# Patient Record
Sex: Female | Born: 1970 | Race: White | Hispanic: No | Marital: Married | Smoking: Never smoker
Health system: Southern US, Community
[De-identification: ages and names within clinical notes are randomized; demographics above are authoritative.]

## PROBLEM LIST (undated history)

## (undated) ENCOUNTER — Ambulatory Visit: Discharge status: Absent without leave | Payer: BC Managed Care – PPO

## (undated) DIAGNOSIS — E162 Hypoglycemia, unspecified: Secondary | ICD-10-CM

## (undated) DIAGNOSIS — E785 Hyperlipidemia, unspecified: Secondary | ICD-10-CM

## (undated) DIAGNOSIS — G4733 Obstructive sleep apnea (adult) (pediatric): Secondary | ICD-10-CM

## (undated) DIAGNOSIS — K219 Gastro-esophageal reflux disease without esophagitis: Secondary | ICD-10-CM

## (undated) DIAGNOSIS — D649 Anemia, unspecified: Secondary | ICD-10-CM

## (undated) DIAGNOSIS — Z9289 Personal history of other medical treatment: Secondary | ICD-10-CM

## (undated) DIAGNOSIS — F319 Bipolar disorder, unspecified: Secondary | ICD-10-CM

## (undated) DIAGNOSIS — F32A Depression, unspecified: Secondary | ICD-10-CM

## (undated) DIAGNOSIS — F419 Anxiety disorder, unspecified: Secondary | ICD-10-CM

## (undated) DIAGNOSIS — I10 Essential (primary) hypertension: Secondary | ICD-10-CM

## (undated) DIAGNOSIS — N611 Abscess of the breast and nipple: Secondary | ICD-10-CM

## (undated) DIAGNOSIS — F329 Major depressive disorder, single episode, unspecified: Secondary | ICD-10-CM

## (undated) DIAGNOSIS — F988 Other specified behavioral and emotional disorders with onset usually occurring in childhood and adolescence: Secondary | ICD-10-CM

## (undated) HISTORY — DX: Obstructive sleep apnea (adult) (pediatric): G47.33

## (undated) HISTORY — PX: ABDOMINOPLASTY: SUR9

## (undated) HISTORY — DX: Anxiety disorder, unspecified: F41.9

## (undated) HISTORY — PX: COLON SURGERY: SHX602

## (undated) HISTORY — PX: TUBAL LIGATION: SHX77

## (undated) HISTORY — PX: ABDOMINAL HYSTERECTOMY: SHX81

## (undated) HISTORY — DX: Essential (primary) hypertension: I10

## (undated) HISTORY — DX: Depression, unspecified: F32.A

## (undated) HISTORY — PX: WISDOM TOOTH EXTRACTION: SHX21

## (undated) HISTORY — DX: Bipolar disorder, unspecified: F31.9

## (undated) HISTORY — DX: Other specified behavioral and emotional disorders with onset usually occurring in childhood and adolescence: F98.8

## (undated) HISTORY — DX: Major depressive disorder, single episode, unspecified: F32.9

## (undated) HISTORY — DX: Hyperlipidemia, unspecified: E78.5

---

## 2001-06-28 ENCOUNTER — Emergency Department (HOSPITAL_COMMUNITY): Admission: EM | Admit: 2001-06-28 | Discharge: 2001-06-28 | Payer: Self-pay | Admitting: Emergency Medicine

## 2002-03-08 ENCOUNTER — Encounter: Payer: Self-pay | Admitting: Emergency Medicine

## 2002-03-08 ENCOUNTER — Emergency Department (HOSPITAL_COMMUNITY): Admission: EM | Admit: 2002-03-08 | Discharge: 2002-03-08 | Payer: Self-pay | Admitting: Emergency Medicine

## 2002-03-10 ENCOUNTER — Emergency Department (HOSPITAL_COMMUNITY): Admission: EM | Admit: 2002-03-10 | Discharge: 2002-03-10 | Payer: Self-pay | Admitting: Emergency Medicine

## 2002-03-21 ENCOUNTER — Ambulatory Visit (HOSPITAL_COMMUNITY): Admission: RE | Admit: 2002-03-21 | Discharge: 2002-03-21 | Payer: Self-pay | Admitting: Family Medicine

## 2003-04-11 ENCOUNTER — Emergency Department (HOSPITAL_COMMUNITY): Admission: EM | Admit: 2003-04-11 | Discharge: 2003-04-11 | Payer: Self-pay | Admitting: *Deleted

## 2003-04-11 ENCOUNTER — Encounter: Payer: Self-pay | Admitting: *Deleted

## 2003-05-25 ENCOUNTER — Inpatient Hospital Stay (HOSPITAL_COMMUNITY): Admission: EM | Admit: 2003-05-25 | Discharge: 2003-05-27 | Payer: Self-pay | Admitting: Emergency Medicine

## 2003-05-25 ENCOUNTER — Encounter: Payer: Self-pay | Admitting: Emergency Medicine

## 2005-08-24 IMAGING — CR DG CHEST 1V PORT
1 series · 2 of 2 positions shown · non-contrast
Comparison: None available.

CLINICAL DATA: 33-year-old with chest pain.  
 PORTABLE CHEST - 1 VIEW [DATE]:

[Series 1001: view not recorded · 0.20mm/px · 2 of 2 slices shown]
[im 1/2]
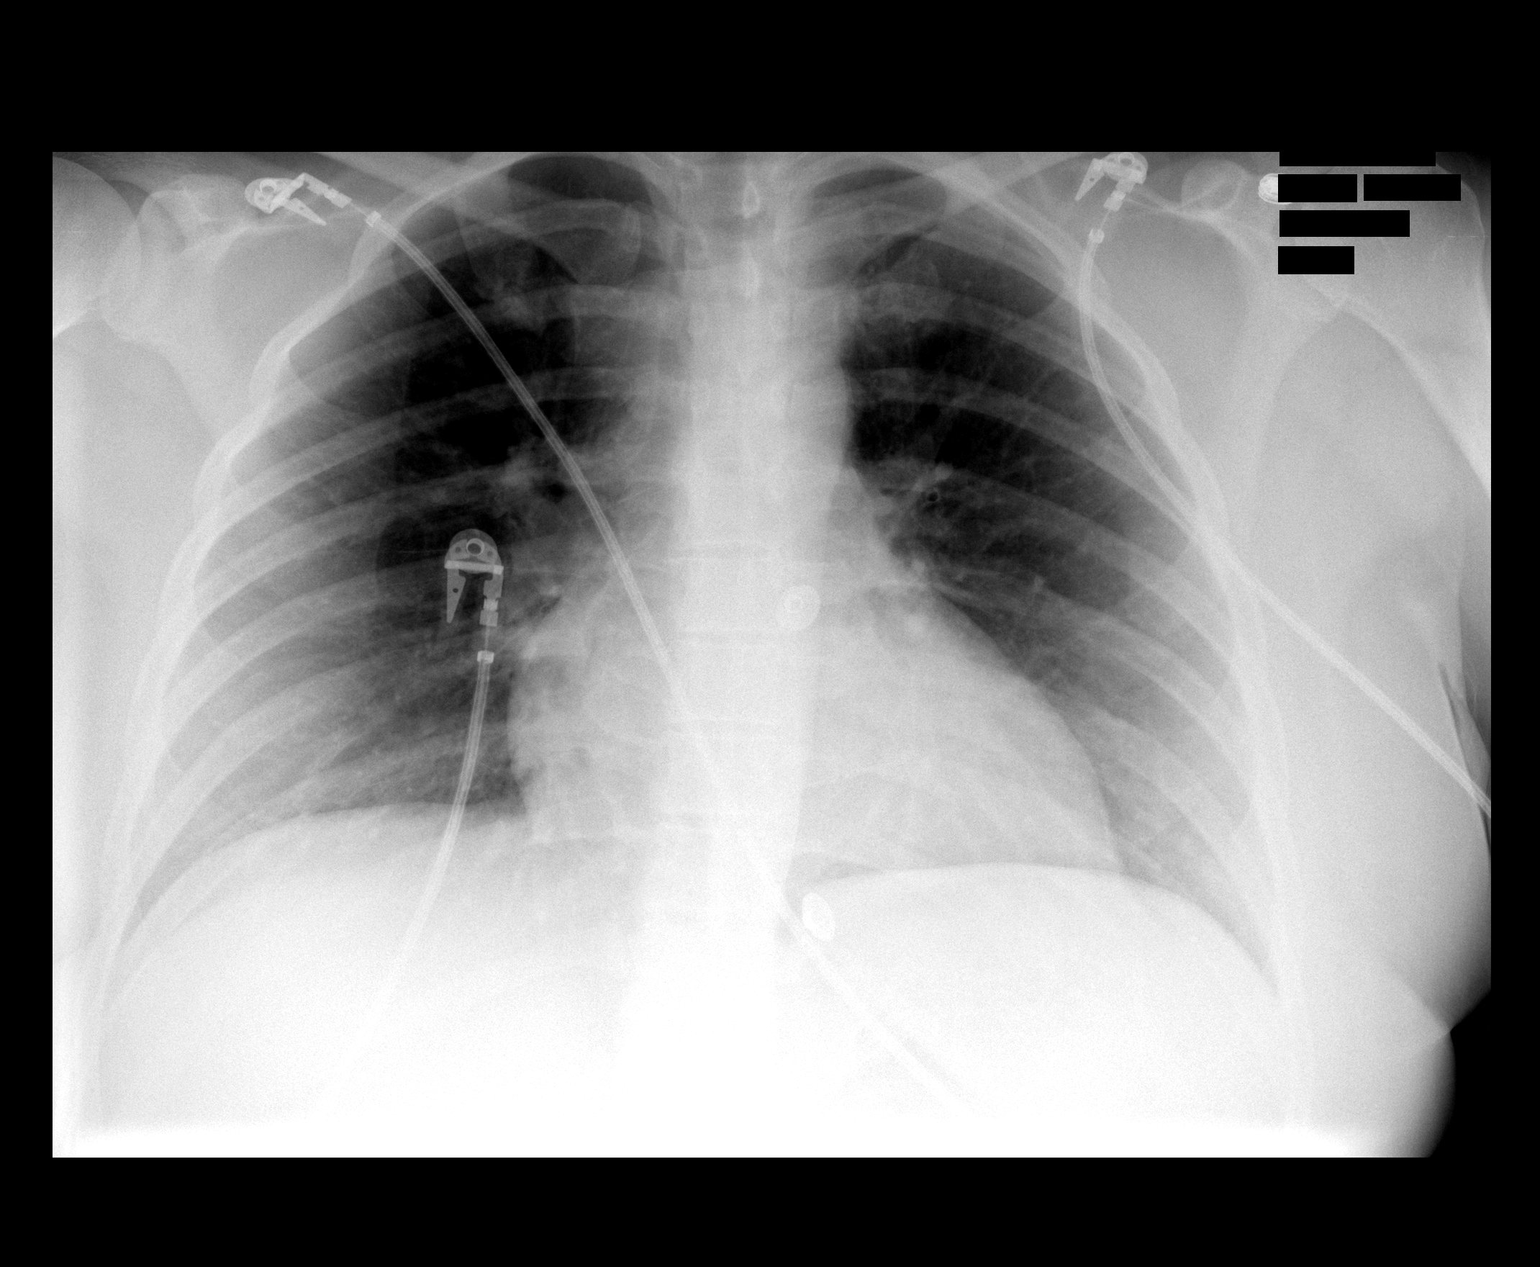
[im 2/2]
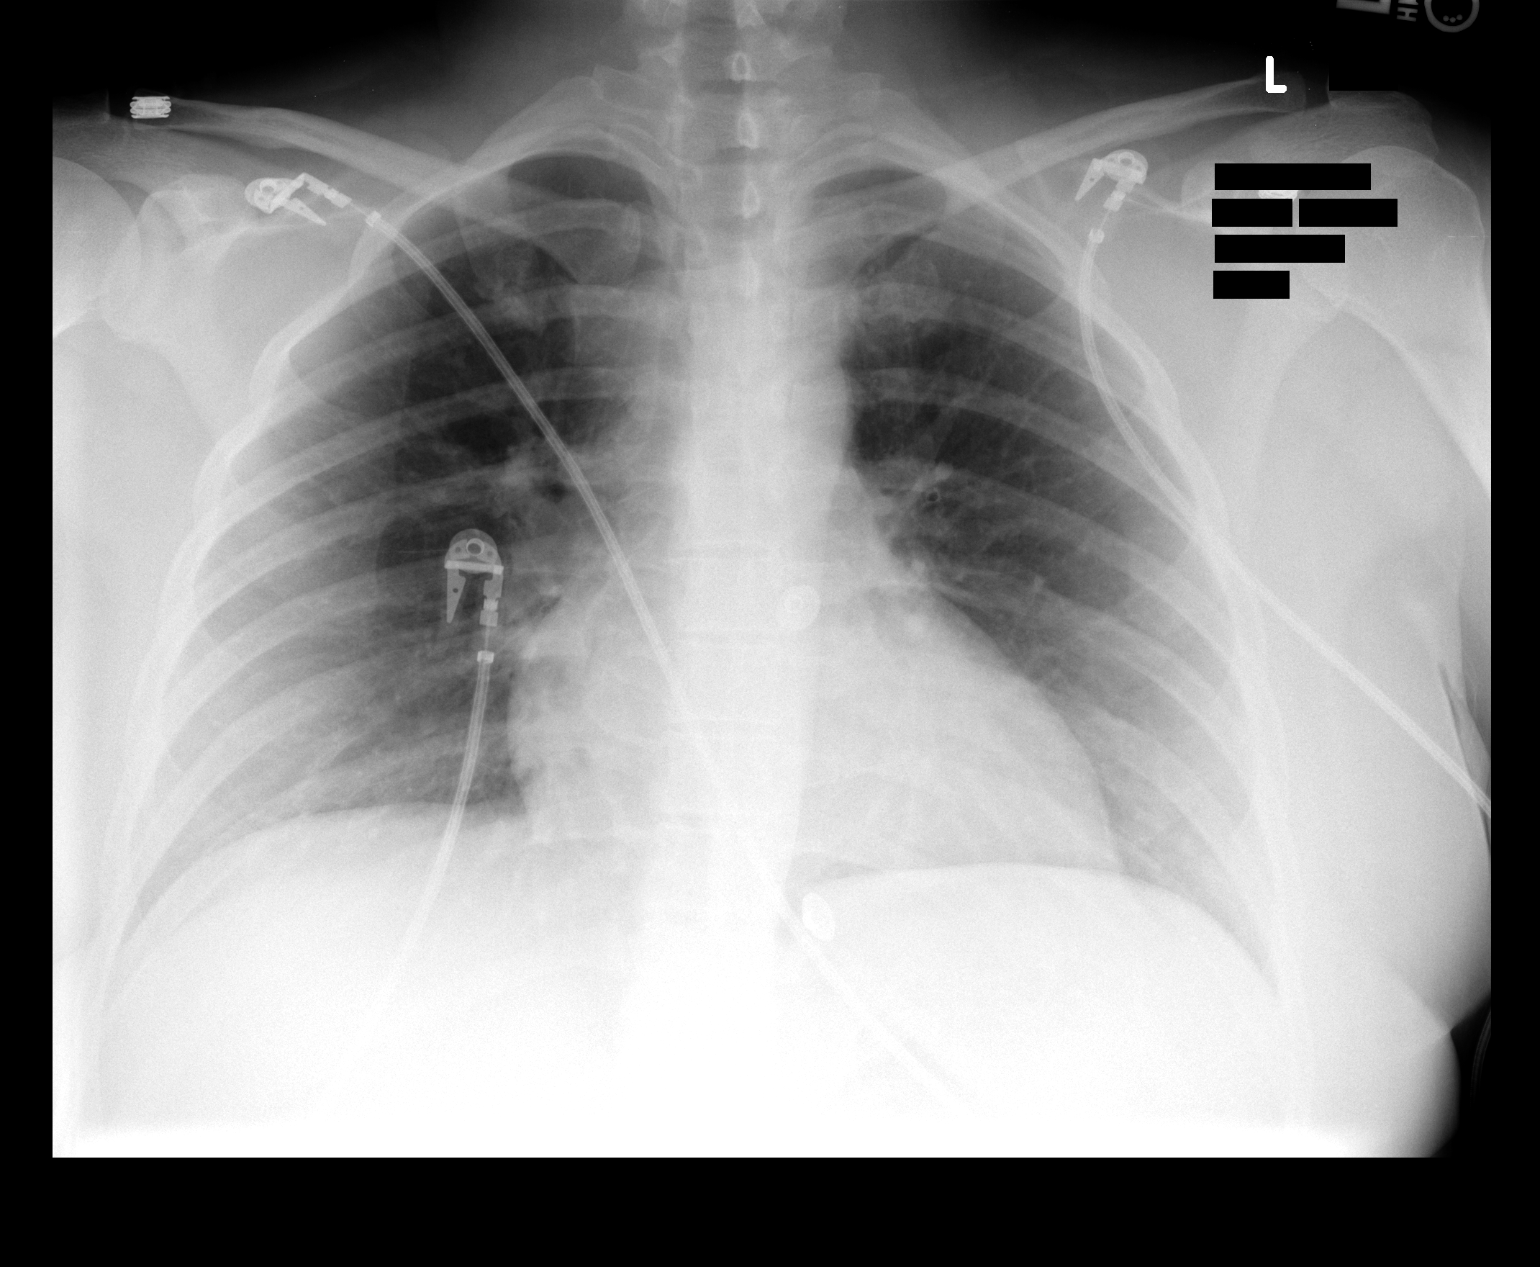

[2 of 2 positions shown; findings below may reference images not displayed]

The heart size and mediastinal contours are within normal limits.  Both lungs are clear.
IMPRESSION: No acute findings.

## 2005-08-25 ENCOUNTER — Observation Stay (HOSPITAL_COMMUNITY): Admission: EM | Admit: 2005-08-25 | Discharge: 2005-08-25 | Payer: Self-pay | Admitting: Emergency Medicine

## 2005-08-25 IMAGING — CT CT EXTREM LOW BILAT W/ CM
3 of 8 series · 15 of 33 positions shown, 18 images · IV contrast (omnipaque)
Comparison: none

CLINICAL DATA: Acute onset chest pain and shortness of breath.  Evaluate for pulmonary embolism and lower extremity DVT. 
 CT ANGIOGRAPHY OF CHEST:
TECHNIQUE: Multidetector CT imaging of the chest was performed during bolus injection of intravenous contrast.  Multiplanar CT angiographic image reconstructions were generated to evaluate the vascular anatomy.
 Contrast:  120 cc Omnipaque 300.
 Satisfactory opacification of the pulmonary arteries is seen and there is no evidence of pulmonary embolism.  There is no evidence of thoracic aortic aneurysm or dissection.  There is no evidence of hilar or mediastinal masses or adenopathy within the thorax. 
 There is no evidence of pleural effusion.  Minimal atelectasis is seen in the dependent lung bases but lungs are otherwise clear.
TECHNIQUE: Delayed CT images of the lower extremities were obtained after intravenous contrast administration from the level of the popliteal veins through the lower IVC, according to the protocol for evaluation for deep venous thrombosis.
 Satisfactory opacification of the deep veins is seen, and there is no evidence of acute DVT.

[Series 3: pe · axial · 0.70mm/px · z∈[-294,-90]mm · 7 of 437 slices shown, 9 images]
[im 55/437  soft-tissue]
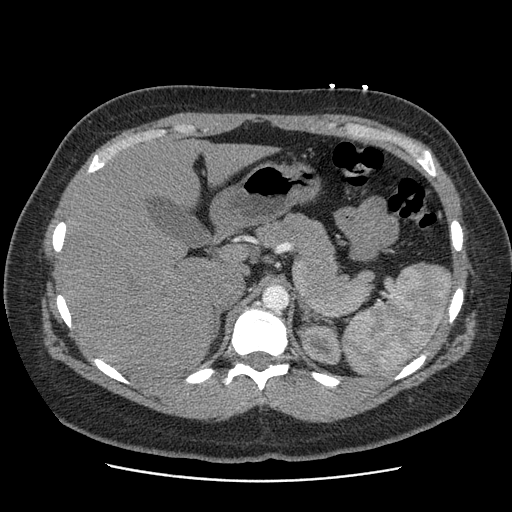
[im 55/437  bone]
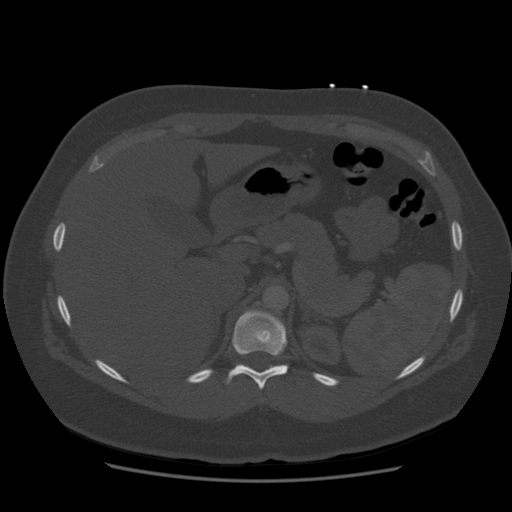
[im 110/437  bone]
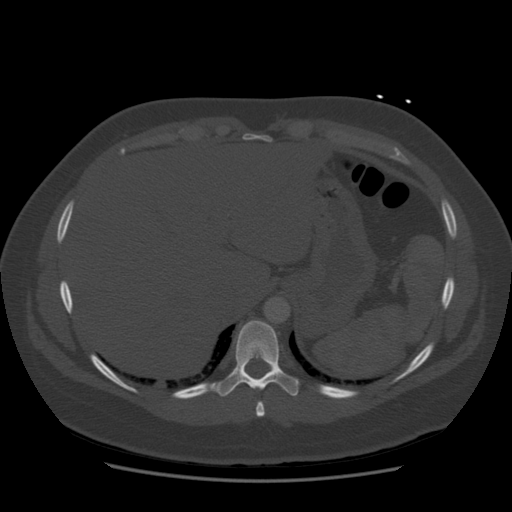
[im 164/437  bone]
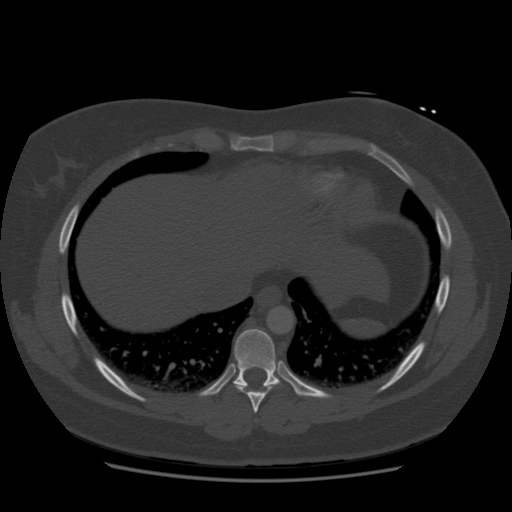
[im 219/437  bone]
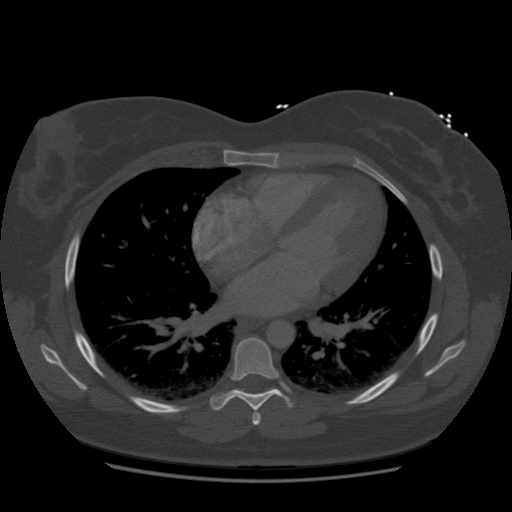
[im 273/437  soft-tissue]
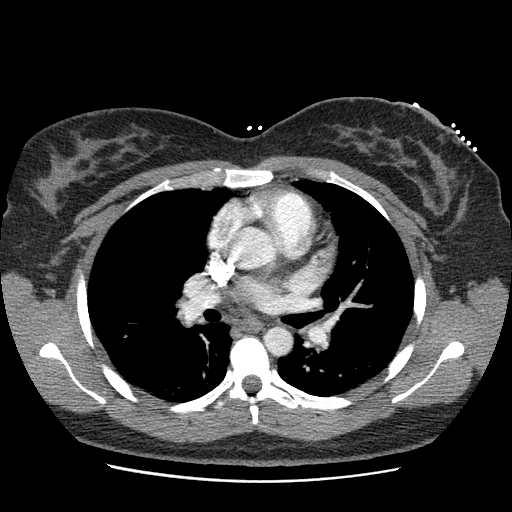
[im 273/437  bone]
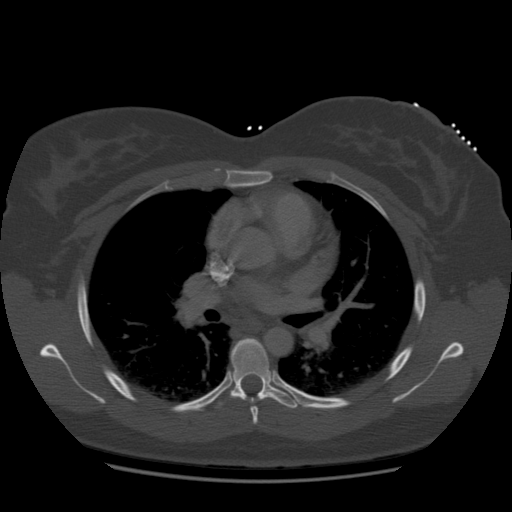
[im 328/437  bone]
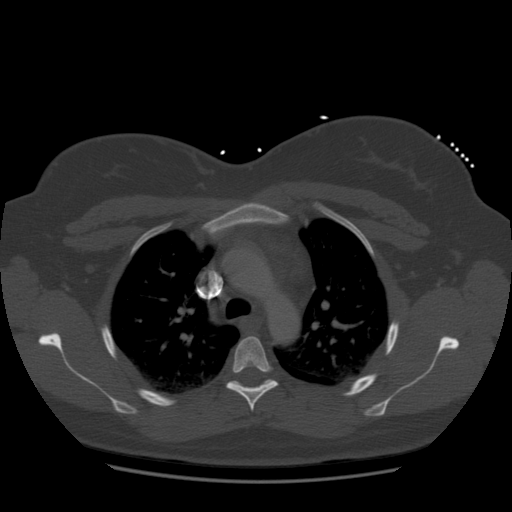
[im 382/437  bone]
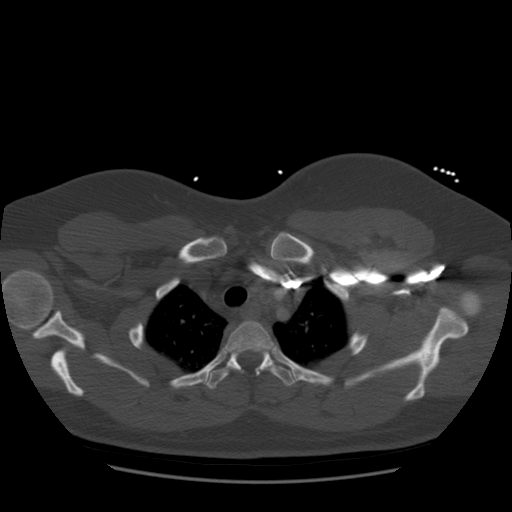

[Series 300: reformatted · coronal · 0.47mm/px · 3 of 128 slices shown (1 of 2)]
[im 81/128  bone]
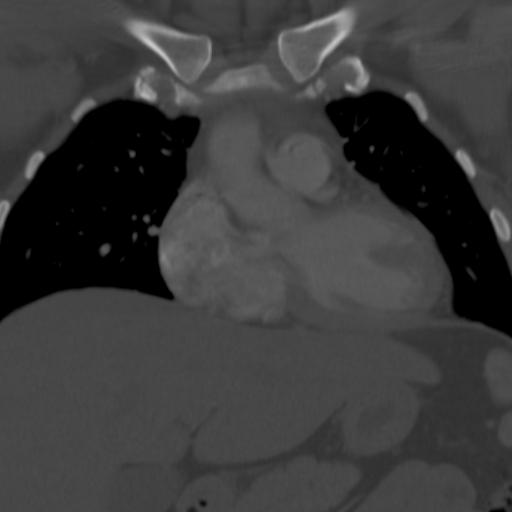
[im 92/128  bone]
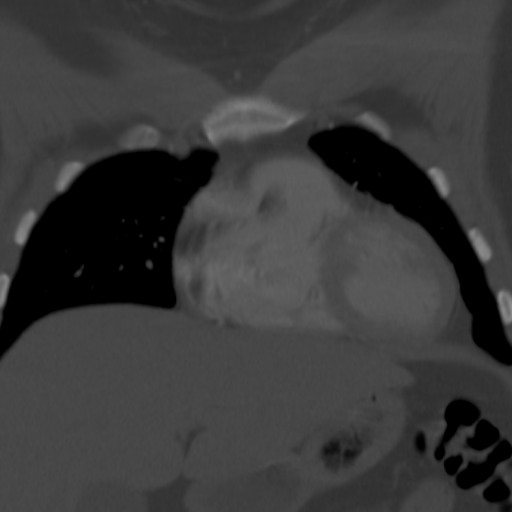
[im 104/128  bone]
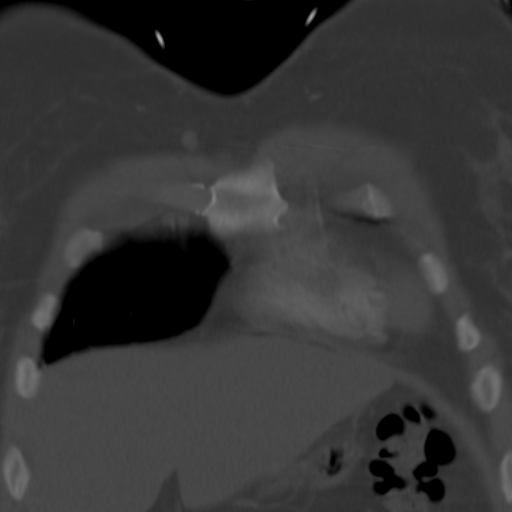

[Series 301: reformatted · sagittal · 0.47mm/px · 5 of 152 slices shown, 6 images (2 of 2)]
[im 51/152  bone]
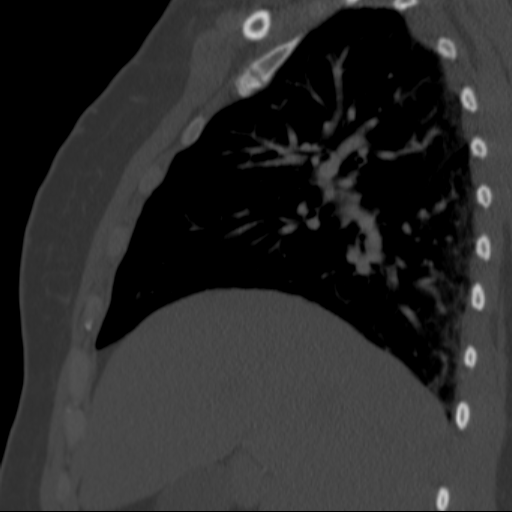
[im 63/152  bone]
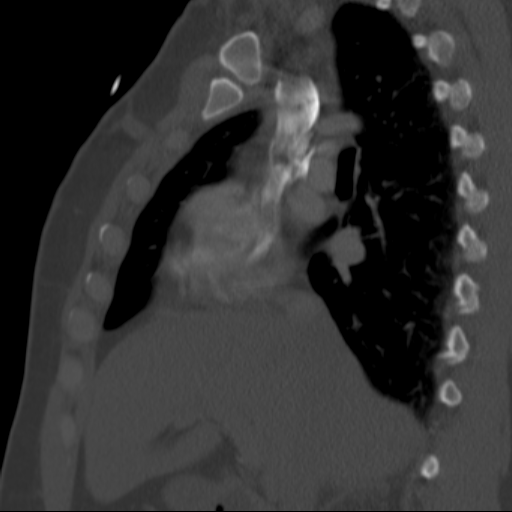
[im 76/152  soft-tissue]
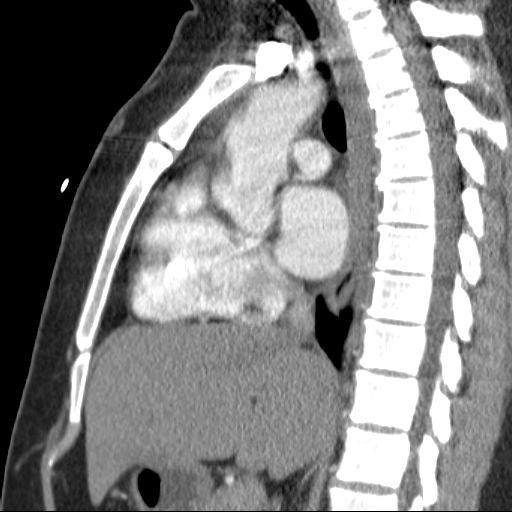
[im 76/152  bone]
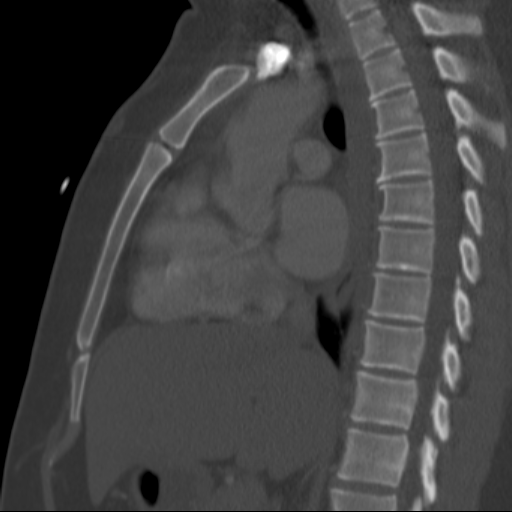
[im 89/152  bone]
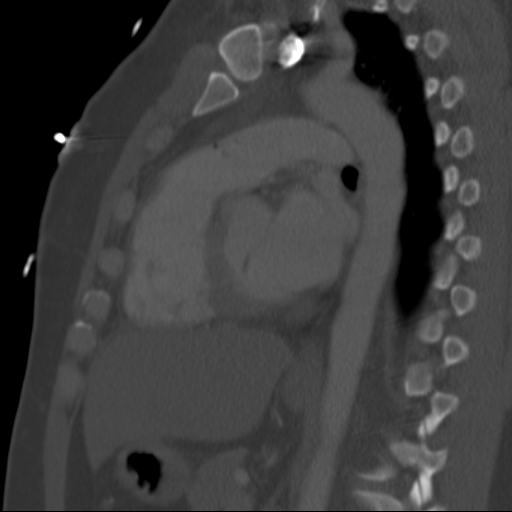
[im 101/152  bone]
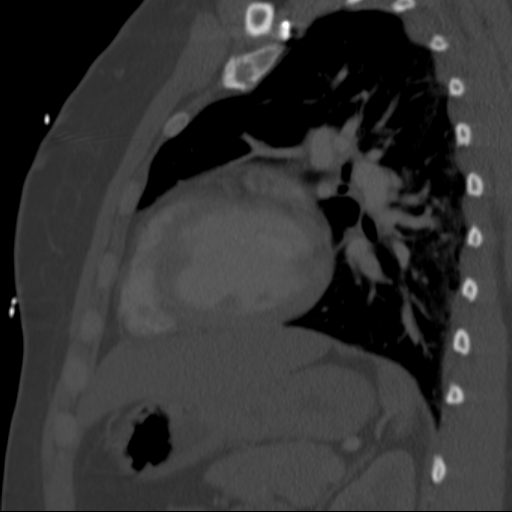

[15 of 33 positions shown; findings below may reference images not displayed]

IMPRESSION: Negative chest CTA.  No evidence of pulmonary embolism or other acute disease. 
 CT OF THE LOWER EXTREMITIES WITH CONTRAST - DVT PROTOCOL:
IMPRESSION: No evidence of acute DVT.

## 2005-12-13 ENCOUNTER — Emergency Department (HOSPITAL_COMMUNITY): Admission: EM | Admit: 2005-12-13 | Discharge: 2005-12-13 | Payer: Self-pay | Admitting: Emergency Medicine

## 2006-08-14 ENCOUNTER — Emergency Department (HOSPITAL_COMMUNITY): Admission: EM | Admit: 2006-08-14 | Discharge: 2006-08-14 | Payer: Self-pay | Admitting: Emergency Medicine

## 2006-08-31 ENCOUNTER — Inpatient Hospital Stay (HOSPITAL_COMMUNITY): Admission: AC | Admit: 2006-08-31 | Discharge: 2006-09-01 | Payer: Self-pay

## 2006-09-03 ENCOUNTER — Emergency Department (HOSPITAL_COMMUNITY): Admission: EM | Admit: 2006-09-03 | Discharge: 2006-09-03 | Payer: Self-pay | Admitting: Emergency Medicine
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
## 2007-11-08 ENCOUNTER — Emergency Department (HOSPITAL_COMMUNITY): Admission: EM | Admit: 2007-11-08 | Discharge: 2007-11-08 | Payer: Self-pay | Admitting: Emergency Medicine

## 2007-11-08 IMAGING — CR DG CERVICAL SPINE COMPLETE 4+V
7 series · 7 of 7 positions shown · non-contrast
Comparison: none

CLINICAL DATA: Back and neck pain. 
 CERVICAL SPINE SERIES ? 7 VIEW:

[view not recorded (1 of 7)]
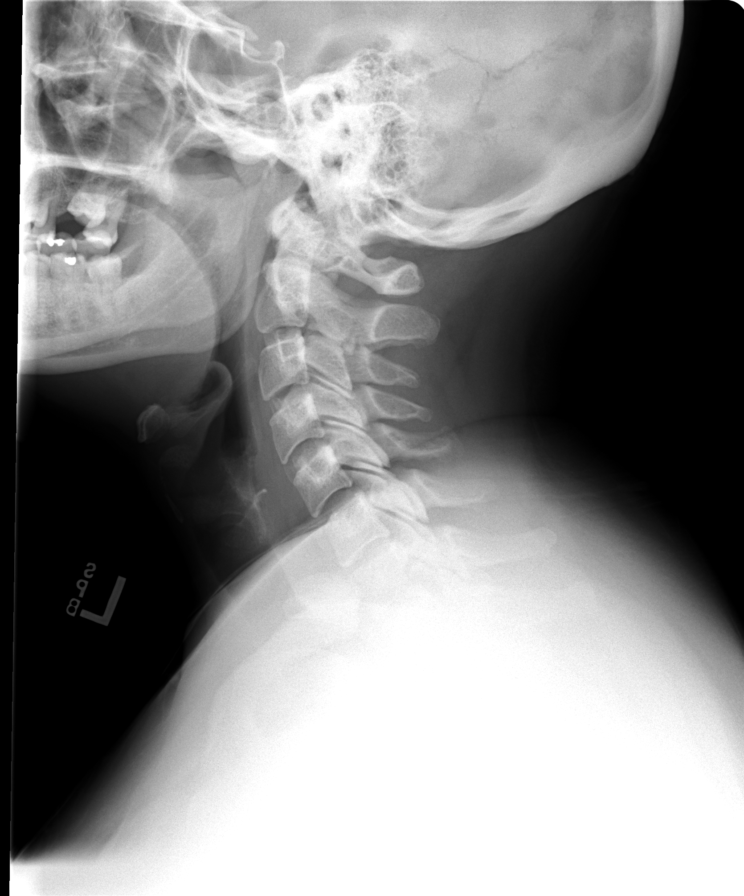

[view not recorded (2 of 7)]
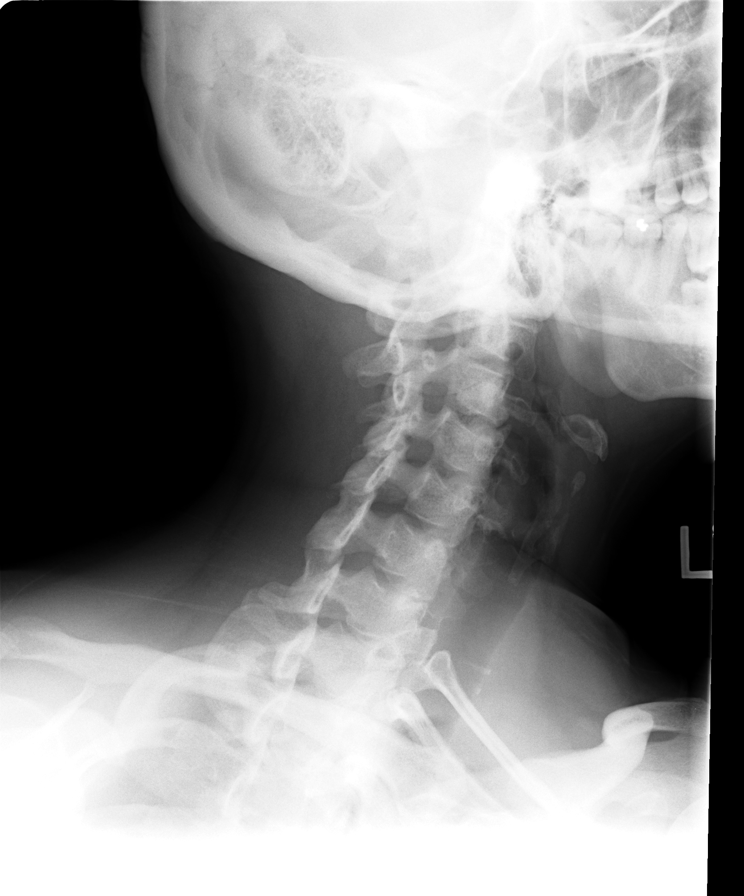

[view not recorded (3 of 7)]
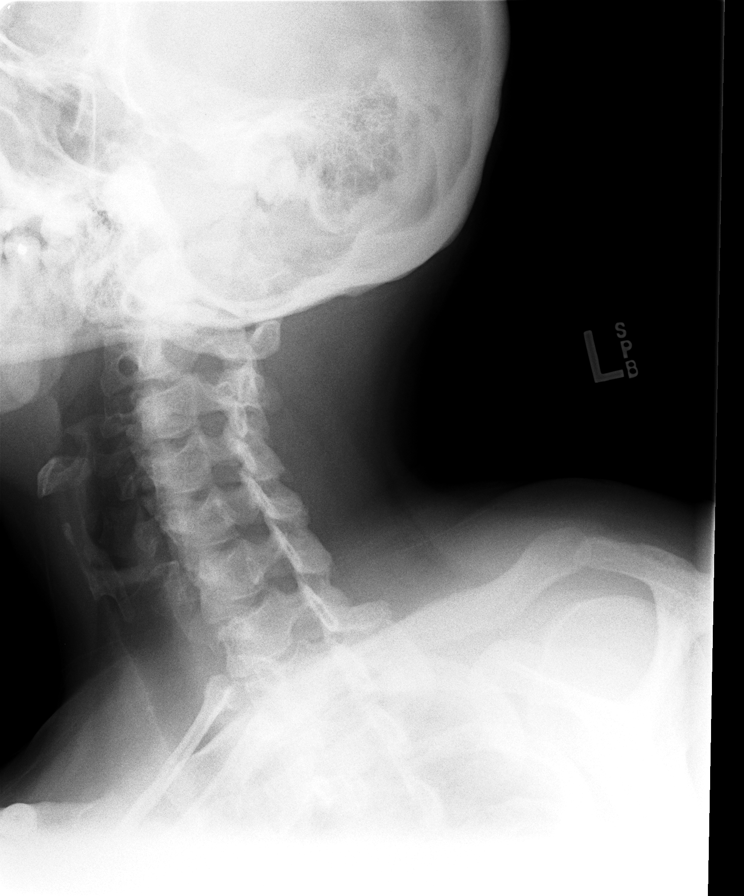

[view not recorded (4 of 7)]
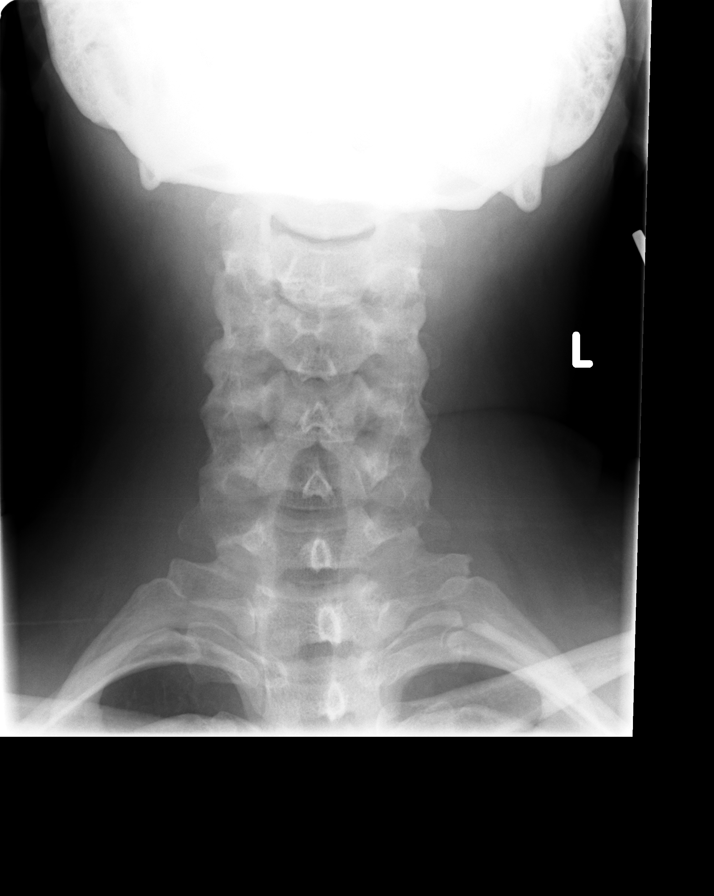

[view not recorded (5 of 7)]
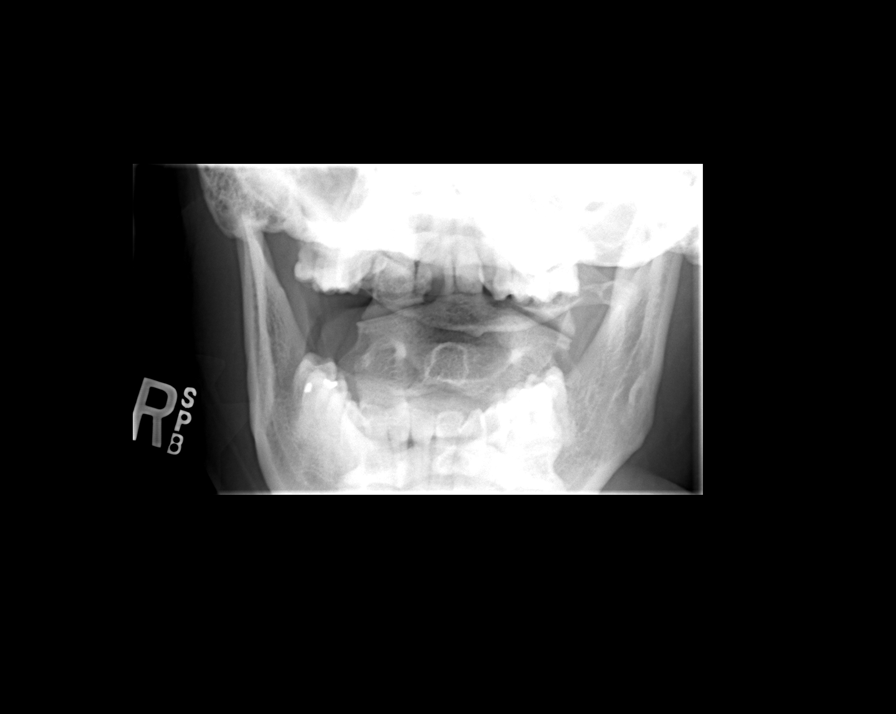

[view not recorded (6 of 7)]
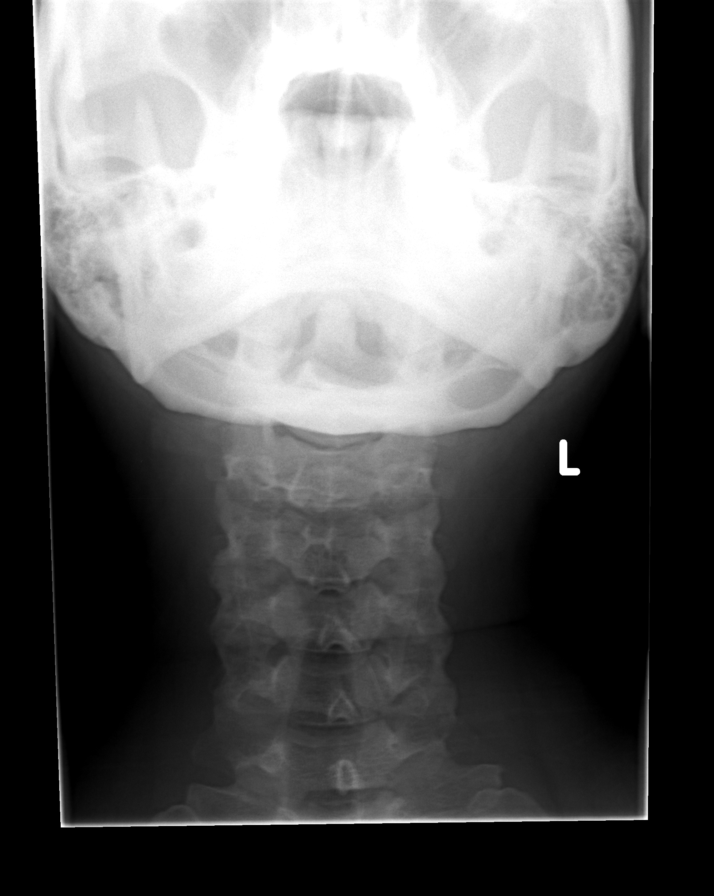

[view not recorded (7 of 7)]
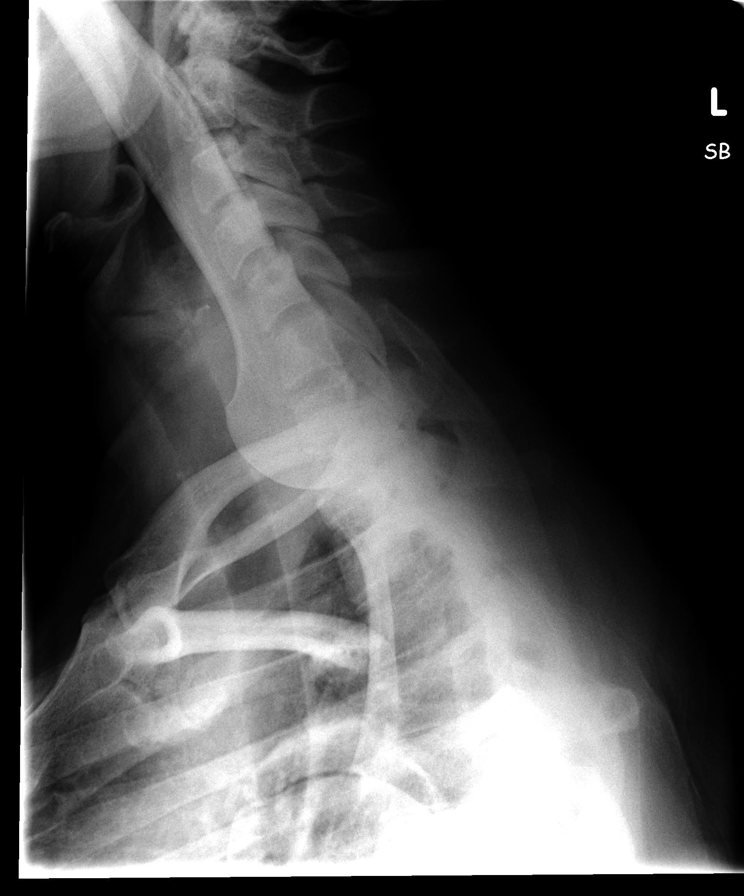

[7 of 7 positions shown; findings below may reference images not displayed]

FINDINGS: Vertebral body height and alignment are normal.  Prevertebral soft tissues are normal.  Lung apices are clear.
IMPRESSION: Negative study.

## 2011-01-25 NOTE — H&P (Signed)
NAMEMarland Kitchen  Theresa [name], Theresa [name] NO.:  [id number]   MEDICAL RECORD NO.:  [id number]          PATIENT TYPE:  EMS   LOCATION:  MAJO                         FACILITY:  [facility]   PHYSICIAN:  Verner Chol, MD DATE OF BIRTH:  [birth year]   DATE OF ADMISSION:  08/24/2005  DATE OF DISCHARGE:                                HISTORY & PHYSICAL   CHIEF COMPLAINT:  Chest pain.   HISTORY OF PRESENTING ILLNESS:  This is a 40 year old white female with  apparent history of hypertension who complains of a 1-day history of  substernal chest pressure and pain with occasional radiation to her back.  She states that it woke her from sleep yesterday and it has been rather  persistent since then. No real relation of this pain to exertion but she  does complain of occasional radiation to her left arm. She also noted  improvement after administration of sublingual nitroglycerin and morphine in  the emergency department today.   REVIEW OF SYSTEMS:  Significant for nausea and diaphoresis over the last  day. Also, positive left leg pain and swelling over the same time period.  The patient also admits to a headache during this time. She denies syncope  or presyncope. She denies dysuria. No change in bowel habits. No URI  symptoms.   PAST MEDICAL HISTORY:  1.  Significant for chest pain in 2004 at which time she had a      catheterization which failed to show coronary artery disease and had a      normal EF. Apparently she was diagnosed with hypertension at that time      and told to take a beta blocker but never did so.  2.  G4 P4-0-0-4. The patient had a C-section x1 and a VBAC x3.  3.  Status post BTL.  4.  History of gestational diabetes.  5.  Medications:  None.  6.  No known drug allergies.   FAMILY HISTORY:  Mother had an MI at age 78; also known to have  coagulopathy, hypertension, hypothyroidism, fibromyalgia, hyperlipidemia,  and breast cancer. Father with known MI at age 65 also  with diabetes and  hypertension. Younger brother with diabetes and hypertension.   SOCIAL HISTORY:  The patient is a single mother of four, works at [employer]. Is a nonsmoker, nondrinker, and denies other drug use.   PHYSICAL EXAMINATION:  VITAL SIGNS:  Blood pressure 335-456 systolic over 69-  93 diastolic, heart rate 25-63, temperature 98.0, respirations 20, at 99% O2  saturation on 2 L by nasal cannula.  GENERAL:  This is a well-developed, well-appearing Caucasian female in no  apparent distress. She is alert and oriented x3.  HEENT:  PERRL, EOMI, MMM. No lymphadenopathy in the head and neck.  Oropharynx is clear, nares are clear bilaterally. No JVD, no carotid bruits.  CHEST:  Lungs are clear to auscultation bilaterally. She has regular rate  and rhythm. No murmurs, rubs, or gallops. No CVA tenderness to palpation.  She does have minimal reproducible anterior chest wall tenderness to  palpation.  ABDOMEN:  Obese,  soft, nontender, nondistended, positive bowel sounds.  EXTREMITIES:  Noted trace swelling of the left leg to the ankle without any  swelling of the right leg. No tenderness to palpation over the calves. She  does have 2+ pedal and radial pulses bilaterally. She has 5/5 strength in  the upper and lower extremities bilaterally.  NEUROLOGIC:  Cranial nerves III-XII intact.  SKIN:  Intact. The patient does have several tattoos.   DATA:  White count 7.3, hemoglobin 13.1, platelets 213. Sodium 138,  potassium 3.2, chloride 105, bicarb 29, BUN 13, creatinine 1.1, glucose 86,  calcium 9.2. D-dimer less than 0.22. EKG shows normal sinus rhythm at a rate  of 90. Chest x-ray shows no acute disease.   ASSESSMENT AND PLAN:  This is a 40 year old white female with a strong  family history of heart disease and atypical chest pain.   1.  Chest pain. Again, the patient had a similar episode in 2004 at which      time she had a negative workup and was diagnosed with  hypertension but      never took her medications. Family history and female sex are her risk      factors. Her differential includes GERD, pulmonary embolus, pneumonia,      costochondritis, and anxiety. Her chest x-ray is clear, thus pneumonia      unlikely. She does have reproducible tenderness to palpation of the      chest wall and does admit to heartburn and thus will treat those      symptomatically with NSAIDs and Protonix respectively. She does not have      an anxious affect but will follow this. For the meantime we will rule      her out with serial enzymes and repeat EKG in the morning. Provide      morphine and nitroglycerin p.r.n. Treat empirically with a PPI. Again,      the patient not tachypneic or tachycardic and therefore pulmonary      embolus unlikely.  2.  Left leg pain and swelling concerning for a deep venous thrombosis given      the patient's family history of coagulopathy despite a negative D-dimer.      We will check lower extremities Dopplers.  3.  History of hypertension. Blood pressure currently controlled. No      medications for now.  4.  Disposition. The patient needs consistent primary care upon discharge.      Beatrix Fetters, MD    ______________________________  Verner Chol, MD    TD/MEDQ  D:  08/25/2005  T:  08/26/2005  Job:  [id number]

## 2011-01-25 NOTE — Discharge Summary (Signed)
   NAME:  Theresa [name], Theresa [name]                          ACCOUNT NO.:  [id number]   MEDICAL RECORD NO.:  [id number]                   PATIENT TYPE:  INP   LOCATION:  62                                 FACILITY:  [facility]   PHYSICIAN:  Vanetta Mulders. Dechurch, M.D.           DATE OF BIRTH:  [birth year]   DATE OF ADMISSION:  05/25/2003  DATE OF DISCHARGE:  05/27/2003                                 DISCHARGE SUMMARY   DIAGNOSES:  1. Chest pain.  2. Cardiac risk factors.   DISPOSITION:  The patient is transferred to [facility] to the  service of [facility] for further evaluation.   BRIEF HISTORY:  Briefly, the patient is a 40 year old female who presented  to the hospital with a history of hypertension currently not on any  medications and a positive family history for premature coronary artery  disease who had chest pain with exertion.   HOSPITAL COURSE:  She was seen in consultation by cardiology though her  enzymes were negative and there were no acute EKG changes.  It was felt that  her symptoms were worrisome enough for further evaluation and she was  transferred to [facility] for left heart catheterization.  Her  disposition will be per the attending physicians at [facility].  She is stable at the time of transfer to their facility.     ___________________________________________                                         Vanetta Mulders. Hillery Jacks, M.D.   FED/MEDQ  D:  06/14/2003  T:  06/14/2003  Job:  [id number]

## 2011-01-25 NOTE — Procedures (Signed)
NAME:  Theresa [name], Theresa [name]                          ACCOUNT NO.:  [id number]   MEDICAL RECORD NO.:  [id number]                   PATIENT TYPE:  INP   LOCATION:  5859                                 FACILITY:  [facility]   PHYSICIAN:  Leslye Peer, MD                    DATE OF BIRTH:  [birth year]   DATE OF PROCEDURE:  05/26/2003  DATE OF DISCHARGE:                                  ECHOCARDIOGRAM   INDICATIONS FOR PROCEDURE:  Ms. [name] is a 40 year old female who was  admitted with chest pain and has a positive family history of coronary  artery disease as well as she has a personal history of hypertension.   TECHNICAL QUALITY:  The technical quality of the study is adequate.   FINDINGS:  The aorta is within normal limits at 3.1 cm.   The left atrium is within normal limits at 3.4 cm. No obvious clots or  masses were appreciated. The patient appeared to be in sinus rhythm during  this procedure.   The interventricular septum and posterior wall were within normal limits in  thickness at 1.0 cm and 0.9 cm, respectively.   The aortic valve was not well visualized but is probably tri-leaflet. There  is no eccentric closure to aortic insufficiency to suggest otherwise. No  aortic insufficiency is noted. Doppler interrogation of the aortic valve is  within normal limits.   The mitral valve appears structurally normal, although it may be minimally  thickened on the distal portion of the anterior leaflet. Flat coaptation of  the leaflets is appreciated but no definite mitral valve prolapse is noted.  No significant mitral regurgitation is noted. No mitral annular  calcification is noted. Doppler interrogation of the mitral valve is within  normal limits.   The pulmonic valve is not well seen.   The tricuspid valve appears grossly structurally normal with trivial  tricuspid regurgitation noted.   The left ventricle is normal in size with the LVIDD measured at 4.4 cm and  the LVISD measured at  3.4 cm. Overall left ventricular systolic function  appears normal and no regional wall motion abnormalities are appreciated.  There is a normal mitral inflow pattern. The right atrium and right  ventricle appeared grossly normal in size and function. No pericardial  effusion is appreciated.   IMPRESSION:  1. Normal chamber sizes.  2. Normal valvular function.  3. Flat coaptation of the mitral valve leaflets is noted but no definite     mitral valve prolapse is appreciated.  4. Normal left ventricular size and systolic function.  5.     No regional wall motion abnormalities noted.  6. No pericardial effusion is appreciated.  7. Trivial tricuspid regurgitation.  Leslye Peer, MD    AB/MEDQ  D:  05/26/2003  T:  05/26/2003  Job:  [id number]   cc:   Vanetta Mulders. Dechurch, M.D.  [address]  [place] [address]  Fax: [phone]

## 2011-01-25 NOTE — Discharge Summary (Signed)
NAME:  RHILYNN, PREYER                          ACCOUNT NO.:  [id number]   MEDICAL RECORD NO.:  [id number]                   PATIENT TYPE:  INP   LOCATION:  5427                                 FACILITY:  [facility]   PHYSICIAN:  Leslye Peer, MD                    DATE OF BIRTH:  12/26/1970   DATE OF ADMISSION:  05/26/2003  DATE OF DISCHARGE:  05/27/2003                                 DISCHARGE SUMMARY   ADMISSION DIAGNOSES:  1. Hypertension.  2. Chest pain.  3. Positive family history risk factors.   DISCHARGE DIAGNOSES:  1. Noncoronary chest pain.  2. Hypertension.  3. Gastroesophageal reflux disease/dysphagia.   PROCEDURE:  Cardiac catheterization May 26, 2003.   BRIEF HISTORY:  The patient is a 40 year old white female who presented to  the emergency room at [facility] with history of hypertension and  chest discomfort.  She has a positive family history of premature coronary  artery disease.  She complained of chest pain with exertion.  She was seen  by Leslye Peer, MD.  CK and troponins were negative and she was transferred  to [facility] for cardiac catheterization for definitive diagnosis  of angina.   PAST MEDICAL HISTORY:  Hypertension.  She was previously taking a  medication, but quit one and a half years ago secondary to cost and  financial issues.   ALLERGIES:  None known.   HABITS:  Cigarettes:  None.  ETOH:  None.   HOSPITAL COURSE:  The patient was admitted.  She was seen by Shelva Majestic,  M.D.  He noted she has a family history of premature coronary artery  disease, hypertension, hyperlipidemia.  He agreed with plan for cardiac  catheterization.  Cardiac catheterization was done on May 26, 2003.  There was no coronary artery disease.  All coronary arteries were normal and  her ejection fraction was normal.  She was returned to the room and was  stable overnight.  The following day creatinine was 1.0.  Hemoglobin was 13  with  hematocrit of 37.  White count was 6.2.  Platelets were 191,000.  It  was Leslye Peer, MD opinion at that point that patient could be discharged  home.  She was scheduled to follow up with Leslye Peer, MD on June 23, 2003.   DISCHARGE MEDICATIONS:  1. Toprol XL 25 mg daily.  2.     Protonix 40 mg daily.  3. Ecotrin one daily.   CONDITION ON DISCHARGE:  Improving.      Lydia Guiles, P.A.                 Leslye Peer, MD    WDJ/MEDQ  D:  06/30/2003  T:  06/30/2003  Job:  [id number]   cc:   Vanetta Mulders. Dechurch, M.D.  [address]  [place] [address]  Fax:  [phone]  

## 2011-01-25 NOTE — Cardiovascular Report (Signed)
NAMEMarland Kitchen  Theresa, [name] NO.:  [id number]   MEDICAL RECORD NO.:  [id number]          PATIENT TYPE:  INP   LOCATION:  3329                         FACILITY:  [facility]   PHYSICIAN:  Verner Chol, MD DATE OF BIRTH:  05-19-1971   DATE OF PROCEDURE:  DATE OF DISCHARGE:  08/25/2005                              CARDIAC CATHETERIZATION   CONSULTATIONS:  None.   DIAGNOSES:  1.  Chest pain.  2.  Family history of myocardial infarction in the 62s.  3.  Anemia.  4.  Hypertension.  5.  Obesity.   MEDICATIONS:  1.  Toprol XL 50 mg daily.  2.  Protonix 40 mg daily.   PROCEDURES:  1.  CTA and CT of the legs on August 25, 2005, showing no evidence of PE      or DVT.  2.  EKG on August 25, 2005, normal sinus rhythm x2.   PERTINENT LABORATORY DATA:  Creatinine 1.0.  TSH 2.118. Cardiac enzymes  normal.  D-dimer negative, less than 0.22.  Hemoglobin 11.9.  Fasting lipid  profile was pending at the time of discharge.   HOSPITAL COURSE:  Patient was admitted on August 25, 2005, with chest pain  which was typical in nature.  She denies history of smoking and she is  obese.  She does have family history of MI in her 40s with her mother.  Patient claims to have had a cath done a year or two ago and was started on  Toprol XL at that time but patient had not taken that medication and patient  had never followed up with either her or her primary care physician.  Her  EKG showed normal sinus rhythm.  Her work-up was negative for MI with  cardiac enzymes negative.  No change in her EKG in the morning and no  evidence of pulmonary embolus on CT angiogram.  TSH was normal at 2.118 as  well.  It is assumed that this chest pain is either reflux, musculoskeletal  or anxiety or combination of the three.   FOLLOW UP:  Patient will follow up at the [facility] as soon as  possible.  She will call for appointment.   DIAGNOSES:  1.  Chest pain:  As stated above, this is  reflux versus anxiety versus      musculoskeletal.  Patient will treat it symptomatically with Tylenol      and/or Motrin.  She also has Protonix 40 mg daily written for.  2.  Anemia:  Most likely this is chronic iron-deficiency anemia related to      woman of child-bearing age.  Consider work-up versus iron prescription      in the outpatient.  3.  Hypertension:  The patient is on Toprol XL 50 mg daily.  4.  Obesity:  The patient may benefit from a nutrition consult and      aggressive outpatient counseling for weight loss/exercise.      Druscilla Brownie, M.D.    ______________________________  Verner Chol, MD    AL/MEDQ  D:  08/25/2005  T:  08/27/2005  Job:  [id number]   cc:   [place]

## 2011-01-25 NOTE — H&P (Signed)
NAME:  Theresa [name], Theresa [name]                          ACCOUNT NO.:  [id number]   MEDICAL RECORD NO.:  [id number]                   PATIENT TYPE:  INP   LOCATION:  A201                                 FACILITY:  [facility]   PHYSICIAN:  Vanetta Mulders. Dechurch, M.D.           DATE OF BIRTH:  1970/11/26   DATE OF ADMISSION:  05/25/2003  DATE OF DISCHARGE:                                HISTORY & PHYSICAL   HISTORY OF PRESENT ILLNESS:  A 40 year old Caucasian female with a past  medical history remarkable for hypertension for which she has been on no  meds for the last year or so who presents with a 2-week history of chest  pain which was initially intermittent in nature then what she described as a  constant tightness, over the past several days it has been more pronounced.  Last night it became much more intense.  She was able to get to sleep but  awakened this morning with a similar pain that progressed through the day.  She had some diaphoresis.  She has noted some decreased exercise  tolerance/exertional exacerbation of the pain over the past 2 weeks as well.  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  She has no GI complaints, in fact she took  multiple over-the-counter remedies including Zantac and Tums to no avail.  She treated herself with aspirin yesterday and went to bed and then again  awakened with the symptoms this morning.  Today she notes that there is pain  radiating into her left inner arm, she notes palpitations as well and the  ongoing heaviness.  In the emergency room she received nitroglycerin which  did not affect the pain to much degree.  She was also given Toradol which  actually brought it from an 8 to a 4.  She has constant pain at this time  though she appears quite comfortable.  She was noted to be hypertensive upon  presentation with a blood pressure initially of 149/93.  She has been told  by her CNA instructor  over the past several nights that her blood pressure  has been elevated with diastolics upwards in the 097-353 region and  systolics in the 299-242.  The patient also received a GI cocktail in the  emergency room which also had no significant effect.  She has just had 2 mg  of morphine but we do not yet know if that has had any positive effect or  not.  She is on no medications at this time; no over-the-counter medications  of any significance.  She previously was on Corgard.  The patient notes she  has been told her cholesterol was nothing to worry about.  Family medical  history is pertinent for mother having an MI at age 71 though she also has  comorbidities of obesity and hypertension, apparently grandparents in their  37s and 70s have also had massive MIs, she has a brother who is younger with  hypertension and father with hypertension as well.   SOCIAL HISTORY:  She is currently living with her boyfriend of 1 year.  No  alcohol or tobacco abuse.  She denies any illicit drug use with the  exception of occasional marijuana.  She is currently in CNA training and a  Counsellor.  She has four children ages 35-12 I think.   REVIEW OF SYSTEMS:  No GI/GU complaints.  She denies any anxiety or  depression symptoms.  Occasional headache; remote history of migraines but  none recently.  Regular menses.  No weight changes.  She has a history of a  right hip fracture but this has not caused any sequelae.   PAST MEDICAL HISTORY:  Gravida 4, para 4, AB 0, c-sections, tubal ligation,  pelvic fracture with reinjury about a year ago and history of hypertension  currently not treated.   PHYSICAL EXAMINATION:  GENERAL:  Slightly overweight well-developed, well-  nourished alert pleasant patient in no distress.  VITAL SIGNS:  Blood pressure 149/93, pulse in the 70s and regular.  HEENT:  Oropharynx is moist.  Teeth are in good repair.  NECK:  Supple; there is no JVD or adenopathy; no  thyromegaly; no bruits.  HEART:  Regular rate and rhythm; no murmur, gallop, or rub.  ABDOMEN:  Slightly obese, soft; no bruits; no HJR; no hepatosplenomegaly.  EXTREMITIES:  Without clubbing, cyanosis, or edema.  She has good distal  pulses both in the upper extremities and lower extremities.  NEUROLOGIC:  Exam completely intact.   LABORATORY DATA:  Three normal EKGs.  Chest x-ray read as normal.  Initial  CK is 90, MB of 1, troponin 0.01.  CBC is normal.  BNP normal.  No liver  functions have been performed.   ASSESSMENT AND PLAN:  A 40 year old with risk factors of pertinent family  history and hypertension which is not treated with recent symptoms which are  concerning though somewhat atypical in nature for evidence of ischemic  disease.  She will be admitted with serial enzymes and echocardiogram given  her history of hypertension to assess her left ventricle and we will consult  cardiology as this patient is going to need outpatient followup.  Given her  issues stress Cardiolite will be performed unless of course her enzymes are  positive.  She has received aspirin in the emergency room which will  continue and add Lopressor at low dose to the regimen.  I am going to avoid  Lovenox at this point.  As far as the hypertension is concerned, Lopressor  will have some positive effects and we will begin hydrochlorothiazide as  well.  She is going to need a regimen that will be affordable in this  patient as well as tolerable. We will check TSH and lipid profile as well.  The risks and benefits are discussed with the patient, has good  understanding and agrees to the plan.                                               Vanetta Mulders Hillery Jacks, M.D.    FED/MEDQ  D:  05/25/2003  T:  05/25/2003  Job:  [id number]

## 2011-01-28 ENCOUNTER — Ambulatory Visit (HOSPITAL_COMMUNITY): Payer: Self-pay | Admitting: Psychiatry

## 2012-12-17 ENCOUNTER — Other Ambulatory Visit: Payer: Self-pay | Admitting: *Deleted

## 2012-12-17 MED ORDER — LAMOTRIGINE 150 MG PO TABS: 150.0000 mg | ORAL_TABLET | Freq: Every day | ORAL | Status: DC

## 2012-12-17 MED ORDER — LISINOPRIL 20 MG PO TABS: 20.0000 mg | ORAL_TABLET | Freq: Every day | ORAL | Status: DC

## 2012-12-17 NOTE — Telephone Encounter (Signed)
crestor samples

## 2012-12-17 NOTE — Telephone Encounter (Signed)
CRESTOR SAMPLES

## 2012-12-17 NOTE — Telephone Encounter (Signed)
Forgot to route previous quick note

## 2012-12-17 NOTE — Telephone Encounter (Signed)
Ok for [employer]

## 2012-12-17 NOTE — Telephone Encounter (Signed)
Ok for crestor samples. Put lisinopril Rx in bag with samples

## 2012-12-18 NOTE — Telephone Encounter (Signed)
SAMPLES GIVEN AND PT AWARE

## 2012-12-22 ENCOUNTER — Other Ambulatory Visit: Payer: Self-pay | Admitting: Nurse Practitioner

## 2013-01-05 ENCOUNTER — Encounter: Payer: Self-pay | Admitting: Nurse Practitioner

## 2013-01-05 ENCOUNTER — Ambulatory Visit (INDEPENDENT_AMBULATORY_CARE_PROVIDER_SITE_OTHER): Payer: BC Managed Care – PPO | Admitting: Nurse Practitioner

## 2013-01-05 VITALS — BP 109/75 | HR 96 | Temp 97.5°F | Ht 64.0 in | Wt 218.0 lb

## 2013-01-05 DIAGNOSIS — D3500 Benign neoplasm of unspecified adrenal gland: Secondary | ICD-10-CM | POA: Insufficient documentation

## 2013-01-05 DIAGNOSIS — F3162 Bipolar disorder, current episode mixed, moderate: Secondary | ICD-10-CM | POA: Insufficient documentation

## 2013-01-05 DIAGNOSIS — F411 Generalized anxiety disorder: Secondary | ICD-10-CM

## 2013-01-05 DIAGNOSIS — R609 Edema, unspecified: Secondary | ICD-10-CM

## 2013-01-05 DIAGNOSIS — D3502 Benign neoplasm of left adrenal gland: Secondary | ICD-10-CM

## 2013-01-05 DIAGNOSIS — Z01419 Encounter for gynecological examination (general) (routine) without abnormal findings: Secondary | ICD-10-CM

## 2013-01-05 DIAGNOSIS — E785 Hyperlipidemia, unspecified: Secondary | ICD-10-CM

## 2013-01-05 DIAGNOSIS — I10 Essential (primary) hypertension: Secondary | ICD-10-CM

## 2013-01-05 DIAGNOSIS — Z0000 Encounter for general adult medical examination without abnormal findings: Secondary | ICD-10-CM

## 2013-01-05 LAB — POCT URINALYSIS DIPSTICK
Bilirubin, UA: NEGATIVE
Glucose, UA: NEGATIVE
Ketones, UA: NEGATIVE
Nitrite, UA: NEGATIVE
Protein, UA: NEGATIVE
Spec Grav, UA: 1.01
pH, UA: 5

## 2013-01-05 LAB — COMPLETE METABOLIC PANEL WITH GFR
ALT: 76 U/L — ABNORMAL HIGH (ref 0–35)
Albumin: 4.8 g/dL (ref 3.5–5.2)
Calcium: 9.7 mg/dL (ref 8.4–10.5)
Creat: 1.08 mg/dL (ref 0.50–1.10)
GFR, Est African American: 75 mL/min
Potassium: 4.6 mEq/L (ref 3.5–5.3)
Total Protein: 7.1 g/dL (ref 6.0–8.3)

## 2013-01-05 LAB — POCT CBC
HCT, POC: 37.3 % — AB (ref 37.7–47.9)
Hemoglobin: 12.4 g/dL (ref 12.2–16.2)
MCH, POC: 26 pg — AB (ref 27–31.2)
MCV: 78.3 fL — AB (ref 80–97)
POC Granulocyte: 4.8 (ref 2–6.9)
POC LYMPH PERCENT: 29.4 %L (ref 10–50)
Platelet Count, POC: 277 10*3/uL (ref 142–424)
RBC: 4.8 M/uL (ref 4.04–5.48)
RDW, POC: 16.8 %
WBC: 7.5 10*3/uL (ref 4.6–10.2)

## 2013-01-05 LAB — POCT UA - MICROSCOPIC ONLY
Casts, Ur, LPF, POC: NEGATIVE
Crystals, Ur, HPF, POC: NEGATIVE
Mucus, UA: NEGATIVE

## 2013-01-05 LAB — THYROID PANEL WITH TSH: Free Thyroxine Index: 2.5 (ref 1.0–3.9)

## 2013-01-05 MED ORDER — FUROSEMIDE 40 MG PO TABS: 40.0000 mg | ORAL_TABLET | ORAL | Status: DC | PRN

## 2013-01-05 MED ORDER — ALPRAZOLAM 1 MG PO TABS: 1.0000 mg | ORAL_TABLET | Freq: Two times a day (BID) | ORAL | Status: DC | PRN

## 2013-01-05 MED ORDER — ROSUVASTATIN CALCIUM 10 MG PO TABS: 10.0000 mg | ORAL_TABLET | Freq: Every day | ORAL | Status: DC

## 2013-01-05 MED ORDER — LAMOTRIGINE 150 MG PO TABS: 150.0000 mg | ORAL_TABLET | Freq: Every day | ORAL | Status: DC

## 2013-01-05 MED ORDER — LISINOPRIL 20 MG PO TABS: 20.0000 mg | ORAL_TABLET | Freq: Every day | ORAL | Status: DC

## 2013-01-05 NOTE — Progress Notes (Signed)
Subjective:    Patient ID: Theresa [name], female    DOB: 1971/05/31, 42 y.o.   MRN: [id number]  Patient here today for CPE and PAP. Current medical problems: Hyperlipidemia This is a chronic problem. The current episode started more than 1 year ago. The problem is controlled. Recent lipid tests were reviewed and are normal. Exacerbating diseases include obesity. She has no history of diabetes or hypothyroidism. There are no known factors aggravating her hyperlipidemia. Pertinent negatives include no focal sensory loss, focal weakness, leg pain or myalgias. Current antihyperlipidemic treatment includes statins. The current treatment provides significant improvement of lipids. Compliance problems include adherence to diet and adherence to exercise.  Risk factors for coronary artery disease include obesity.  peripheral edema Takes lasix 2-3 times per week. Elevates legs when bad. Usually resolves by AM daily. Bipolar Doing well. Feels under control. Pheochromocytoma Lisinopril working well. B/P under control. GAD Xanax prn. Doing well.    Review of Systems  Constitutional: Negative.   HENT: Negative.   Eyes: Negative.   Respiratory: Negative.   Cardiovascular: Negative.   Gastrointestinal: Negative.   Genitourinary: Negative.   Musculoskeletal: Negative for myalgias.  Neurological: Negative.  Negative for focal weakness.  Psychiatric/Behavioral: Negative.        Objective:   Physical Exam  Constitutional: She is oriented to person, place, and time. She appears well-developed and well-nourished.  HENT:  Head: Normocephalic.  Right Ear: Hearing, tympanic membrane, external ear and ear canal normal.  Left Ear: Hearing, tympanic membrane, external ear and ear canal normal.  Nose: Nose normal.  Mouth/Throat: Uvula is midline and oropharynx is clear and moist.  Eyes: Conjunctivae and EOM are normal. Pupils are equal, round, and reactive to light.  Neck: Normal range of motion and  full passive range of motion without pain. Neck supple. No JVD present. Carotid bruit is not present. No mass and no thyromegaly present.  Cardiovascular: Normal rate, normal heart sounds and intact distal pulses.   No murmur heard. Pulmonary/Chest: Effort normal and breath sounds normal. Right breast exhibits no inverted nipple, no mass, no nipple discharge, no skin change and no tenderness. Left breast exhibits no inverted nipple, no mass, no nipple discharge, no skin change and no tenderness.  Abdominal: Soft. Bowel sounds are normal. She exhibits no mass. There is no tenderness.  Genitourinary: Vagina normal and uterus normal. No breast swelling, tenderness, discharge or bleeding.  bimanual exam-No adnexal masses or tenderness. Cervix parous and pink, no discharge  Musculoskeletal: Normal range of motion.  Lymphadenopathy:    She has no cervical adenopathy.  Neurological: She is alert and oriented to person, place, and time.  Skin: Skin is warm and dry.  Psychiatric: She has a normal mood and affect. Her behavior is normal. Judgment and thought content normal.    BP 109/75  Pulse 96  Temp(Src) 97.5 F (36.4 C) (Oral)  Ht 5\' 4"  (1.626 m)  Wt 218 lb (98.884 kg)  BMI 37.4 kg/m2  LMP 12/22/2012       Assessment & Plan:  1. Annual physical exam  - POCT urinalysis dipstick - POCT UA - Microscopic Only - POCT CBC - Thyroid Panel With TSH  2. Encounter for routine gynecological examination    3. Hyperlipidemia Low fat diet and exercise - NMR Lipoprofile with Lipids - rosuvastatin (CRESTOR) 10 MG tablet; Take 1 tablet (10 mg total) by mouth daily.  Dispense: 90 tablet; Refill: 1  4. Bipolar 1 disorder, mixed, moderate  - lamoTRIgine (LAMICTAL) 150  MG tablet; Take 1 tablet (150 mg total) by mouth daily.  Dispense: 90 tablet; Refill: 1  5. Pheochromocytoma, left  - COMPLETE METABOLIC PANEL WITH GFR - lisinopril (PRINIVIL,ZESTRIL) 20 MG tablet; Take 1 tablet (20 mg total)  by mouth daily.  Dispense: 90 tablet; Refill: 1  6. Peripheral edema Decrease Na+ in diet - furosemide (LASIX) 40 MG tablet; Take 1 tablet (40 mg total) by mouth as needed.  Dispense: 90 tablet; Refill: 1  7. GAD (generalized anxiety disorder) Stress management - ALPRAZolam (XANAX) 1 MG tablet; Take 1 tablet (1 mg total) by mouth 2 (two) times daily as needed.  Dispense: 60 tablet; Refill: 0  Mary-Margaret Daphine Deutscher, FNP

## 2013-01-05 NOTE — Patient Instructions (Signed)

## 2013-01-05 NOTE — Addendum Note (Signed)
Addended by: Bennie Pierini on: 01/05/2013 10:34 AM   Modules accepted: Orders

## 2013-01-06 LAB — NMR LIPOPROFILE WITH LIPIDS
Cholesterol, Total: 190 mg/dL (ref <200)
HDL Particle Number: 44.6 umol/L (ref >=30.5)
HDL Size: 8.6 nm — ABNORMAL LOW (ref >=9.2)
HDL-C: 61 mg/dL (ref >=40)
LP-IR Score: 65 — ABNORMAL HIGH (ref <=45)
Large HDL-P: 3.6 umol/L — ABNORMAL LOW (ref >=4.8)
Large VLDL-P: 2.4 nmol/L (ref <=2.7)
Small LDL Particle Number: 708 nmol/L — ABNORMAL HIGH (ref <=527)
Triglycerides: 110 mg/dL (ref <150)
VLDL Size: 46.3 nm (ref <=46.6)

## 2013-01-07 LAB — PAP IG, CT-NG, RFX HPV ASCU: GC Probe Amp: NEGATIVE

## 2013-01-08 ENCOUNTER — Telehealth: Payer: Self-pay | Admitting: *Deleted

## 2013-01-08 MED ORDER — ATORVASTATIN CALCIUM 40 MG PO TABS: 40.0000 mg | ORAL_TABLET | Freq: Every day | ORAL | Status: DC

## 2013-01-08 NOTE — Telephone Encounter (Signed)
crestor changed to lipitor so ins should cover.

## 2013-01-08 NOTE — Telephone Encounter (Signed)
Crestor 74m requires prior authorization but patient hasn't taken and failed any other generic statin drugs so I don't believe they will authorize it.    We can provide her with samples or we can change to something like Lipitor.

## 2013-01-12 ENCOUNTER — Encounter: Payer: Self-pay | Admitting: *Deleted

## 2013-01-12 NOTE — Progress Notes (Signed)
Quick Note:  Letter sent to patient with lab results ______ 

## 2013-01-13 ENCOUNTER — Telehealth: Payer: Self-pay | Admitting: Nurse Practitioner

## 2013-01-13 NOTE — Telephone Encounter (Signed)
Pt aware of labs and copy sent to pt

## 2013-03-24 ENCOUNTER — Encounter: Payer: Self-pay | Admitting: General Practice

## 2013-03-24 ENCOUNTER — Ambulatory Visit (INDEPENDENT_AMBULATORY_CARE_PROVIDER_SITE_OTHER): Payer: BC Managed Care – PPO | Admitting: General Practice

## 2013-03-24 ENCOUNTER — Telehealth: Payer: Self-pay | Admitting: Nurse Practitioner

## 2013-03-24 VITALS — BP 159/99 | HR 107 | Temp 97.8°F | Ht 64.0 in | Wt 225.0 lb

## 2013-03-24 DIAGNOSIS — J029 Acute pharyngitis, unspecified: Secondary | ICD-10-CM

## 2013-03-24 LAB — POCT RAPID STREP A (OFFICE): Rapid Strep A Screen: NEGATIVE

## 2013-03-24 MED ORDER — AMOXICILLIN 875 MG PO TABS: 875.0000 mg | ORAL_TABLET | Freq: Two times a day (BID) | ORAL | Status: DC

## 2013-03-24 NOTE — Telephone Encounter (Signed)
APPT MADE

## 2013-03-24 NOTE — Progress Notes (Signed)
  Subjective:    Patient ID: Theresa [name], female    DOB: 16-Apr-1971, 42 y.o.   MRN: [id number]  Sore Throat  This is a new problem. The current episode started in the past 7 days. The problem has been gradually worsening. Neither side of throat is experiencing more pain than the other. The maximum temperature recorded prior to her arrival was 101 - 101.9 F. The fever has been present for 1 to 2 days. The pain is at a severity of 10/10. The pain is severe. Associated symptoms include ear pain and a hoarse voice. Pertinent negatives include no congestion, ear discharge, headaches, neck pain or shortness of breath. She has tried acetaminophen, NSAIDs and gargles for the symptoms.  Patient reports that her partner was diagnosed today with strep throat and they had shared a drink.     Review of Systems  Constitutional: Positive for fever. Negative for chills.  HENT: Positive for ear pain, sore throat, hoarse voice and sinus pressure. Negative for hearing loss, congestion, neck pain, neck stiffness, postnasal drip and ear discharge.   Respiratory: Negative for chest tightness and shortness of breath.   Cardiovascular: Negative for chest pain and palpitations.  Neurological: Negative for dizziness, weakness and headaches.       Objective:   Physical Exam  Constitutional: She is oriented to person, place, and time. She appears well-developed and well-nourished.  HENT:  Right Ear: Tympanic membrane is not erythematous.  Left Ear: Tympanic membrane is not erythematous.  Nose: Right sinus exhibits maxillary sinus tenderness and frontal sinus tenderness. Left sinus exhibits maxillary sinus tenderness and frontal sinus tenderness.  Mouth/Throat: Posterior oropharyngeal erythema present.  Eyes: EOM are normal.  Neck: Normal range of motion. Neck supple. No thyromegaly present.  Cardiovascular: Normal rate, regular rhythm and normal heart sounds.   Pulmonary/Chest: Effort normal and breath sounds  normal.  Lymphadenopathy:    She has no cervical adenopathy.  Neurological: She is alert and oriented to person, place, and time.  Skin: Skin is warm and dry.  Psychiatric: She has a normal mood and affect.   Results for orders placed in visit on 03/24/13  POCT RAPID STREP A (OFFICE)      Result Value Range   Rapid Strep A Screen Negative  Negative         Assessment & Plan:  1. Acute pharyngitis - POCT rapid strep A - amoxicillin (AMOXIL) 875 MG tablet; Take 1 tablet (875 mg total) by mouth 2 (two) times daily.  Dispense: 20 tablet; Refill: 0 -Increase fluid intake Motrin or tylenol OTC OTC decongestant Throat lozenges if help New toothbrush in 3 days Proper handwashing Patient verbalized understanding Coralie Keens, FNP-C

## 2013-03-24 NOTE — Patient Instructions (Signed)

## 2013-04-08 ENCOUNTER — Encounter: Payer: Self-pay | Admitting: Nurse Practitioner

## 2013-04-08 ENCOUNTER — Ambulatory Visit (INDEPENDENT_AMBULATORY_CARE_PROVIDER_SITE_OTHER): Payer: BC Managed Care – PPO | Admitting: Nurse Practitioner

## 2013-04-08 ENCOUNTER — Telehealth: Payer: Self-pay | Admitting: Nurse Practitioner

## 2013-04-08 VITALS — BP 140/92 | HR 98 | Temp 97.5°F | Ht 64.0 in | Wt 235.0 lb

## 2013-04-08 DIAGNOSIS — R3915 Urgency of urination: Secondary | ICD-10-CM

## 2013-04-08 DIAGNOSIS — R000 Tachycardia, unspecified: Secondary | ICD-10-CM

## 2013-04-08 DIAGNOSIS — I498 Other specified cardiac arrhythmias: Secondary | ICD-10-CM

## 2013-04-08 DIAGNOSIS — I10 Essential (primary) hypertension: Secondary | ICD-10-CM

## 2013-04-08 MED ORDER — SOLIFENACIN SUCCINATE 10 MG PO TABS: 10.0000 mg | ORAL_TABLET | Freq: Every day | ORAL | Status: DC

## 2013-04-08 MED ORDER — NEBIVOLOL HCL 10 MG PO TABS: 10.0000 mg | ORAL_TABLET | Freq: Every day | ORAL | Status: DC

## 2013-04-08 NOTE — Telephone Encounter (Signed)
Please schedule

## 2013-04-08 NOTE — Patient Instructions (Signed)
Nonspecific Tachycardia Tachycardia is a faster than normal heartbeat (more than 100 beats per minute). In adults, the heart normally beats between 60 and 100 times a minute. A fast heartbeat may be a normal response to exercise or stress. It does not necessarily mean that something is wrong. However, sometimes when your heart beats too fast it may not be able to pump enough blood to the rest of your body. This can result in chest pain, shortness of breath, dizziness, and even fainting. Nonspecific tachycardia means that the specific cause or pattern of your tachycardia is unknown. CAUSES  Tachycardia may be harmless or it may be due to a more serious underlying cause. Possible causes of tachycardia include:  Exercise or exertion.  Fever.  Pain or injury.  Infection.  Loss of body fluids (dehydration).  Overactive thyroid.  Lack of red blood cells (anemia).  Anxiety and stress.  Alcohol.  Caffeine.  Tobacco products.  Diet pills.  Illegal drugs.  Heart disease. SYMPTOMS  Rapid or irregular heartbeat (palpitations).  Suddenly feeling your heart beating (cardiac awareness).  Dizziness.  Tiredness (fatigue).  Shortness of breath.  Chest pain.  Nausea.  Fainting. DIAGNOSIS  Your caregiver will perform a physical exam and take your medical history. In some cases, a heart specialist (cardiologist) may be consulted. Your caregiver may also order:  Blood tests.  Electrocardiography. This test records the electrical activity of your heart.  A heart monitoring test. TREATMENT  Treatment will depend on the likely cause of your tachycardia. The goal is to treat the underlying cause of your tachycardia. Treatment methods may include:  Replacement of fluids or blood through an intravenous (IV) tube for moderate to severe dehydration or anemia.  New medicines or changes in your current medicines.  Diet and lifestyle changes.  Treatment for certain  infections.  Stress relief or relaxation methods. HOME CARE INSTRUCTIONS   Rest.  Drink enough fluids to keep your urine clear or pale yellow.  Do not smoke.  Avoid:  Caffeine.  Tobacco.  Alcohol.  Chocolate.  Stimulants such as over-the-counter diet pills or pills that help you stay awake.  Situations that cause anxiety or stress.  Illegal drugs such as marijuana, phencyclidine (PCP), and cocaine.  Only take medicine as directed by your caregiver.  Keep all follow-up appointments as directed by your caregiver. SEEK IMMEDIATE MEDICAL CARE IF:   You have pain in your chest, upper arms, jaw, or neck.  You become weak, dizzy, or feel faint.  You have palpitations that will not go away.  You vomit, have diarrhea, or pass blood in your stool.  Your skin is cool, pale, and wet.  You have a fever that will not go away with rest, fluids, and medicine. MAKE SURE YOU:   Understand these instructions.  Will watch your condition.  Will get help right away if you are not doing well or get worse. Document Released: 10/03/2004 Document Revised: 11/18/2011 Document Reviewed: 08/06/2011 [employer] Patient Information 2014 [employer].  

## 2013-04-08 NOTE — Telephone Encounter (Signed)
appt given  

## 2013-04-08 NOTE — Progress Notes (Signed)
  Subjective:    Patient ID: Theresa [name], female    DOB: 1970/10/05, 42 y.o.   MRN: [id number]  HPI`1. Patient use to be on bystolic for blood pressure and heart rate- got to be to expensive so we switched her to lisinopril which isn't working as well 2. Patient also c/o poor bladder control- doesn't have urgency to go until her bladder is ready to pop and she can't always make it to restroom.   Review of Systems  All other systems reviewed and are negative.       Objective:   Physical Exam  Constitutional: She is oriented to person, place, and time. She appears well-developed and well-nourished.  HENT:  Nose: Nose normal.  Mouth/Throat: Oropharynx is clear and moist.  Eyes: EOM are normal.  Neck: Trachea normal, normal range of motion and full passive range of motion without pain. Neck supple. No JVD present. Carotid bruit is not present. No thyromegaly present.  Cardiovascular: Normal rate, regular rhythm, normal heart sounds and intact distal pulses.  Exam reveals no gallop and no friction rub.   No murmur heard. Pulmonary/Chest: Effort normal and breath sounds normal.  Abdominal: Soft. Bowel sounds are normal. She exhibits no distension and no mass. There is no tenderness.  Musculoskeletal: Normal range of motion.  Lymphadenopathy:    She has no cervical adenopathy.  Neurological: She is alert and oriented to person, place, and time. She has normal reflexes.  Skin: Skin is warm and dry.  Psychiatric: She has a normal mood and affect. Her behavior is normal. Judgment and thought content normal.    BP 140/92  Pulse 98  Temp(Src) 97.5 F (36.4 C) (Oral)  Ht 5\' 4"  (1.626 m)  Wt 235 lb (106.595 kg)  BMI 40.32 kg/m2       Assessment & Plan:  1. Hypertension 2. Sinus tachycardia Avoid caffeine Low NA+ diet - nebivolol (BYSTOLIC) 10 MG tablet; Take 1 tablet (10 mg total) by mouth daily.  Dispense: 30 tablet; Refill: 3   3. Urinary urgency Force fluids Again avoid  caffeine - solifenacin (VESICARE) 10 MG tablet; Take 1 tablet (10 mg total) by mouth daily.  Dispense: 30 tablet; Refill: 0   Mary-Margaret Daphine Deutscher, FNP

## 2013-06-09 ENCOUNTER — Telehealth: Payer: Self-pay | Admitting: Nurse Practitioner

## 2013-06-09 ENCOUNTER — Other Ambulatory Visit: Payer: Self-pay | Admitting: Nurse Practitioner

## 2013-06-09 DIAGNOSIS — F411 Generalized anxiety disorder: Secondary | ICD-10-CM

## 2013-06-09 MED ORDER — ALPRAZOLAM 1 MG PO TABS: 1.0000 mg | ORAL_TABLET | Freq: Two times a day (BID) | ORAL | Status: DC | PRN

## 2013-06-09 NOTE — Telephone Encounter (Signed)
Call in xanax 1mg  BID #60 0 refills vesicare samples

## 2013-06-10 NOTE — Telephone Encounter (Signed)
Called in xanxa samples up front

## 2013-06-29 ENCOUNTER — Ambulatory Visit: Payer: BC Managed Care – PPO | Admitting: Nurse Practitioner

## 2013-07-15 ENCOUNTER — Other Ambulatory Visit: Payer: Self-pay

## 2013-07-21 ENCOUNTER — Telehealth: Payer: Self-pay | Admitting: Nurse Practitioner

## 2013-07-22 ENCOUNTER — Other Ambulatory Visit: Payer: Self-pay | Admitting: Nurse Practitioner

## 2013-07-22 MED ORDER — MELOXICAM 15 MG PO TABS: 15.0000 mg | ORAL_TABLET | Freq: Every day | ORAL | Status: DC

## 2013-07-22 MED ORDER — OMEPRAZOLE 40 MG PO CPDR: 40.0000 mg | DELAYED_RELEASE_CAPSULE | Freq: Every day | ORAL | Status: DC

## 2013-07-22 MED ORDER — SOLIFENACIN SUCCINATE 10 MG PO TABS: 10.0000 mg | ORAL_TABLET | Freq: Every day | ORAL | Status: DC

## 2013-07-22 NOTE — Telephone Encounter (Signed)
Sent in vesicare rx but mobic nor gerd meds are on her list of mds- make sure we have correct chart please

## 2013-07-22 NOTE — Telephone Encounter (Signed)
Medications are documented in paper chart.

## 2013-07-26 ENCOUNTER — Encounter: Payer: Self-pay | Admitting: Nurse Practitioner

## 2013-07-26 ENCOUNTER — Telehealth: Payer: Self-pay | Admitting: Nurse Practitioner

## 2013-07-26 ENCOUNTER — Encounter: Payer: Self-pay | Admitting: Family Medicine

## 2013-07-26 NOTE — Telephone Encounter (Signed)
I offered an appt with Theresa [name] for tomorrow and she said she would be out of town, patient said she would call back to schedule.

## 2013-07-29 ENCOUNTER — Encounter (INDEPENDENT_AMBULATORY_CARE_PROVIDER_SITE_OTHER): Payer: Self-pay | Admitting: General Surgery

## 2013-07-29 ENCOUNTER — Telehealth: Payer: Self-pay | Admitting: Nurse Practitioner

## 2013-07-29 ENCOUNTER — Ambulatory Visit (INDEPENDENT_AMBULATORY_CARE_PROVIDER_SITE_OTHER): Payer: BC Managed Care – PPO | Admitting: General Surgery

## 2013-07-29 VITALS — BP 126/82 | HR 80 | Temp 97.8°F | Resp 14 | Ht 64.0 in | Wt 251.0 lb

## 2013-07-29 DIAGNOSIS — R0609 Other forms of dyspnea: Secondary | ICD-10-CM

## 2013-07-29 DIAGNOSIS — R0989 Other specified symptoms and signs involving the circulatory and respiratory systems: Secondary | ICD-10-CM

## 2013-07-29 DIAGNOSIS — R0683 Snoring: Secondary | ICD-10-CM

## 2013-07-29 NOTE — Patient Instructions (Signed)
I need to investigate your history of pheochromocytoma before we start her workup for bariatric surgery. My office will contact you next week for further directions.

## 2013-08-03 ENCOUNTER — Encounter (INDEPENDENT_AMBULATORY_CARE_PROVIDER_SITE_OTHER): Payer: Self-pay | Admitting: General Surgery

## 2013-08-03 NOTE — Progress Notes (Addendum)
Patient ID: Theresa [name], female   DOB: 1970/11/03, 42 y.o.   MRN: [id number]  Chief Complaint  Patient presents with  . Weight Loss Surgery    HPI Theresa [name] is a 42 y.o. female.   HPI 42 year old morbidly obese Caucasian female referred by Chevis Pretty, family nurse practitioner for evaluation and consideration for weight loss surgery. The patient is specifically interested in laparoscopic gastric bypass. She states that she does not feel she is a good candidate for lap band because she travels a lot And is concerned with the frequent followup. Despite numerous attempts for sustained weight loss she has been unsuccessful. She has tried the [employer], Weight Watchers, Slim fast, and a low calorie exercise regimen while in the [employer]-all without any long-term success. She states that she was on phentermine for a very brief period of time but had to stop it because of blood pressure issues.  Interestingly while talking with the patient she reports being diagnosed with pheochromocytoma about 3 years ago at [facility]. She states that she was having problems with chest pain and blood pressure and an extensive workup led to her being diagnosed with pheochromocytoma. When asked  Why the pheochromocytoma was not removed she states that they could not locate it and that she would have to undergo exploratory surgery. She states that her insurance company would not approve exploratory surgery and therefore she has been medically managed for her pheochromocytoma. She states initially she had followup at [facility] but over the past couple years her PCPs office has been checking her blood for plasma metanephrines. She denies any palpitations, severe chest pain, lightheadedness or dizziness.  Past Medical History  Diagnosis Date  . Bipolar disorder   . Attention deficit disorder   . Hypertension   . Hyperlipidemia     Past Surgical History  Procedure Laterality Date  . Cesarean  section    . Tubal ligation      Family History  Problem Relation Age of Onset  . Hypertension Mother   . Cancer Mother     breast  . Diabetes Mother   . Hyperlipidemia Mother   . Thyroid disease Mother   . Hypertension Father   . Diabetes Father     Social History History  Substance Use Topics  . Smoking status: Never Smoker   . Smokeless tobacco: Not on file  . Alcohol Use: Yes    No Known Allergies  Current Outpatient Prescriptions  Medication Sig Dispense Refill  . ALPRAZolam (XANAX) 1 MG tablet Take 1 tablet (1 mg total) by mouth 2 (two) times daily as needed.  60 tablet  0  . amLODipine (NORVASC) 10 MG tablet Take 10 mg by mouth daily.      [name] atorvastatin (LIPITOR) 40 MG tablet Take 1 tablet (40 mg total) by mouth daily.  90 tablet  3  . furosemide (LASIX) 40 MG tablet Take 1 tablet (40 mg total) by mouth as needed.  90 tablet  1  . lamoTRIgine (LAMICTAL) 150 MG tablet Take 1 tablet (150 mg total) by mouth daily.  90 tablet  1  . meloxicam (MOBIC) 15 MG tablet Take 1 tablet (15 mg total) by mouth daily.  30 tablet  0  . nebivolol (BYSTOLIC) 10 MG tablet Take 1 tablet (10 mg total) by mouth daily.  30 tablet  3  . omeprazole (PRILOSEC) 40 MG capsule Take 1 capsule (40 mg total) by mouth daily.  30 capsule  3  .  solifenacin (VESICARE) 10 MG tablet Take 1 tablet (10 mg total) by mouth daily.  30 tablet  0  . solifenacin (VESICARE) 10 MG tablet Take 1 tablet (10 mg total) by mouth daily.  30 tablet  5   No current facility-administered medications for this visit.    Review of Systems Review of Systems  Constitutional: Negative for fever, activity change, appetite change and unexpected weight change.       Pheochromocytoma - see hpi  HENT: Negative for nosebleeds and trouble swallowing.   Eyes: Negative for photophobia and visual disturbance.  Respiratory: Negative for chest tightness and shortness of breath.   Cardiovascular: Positive for leg swelling. Negative for  chest pain and palpitations.       Denies CP, SOB, orthopnea; some DOE and PND, reports she has seen a cardiologist in past yrs ago for CP and had a cardiac cath which was normal. +snoring. B/l LE edema around ankles (L>R)   Gastrointestinal: Positive for nausea and abdominal pain. Negative for vomiting, diarrhea and constipation.       Reports some abd discomfort about 30 min after eating, lasts for about 2 hrs - feels like pressure, some associated nausea. +reflux. Takes prilosec and lots of tums. No jaundice/melena/hematochezia.   Endocrine: Negative for polydipsia, polyphagia and polyuria.       Reports being told she was borderline diabetic but no meds.   Genitourinary: Positive for frequency. Negative for dysuria and difficulty urinating.       Reports stress urinary incontinence. G4P4; also heavy menses  Musculoskeletal: Negative for arthralgias.       +pelvic pain secondary to remote pelvic fx; b/l knee pain and ankle pain  Skin: Negative for pallor and rash.  Neurological: Negative for dizziness, seizures, facial asymmetry and numbness.       Denies TIA and amaurosis fugax; remote h/o migraines.    Hematological: Negative for adenopathy. Does not bruise/bleed easily.  Psychiatric/Behavioral: Negative for behavioral problems and agitation.       Takes meds for bipolar - reports compliance with meds    Blood pressure 126/82, pulse 80, temperature 97.8 F (36.6 C), temperature source Temporal, resp. rate 14, height 5' 4"  (1.626 m), weight 251 lb (113.853 kg).  Physical Exam Physical Exam  Vitals reviewed. Constitutional: She is oriented to person, place, and time. She appears well-developed and well-nourished. No distress.  Morbidly obese  HENT:  Head: Normocephalic and atraumatic.  Right Ear: External ear normal.  Left Ear: External ear normal.  Eyes: Conjunctivae are normal. No scleral icterus.  Neck: Normal range of motion. Neck supple. No tracheal deviation present. No  thyromegaly present.  Cardiovascular: Normal rate and normal heart sounds.   Pulmonary/Chest: Effort normal and breath sounds normal. No stridor. No respiratory distress. She has no wheezes.  Abdominal: Soft. She exhibits no distension. There is no tenderness. There is no rebound and no guarding.  Musculoskeletal: She exhibits no edema and no tenderness.  Lymphadenopathy:    She has no cervical adenopathy.  Neurological: She is alert and oriented to person, place, and time. She exhibits normal muscle tone.  Skin: Skin is warm and dry. No rash noted. She is not diaphoretic. No erythema.  Psychiatric: She has a normal mood and affect. Her behavior is normal. Judgment and thought content normal.    Data Reviewed Chevis Pretty, FNP last several notes Hospital Discharge Summary 2004/2006 - reportedly normal cardiac cath in 2004 Epworth Sleepiness Scale score 13  Assessment    Morbid  obesity BMI 43 GERD Bipolar disorder HTN HPL Postprandial abdominal discomfort with nausea Stress urinary incontinence Glucose intolerance  Pheochromocytoma Snoring    Plan    This patient does need weight loss surgery criteria based on weight as well as comorbidities. However as I explained to the patient and her husband I am not sure if she is a candidate for weight loss surgery until I obtained additional information regarding this diagnosis of pheochromocytoma. I explained that we would need to obtain records from [facility] as well as from her primary care physician's office. We discussed pheochromocytoma and its potential implications during a surgical procedure such as severe malignant hypertension with potential catastrophic cardiovascular complications. Therefore we primarily discussed the surgical pathway to surgery and workup. I did not go into extensive detail regarding the risks and benefits of gastric bypass surgery until I can determine whether or not she is even a  candidate based on this diagnosis of pheochromocytoma. 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  Her Epworth Sleepiness Scale score was greater than 10 do believe that we can proceed with a sleep study. We also discussed her postprandial abdominal discomfort with nausea how this sounds like gallbladder disease. So therefore I do think she will probably need an abdominal ultrasound at some point. Prior to ordering a large number of tests that we routinely get for a bariatric surgery workup as well as nutrition and psychological consultations I told her I first needed to get a copy of her records regarding her pheochromocytoma and review them. Once I obtain these records we will contact the patient with additional plans. In the interim we will proceed with a sleep study.  A total of 60 minutes was spent in this consultation with this patient  UPDATE 08/19/13 - Please see telephone encounter from December 11. The patient's nurse practitioner office he does not follow the patient for her pheochromocytoma. They simply perform the lab work that is requested by the outside specialist. They are unable to locate the name of the specialist that the patient sees. We have contacted the patient asking her to provide [place] with the name of the specialist so that we may request records  Regarding her pheochromocytoma. She was informed that her bariatric workup cannot proceed until we have these outside records.   Leighton Ruff. Redmond Pulling, MD, FACS General, Bariatric, & Minimally Invasive Surgery [facility], [place]          [facility] M 08/03/2013, 6:09 PM

## 2013-08-19 ENCOUNTER — Encounter: Payer: Self-pay | Admitting: Nurse Practitioner

## 2013-08-19 ENCOUNTER — Telehealth (INDEPENDENT_AMBULATORY_CARE_PROVIDER_SITE_OTHER): Payer: Self-pay | Admitting: General Surgery

## 2013-08-19 ENCOUNTER — Other Ambulatory Visit: Payer: Self-pay | Admitting: Nurse Practitioner

## 2013-08-19 DIAGNOSIS — D3500 Benign neoplasm of unspecified adrenal gland: Secondary | ICD-10-CM

## 2013-08-19 NOTE — Telephone Encounter (Signed)
I spoke with patient- made her aware we have her records from [facility] that state she was going to have out patient work up to evaluate for pheochromocytoma. Patient states the only follow up she has had has been with Paulene Floor, NP. Dr Andrey Campanile spoke with Paulene Floor and they have not performed a work up for this at this time. I explained to patient that she needs to have a work up for this prior to proceeding with weight loss surgery. She will contact us back once this is completed.

## 2013-08-22 ENCOUNTER — Ambulatory Visit (HOSPITAL_BASED_OUTPATIENT_CLINIC_OR_DEPARTMENT_OTHER): Payer: BC Managed Care – PPO | Attending: General Surgery

## 2013-08-22 VITALS — Ht 64.0 in | Wt 250.0 lb

## 2013-08-22 DIAGNOSIS — R0683 Snoring: Secondary | ICD-10-CM

## 2013-08-22 DIAGNOSIS — G4733 Obstructive sleep apnea (adult) (pediatric): Secondary | ICD-10-CM | POA: Insufficient documentation

## 2013-08-28 DIAGNOSIS — R0989 Other specified symptoms and signs involving the circulatory and respiratory systems: Secondary | ICD-10-CM

## 2013-08-28 DIAGNOSIS — G4733 Obstructive sleep apnea (adult) (pediatric): Secondary | ICD-10-CM

## 2013-08-28 DIAGNOSIS — R0609 Other forms of dyspnea: Secondary | ICD-10-CM

## 2013-08-28 NOTE — Sleep Study (Signed)
   NAME: Theresa [name] DATE OF BIRTH:  04/04/1971 MEDICAL RECORD NUMBER [id number]  LOCATION: [facility]  PHYSICIAN: Cristabel Bicknell D  DATE OF STUDY: 08/22/2013  SLEEP STUDY TYPE: Nocturnal Polysomnogram               REFERRING PHYSICIAN: Atilano Ina, MD  INDICATION FOR STUDY: Hypersomnia with sleep apnea  EPWORTH SLEEPINESS SCORE:   14/24 HEIGHT: 5\' 4"  (162.6 cm)  WEIGHT: 250 lb (113.399 kg)    Body mass index is 42.89 kg/(m^2).  NECK SIZE: 16 in.  MEDICATIONS: Charted for review  SLEEP ARCHITECTURE: Total sleep time 378.5 minutes with sleep efficiency 92.4%. Stage I was 5%, stage II 67.8%, stage III 2.9%, REM 24.3% of total sleep time. Sleep latency 3 minutes, REM latency 92.5 minutes, awake after sleep onset 28 minutes, arousal index 2.5. Bedtime medication: None  RESPIRATORY DATA: Apnea hypopnea index (AHI) 16.6 per hour. A total of 105 events was scored including 20 obstructive apneas, 1 central apnea, 84 hypopneas. Events in all all sleep positions. REM AHI 46.3 per hour. There were not enough early events to permit application of split CPAP titration.  OXYGEN DATA: Loud snoring with oxygen desaturation to a nadir of 81% and the mean oxygen saturation through the study of 92.6% on room air.  CARDIAC DATA: Normal sinus rhythm  MOVEMENT/PARASOMNIA: No significant movement disturbance. No bathroom trips  IMPRESSION/ RECOMMENDATION:   1) Moderate obstructive sleep apnea/hypopnea syndrome, AHI 16.6 per hour. Non-positional events. Loud snoring with oxygen desaturation to a nadir of 81% and mean oxygen saturation through the study of 92.6% on room air.  2) There were not enough early events to permit split protocol CPAP titration. This patient can return for dedicated CPAP titration study if appropriate.  Signed Jetty Duhamel M.D. Waymon Budge Diplomate, American Board of Sleep Medicine  ELECTRONICALLY SIGNED ON:  08/28/2013, 2:30 PM [facility] PH: [phone]   FX: [phone] ACCREDITED BY THE AMERICAN ACADEMY OF SLEEP MEDICINE

## 2013-09-08 ENCOUNTER — Encounter (INDEPENDENT_AMBULATORY_CARE_PROVIDER_SITE_OTHER): Payer: Self-pay | Admitting: General Surgery

## 2013-09-13 ENCOUNTER — Encounter (INDEPENDENT_AMBULATORY_CARE_PROVIDER_SITE_OTHER): Payer: Self-pay | Admitting: General Surgery

## 2013-09-15 ENCOUNTER — Encounter: Payer: Self-pay | Admitting: Endocrinology

## 2013-09-15 ENCOUNTER — Ambulatory Visit (INDEPENDENT_AMBULATORY_CARE_PROVIDER_SITE_OTHER): Payer: BC Managed Care – PPO | Admitting: Endocrinology

## 2013-09-15 VITALS — BP 134/86 | HR 98 | Temp 98.3°F | Ht 64.0 in | Wt 264.0 lb

## 2013-09-15 DIAGNOSIS — D3500 Benign neoplasm of unspecified adrenal gland: Secondary | ICD-10-CM

## 2013-09-15 NOTE — Progress Notes (Signed)
Subjective:    Patient ID: Theresa [name], female    DOB: 1971-08-22, 43 y.o.   MRN: [id number]  HPI In 2003, pt presented with severe HTN.  She was rx'ed with coreg, but this caused hypotension.  In 2011, pt presented to [facility] ER with severe palpitations in the chest, and assoc HTN.  She says she was dx'ed with pheochromocytoma.  However, anatomic w/u failed to find the tumor, so she was managed medically.  She initially took normodyne, but she did not tolerate (again, due to hypotension).  She is now considering gastric bypass surgery, and needs to be cleared from that standpoint.   Past Medical History  Diagnosis Date  . Bipolar disorder   . Attention deficit disorder   . Hypertension   . Hyperlipidemia     Past Surgical History  Procedure Laterality Date  . Cesarean section    . Tubal ligation      History   Social History  . Marital Status: Divorced    Spouse Name: N/A    Number of Children: N/A  . Years of Education: N/A   Occupational History  . Not on file.   Social History Main Topics  . Smoking status: Never Smoker   . Smokeless tobacco: Not on file  . Alcohol Use: Yes  . Drug Use: No  . Sexual Activity: Not on file   Other Topics Concern  . Not on file   Social History Narrative  . No narrative on file    Current Outpatient Prescriptions on File Prior to Visit  Medication Sig Dispense Refill  . ALPRAZolam (XANAX) 1 MG tablet Take 1 tablet (1 mg total) by mouth 2 (two) times daily as needed.  60 tablet  0  . amLODipine (NORVASC) 10 MG tablet Take 10 mg by mouth daily.      [name] atorvastatin (LIPITOR) 40 MG tablet Take 1 tablet (40 mg total) by mouth daily.  90 tablet  3  . furosemide (LASIX) 40 MG tablet Take 1 tablet (40 mg total) by mouth as needed.  90 tablet  1  . lamoTRIgine (LAMICTAL) 150 MG tablet Take 1 tablet (150 mg total) by mouth daily.  90 tablet  1  . meloxicam (MOBIC) 15 MG tablet Take 1 tablet (15 mg total) by mouth daily.  30 tablet  0    . nebivolol (BYSTOLIC) 10 MG tablet Take 1 tablet (10 mg total) by mouth daily.  30 tablet  3  . omeprazole (PRILOSEC) 40 MG capsule Take 1 capsule (40 mg total) by mouth daily.  30 capsule  3  . solifenacin (VESICARE) 10 MG tablet Take 1 tablet (10 mg total) by mouth daily.  30 tablet  0  . solifenacin (VESICARE) 10 MG tablet Take 1 tablet (10 mg total) by mouth daily.  30 tablet  5   No current facility-administered medications on file prior to visit.    No Known Allergies  Family History  Problem Relation Age of Onset  . Hypertension Mother   . Cancer Mother     breast  . Diabetes Mother   . Hyperlipidemia Mother   . Thyroid disease Mother   . Hypertension Father   . Diabetes Father     BP 134/86  Pulse 98  Temp(Src) 98.3 F (36.8 C) (Oral)  Ht 5\' 4"  (1.626 m)  Wt 264 lb (119.75 kg)  BMI 45.29 kg/m2  SpO2 97%   Review of Systems denies headache, pallor, n/v, syncope, diarrhea, weight loss,  chest pain, sob, visual loss, palpitations, fever, arthralgias, rhinorrhea, easy bruising, and excessive diaphoresis. She has intermittent flushing of the face, approx once a week.  She has anxiety.  She has regular menses, but they are heavy.    Objective:   Physical Exam VS: see vs page GEN: no distress HEAD: head: no deformity.  No flushing or pallor now.   eyes: no periorbital swelling, no proptosis external nose and ears are normal mouth: no lesion seen.  No neurofibromata NECK: supple, thyroid is not enlarged CHEST WALL: no deformity LUNGS:  Clear to auscultation CV: reg rate and rhythm, no murmur ABD: abdomen is soft, nontender.  no hepatosplenomegaly.  not distended.  no hernia MUSCULOSKELETAL: muscle bulk and strength are grossly normal.  no obvious joint swelling.  gait is normal and steady EXTEMITIES: no deformity.  no ulcer on the feet.  feet are of normal color and temp.  Trace bilat leg edema.   PULSES: no carotid bruit.   NEURO:  cn 2-12 grossly intact.    readily moves all 4's.  sensation is intact to touch on all 4's. SKIN:  Normal texture and temperature.  No rash or suspicious lesion is visible.  No cafe-au-lait spots.  Moderate terminal hair on the face and neck. NODES:  None palpable at the neck.   PSYCH: alert, well-oriented.  Does not appear anxious nor depressed.     Assessment & Plan:  HTN: apparent h/o pheochromocytoma, but none was seen anatomically.  She did not tolerate alpha+beta blockade rx. Flushing: uncertain etiology Morbid obesity: We'll need to recheck 24-hr urine before she undergoes surgery

## 2013-09-15 NOTE — Patient Instructions (Signed)
Please continue the same medications for now. Here is a prescription to get a 24-hr urine test done.  Please start the collection right after you have symptoms. Please come back for a follow-up appointment in 2-4 weeks.

## 2013-09-22 ENCOUNTER — Other Ambulatory Visit (INDEPENDENT_AMBULATORY_CARE_PROVIDER_SITE_OTHER): Payer: Self-pay

## 2013-09-22 ENCOUNTER — Telehealth: Payer: Self-pay | Admitting: Endocrinology

## 2013-09-22 DIAGNOSIS — F988 Other specified behavioral and emotional disorders with onset usually occurring in childhood and adolescence: Secondary | ICD-10-CM

## 2013-09-22 DIAGNOSIS — E785 Hyperlipidemia, unspecified: Secondary | ICD-10-CM

## 2013-09-22 DIAGNOSIS — I10 Essential (primary) hypertension: Secondary | ICD-10-CM

## 2013-09-22 DIAGNOSIS — D3500 Benign neoplasm of unspecified adrenal gland: Secondary | ICD-10-CM

## 2013-09-22 NOTE — Telephone Encounter (Signed)
Requested call back to discuss.

## 2013-09-22 NOTE — Telephone Encounter (Signed)
please call patient: Have you done the urine test yet?  If not, please go ahead and do, even if you don't have symptoms.

## 2013-09-24 ENCOUNTER — Other Ambulatory Visit: Payer: Self-pay | Admitting: Endocrinology

## 2013-09-30 ENCOUNTER — Ambulatory Visit: Payer: BC Managed Care – PPO | Admitting: Endocrinology

## 2013-10-01 ENCOUNTER — Encounter: Payer: Self-pay | Admitting: Endocrinology

## 2013-10-01 ENCOUNTER — Ambulatory Visit (INDEPENDENT_AMBULATORY_CARE_PROVIDER_SITE_OTHER): Payer: BC Managed Care – PPO | Admitting: Endocrinology

## 2013-10-01 VITALS — BP 120/88 | HR 91 | Temp 98.0°F | Ht 64.0 in | Wt 260.0 lb

## 2013-10-01 DIAGNOSIS — I10 Essential (primary) hypertension: Secondary | ICD-10-CM

## 2013-10-01 LAB — COMPREHENSIVE METABOLIC PANEL
ALT: 25 U/L (ref 0–35)
AST: 16 U/L (ref 0–37)
Albumin: 4.2 g/dL (ref 3.5–5.2)
Alkaline Phosphatase: 73 U/L (ref 39–117)
BUN: 8 mg/dL (ref 6–23)
CO2: 24 mEq/L (ref 19–32)
Calcium: 9.3 mg/dL (ref 8.4–10.5)
Chloride: 102 mEq/L (ref 96–112)
Creat: 0.88 mg/dL (ref 0.50–1.10)
Glucose, Bld: 93 mg/dL (ref 70–99)
Potassium: 4.4 mEq/L (ref 3.5–5.3)
Sodium: 137 mEq/L (ref 135–145)
Total Bilirubin: 0.6 mg/dL (ref 0.3–1.2)
Total Protein: 6.8 g/dL (ref 6.0–8.3)

## 2013-10-01 LAB — PROTIME-INR
INR: 0.97 (ref <1.50)
Prothrombin Time: 12.8 seconds (ref 11.6–15.2)

## 2013-10-01 NOTE — Patient Instructions (Addendum)
Please continue the same medications.   her surgical risk is low and outweighed by the potential benefit of the surgery.  she is therefore medically cleared. I would be happy to see you back here whenever you want.

## 2013-10-01 NOTE — Progress Notes (Signed)
Subjective:    Patient ID: Theresa [name], female    DOB: 1971/03/07, 43 y.o.   MRN: [id number]  HPI In 2003, pt presented with severe HTN.  She was rx'ed with coreg, but this caused hypotension.  In 2011, pt presented to [facility] ER with severe palpitations in the chest, and assoc HTN.  She says she was dx'ed with pheochromocytoma.  However, anatomic w/u failed to find the tumor, so she was managed medically.  She initially took normodyne, but she did not tolerate (again, due to hypotension).  She is now considering gastric bypass surgery, and needs to be cleared from that standpoint.  pt states she feels well in general. Past Medical History  Diagnosis Date  . Bipolar disorder   . Attention deficit disorder   . Hypertension   . Hyperlipidemia     Past Surgical History  Procedure Laterality Date  . Cesarean section    . Tubal ligation      History   Social History  . Marital Status: Divorced    Spouse Name: N/A    Number of Children: N/A  . Years of Education: N/A   Occupational History  . Not on file.   Social History Main Topics  . Smoking status: Never Smoker   . Smokeless tobacco: Not on file  . Alcohol Use: Yes  . Drug Use: No  . Sexual Activity: Not on file   Other Topics Concern  . Not on file   Social History Narrative  . No narrative on file    Current Outpatient Prescriptions on File Prior to Visit  Medication Sig Dispense Refill  . ALPRAZolam (XANAX) 1 MG tablet Take 1 tablet (1 mg total) by mouth 2 (two) times daily as needed.  60 tablet  0  . amLODipine (NORVASC) 10 MG tablet Take 10 mg by mouth daily.      [name] atorvastatin (LIPITOR) 40 MG tablet Take 1 tablet (40 mg total) by mouth daily.  90 tablet  3  . furosemide (LASIX) 40 MG tablet Take 1 tablet (40 mg total) by mouth as needed.  90 tablet  1  . lamoTRIgine (LAMICTAL) 150 MG tablet Take 1 tablet (150 mg total) by mouth daily.  90 tablet  1  . meloxicam (MOBIC) 15 MG tablet Take 1 tablet (15 mg  total) by mouth daily.  30 tablet  0  . nebivolol (BYSTOLIC) 10 MG tablet Take 1 tablet (10 mg total) by mouth daily.  30 tablet  3  . omeprazole (PRILOSEC) 40 MG capsule Take 1 capsule (40 mg total) by mouth daily.  30 capsule  3  . solifenacin (VESICARE) 10 MG tablet Take 1 tablet (10 mg total) by mouth daily.  30 tablet  0  . solifenacin (VESICARE) 10 MG tablet Take 1 tablet (10 mg total) by mouth daily.  30 tablet  5   No current facility-administered medications on file prior to visit.    No Known Allergies  Family History  Problem Relation Age of Onset  . Hypertension Mother   . Cancer Mother     breast  . Diabetes Mother   . Hyperlipidemia Mother   . Thyroid disease Mother   . Hypertension Father   . Diabetes Father     BP 120/88  Pulse 91  Temp(Src) 98 F (36.7 C) (Oral)  Ht 5\' 4"  (1.626 m)  Wt 260 lb (117.935 kg)  BMI 44.61 kg/m2  SpO2 98%   Review of Systems Denies headache and  palpitations.      Objective:   Physical Exam VITAL SIGNS:  See vs page GENERAL: no distress LUNGS:  Clear to auscultation HEART:  Regular rate and rhythm without murmurs noted. Normal S1,S2.   Ext: no edema   outside test results are reviewed: 24-HR urine catechols are normal.    Assessment & Plan:  HTN: well-controlled.   ? H/o Pheochromocytoma: no current evidence of this Morbid obesity: the small risks of surgery are significantly outweighed by benefits.

## 2013-10-02 ENCOUNTER — Encounter: Payer: Self-pay | Admitting: Nurse Practitioner

## 2013-10-02 DIAGNOSIS — I10 Essential (primary) hypertension: Secondary | ICD-10-CM | POA: Insufficient documentation

## 2013-10-06 ENCOUNTER — Encounter: Payer: Self-pay | Admitting: Dietician

## 2013-10-06 ENCOUNTER — Ambulatory Visit: Payer: BC Managed Care – PPO | Admitting: General Practice

## 2013-10-06 ENCOUNTER — Encounter: Payer: BC Managed Care – PPO | Attending: General Surgery | Admitting: Dietician

## 2013-10-06 DIAGNOSIS — Z6841 Body Mass Index (BMI) 40.0 and over, adult: Secondary | ICD-10-CM | POA: Insufficient documentation

## 2013-10-06 DIAGNOSIS — Z713 Dietary counseling and surveillance: Secondary | ICD-10-CM | POA: Insufficient documentation

## 2013-10-06 NOTE — Progress Notes (Signed)
  Pre-Op Assessment Visit:  Pre-Operative RYGB Surgery  Medical Nutrition Therapy:  Appt start time: 4709   End time:  6283.  Patient was seen on 10/06/13 for Pre-Operative RYGB Nutrition Assessment. Assessment and letter of approval faxed to [facility] Bariatric Surgery Program coordinator on 10/06/2013.   Handouts given during visit include:  Pre-Op Goals Bariatric Surgery Protein Shakes Fast Food Guide for Bariatric Surgery  Patient to call the [facility] to enroll in Pre-Op and Post-Op Nutrition Education when surgery date is scheduled.

## 2013-10-06 NOTE — Patient Instructions (Signed)
Patient to call the [facility] to enroll in Pre-Op and Post-Op Nutrition Education when surgery date is scheduled. 

## 2013-10-08 ENCOUNTER — Institutional Professional Consult (permissible substitution): Payer: BC Managed Care – PPO | Admitting: Internal Medicine

## 2013-10-11 ENCOUNTER — Encounter (INDEPENDENT_AMBULATORY_CARE_PROVIDER_SITE_OTHER): Payer: Self-pay | Admitting: General Surgery

## 2013-10-12 ENCOUNTER — Encounter: Payer: Self-pay | Admitting: Internal Medicine

## 2013-10-12 ENCOUNTER — Ambulatory Visit (INDEPENDENT_AMBULATORY_CARE_PROVIDER_SITE_OTHER): Payer: BC Managed Care – PPO | Admitting: Internal Medicine

## 2013-10-12 VITALS — BP 122/72 | HR 90 | Ht 64.0 in | Wt 265.8 lb

## 2013-10-12 DIAGNOSIS — K219 Gastro-esophageal reflux disease without esophagitis: Secondary | ICD-10-CM

## 2013-10-12 DIAGNOSIS — G4733 Obstructive sleep apnea (adult) (pediatric): Secondary | ICD-10-CM

## 2013-10-12 NOTE — Patient Instructions (Signed)
Order- New DME new CPAP autotitrate 5-20 cwp x 7 days for pressure recommendation, mask of choice, humidifier   Dx OSA  Please call as needed

## 2013-10-12 NOTE — Progress Notes (Signed)
10/12/13- 79 yoF never smoker referred courtesy of Dr Glenis Smoker study 09-2013. NPSG 08/22/13- moderate OSA, AHI 16.6 per hour, and desaturation to 81%, body weight 250 pounds. She is aware of loud snoring. Wakes herself feeling short of breath. Being evaluated for gastric bypass bariatric surgery. 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 Fairly regular schedule and is rarely called out at night .Bedtime between 9 and 10 PM, sleep latency 10 minutes, wakes 6-7 times per night before up between 4 AM and 6 AM. 30 pound weight gain in the last 2 years. Denies ENT surgery, heart, lung or thyroid disease.. Coughs when she first lies down. Does not feel reflux while taking Prilosec. Treated for hypertension. Father has sleep apnea on CPAP.  Prior to Admission medications   Medication Sig Start Date End Date Taking? Authorizing Provider  ALPRAZolam Duanne Moron) 1 MG tablet Take 1 tablet (1 mg total) by mouth 2 (two) times daily as needed. 06/09/13  Yes Mary-Margaret Hassell Done, FNP  atorvastatin (LIPITOR) 40 MG tablet Take 1 tablet (40 mg total) by mouth daily. 01/08/13  Yes Mary-Margaret Hassell Done, FNP  furosemide (LASIX) 40 MG tablet Take 1 tablet (40 mg total) by mouth as needed. 01/05/13  Yes Mary-Margaret Hassell Done, FNP  meloxicam (MOBIC) 15 MG tablet Take 1 tablet (15 mg total) by mouth daily. 07/22/13  Yes Mary-Margaret Hassell Done, FNP  omeprazole (PRILOSEC) 40 MG capsule Take 1 capsule (40 mg total) by mouth daily. 07/22/13  Yes Mary-Margaret Hassell Done, FNP  solifenacin (VESICARE) 10 MG tablet Take 1 tablet (10 mg total) by mouth daily. 07/22/13  Yes Mary-Margaret Hassell Done, FNP  lamoTRIgine (LAMICTAL) 150 MG tablet Take 1 tablet (150 mg total) by mouth daily. 10/13/13   Mary-Margaret Hassell Done, FNP  nebivolol (BYSTOLIC) 10 MG tablet Take 1 tablet (10 mg total) by mouth daily. 10/13/13   Mary-Margaret Hassell Done, FNP   Past Medical History  Diagnosis Date  . Bipolar disorder   . Attention deficit disorder   . Hypertension    . Hyperlipidemia   . Sleep apnea, obstructive   .psh Family History  Problem Relation Age of Onset  . Hypertension Mother   . Cancer Mother     breast  . Diabetes Mother   . Hyperlipidemia Mother   . Thyroid disease Mother   . Hypertension Father   . Diabetes Father    History   Social History  . Marital Status: Divorced    Spouse Name: N/A    Number of Children: 23  . Years of Education: N/A   Occupational History  . Paramedic    Social History Main Topics  . Smoking status: Never Smoker   . Smokeless tobacco: Not on file  . Alcohol Use: Yes  . Drug Use: No  . Sexual Activity: Not on file   Other Topics Concern  . Not on file   Social History Narrative  . No narrative on file   ROS-see HPI Constitutional:   No-   weight loss, night sweats, fevers, chills, fatigue, lassitude. HEENT:   No-  headaches, difficulty swallowing, tooth/dental problems, sore throat,       No-  sneezing, itching, ear ache, nasal congestion, post nasal drip,  CV:  No-   chest pain, orthopnea, PND, swelling in lower extremities, anasarca,                                  dizziness, palpitations Resp: +  shortness of breath with exertion or at rest.              No-   productive cough,  + non-productive cough,  No- coughing up of blood.              No-   change in color of mucus.  No- wheezing.   Skin: No-   rash or lesions. GI:  No-   heartburn, indigestion, abdominal pain, nausea, vomiting, diarrhea,                 change in bowel habits, loss of appetite GU: No-   dysuria, change in color of urine, no urgency or frequency.  No- flank pain. MS:  No-   joint pain or swelling.  No- decreased range of motion.  No- back pain. Neuro-     nothing unusual Psych:  No- change in mood or affect. No depression or anxiety.  No memory loss.  OBJ- Physical Exam General- Alert, Oriented, Affect-appropriate, Distress- none acute. + Obese Skin- rash-none, lesions- none, excoriation-  none Lymphadenopathy- none Head- atraumatic            Eyes- Gross vision intact, PERRLA, conjunctivae and secretions clear            Ears- Hearing, canals-normal            Nose- Clear, no-Septal dev, mucus, polyps, erosion, perforation             Throat- Mallampati II-III , mucosa clear , drainage- none, tonsils- atrophic Neck- flexible , trachea midline, no stridor , thyroid nl, carotid no bruit Chest - symmetrical excursion , unlabored           Heart/CV- RRR , no murmur , no gallop  , no rub, nl s1 s2                           - JVD- none , edema- none, stasis changes- none, varices- none           Lung- clear to P&A, wheeze- none, cough- none , dullness-none, rub- none           Chest wall-  Abd- tender-no, distended-no, bowel sounds-present, HSM- no Br/ Gen/ Rectal- Not done, not indicated Extrem- cyanosis- none, clubbing, none, atrophy- none, strength- nl Neuro- grossly intact to observation

## 2013-10-13 ENCOUNTER — Other Ambulatory Visit: Payer: Self-pay | Admitting: *Deleted

## 2013-10-13 ENCOUNTER — Encounter: Payer: Self-pay | Admitting: Nurse Practitioner

## 2013-10-13 DIAGNOSIS — G4733 Obstructive sleep apnea (adult) (pediatric): Secondary | ICD-10-CM | POA: Insufficient documentation

## 2013-10-13 DIAGNOSIS — F3162 Bipolar disorder, current episode mixed, moderate: Secondary | ICD-10-CM

## 2013-10-13 DIAGNOSIS — K219 Gastro-esophageal reflux disease without esophagitis: Secondary | ICD-10-CM | POA: Insufficient documentation

## 2013-10-13 DIAGNOSIS — I10 Essential (primary) hypertension: Secondary | ICD-10-CM

## 2013-10-13 MED ORDER — NEBIVOLOL HCL 10 MG PO TABS: 10.0000 mg | ORAL_TABLET | Freq: Every day | ORAL | Status: DC

## 2013-10-13 MED ORDER — LAMOTRIGINE 150 MG PO TABS: 150.0000 mg | ORAL_TABLET | Freq: Every day | ORAL | Status: DC

## 2013-10-13 NOTE — Telephone Encounter (Signed)
NTBS.

## 2013-10-13 NOTE — Telephone Encounter (Signed)
ntbs

## 2013-10-13 NOTE — Assessment & Plan Note (Signed)
She describes coughing when she lies down.she takes Prilosec and does not feel heartburn but that may still be what is happening. Plan-recommend elevation head of bed with brick. Continue Prilosec

## 2013-10-13 NOTE — Assessment & Plan Note (Signed)
NPSG 08/22/13- moderate OSA, AHI 16.6 per hour, and desaturation to 81%, body weight 250 pounds. 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 She is trying to lose weight, exploring bariatric surgery. This would definitely help her. We discussed the medical issues of obstructive sleep apnea including her responsibility to drive safely. We discussed sleep hygiene and we discussed treatment options. Plan-start CPAP

## 2013-10-13 NOTE — Telephone Encounter (Signed)
Patient last seen in office on 03-24-13. Please advise

## 2013-10-14 ENCOUNTER — Telehealth: Payer: Self-pay | Admitting: Endocrinology

## 2013-10-14 NOTE — Telephone Encounter (Signed)
Thanks

## 2013-10-14 NOTE — Telephone Encounter (Signed)
PLAN: i have received 24-HR urine result--normal You are good to go for surgery.

## 2013-10-15 ENCOUNTER — Other Ambulatory Visit: Payer: Self-pay

## 2013-10-18 ENCOUNTER — Ambulatory Visit (HOSPITAL_COMMUNITY)
Admission: RE | Admit: 2013-10-18 | Discharge: 2013-10-18 | Disposition: A | Discharge status: Home / Self Care | Payer: BC Managed Care – PPO | Source: Ambulatory Visit | Attending: General Surgery | Admitting: General Surgery

## 2013-10-18 ENCOUNTER — Other Ambulatory Visit (INDEPENDENT_AMBULATORY_CARE_PROVIDER_SITE_OTHER): Payer: Self-pay

## 2013-10-18 ENCOUNTER — Other Ambulatory Visit (INDEPENDENT_AMBULATORY_CARE_PROVIDER_SITE_OTHER): Payer: Self-pay | Admitting: General Surgery

## 2013-10-18 ENCOUNTER — Other Ambulatory Visit: Payer: Self-pay

## 2013-10-18 DIAGNOSIS — D3500 Benign neoplasm of unspecified adrenal gland: Secondary | ICD-10-CM

## 2013-10-18 DIAGNOSIS — F988 Other specified behavioral and emotional disorders with onset usually occurring in childhood and adolescence: Secondary | ICD-10-CM | POA: Insufficient documentation

## 2013-10-18 DIAGNOSIS — I10 Essential (primary) hypertension: Secondary | ICD-10-CM

## 2013-10-18 DIAGNOSIS — R0989 Other specified symptoms and signs involving the circulatory and respiratory systems: Secondary | ICD-10-CM | POA: Insufficient documentation

## 2013-10-18 DIAGNOSIS — K219 Gastro-esophageal reflux disease without esophagitis: Secondary | ICD-10-CM | POA: Insufficient documentation

## 2013-10-18 DIAGNOSIS — E785 Hyperlipidemia, unspecified: Secondary | ICD-10-CM | POA: Insufficient documentation

## 2013-10-18 DIAGNOSIS — Z1231 Encounter for screening mammogram for malignant neoplasm of breast: Secondary | ICD-10-CM

## 2013-10-18 DIAGNOSIS — R0609 Other forms of dyspnea: Secondary | ICD-10-CM | POA: Insufficient documentation

## 2013-10-18 DIAGNOSIS — N393 Stress incontinence (female) (male): Secondary | ICD-10-CM | POA: Insufficient documentation

## 2013-10-18 DIAGNOSIS — Z6841 Body Mass Index (BMI) 40.0 and over, adult: Secondary | ICD-10-CM | POA: Insufficient documentation

## 2013-10-18 IMAGING — US US ABDOMEN COMPLETE
1 series · 14 of 25 positions shown · non-contrast
Comparison: None.

CLINICAL DATA: Preop surgery.  Morbid obesity.

EXAM:
ULTRASOUND ABDOMEN COMPLETE

[Series 1: us abdomen complete · 14 of 87 slices shown]
[im 1/87]
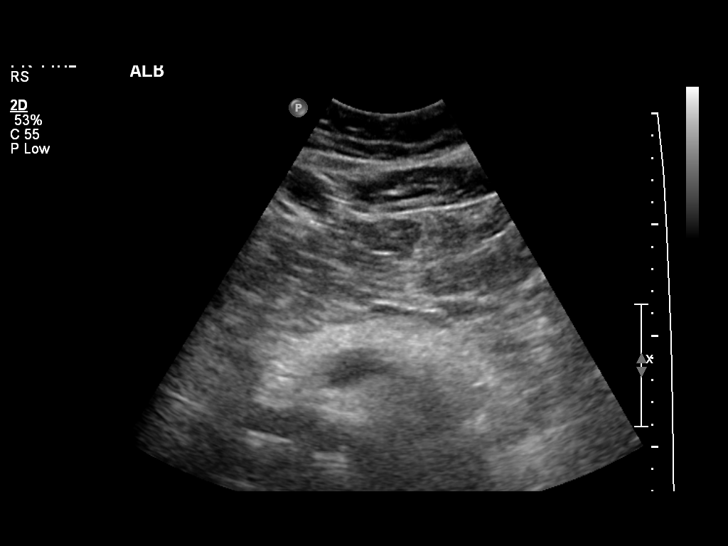
[im 8/87]
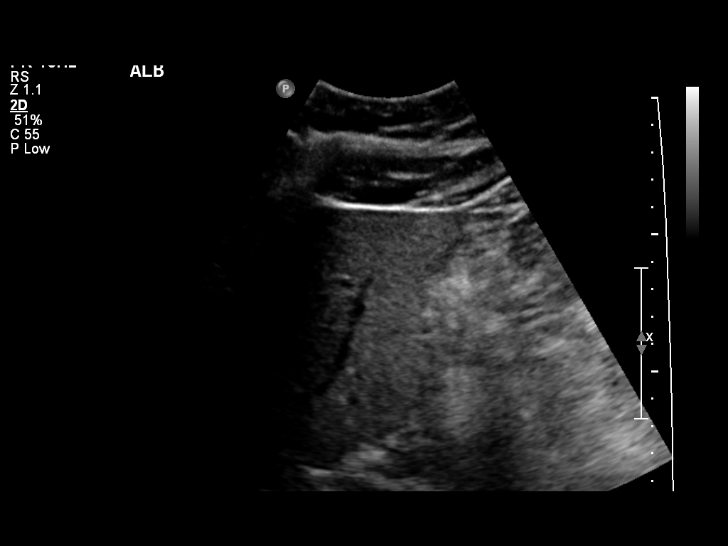
[im 15/87]
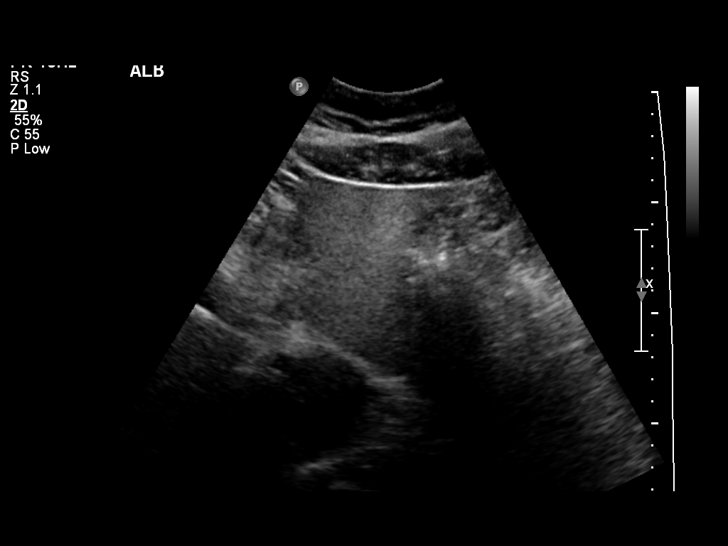
[im 22/87]
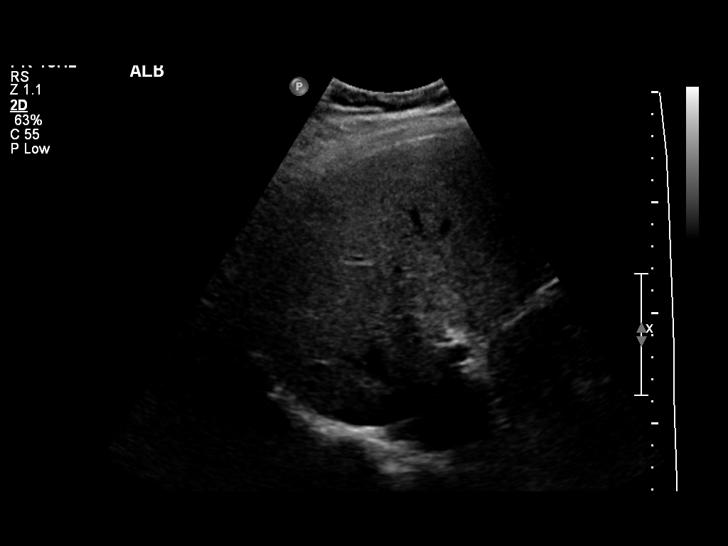
[im 29/87]
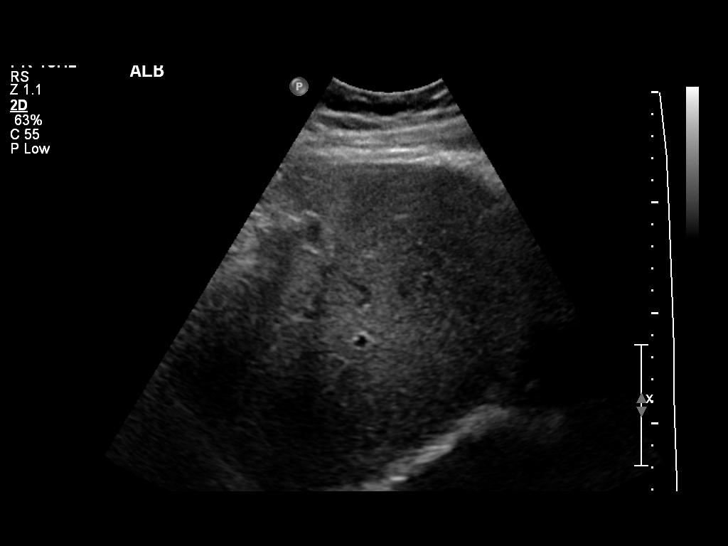
[im 33/87]
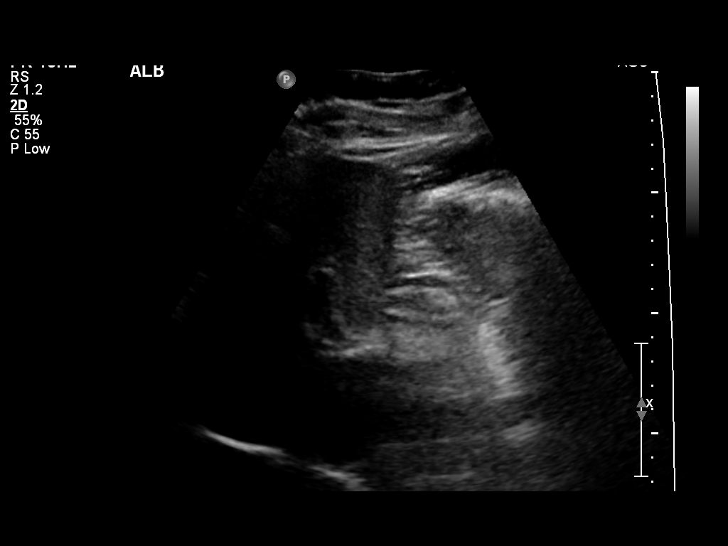
[im 40/87]
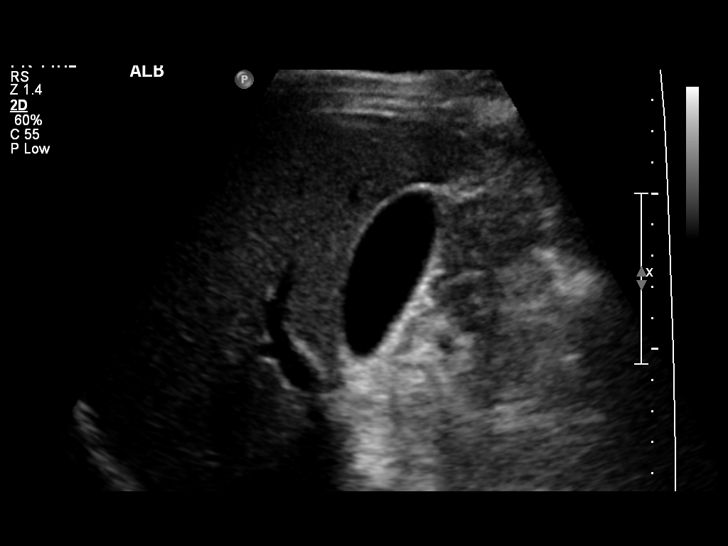
[im 47/87]
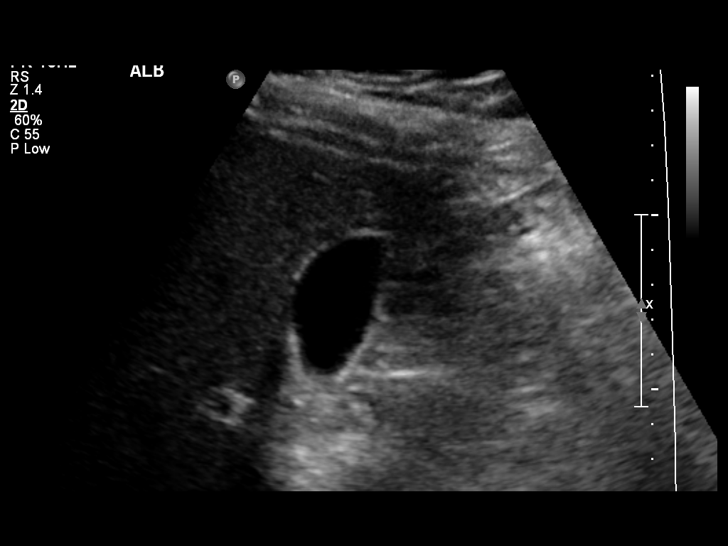
[im 54/87]
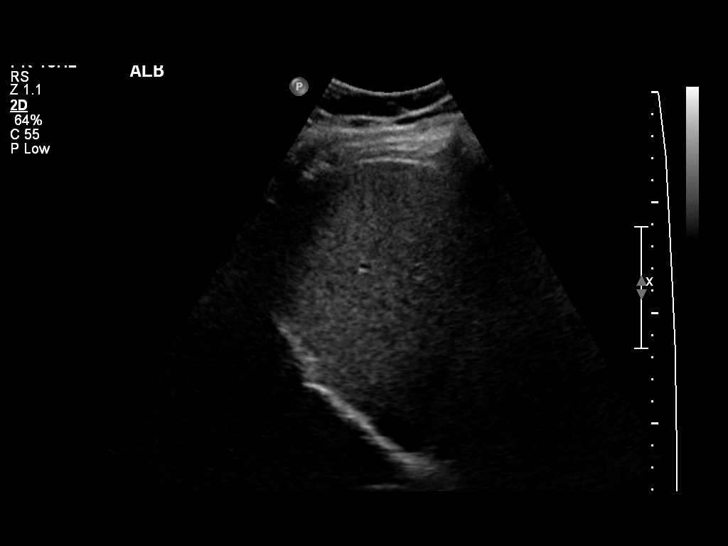
[im 58/87]
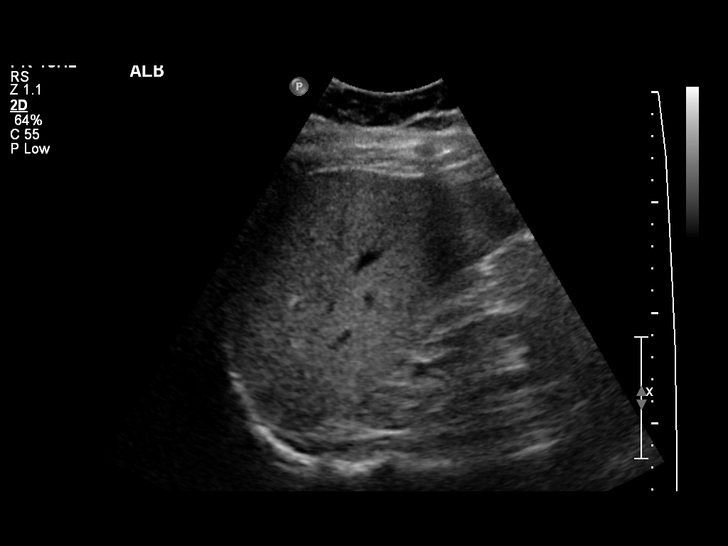
[im 65/87]
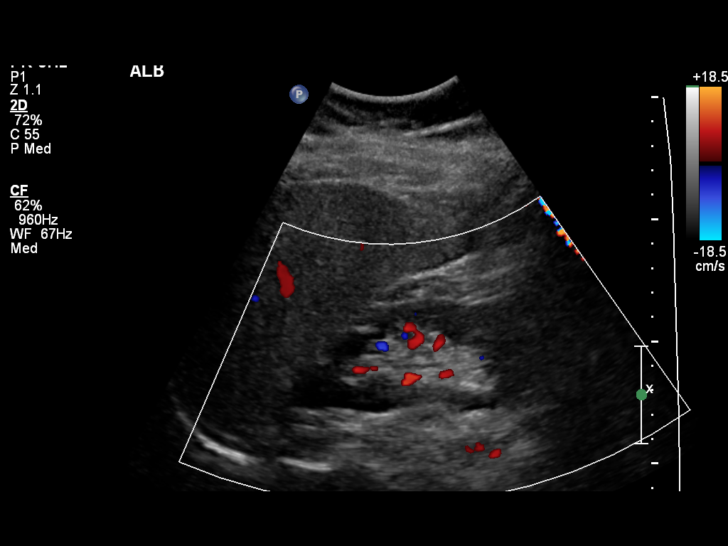
[im 72/87]
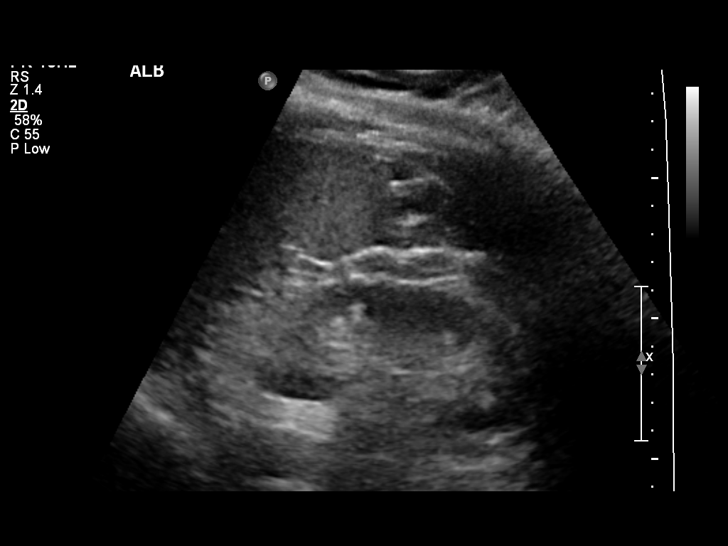
[im 79/87]
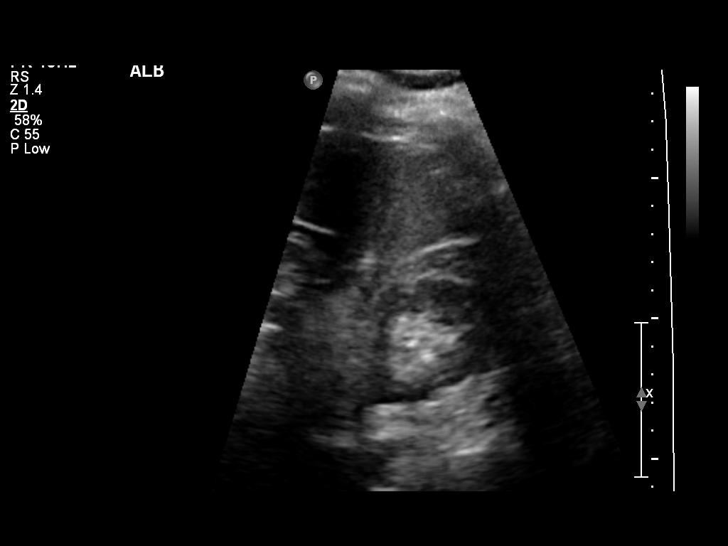
[im 87/87]
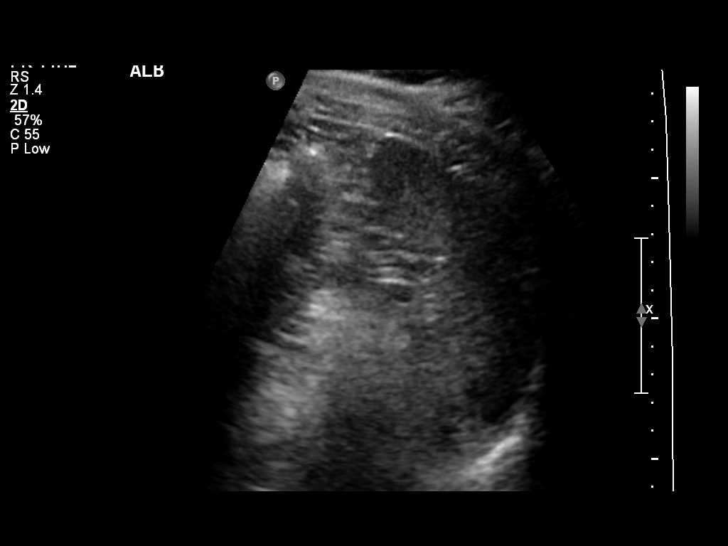

[14 of 25 positions shown; findings below may reference images not displayed]

FINDINGS: Gallbladder:

Gallbladder has a normal appearance. Gallbladder wall is 1.4 mm,
within normal limits. No stones or pericholecystic fluid. No
sonographic Murphy's sign.

Common bile duct:

Diameter: 1.8 mm

Liver:

No focal lesion identified. Within normal limits in parenchymal
echogenicity.

IVC:

No abnormality visualized.

Pancreas:

Visualized portion unremarkable.

Spleen:

5.6 cm, normal in appearance.

Right Kidney:

Length: 10.8 cm. Echogenicity within normal limits. No mass or
hydronephrosis visualized.

Left Kidney:

Length: 11.1 cm. Echogenicity within normal limits. No mass or
hydronephrosis visualized.

Abdominal aorta:

2.1 cm

Other findings:

None.
IMPRESSION: 1. Normal appearance of the gallbladder.
2. No evidence for acute abnormality the upper abdomen.

## 2013-10-18 IMAGING — RF DG UGI W/ KUB
15 of 23 series · 15 of 23 positions shown · non-contrast
Comparison: Ultrasound of the abdomen on [DATE]

FLUOROSCOPY TIME:  1 min, 18 seconds

CLINICAL DATA: Preop for bariatric surgery. History of C-section
and tubal ligation. The patient takes Prilosec for reflux.

EXAM:
UPPER GI SERIES W/ KUB
TECHNIQUE: After obtaining a scout radiograph a routine upper GI series was
performed using thin barium liquid.

[Series 1: run · 1 of 1 slices shown (1 of 14)]
[im 1/1]
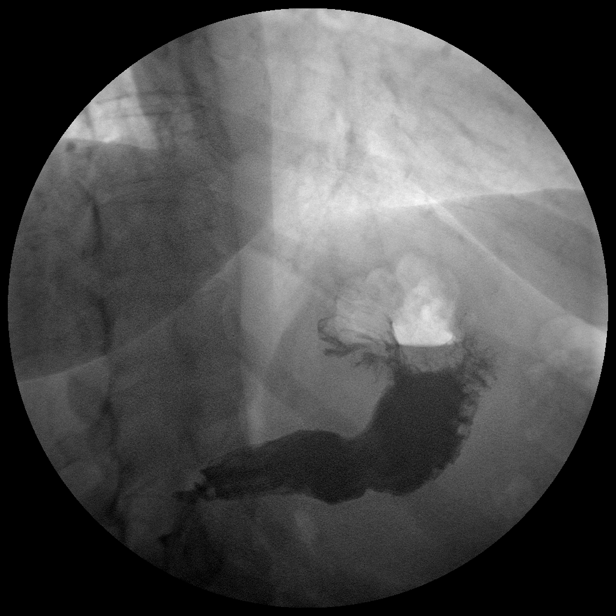

[Series 3: run · 1 of 1 slices shown (2 of 14)]
[im 1/1]
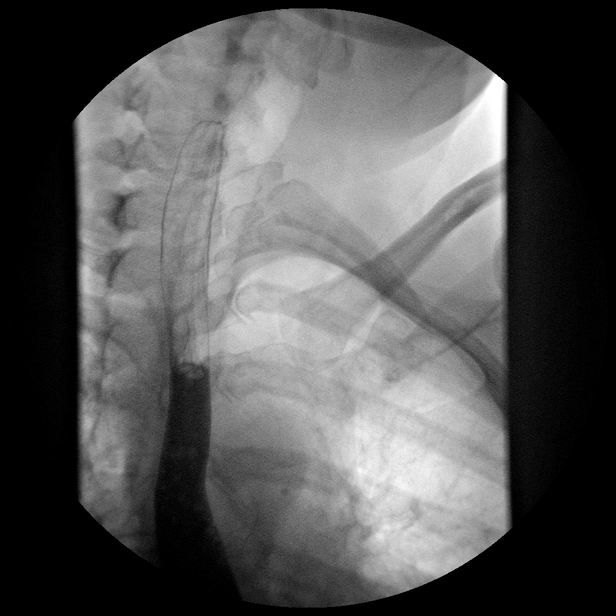

[Series 4: run · 1 of 1 slices shown (3 of 14)]
[im 1/1]
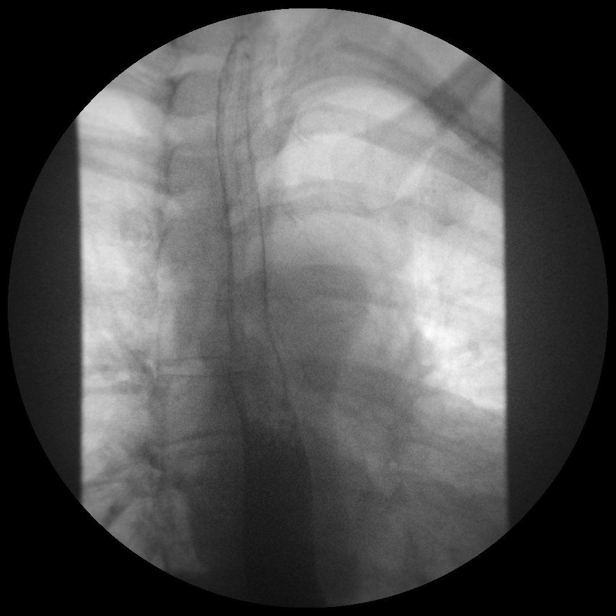

[Series 6: run · 1 of 1 slices shown (4 of 14)]
[im 1/1]
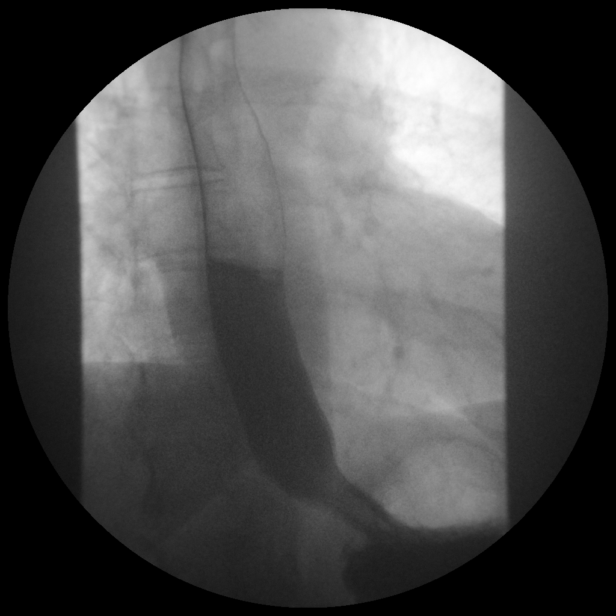

[Series 7: run · 1 of 1 slices shown (5 of 14)]
[im 1/1]
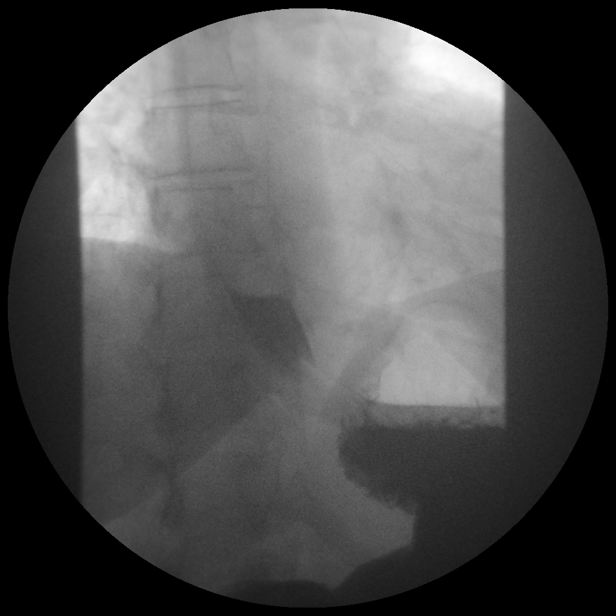

[Series 9: run · 1 of 1 slices shown (6 of 14)]
[im 1/1]
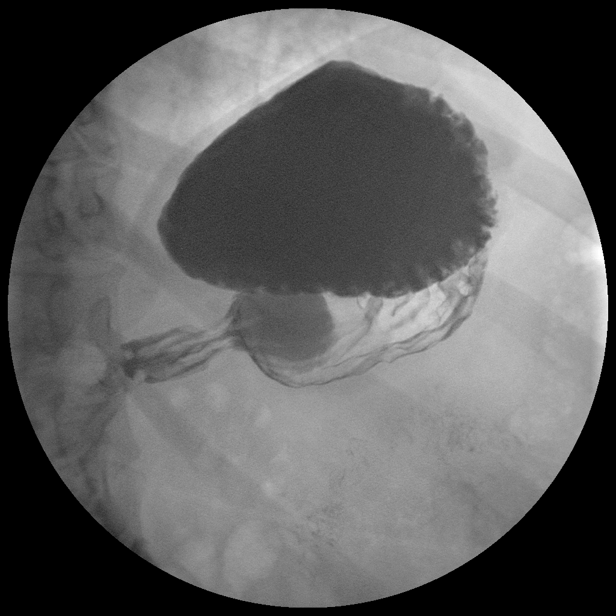

[Series 10: run · 1 of 1 slices shown (7 of 14)]
[im 1/1]
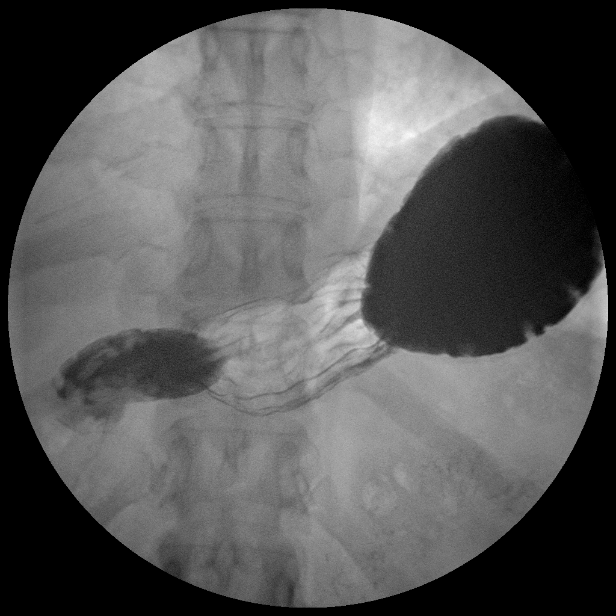

[Series 12: run · 1 of 1 slices shown (8 of 14)]
[im 1/1]
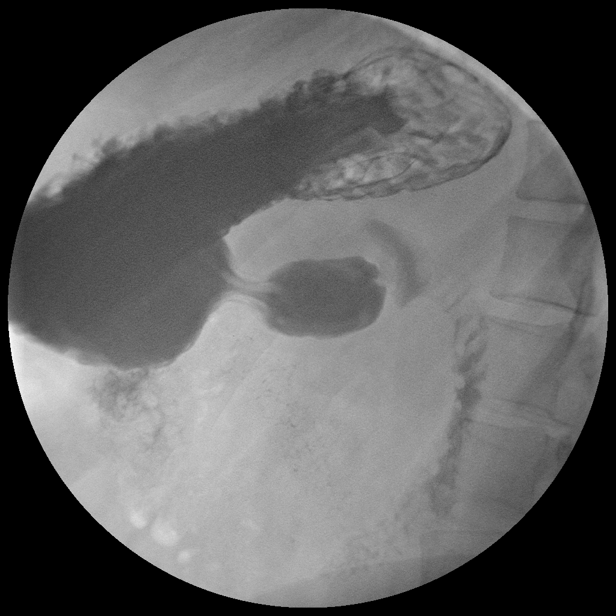

[Series 14: run · 1 of 1 slices shown (9 of 14)]
[im 1/1]
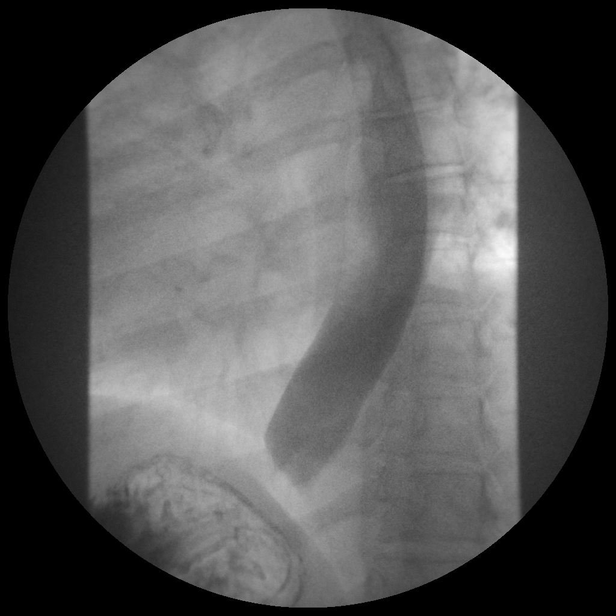

[Series 15: run · 1 of 1 slices shown (10 of 14)]
[im 1/1]
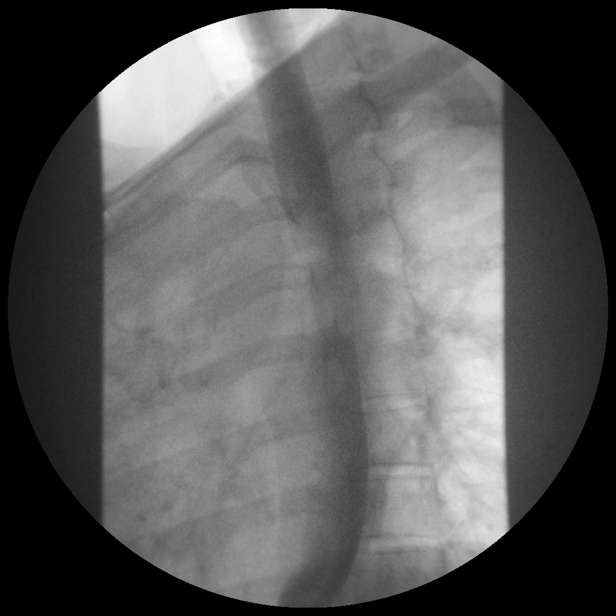

[Series 17: run · 1 of 1 slices shown (11 of 14)]
[im 1/1]
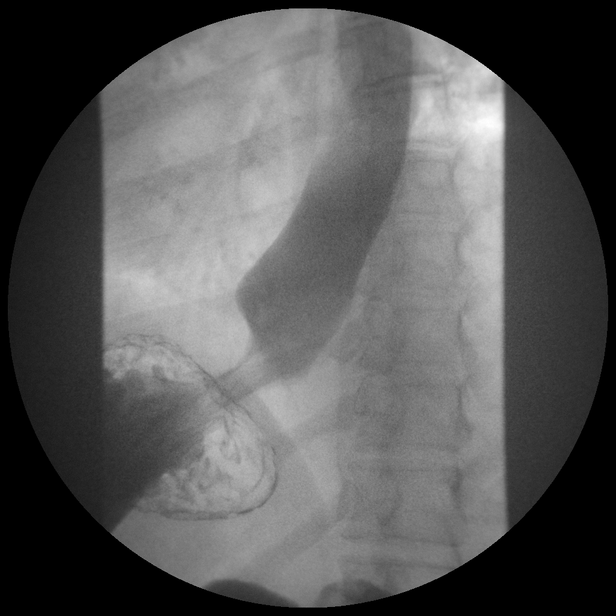

[Series 18: run · 1 of 1 slices shown (12 of 14)]
[im 1/1]
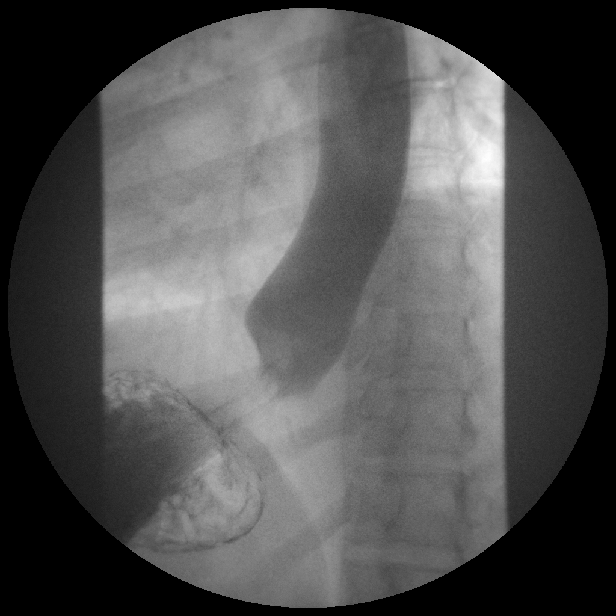

[Series 20: run · 1 of 1 slices shown (13 of 14)]
[im 1/1]
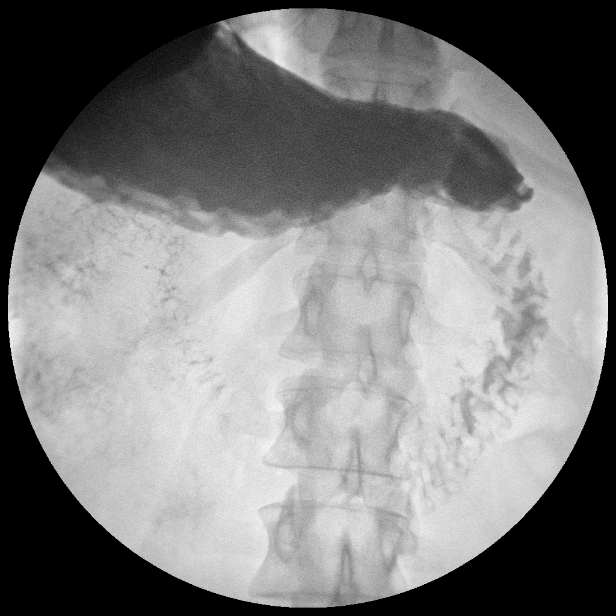

[Series 21: run · 1 of 1 slices shown (14 of 14)]
[im 1/1]
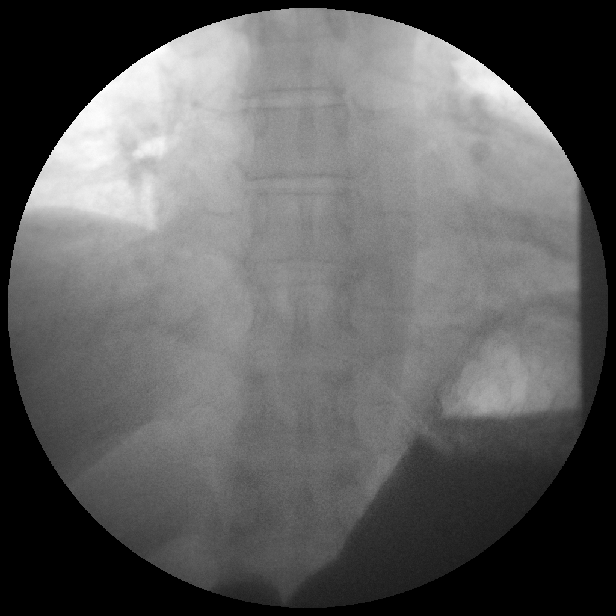

[Series 1002: view not recorded · 0.20mm/px · 1 of 1 slices shown]
[im 1/1]
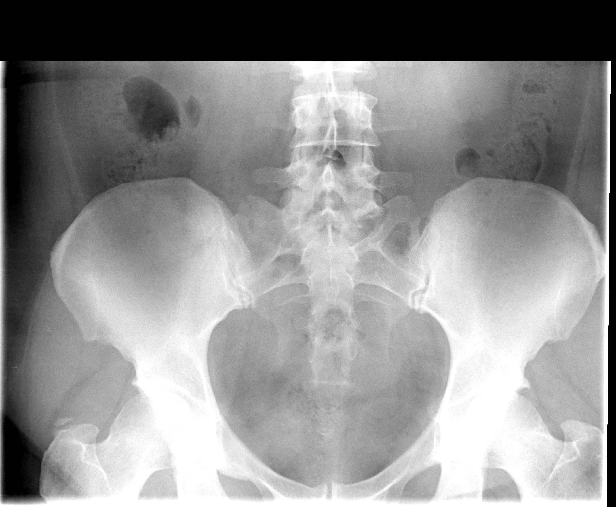

[15 of 23 positions shown; findings below may reference images not displayed]

FINDINGS: Scout film shows a nonobstructive bowel gas pattern. No evidence for
organomegaly or abnormal calcifications. Visualized osseous
structures have a normal appearance.

There is normal esophageal peristalsis. The esophageal contour is
normal. No evidence for stricture or mass. The gastroesophageal
junction opens widely. No hiatal hernia or gastroesophageal reflux
identified during the exam.

The gastric contour and rugal fold pattern are normal.

The duodenal bulb and sweep have a normal appearance. No evidence
for malrotation.
IMPRESSION: Normal upper GI evaluation.

## 2013-10-18 IMAGING — MG MM DIGITAL SCREENING BILAT W/ CAD
5 series · 5 of 5 positions shown · non-contrast
Comparison: None.

CLINICAL DATA: Screening.

EXAM:
DIGITAL SCREENING BILATERAL MAMMOGRAM WITH CAD

[L CC]
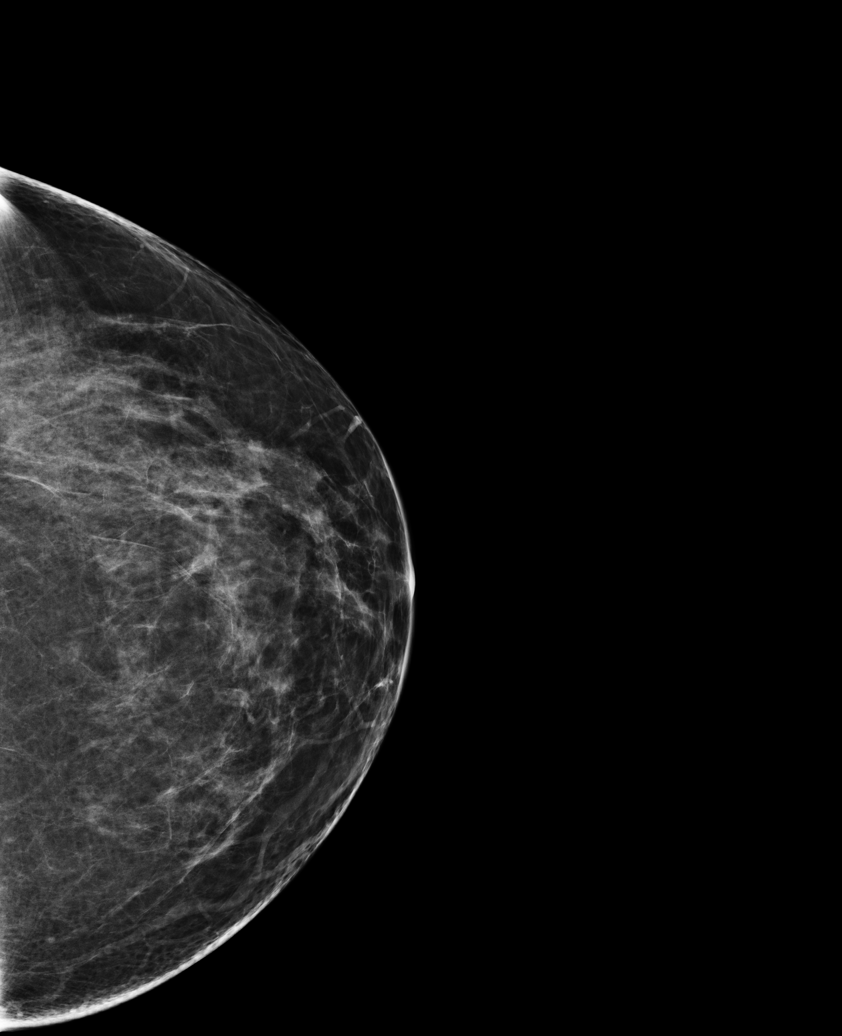

[R CC]
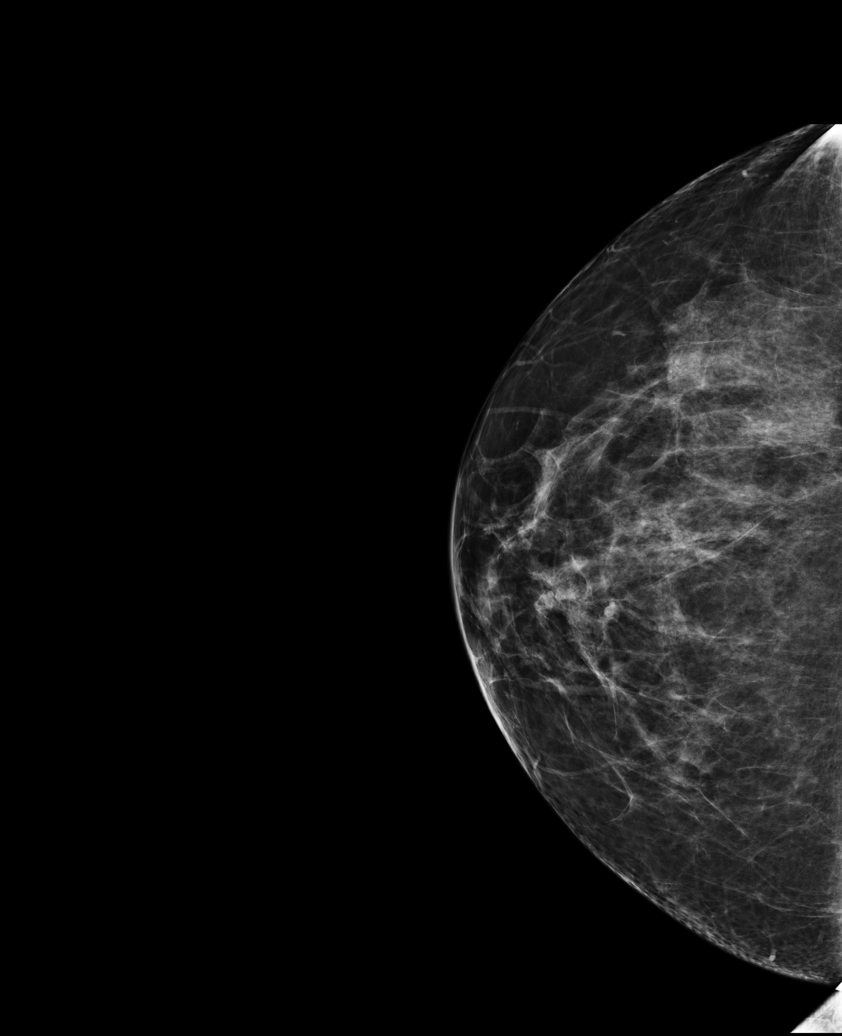

[R MLO (1 of 2)]
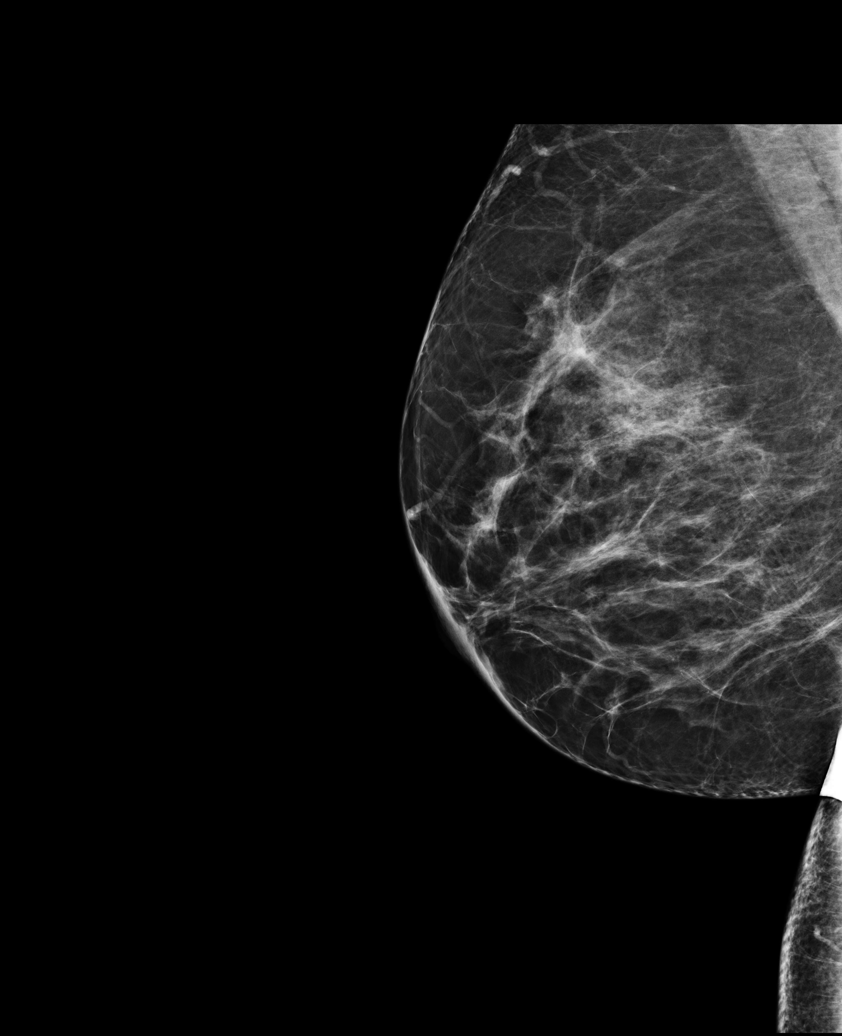

[L MLO]
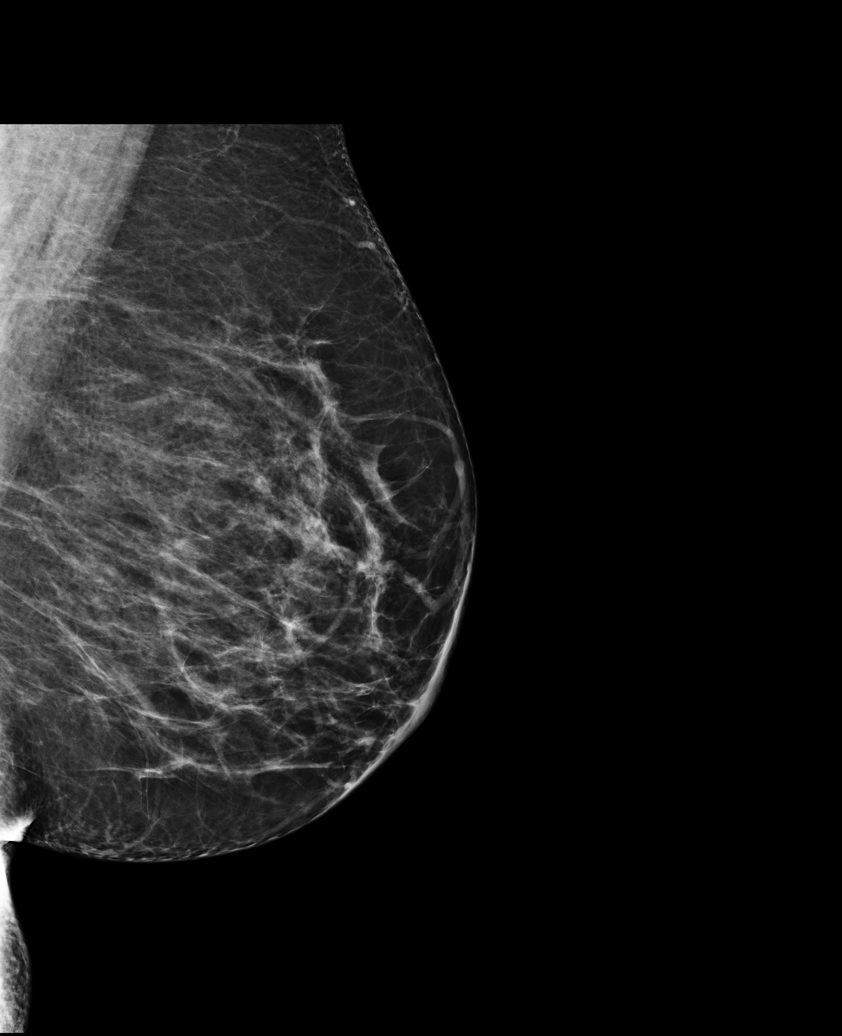

[R MLO (2 of 2)]
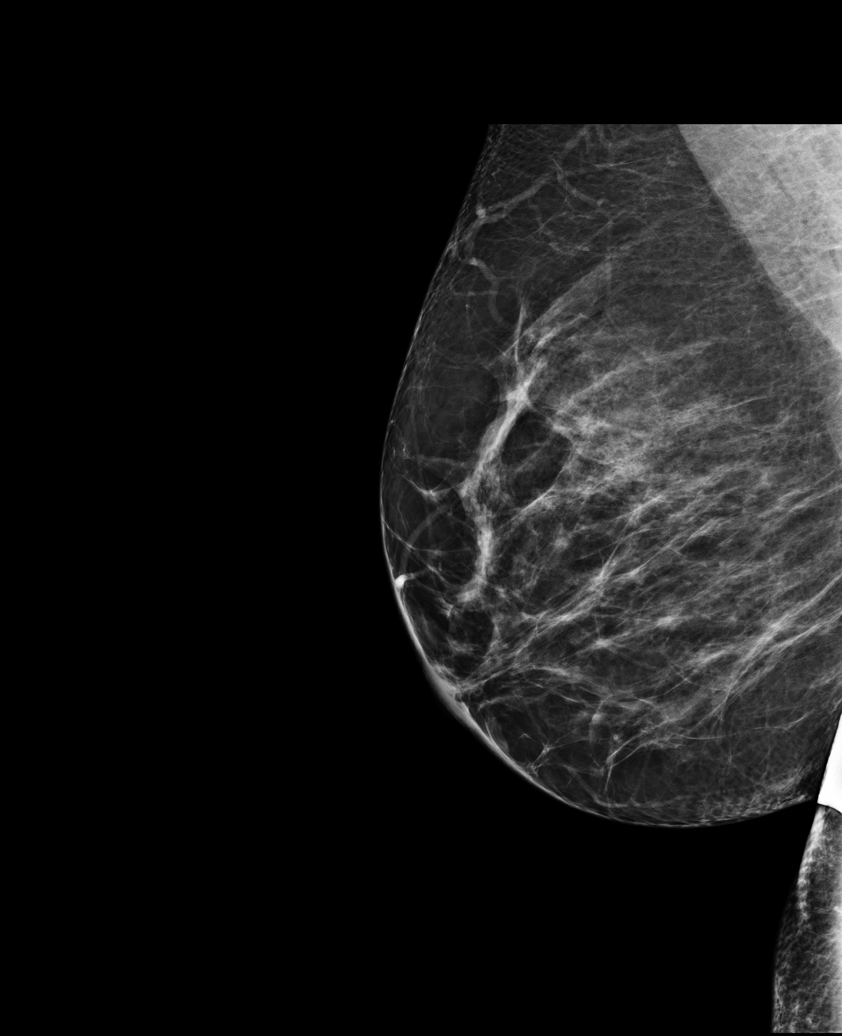

[5 of 5 positions shown; findings below may reference images not displayed]

ACR Breast Density Category c: The breast tissue is heterogeneously
dense, which may obscure small masses
FINDINGS: There are no findings suspicious for malignancy. Images were
processed with CAD.
IMPRESSION: No mammographic evidence of malignancy. A result letter of this
screening mammogram will be mailed directly to the patient.

RECOMMENDATION:
Screening mammogram in one year. (Code:[RG])

BI-RADS CATEGORY  1: Negative.

## 2013-10-19 ENCOUNTER — Encounter: Payer: Self-pay | Admitting: Endocrinology

## 2013-10-19 ENCOUNTER — Other Ambulatory Visit: Payer: Self-pay

## 2013-10-19 DIAGNOSIS — F3162 Bipolar disorder, current episode mixed, moderate: Secondary | ICD-10-CM

## 2013-10-19 LAB — COMPREHENSIVE METABOLIC PANEL
ALT: 21 U/L (ref 0–35)
AST: 14 U/L (ref 0–37)
Albumin: 4.1 g/dL (ref 3.5–5.2)
Alkaline Phosphatase: 68 U/L (ref 39–117)
BILIRUBIN TOTAL: 0.5 mg/dL (ref 0.2–1.2)
BUN: 8 mg/dL (ref 6–23)
CALCIUM: 9.4 mg/dL (ref 8.4–10.5)
CHLORIDE: 103 meq/L (ref 96–112)
CO2: 26 mEq/L (ref 19–32)
CREATININE: 0.85 mg/dL (ref 0.50–1.10)
GLUCOSE: 91 mg/dL (ref 70–99)
Potassium: 4.4 mEq/L (ref 3.5–5.3)
Sodium: 140 mEq/L (ref 135–145)
Total Protein: 6.4 g/dL (ref 6.0–8.3)

## 2013-10-19 LAB — CBC WITH DIFFERENTIAL/PLATELET
Basophils Absolute: 0 10*3/uL (ref 0.0–0.1)
Basophils Relative: 0 % (ref 0–1)
EOS ABS: 0.1 10*3/uL (ref 0.0–0.7)
EOS PCT: 2 % (ref 0–5)
HCT: 37 % (ref 36.0–46.0)
HEMOGLOBIN: 12 g/dL (ref 12.0–15.0)
LYMPHS ABS: 2 10*3/uL (ref 0.7–4.0)
LYMPHS PCT: 34 % (ref 12–46)
MCH: 28 pg (ref 26.0–34.0)
MCHC: 32.4 g/dL (ref 30.0–36.0)
MCV: 86.2 fL (ref 78.0–100.0)
Monocytes Absolute: 0.5 10*3/uL (ref 0.1–1.0)
Monocytes Relative: 8 % (ref 3–12)
Neutro Abs: 3.2 10*3/uL (ref 1.7–7.7)
Neutrophils Relative %: 56 % (ref 43–77)
Platelets: 252 10*3/uL (ref 150–400)
RBC: 4.29 MIL/uL (ref 3.87–5.11)
RDW: 14.8 % (ref 11.5–15.5)
WBC: 5.8 10*3/uL (ref 4.0–10.5)

## 2013-10-19 LAB — LIPID PANEL
Cholesterol: 155 mg/dL (ref 0–200)
HDL: 40 mg/dL (ref >39)
LDL Cholesterol: 77 mg/dL (ref 0–99)
Total CHOL/HDL Ratio: 3.9 Ratio
Triglycerides: 189 mg/dL — ABNORMAL HIGH (ref <150)
VLDL: 38 mg/dL (ref 0–40)

## 2013-10-19 LAB — H. PYLORI ANTIBODY, IGG: H PYLORI IGG: 0.49 {ISR}

## 2013-10-19 LAB — HCG, SERUM, QUALITATIVE: Preg, Serum: NEGATIVE

## 2013-10-19 LAB — T4: T4 TOTAL: 7.6 ug/dL (ref 5.0–12.5)

## 2013-10-19 LAB — HEMOGLOBIN A1C
Hgb A1c MFr Bld: 5.9 % — ABNORMAL HIGH (ref <5.7)
Mean Plasma Glucose: 123 mg/dL — ABNORMAL HIGH (ref <117)

## 2013-10-19 LAB — TSH: TSH: 1.28 u[IU]/mL (ref 0.350–4.500)

## 2013-10-19 MED ORDER — LAMOTRIGINE 150 MG PO TABS: 150.0000 mg | ORAL_TABLET | Freq: Every day | ORAL | Status: DC

## 2013-10-21 ENCOUNTER — Other Ambulatory Visit: Payer: Self-pay | Admitting: Nurse Practitioner

## 2013-10-21 LAB — METANEPHRINES, PLASMA
Metanephrine, Free: <25 pg/mL (ref <=57)
NORMETANEPHRINE FREE: 83 pg/mL (ref <=148)
TOTAL METANEPHRINES-PLASMA: 83 pg/mL (ref <=205)

## 2013-10-24 ENCOUNTER — Other Ambulatory Visit: Payer: Self-pay | Admitting: Nurse Practitioner

## 2013-10-24 DIAGNOSIS — F411 Generalized anxiety disorder: Secondary | ICD-10-CM

## 2013-10-24 MED ORDER — ALPRAZOLAM 1 MG PO TABS: 1.0000 mg | ORAL_TABLET | Freq: Two times a day (BID) | ORAL | Status: DC | PRN

## 2013-10-24 MED ORDER — MELOXICAM 15 MG PO TABS
15.0000 mg | ORAL_TABLET | Freq: Every day | ORAL | Status: DC
Start: 2013-10-24 — End: 2013-11-26

## 2013-10-25 NOTE — Progress Notes (Signed)
Refill authorization left on pharmacy voicemail.

## 2013-11-01 LAB — CATECHOLAMINES, FRACTIONATED, URINE, 24 HOUR
DOPAMINE RANDOM UR: 120 ug/L
Dopamine , 24H Ur: 276 ug/24 hr (ref 0–510)
EPINEPHRINE RAND UR: 2 ug/L
Epinephrine, 24H Ur: 5 ug/24 hr (ref 0–20)
Norepinephrine, 24H Ur: 76 ug/24 hr (ref 0–135)
Norepinephrine, Rand Ur: 33 ug/L

## 2013-11-01 LAB — METANEPHRINES, URINE, 24 HOUR
METANEPH TOTAL UR: 51 ug/L
Metanephrines, 24H Ur: 117 ug/24 hr (ref 45–290)
NORMETANEPHRINE 24H UR: 490 ug/(24.h) (ref 82–500)
NORMETANEPHRINE UR: 213 ug/L

## 2013-11-05 ENCOUNTER — Other Ambulatory Visit (INDEPENDENT_AMBULATORY_CARE_PROVIDER_SITE_OTHER): Payer: Self-pay | Admitting: General Surgery

## 2013-11-08 ENCOUNTER — Ambulatory Visit: Payer: BC Managed Care – PPO | Admitting: Dietician

## 2013-11-23 ENCOUNTER — Ambulatory Visit (INDEPENDENT_AMBULATORY_CARE_PROVIDER_SITE_OTHER): Payer: BC Managed Care – PPO | Admitting: Internal Medicine

## 2013-11-23 ENCOUNTER — Encounter: Payer: Self-pay | Admitting: Internal Medicine

## 2013-11-23 VITALS — BP 126/80 | HR 73 | Ht 64.0 in | Wt 259.4 lb

## 2013-11-23 DIAGNOSIS — E66813 Obesity, class 3: Secondary | ICD-10-CM

## 2013-11-23 DIAGNOSIS — G4733 Obstructive sleep apnea (adult) (pediatric): Secondary | ICD-10-CM

## 2013-11-23 NOTE — Patient Instructions (Signed)
We can continue CPAP/ Advanced, currently set on AutoPAP.   If we make a change and you don't like it, please let us know.

## 2013-11-23 NOTE — Progress Notes (Signed)
10/12/13- 50 yoF never smoker referred courtesy of Dr Glenis Smoker study 09-2013. NPSG 08/22/13- moderate OSA, AHI 16.6 per hour, and desaturation to 81%, body weight 250 pounds. She is aware of loud snoring. Wakes herself feeling short of breath. Being evaluated for gastric bypass bariatric surgery. 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 Fairly regular schedule and is rarely called out at night .Bedtime between 9 and 10 PM, sleep latency 10 minutes, wakes 6-7 times per night before up between 4 AM and 6 AM. 30 pound weight gain in the last 2 years. Denies ENT surgery, heart, lung or thyroid disease.. Coughs when she first lies down. Does not feel reflux while taking Prilosec. Treated for hypertension. Father has sleep apnea on CPAP.  11/23/13-  88 yoF never smoker followed for OSA, complicated by GERD, Bipolar, hx pheochromocytoma. EMT FOLLOWS FOR: DME Wears CPAP AutoPaP/ Advanced every night for 7 hours at the most(if wearing at work depends on calls through her shift). Sleeping much better and likes AutoPap.  ROS-see HPI Constitutional:   No-   weight loss, night sweats, fevers, chills, fatigue, lassitude. HEENT:   No-  headaches, difficulty swallowing, tooth/dental problems, sore throat,       No-  sneezing, itching, ear ache, nasal congestion, post nasal drip,  CV:  No-   chest pain, orthopnea, PND, swelling in lower extremities, anasarca,                                                               dizziness, palpitations Resp: + shortness of breath with exertion or at rest.              No-   productive cough,  + non-productive cough,  No- coughing up of blood.              No-   change in color of mucus.  No- wheezing.   Skin: No-   rash or lesions. GI:  No-   heartburn, indigestion, abdominal pain, nausea, vomiting,  GU: . MS:  No-   joint pain or swelling.  . Neuro-     nothing unusual Psych:  No- change in mood or affect. No depression or anxiety.  No memory loss.  OBJ-  Physical Exam General- Alert, Oriented, Affect-appropriate, Distress- none acute. + Obese Skin- rash-none, lesions- none, excoriation- none Lymphadenopathy- none Head- atraumatic            Eyes- Gross vision intact, PERRLA, conjunctivae and secretions clear            Ears- Hearing, canals-normal            Nose- Clear, no-Septal dev, mucus, polyps, erosion, perforation             Throat- Mallampati II-III , mucosa clear , drainage- none, tonsils- atrophic Neck- flexible , trachea midline, no stridor , thyroid nl, carotid no bruit Chest - symmetrical excursion , unlabored           Heart/CV- RRR , no murmur , no gallop  , no rub, nl s1 s2                           - JVD- none , edema- none, stasis changes- none,  varices- none           Lung- clear to P&A, wheeze- none, cough- none , dullness-none, rub- none           Chest wall-  Abd-  Br/ Gen/ Rectal- Not done, not indicated Extrem- cyanosis- none, clubbing, none, atrophy- none, strength- nl Neuro- grossly intact to observation

## 2013-11-25 ENCOUNTER — Ambulatory Visit (INDEPENDENT_AMBULATORY_CARE_PROVIDER_SITE_OTHER): Payer: BC Managed Care – PPO | Admitting: General Surgery

## 2013-11-25 ENCOUNTER — Encounter (INDEPENDENT_AMBULATORY_CARE_PROVIDER_SITE_OTHER): Payer: Self-pay | Admitting: General Surgery

## 2013-11-25 ENCOUNTER — Encounter: Payer: BC Managed Care – PPO | Attending: General Surgery

## 2013-11-25 VITALS — Ht 64.0 in | Wt 257.0 lb

## 2013-11-25 VITALS — BP 132/80 | HR 72 | Temp 97.5°F | Resp 16 | Ht 64.0 in | Wt 258.6 lb

## 2013-11-25 DIAGNOSIS — Z6841 Body Mass Index (BMI) 40.0 and over, adult: Secondary | ICD-10-CM | POA: Insufficient documentation

## 2013-11-25 DIAGNOSIS — Z713 Dietary counseling and surveillance: Secondary | ICD-10-CM | POA: Insufficient documentation

## 2013-11-25 MED ORDER — OXYCODONE HCL 5 MG/5ML PO SOLN: 5.0000 mg | ORAL | Status: DC | PRN

## 2013-11-25 NOTE — Patient Instructions (Signed)
Patient will follow-up at [facility] 2 weeks post operatively for diet advancement per MD.

## 2013-11-25 NOTE — Patient Instructions (Signed)
Congratulations on starting your journey to a healthier life! Over the next few weeks you will be undergoing tests (x-rays and labs) and seeing specialists to help evaluate you for weight loss surgery.  These tests and consultations with a psychologist and nutritionist are needed to prepare you for the lifestyle changes that lie ahead and are often required by insurance companies to approve you for surgery.   Pathway to Surgery:    Two weeks prior to surgery  Go on the extremely low carb liquid diet - this will decrease the size of your liver  which will make surgery safer  Attend preoperative appointment with your surgeon  Attend preoperative surgery class  One week prior to surgery  No aspirin products.  Tylenol is acceptable. Get your pain medicine prescription filled   24 hours prior to surgery  No alcoholic beverages  Report fever greater than 100.5 or excessive nasal drainage suggesting infection  Continue bariatric preop diet  Perform bowel prep if ordered  Do not eat or drink anything after midnight the night before surgery  Do not take any medications except those instructed by the anesthesiologist  Morning of surgery  Please arrive at the hospital at least 2 hours before your scheduled surgery time.  No makeup, fingernail polish or jewelry  Bring insurance cards with you  Bring your CPAP mask if you use this

## 2013-11-25 NOTE — Progress Notes (Signed)
Patient ID: Theresa [name], female   DOB: [birth year], 43 y.o.   MRN: [id number]  Chief Complaint  Patient presents with  . Bariatric Pre-op    pre op lap rnygb    HPI Theresa [name] is a 43 y.o. female.   HPI 43 year old morbidly obese Caucasian female comes in today for her preoperative appointment. She is currently scheduled to undergo a laparoscopic Roux-en-Y gastric bypass surgery on March 30. I initially saw her on November 20. At that time her weight was 251 pounds. She states that she put on weight after her seeing me because of the holidays. Her highest weight at 268 pounds. She started her preoperative diet and as are the lost 5 pounds. She was tested been evaluated for sleep apnea which was confirmed. She has started on CPAP therapy and reports more energy and less fatigue. Otherwise she denies any changes to her medical history since she was last seen. She denies any palpitations or heart racing.  Past Medical History  Diagnosis Date  . Bipolar disorder   . Attention deficit disorder   . Hypertension   . Hyperlipidemia   . Sleep apnea, obstructive     Past Surgical History  Procedure Laterality Date  . Cesarean section    . Tubal ligation      Family History  Problem Relation Age of Onset  . Hypertension Mother   . Cancer Mother     breast  . Diabetes Mother   . Hyperlipidemia Mother   . Thyroid disease Mother   . Hypertension Father   . Diabetes Father     Social History History  Substance Use Topics  . Smoking status: Never Smoker   . Smokeless tobacco: Not on file  . Alcohol Use: Yes    No Known Allergies  Current Outpatient Prescriptions  Medication Sig Dispense Refill  . ALPRAZolam (XANAX) 1 MG tablet Take 1 tablet (1 mg total) by mouth 2 (two) times daily as needed.  60 tablet  0  . atorvastatin (LIPITOR) 40 MG tablet Take 1 tablet by mouth daily.      . furosemide (LASIX) 40 MG tablet Take 1 tablet (40 mg total) by mouth as needed.  90 tablet  1   . lamoTRIgine (LAMICTAL) 150 MG tablet Take 1 tablet (150 mg total) by mouth daily.  90 tablet  1  . meloxicam (MOBIC) 15 MG tablet Take 1 tablet (15 mg total) by mouth daily.  30 tablet  0  . nebivolol (BYSTOLIC) 10 MG tablet Take 1 tablet (10 mg total) by mouth daily.  30 tablet  3  . solifenacin (VESICARE) 10 MG tablet Take 1 tablet (10 mg total) by mouth daily.  30 tablet  5  . oxyCODONE (ROXICODONE) 5 MG/5ML solution Take 5-10 mLs (5-10 mg total) by mouth every 4 (four) hours as needed for severe pain.  200 mL  0   No current facility-administered medications for this visit.    Review of Systems Review of Systems  Constitutional: Negative for fever, activity change, appetite change and unexpected weight change.  HENT: Negative for nosebleeds and trouble swallowing.   Eyes: Negative for photophobia and visual disturbance.  Respiratory: Negative for chest tightness and shortness of breath.        +OSA on CPAP  Cardiovascular: Negative for chest pain and leg swelling.       Denies CP, SOB, orthopnea, PND, some DOE  Gastrointestinal: Negative for vomiting, abdominal pain, diarrhea and constipation.       +  reflux.   Genitourinary: Negative for dysuria and difficulty urinating.  Musculoskeletal: Negative for arthralgias.       B/l ankle/knee pain  Skin: Negative for pallor and rash.  Neurological: Negative for dizziness, seizures, facial asymmetry and numbness.       Denies TIA and amaurosis fugax   Hematological: Negative for adenopathy. Does not bruise/bleed easily.  Psychiatric/Behavioral: Negative for behavioral problems and agitation.    Blood pressure 132/80, pulse 72, temperature 97.5 F (36.4 C), temperature source Temporal, resp. rate 16, height 5' 4"  (1.626 m), weight 258 lb 9.6 oz (117.3 kg).  Physical Exam Physical Exam  Data Reviewed My office note Dr. Loanne Drilling office note Bariatric evaluation labs-triglyceride 189, hemoglobin A1c 5.9, H. Pylori negative Upper  GI-within normal limits Abdominal ultrasound-within normal limits Dr. Janee Morn office note-March 15 Urine and plasma metanephrines-normal  Assessment    Morbid obesity BMI 44 Obstructive sleep apnea on CPAP Gastroesophageal reflux disease Bipolar disorder Hypertension Hyperlipidemia Stress urinary incontinence Abnormal hemoglobin A1c     Plan    We reviewed her workup to date including her imaging and lab studies. I am not sure about her remote history of diagnosis of having pheochromocytoma. We were never able to obtain any outside records that demonstrated this. The patient was unable to provide [place] with the name of the specialists she saw several years ago. Her primary care physician's office was also unable to provide [place] with the original consultation report in followup notes from the specialists. Therefore I obtained a new workup by our endocrinologist in town. There is no chemical evidence of the patient having pheochromocytoma. Her urine and plasma metanephrines are normal so therefore I think we can proceed with surgery safely.  We briefly rediscussed Roux-en-Y gastric bypass. We rediscussed the risk and benefits of surgery including but not limited to bleeding, infection, leak, blood clot formation, hernia, anesthesia complications, et Ronney Asters. She is encouraged to continue with her preoperative diet. We discussed the importance of adherence to the preoperative diet.  She was given her bowel prep as well as her prescription for her postoperative pain medication.  Leighton Ruff. Redmond Pulling, MD, FACS General, Bariatric, & Minimally Invasive Surgery [facility], [place]         [facility] M 11/25/2013, 2:03 PM

## 2013-11-25 NOTE — Progress Notes (Signed)
  Pre-Operative Nutrition Class:  Appt start time: 830   End time:  1030.  Patient was seen on 11/25/2013 for Pre-Operative Bariatric Surgery Education at [facility].   Surgery date: 12/06/2013 Surgery type: RYGB [facility] weight at [facility]: 264 lbs on 10/06/2013 Weight today: 257 lbs  TANITA  BODY COMP RESULTS  11/25/2013   BMI (kg/m^2) 44.1   Fat Mass (lbs) 135   Fat Free Mass (lbs) 122   Total Body Water (lbs) 89.5   Samples given per MNT protocol. Patient educated on appropriate usage:  Celebrate Multivitamin (grape) Lot #: [id number] Exp: 12/2014  Bariactiv Calcium Citrate (berry) Lot #: [id number] Exp: 01/2015  Premier protein shake (vanilla) Lot #: [id number] Exp: 06/2014  Renee Pain Protein Powder (chocolate) Lot #: [id number] Exp: 12/2014  The following the learning objectives were met by the patient during this course:  Identify Pre-Op Dietary Goals and will begin 2 weeks pre-operatively  Identify appropriate sources of fluids and proteins   State protein recommendations and appropriate sources pre and post-operatively  Identify Post-Operative Dietary Goals and will follow for 2 weeks post-operatively  Identify appropriate multivitamin and calcium sources  Describe the need for physical activity post-operatively and will follow MD recommendations  State when to call healthcare provider regarding medication questions or post-operative complications  Handouts given during class include:  Pre-Op Bariatric Surgery Diet Handout  Protein Shake Handout  Post-Op Bariatric Surgery Nutrition Handout  BELT Program Information Flyer  Support Group Information Flyer  [facility] Bariatric Supplements Price List  Follow-Up Plan: Patient will follow-up at [facility] 2 weeks post operatively for diet advancement per MD.

## 2013-11-26 ENCOUNTER — Encounter (HOSPITAL_COMMUNITY): Payer: Self-pay | Admitting: Pharmacy Technician

## 2013-11-29 ENCOUNTER — Encounter (HOSPITAL_COMMUNITY)
Admission: RE | Admit: 2013-11-29 | Discharge: 2013-11-29 | Disposition: A | Discharge status: Home / Self Care | Payer: BC Managed Care – PPO | Source: Ambulatory Visit | Attending: General Surgery | Admitting: General Surgery

## 2013-11-29 ENCOUNTER — Ambulatory Visit (HOSPITAL_COMMUNITY)
Admission: RE | Admit: 2013-11-29 | Discharge: 2013-11-29 | Disposition: A | Discharge status: Home / Self Care | Payer: BC Managed Care – PPO | Source: Ambulatory Visit | Attending: Anesthesiology | Admitting: Anesthesiology

## 2013-11-29 ENCOUNTER — Encounter (HOSPITAL_COMMUNITY): Payer: Self-pay

## 2013-11-29 DIAGNOSIS — I10 Essential (primary) hypertension: Secondary | ICD-10-CM | POA: Insufficient documentation

## 2013-11-29 DIAGNOSIS — Z01818 Encounter for other preprocedural examination: Secondary | ICD-10-CM | POA: Insufficient documentation

## 2013-11-29 DIAGNOSIS — Z01812 Encounter for preprocedural laboratory examination: Secondary | ICD-10-CM | POA: Insufficient documentation

## 2013-11-29 HISTORY — DX: Gastro-esophageal reflux disease without esophagitis: K21.9

## 2013-11-29 HISTORY — DX: Personal history of other medical treatment: Z92.89

## 2013-11-29 LAB — COMPREHENSIVE METABOLIC PANEL
ALBUMIN: 4.2 g/dL (ref 3.5–5.2)
ALK PHOS: 67 U/L (ref 39–117)
ALT: 24 U/L (ref 0–35)
AST: 18 U/L (ref 0–37)
BUN: 19 mg/dL (ref 6–23)
CALCIUM: 10.1 mg/dL (ref 8.4–10.5)
CO2: 27 mEq/L (ref 19–32)
Chloride: 98 mEq/L (ref 96–112)
Creatinine, Ser: 1 mg/dL (ref 0.50–1.10)
GFR calc non Af Amer: 69 mL/min — ABNORMAL LOW (ref >90)
GFR, EST AFRICAN AMERICAN: 81 mL/min — AB (ref >90)
Glucose, Bld: 131 mg/dL — ABNORMAL HIGH (ref 70–99)
POTASSIUM: 3.8 meq/L (ref 3.7–5.3)
SODIUM: 138 meq/L (ref 137–147)
TOTAL PROTEIN: 7.2 g/dL (ref 6.0–8.3)
Total Bilirubin: 0.4 mg/dL (ref 0.3–1.2)

## 2013-11-29 LAB — CBC WITH DIFFERENTIAL/PLATELET
BASOS ABS: 0 10*3/uL (ref 0.0–0.1)
BASOS PCT: 0 % (ref 0–1)
EOS ABS: 0.1 10*3/uL (ref 0.0–0.7)
EOS PCT: 1 % (ref 0–5)
HCT: 37.2 % (ref 36.0–46.0)
Hemoglobin: 12.6 g/dL (ref 12.0–15.0)
LYMPHS ABS: 2 10*3/uL (ref 0.7–4.0)
Lymphocytes Relative: 32 % (ref 12–46)
MCH: 28.4 pg (ref 26.0–34.0)
MCHC: 33.9 g/dL (ref 30.0–36.0)
MCV: 84 fL (ref 78.0–100.0)
Monocytes Absolute: 0.4 10*3/uL (ref 0.1–1.0)
Monocytes Relative: 6 % (ref 3–12)
Neutro Abs: 3.8 10*3/uL (ref 1.7–7.7)
Neutrophils Relative %: 61 % (ref 43–77)
PLATELETS: 227 10*3/uL (ref 150–400)
RBC: 4.43 MIL/uL (ref 3.87–5.11)
RDW: 13.3 % (ref 11.5–15.5)
WBC: 6.3 10*3/uL (ref 4.0–10.5)

## 2013-11-29 IMAGING — CR DG CHEST 2V
2 series · 2 of 2 positions shown · non-contrast
Comparison: [DATE].

CLINICAL DATA: Hypertension.

EXAM:
CHEST  2 VIEW

[w chest pa]
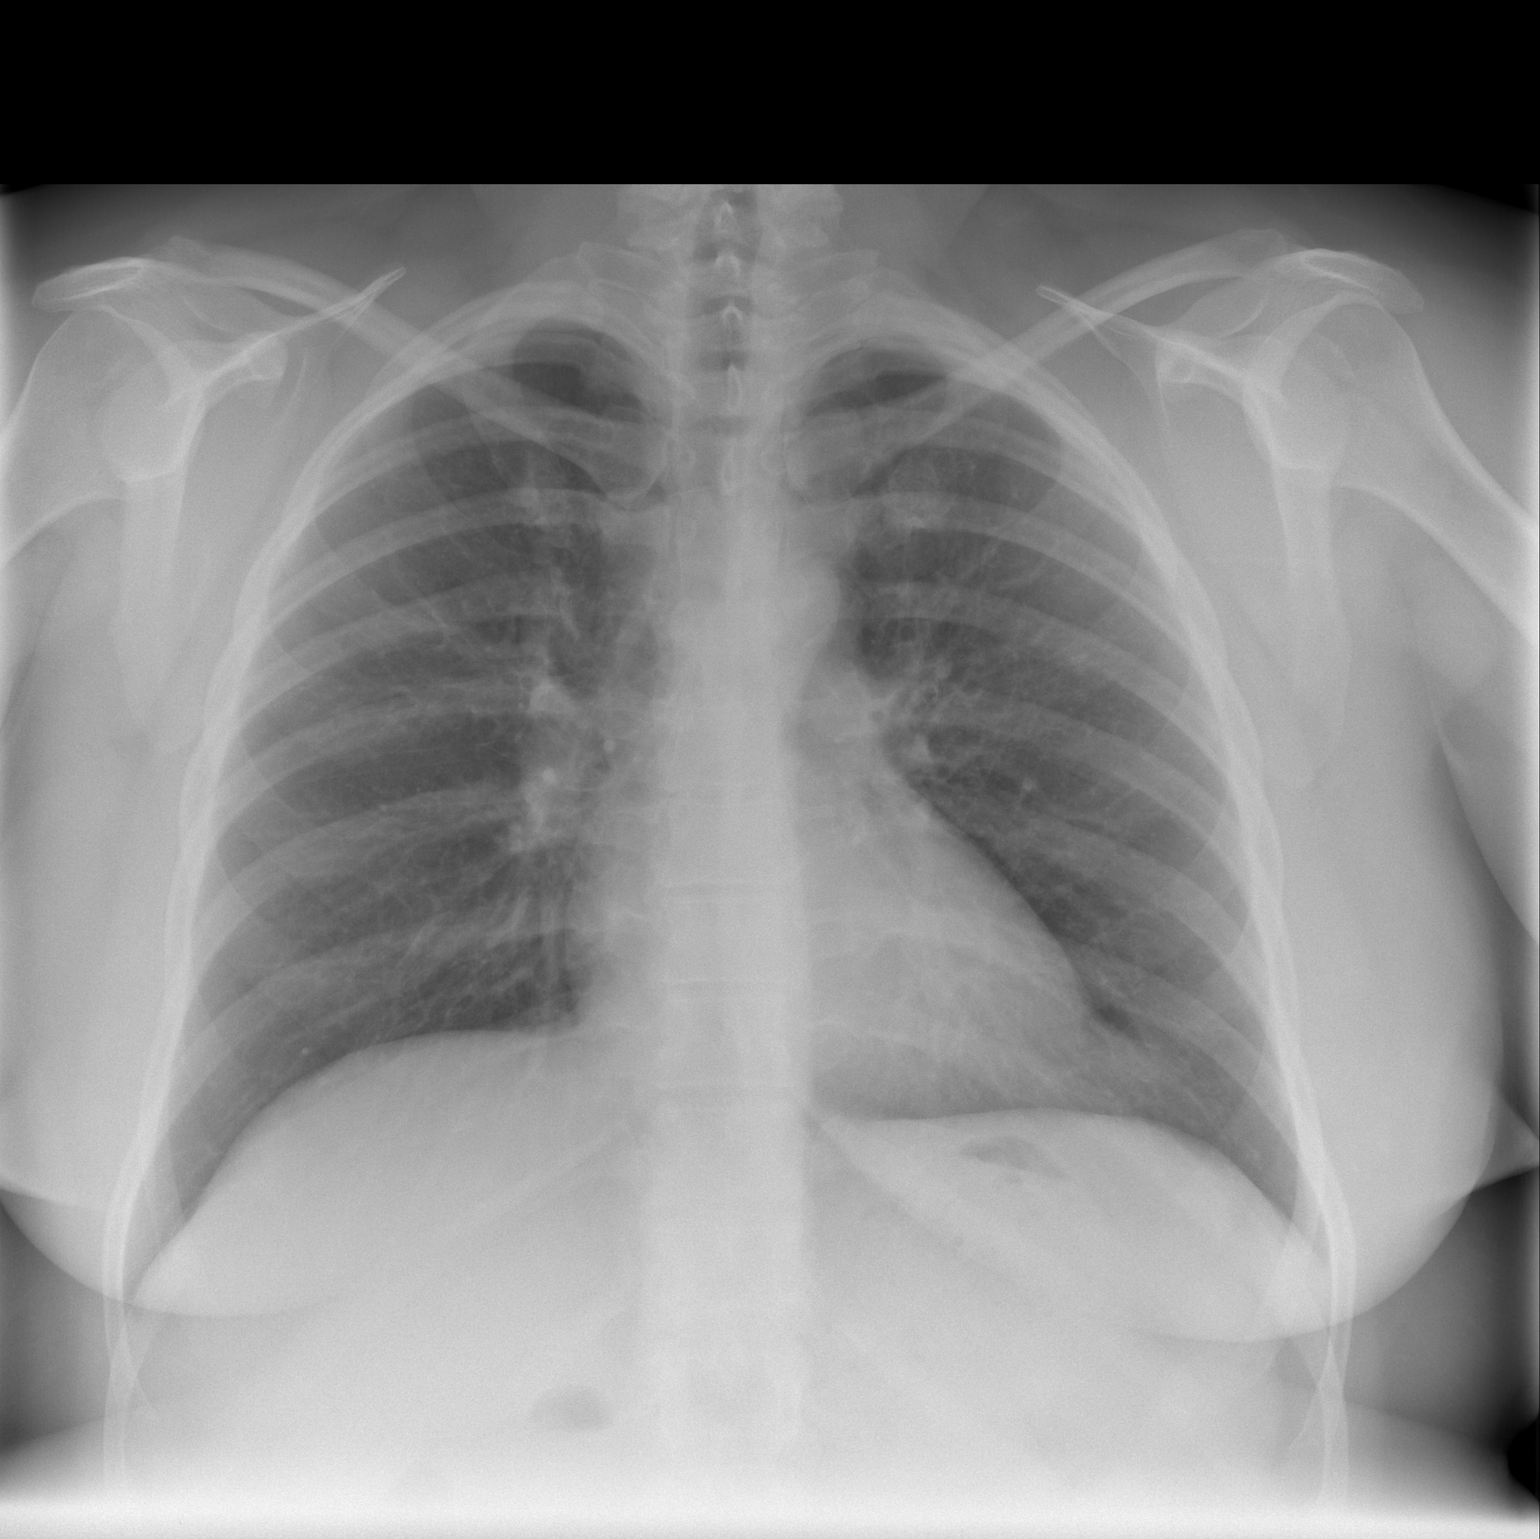

[w chest lat]
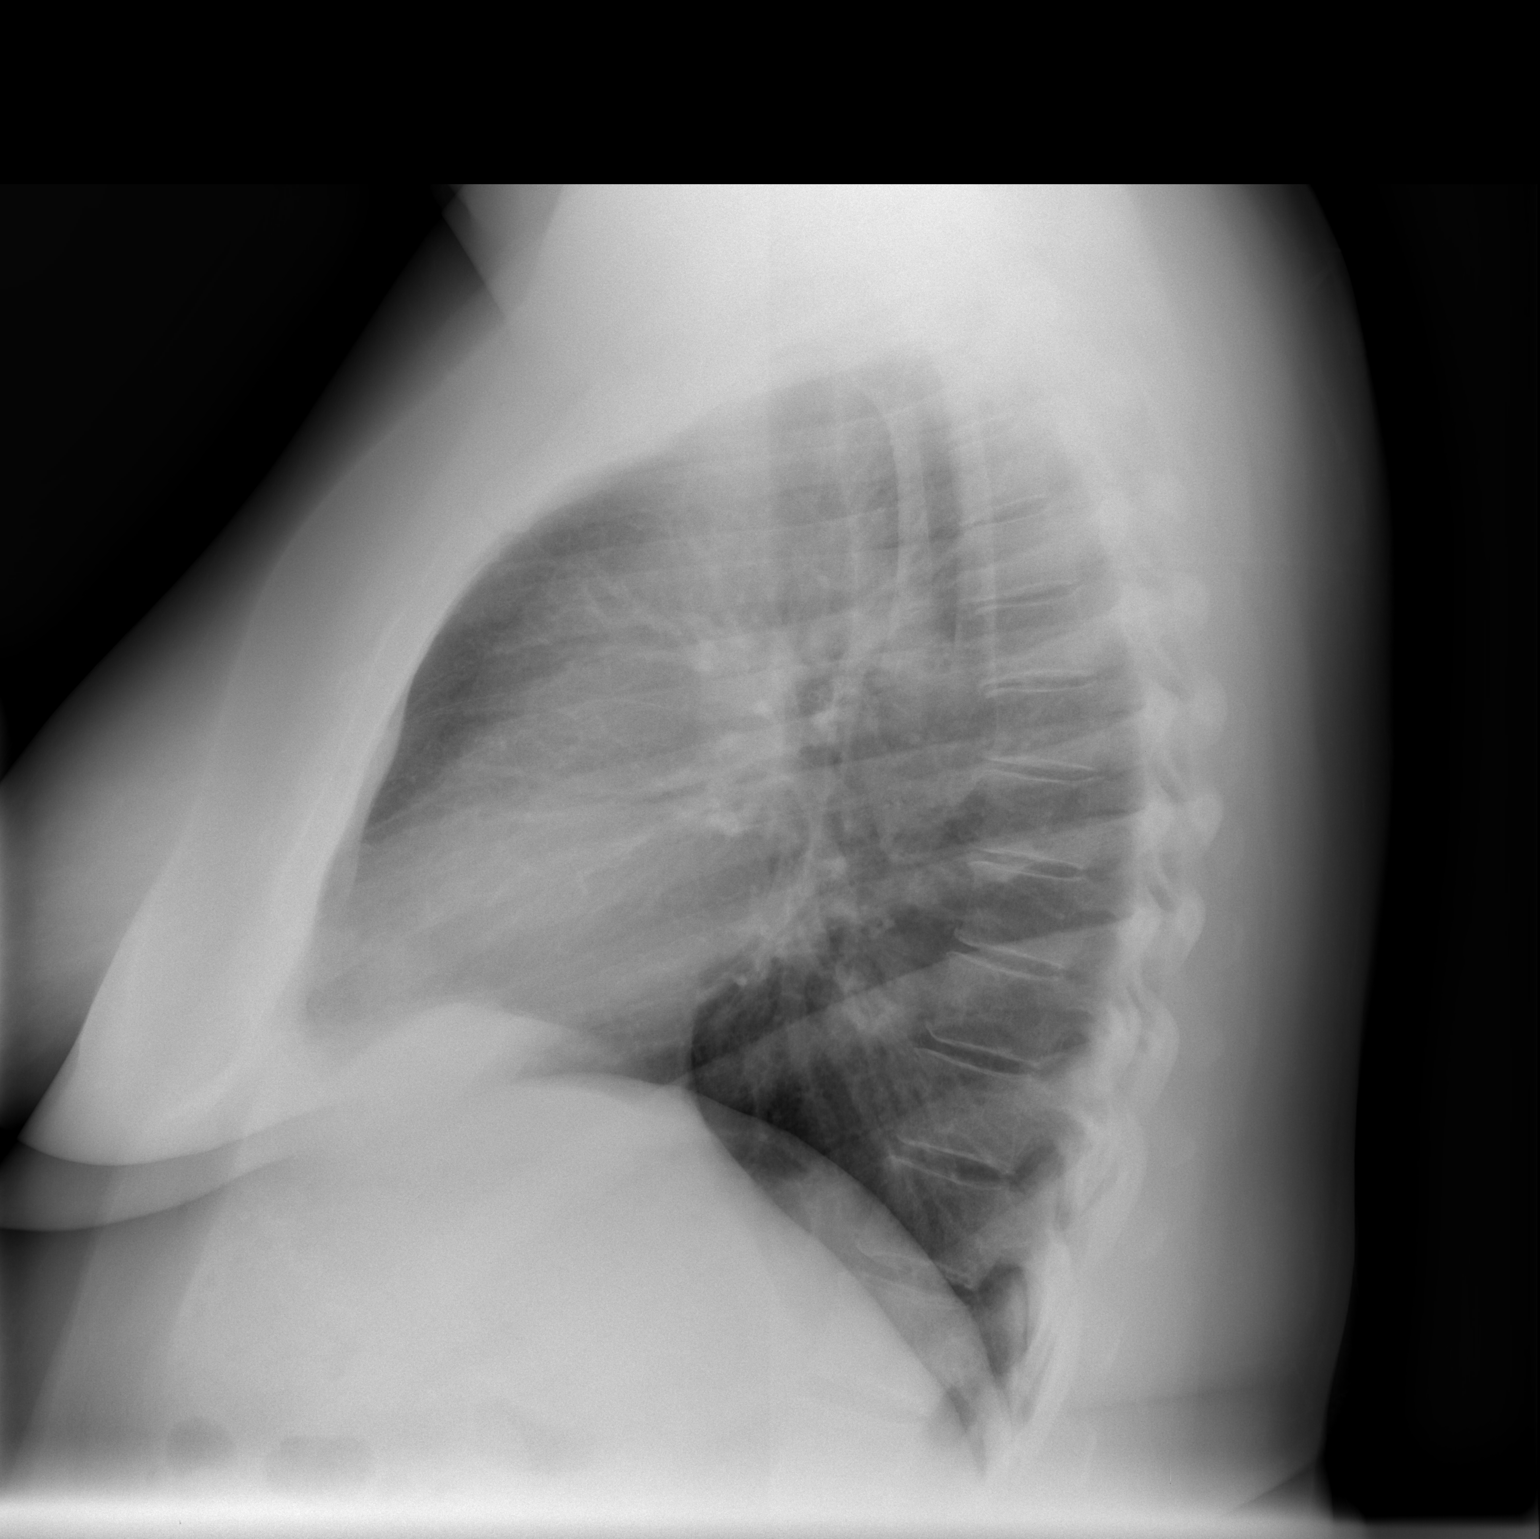

[2 of 2 positions shown; findings below may reference images not displayed]

FINDINGS: The heart size and mediastinal contours are within normal limits.
Both lungs are clear. The visualized skeletal structures are
unremarkable.
IMPRESSION: No acute cardiopulmonary abnormality seen.

## 2013-11-29 NOTE — Patient Instructions (Addendum)
[name]  11/29/2013   Your procedure is scheduled on:   12-06-2013  Report to [facility] at       [place] AM.  Call this number if you have problems the morning of surgery: [phone]  Or Presurgical Testing [phone](Khamari Sheehan) For Living Will and/or Health Care Power Attorney Forms: please provide copy for your medical record,may bring AM of surgery(Forms should be already notarized -we do not provide this service).( 11-29-13 Patient given information today.)  Remember: Follow any bowel prep instructions per MD office. For Cpap use: Bring mask and tubing only.   Do not eat food:After Midnight.    Take these medicines the morning of surgery with A SIP OF WATER: Lamictal. Bystolic. Lipitor.    Do not wear jewelry, make-up or nail polish.  Do not wear lotions, powders, or perfumes. You may wear deodorant.  Do not shave 48 hours(2 days) prior to first CHG shower(legs and under arms).(Shaving face and neck okay.)  Do not bring valuables to the hospital.(Hospital is not responsible for lost valuables).  Contacts, dentures or removable bridgework, body piercing, hair pins may not be worn into surgery.  Leave suitcase in the car. After surgery it may be brought to your room.  For patients admitted to the hospital, checkout time is 11:00 AM the day of discharge.(Restricted visitors-Any Persons displaying flu-like symptoms or illness).    Patients discharged the day of surgery will not be allowed to drive home. Must have responsible person with you x 24 hours once discharged.  Name and phone number of your driver: Arty Baumgartner [phone] cell  Special Instructions: CHG(Chlorhedine 4%-"Hibiclens","Betasept","Aplicare") Shower Use Special Wash: see special instructions.(avoid face and genitals)   Please read over the following fact sheets that you were given: Incentive Spirometry Instruction.  Remember : Type/Screen "Blue armbands" - may not be removed once  applied(would result in being retested AM of surgery, if removed).  Failure to follow these instructions may result in Cancellation of your surgery.   Patient signature_______________________________________________________

## 2013-11-29 NOTE — Pre-Procedure Instructions (Signed)
11-29-13 EKG 10-18-13 Epic [place] abd 2'15 -Epic. CXR done today.

## 2013-12-06 ENCOUNTER — Inpatient Hospital Stay (HOSPITAL_COMMUNITY): Payer: BC Managed Care – PPO | Admitting: Anesthesiology

## 2013-12-06 ENCOUNTER — Encounter (HOSPITAL_COMMUNITY): Payer: BC Managed Care – PPO | Admitting: Anesthesiology

## 2013-12-06 ENCOUNTER — Inpatient Hospital Stay (HOSPITAL_COMMUNITY)
Admission: RE | Admit: 2013-12-06 | Discharge: 2013-12-09 | DRG: 621 | Disposition: A | Discharge status: Home / Self Care | Payer: BC Managed Care – PPO | Source: Ambulatory Visit | Attending: General Surgery | Admitting: General Surgery

## 2013-12-06 ENCOUNTER — Encounter (HOSPITAL_COMMUNITY): Payer: Self-pay | Admitting: *Deleted

## 2013-12-06 ENCOUNTER — Encounter (HOSPITAL_COMMUNITY)
Admission: RE | Disposition: A | Discharge status: Home / Self Care | Payer: Self-pay | Source: Ambulatory Visit | Attending: General Surgery

## 2013-12-06 DIAGNOSIS — R143 Flatulence: Secondary | ICD-10-CM

## 2013-12-06 DIAGNOSIS — Z9884 Bariatric surgery status: Secondary | ICD-10-CM

## 2013-12-06 DIAGNOSIS — Z6841 Body Mass Index (BMI) 40.0 and over, adult: Secondary | ICD-10-CM

## 2013-12-06 DIAGNOSIS — R142 Eructation: Secondary | ICD-10-CM | POA: Diagnosis present

## 2013-12-06 DIAGNOSIS — I10 Essential (primary) hypertension: Secondary | ICD-10-CM | POA: Diagnosis present

## 2013-12-06 DIAGNOSIS — K219 Gastro-esophageal reflux disease without esophagitis: Secondary | ICD-10-CM | POA: Diagnosis present

## 2013-12-06 DIAGNOSIS — F319 Bipolar disorder, unspecified: Secondary | ICD-10-CM | POA: Diagnosis present

## 2013-12-06 DIAGNOSIS — R141 Gas pain: Secondary | ICD-10-CM | POA: Diagnosis present

## 2013-12-06 DIAGNOSIS — G4733 Obstructive sleep apnea (adult) (pediatric): Secondary | ICD-10-CM | POA: Diagnosis present

## 2013-12-06 DIAGNOSIS — R339 Retention of urine, unspecified: Secondary | ICD-10-CM | POA: Diagnosis present

## 2013-12-06 DIAGNOSIS — Z833 Family history of diabetes mellitus: Secondary | ICD-10-CM

## 2013-12-06 DIAGNOSIS — N393 Stress incontinence (female) (male): Secondary | ICD-10-CM | POA: Diagnosis present

## 2013-12-06 DIAGNOSIS — R12 Heartburn: Secondary | ICD-10-CM | POA: Diagnosis present

## 2013-12-06 DIAGNOSIS — F988 Other specified behavioral and emotional disorders with onset usually occurring in childhood and adolescence: Secondary | ICD-10-CM | POA: Diagnosis present

## 2013-12-06 DIAGNOSIS — E785 Hyperlipidemia, unspecified: Secondary | ICD-10-CM | POA: Diagnosis present

## 2013-12-06 DIAGNOSIS — Z8249 Family history of ischemic heart disease and other diseases of the circulatory system: Secondary | ICD-10-CM

## 2013-12-06 DIAGNOSIS — K2100 Gastro-esophageal reflux disease with esophagitis, without bleeding: Secondary | ICD-10-CM

## 2013-12-06 DIAGNOSIS — F3162 Bipolar disorder, current episode mixed, moderate: Secondary | ICD-10-CM | POA: Diagnosis present

## 2013-12-06 HISTORY — PX: GASTRIC ROUX-EN-Y: SHX5262

## 2013-12-06 LAB — HEMOGLOBIN AND HEMATOCRIT, BLOOD
HEMATOCRIT: 35.8 % — AB (ref 36.0–46.0)
Hemoglobin: 11.8 g/dL — ABNORMAL LOW (ref 12.0–15.0)

## 2013-12-06 LAB — PREGNANCY, URINE: PREG TEST UR: NEGATIVE

## 2013-12-06 SURGERY — LAPAROSCOPIC ROUX-EN-Y GASTRIC BYPASS WITH UPPER ENDOSCOPY
Anesthesia: General | Site: Abdomen

## 2013-12-06 MED ORDER — FENTANYL CITRATE 0.05 MG/ML IJ SOLN
INTRAMUSCULAR | Status: DC | PRN
  Administered 2013-12-06 (×3): 50 ug via INTRAVENOUS
  Administered 2013-12-06: 100 ug via INTRAVENOUS

## 2013-12-06 MED ORDER — ONDANSETRON HCL 4 MG/2ML IJ SOLN
INTRAMUSCULAR | Status: AC
  Filled 2013-12-06: qty 2

## 2013-12-06 MED ORDER — KCL IN DEXTROSE-NACL 20-5-0.45 MEQ/L-%-% IV SOLN
INTRAVENOUS | Status: DC
  Administered 2013-12-06 – 2013-12-07 (×2): 125 mL via INTRAVENOUS
  Administered 2013-12-07 – 2013-12-08 (×2): via INTRAVENOUS
  Filled 2013-12-06 (×10): qty 1000

## 2013-12-06 MED ORDER — PROPOFOL 10 MG/ML IV BOLUS
INTRAVENOUS | Status: AC
  Filled 2013-12-06: qty 20

## 2013-12-06 MED ORDER — MORPHINE SULFATE 2 MG/ML IJ SOLN
2.0000 mg | INTRAMUSCULAR | Status: DC | PRN
  Administered 2013-12-06: 4 mg via INTRAVENOUS
  Administered 2013-12-06: 2 mg via INTRAVENOUS
  Administered 2013-12-06: 4 mg via INTRAVENOUS
  Administered 2013-12-06: 6 mg via INTRAVENOUS
  Administered 2013-12-07: 4 mg via INTRAVENOUS
  Administered 2013-12-07: 2 mg via INTRAVENOUS
  Administered 2013-12-07: 4 mg via INTRAVENOUS
  Administered 2013-12-07: 2 mg via INTRAVENOUS
  Administered 2013-12-07 (×2): 6 mg via INTRAVENOUS
  Administered 2013-12-07: 4 mg via INTRAVENOUS
  Administered 2013-12-08: 2 mg via INTRAVENOUS
  Filled 2013-12-06: qty 1
  Filled 2013-12-06: qty 3
  Filled 2013-12-06 (×3): qty 2
  Filled 2013-12-06: qty 1
  Filled 2013-12-06 (×2): qty 3
  Filled 2013-12-06 (×2): qty 2
  Filled 2013-12-06 (×2): qty 1

## 2013-12-06 MED ORDER — ACETAMINOPHEN 160 MG/5ML PO SOLN: 325.0000 mg | ORAL | Status: DC | PRN

## 2013-12-06 MED ORDER — LIDOCAINE HCL (CARDIAC) 20 MG/ML IV SOLN
INTRAVENOUS | Status: AC
  Filled 2013-12-06: qty 5

## 2013-12-06 MED ORDER — LACTATED RINGERS IR SOLN
Status: DC | PRN
  Administered 2013-12-06: 1000 mL

## 2013-12-06 MED ORDER — CHLORHEXIDINE GLUCONATE 4 % EX LIQD: 60.0000 mL | Freq: Once | CUTANEOUS | Status: DC

## 2013-12-06 MED ORDER — LIDOCAINE HCL (CARDIAC) 20 MG/ML IV SOLN
INTRAVENOUS | Status: DC | PRN
  Administered 2013-12-06: 60 mg via INTRAVENOUS

## 2013-12-06 MED ORDER — ACETAMINOPHEN 160 MG/5ML PO SOLN
650.0000 mg | ORAL | Status: DC | PRN
Start: 2013-12-07 — End: 2013-12-09
  Administered 2013-12-09: 650 mg via ORAL
  Filled 2013-12-06: qty 20.3

## 2013-12-06 MED ORDER — UNJURY CHOCOLATE CLASSIC POWDER: 2.0000 [oz_av] | Freq: Four times a day (QID) | ORAL | Status: DC

## 2013-12-06 MED ORDER — 0.9 % SODIUM CHLORIDE (POUR BTL) OPTIME
TOPICAL | Status: DC | PRN
  Administered 2013-12-06: 1000 mL

## 2013-12-06 MED ORDER — OXYCODONE HCL 5 MG PO TABS: 5.0000 mg | ORAL_TABLET | Freq: Once | ORAL | Status: DC | PRN

## 2013-12-06 MED ORDER — GLYCOPYRROLATE 0.2 MG/ML IJ SOLN
INTRAMUSCULAR | Status: DC | PRN
  Administered 2013-12-06: .8 mg via INTRAVENOUS

## 2013-12-06 MED ORDER — HYDROMORPHONE HCL PF 1 MG/ML IJ SOLN
INTRAMUSCULAR | Status: AC
  Filled 2013-12-06: qty 1

## 2013-12-06 MED ORDER — DEXTROSE 5 % IV SOLN
INTRAVENOUS | Status: AC
  Filled 2013-12-06: qty 2

## 2013-12-06 MED ORDER — OXYCODONE HCL 5 MG/5ML PO SOLN
5.0000 mg | Freq: Once | ORAL | Status: DC | PRN
  Filled 2013-12-06: qty 5

## 2013-12-06 MED ORDER — TISSEEL VH 10 ML EX KIT
PACK | CUTANEOUS | Status: DC | PRN
  Administered 2013-12-06: 10 mL

## 2013-12-06 MED ORDER — FENTANYL CITRATE 0.05 MG/ML IJ SOLN
INTRAMUSCULAR | Status: AC
  Filled 2013-12-06: qty 5

## 2013-12-06 MED ORDER — PROPOFOL 10 MG/ML IV BOLUS
INTRAVENOUS | Status: DC | PRN
  Administered 2013-12-06: 200 mg via INTRAVENOUS

## 2013-12-06 MED ORDER — ONDANSETRON HCL 4 MG/2ML IJ SOLN
4.0000 mg | INTRAMUSCULAR | Status: DC | PRN
  Administered 2013-12-06 – 2013-12-08 (×6): 4 mg via INTRAVENOUS
  Filled 2013-12-06 (×6): qty 2

## 2013-12-06 MED ORDER — LACTATED RINGERS IV SOLN
INTRAVENOUS | Status: DC
  Administered 2013-12-06: 1000 mL via INTRAVENOUS

## 2013-12-06 MED ORDER — NEOSTIGMINE METHYLSULFATE 1 MG/ML IJ SOLN
INTRAMUSCULAR | Status: DC | PRN
  Administered 2013-12-06: 5 mg via INTRAVENOUS

## 2013-12-06 MED ORDER — ROCURONIUM BROMIDE 100 MG/10ML IV SOLN
INTRAVENOUS | Status: AC
  Filled 2013-12-06: qty 1

## 2013-12-06 MED ORDER — ROCURONIUM BROMIDE 100 MG/10ML IV SOLN
INTRAVENOUS | Status: DC | PRN
  Administered 2013-12-06: 30 mg via INTRAVENOUS
  Administered 2013-12-06 (×5): 10 mg via INTRAVENOUS

## 2013-12-06 MED ORDER — ONDANSETRON HCL 4 MG/2ML IJ SOLN
INTRAMUSCULAR | Status: DC | PRN
  Administered 2013-12-06: 4 mg via INTRAVENOUS

## 2013-12-06 MED ORDER — LACTATED RINGERS IV SOLN
INTRAVENOUS | Status: DC | PRN
  Administered 2013-12-06: 11:00:00 via INTRAVENOUS

## 2013-12-06 MED ORDER — SODIUM CHLORIDE 0.9 % IJ SOLN
INTRAMUSCULAR | Status: AC
  Filled 2013-12-06: qty 10

## 2013-12-06 MED ORDER — FENTANYL CITRATE 0.05 MG/ML IJ SOLN
25.0000 ug | INTRAMUSCULAR | Status: DC | PRN
  Administered 2013-12-06 (×2): 50 ug via INTRAVENOUS

## 2013-12-06 MED ORDER — HYDROMORPHONE HCL PF 1 MG/ML IJ SOLN
INTRAMUSCULAR | Status: DC | PRN
  Administered 2013-12-06 (×5): .4 mg via INTRAVENOUS

## 2013-12-06 MED ORDER — SUCCINYLCHOLINE CHLORIDE 20 MG/ML IJ SOLN
INTRAMUSCULAR | Status: DC | PRN
  Administered 2013-12-06: 100 mg via INTRAVENOUS

## 2013-12-06 MED ORDER — HEPARIN SODIUM (PORCINE) 5000 UNIT/ML IJ SOLN
5000.0000 [IU] | INTRAMUSCULAR | Status: AC
  Administered 2013-12-06: 5000 [IU] via SUBCUTANEOUS
  Filled 2013-12-06: qty 1

## 2013-12-06 MED ORDER — MEPERIDINE HCL 50 MG/ML IJ SOLN: 6.2500 mg | INTRAMUSCULAR | Status: DC | PRN

## 2013-12-06 MED ORDER — BUPIVACAINE-EPINEPHRINE PF 0.25-1:200000 % IJ SOLN
INTRAMUSCULAR | Status: AC
  Filled 2013-12-06: qty 30

## 2013-12-06 MED ORDER — BUPIVACAINE-EPINEPHRINE 0.25% -1:200000 IJ SOLN
INTRAMUSCULAR | Status: DC | PRN
  Administered 2013-12-06: 25 mL

## 2013-12-06 MED ORDER — STERILE WATER FOR IRRIGATION IR SOLN
Status: DC | PRN
  Administered 2013-12-06: 1500 mL

## 2013-12-06 MED ORDER — GLYCOPYRROLATE 0.2 MG/ML IJ SOLN
INTRAMUSCULAR | Status: AC
  Filled 2013-12-06: qty 4

## 2013-12-06 MED ORDER — MIDAZOLAM HCL 2 MG/2ML IJ SOLN
INTRAMUSCULAR | Status: AC
  Filled 2013-12-06: qty 2

## 2013-12-06 MED ORDER — FENTANYL CITRATE 0.05 MG/ML IJ SOLN
INTRAMUSCULAR | Status: AC
  Filled 2013-12-06: qty 2

## 2013-12-06 MED ORDER — ACETAMINOPHEN 10 MG/ML IV SOLN
1000.0000 mg | Freq: Once | INTRAVENOUS | Status: AC
  Administered 2013-12-06: 1000 mg via INTRAVENOUS
  Filled 2013-12-06: qty 100

## 2013-12-06 MED ORDER — PROMETHAZINE HCL 25 MG/ML IJ SOLN
INTRAMUSCULAR | Status: AC
  Filled 2013-12-06: qty 1

## 2013-12-06 MED ORDER — MIDAZOLAM HCL 5 MG/5ML IJ SOLN
INTRAMUSCULAR | Status: DC | PRN
  Administered 2013-12-06 (×2): 1 mg via INTRAVENOUS

## 2013-12-06 MED ORDER — UNJURY CHICKEN SOUP POWDER
2.0000 [oz_av] | Freq: Four times a day (QID) | ORAL | Status: DC
  Administered 2013-12-08 – 2013-12-09 (×3): 2 [oz_av] via ORAL

## 2013-12-06 MED ORDER — METOPROLOL TARTRATE 1 MG/ML IV SOLN
5.0000 mg | Freq: Three times a day (TID) | INTRAVENOUS | Status: DC
  Administered 2013-12-06 – 2013-12-07 (×3): 5 mg via INTRAVENOUS
  Filled 2013-12-06 (×5): qty 5

## 2013-12-06 MED ORDER — FAMOTIDINE IN NACL 20-0.9 MG/50ML-% IV SOLN
20.0000 mg | INTRAVENOUS | Status: DC
  Administered 2013-12-06 – 2013-12-08 (×3): 20 mg via INTRAVENOUS
  Filled 2013-12-06 (×5): qty 50

## 2013-12-06 MED ORDER — DEXAMETHASONE SODIUM PHOSPHATE 10 MG/ML IJ SOLN
INTRAMUSCULAR | Status: DC | PRN
  Administered 2013-12-06: 10 mg via INTRAVENOUS

## 2013-12-06 MED ORDER — UNJURY VANILLA POWDER: 2.0000 [oz_av] | Freq: Four times a day (QID) | ORAL | Status: DC

## 2013-12-06 MED ORDER — OXYCODONE HCL 5 MG/5ML PO SOLN
5.0000 mg | ORAL | Status: DC | PRN
  Administered 2013-12-07 – 2013-12-08 (×6): 10 mg via ORAL
  Filled 2013-12-06 (×2): qty 10
  Filled 2013-12-06: qty 50
  Filled 2013-12-06: qty 10
  Filled 2013-12-06 (×2): qty 50

## 2013-12-06 MED ORDER — HYDROMORPHONE HCL PF 1 MG/ML IJ SOLN
0.2500 mg | INTRAMUSCULAR | Status: DC | PRN
  Administered 2013-12-06 (×3): 0.5 mg via INTRAVENOUS

## 2013-12-06 MED ORDER — TISSEEL VH 10 ML EX KIT
PACK | CUTANEOUS | Status: AC
  Filled 2013-12-06: qty 2

## 2013-12-06 MED ORDER — PROMETHAZINE HCL 25 MG/ML IJ SOLN
6.2500 mg | INTRAMUSCULAR | Status: DC | PRN
  Administered 2013-12-06: 6.25 mg via INTRAVENOUS

## 2013-12-06 MED ORDER — ENOXAPARIN SODIUM 40 MG/0.4ML ~~LOC~~ SOLN
40.0000 mg | Freq: Two times a day (BID) | SUBCUTANEOUS | Status: DC
  Administered 2013-12-07 – 2013-12-09 (×5): 40 mg via SUBCUTANEOUS
  Filled 2013-12-06 (×8): qty 0.4

## 2013-12-06 MED ORDER — HYDROMORPHONE HCL PF 2 MG/ML IJ SOLN
INTRAMUSCULAR | Status: AC
  Filled 2013-12-06: qty 1

## 2013-12-06 MED ORDER — CEFOXITIN SODIUM 2 G IV SOLR
2.0000 g | INTRAVENOUS | Status: AC
  Administered 2013-12-06 (×2): 2 g via INTRAVENOUS

## 2013-12-06 SURGICAL SUPPLY — 61 items
ADH SKN CLS APL DERMABOND .7 (GAUZE/BANDAGES/DRESSINGS)
APL SRG 32X5 SNPLK LF DISP (MISCELLANEOUS) ×1
APPLICATOR COTTON TIP 6IN STRL (MISCELLANEOUS) ×2 IMPLANT
BLADE SURG SZ11 CARB STEEL (BLADE) ×3 IMPLANT
CABLE HIGH FREQUENCY MONO STRZ (ELECTRODE) ×2 IMPLANT
CHLORAPREP W/TINT 26ML (MISCELLANEOUS) ×6 IMPLANT
CLIP SUT LAPRA TY ABSORB (SUTURE) ×8 IMPLANT
CUTTER LINEAR ENDO ART 45 ETS (STAPLE) ×3 IMPLANT
DERMABOND ADVANCED (GAUZE/BANDAGES/DRESSINGS)
DERMABOND ADVANCED .7 DNX12 (GAUZE/BANDAGES/DRESSINGS) IMPLANT
DEVICE SUTURE ENDOST 10MM (ENDOMECHANICALS) ×3 IMPLANT
DRAIN PENROSE 18X1/4 LTX STRL (WOUND CARE) ×3 IMPLANT
DRAPE CAMERA CLOSED 9X96 (DRAPES) ×3 IMPLANT
ELECT REM PT RETURN 9FT ADLT (ELECTROSURGICAL) ×3
ELECTRODE REM PT RTRN 9FT ADLT (ELECTROSURGICAL) ×1 IMPLANT
GAUZE SPONGE 4X4 16PLY XRAY LF (GAUZE/BANDAGES/DRESSINGS) ×3 IMPLANT
GLOVE BIOGEL M STRL SZ7.5 (GLOVE) IMPLANT
GOWN STRL REUS W/TWL XL LVL3 (GOWN DISPOSABLE) ×12 IMPLANT
HOVERMATT SINGLE USE (MISCELLANEOUS) ×3 IMPLANT
KIT BASIN OR (CUSTOM PROCEDURE TRAY) ×3 IMPLANT
KIT GASTRIC LAVAGE 34FR ADT (SET/KITS/TRAYS/PACK) ×3 IMPLANT
MARKER SKIN DUAL TIP RULER LAB (MISCELLANEOUS) ×3 IMPLANT
NDL SPNL 22GX3.5 QUINCKE BK (NEEDLE) ×1 IMPLANT
NEEDLE SPNL 22GX3.5 QUINCKE BK (NEEDLE) ×3 IMPLANT
PACK CARDIOVASCULAR III (CUSTOM PROCEDURE TRAY) ×3 IMPLANT
RELOAD 45 VASCULAR/THIN (ENDOMECHANICALS) ×3 IMPLANT
RELOAD BLUE (STAPLE) ×6 IMPLANT
RELOAD ENDO STITCH 2.0 (ENDOMECHANICALS) ×24
RELOAD GOLD (STAPLE) ×3 IMPLANT
RELOAD STAPLE 45 3.5 BLU ETS (ENDOMECHANICALS) ×1 IMPLANT
RELOAD STAPLE TA45 3.5 REG BLU (ENDOMECHANICALS) ×6 IMPLANT
RELOAD SUT SNGL STCH ABSRB 2-0 (ENDOMECHANICALS) ×4 IMPLANT
RELOAD SUT SNGL STCH BLK 2-0 (ENDOMECHANICALS) ×4 IMPLANT
RELOAD WHITE ECR60W (STAPLE) ×3 IMPLANT
SCISSORS LAP 5X35 DISP (ENDOMECHANICALS) ×3 IMPLANT
SEALANT SURGICAL APPL DUAL CAN (MISCELLANEOUS) ×3 IMPLANT
SET IRRIG TUBING LAPAROSCOPIC (IRRIGATION / IRRIGATOR) ×3 IMPLANT
SHEARS HARMONIC ACE PLUS 45CM (MISCELLANEOUS) ×3 IMPLANT
SLEEVE ADV FIXATION 12X100MM (TROCAR) ×6 IMPLANT
SOLUTION ANTI FOG 6CC (MISCELLANEOUS) ×3 IMPLANT
SPONGE GAUZE 4X4 12PLY (GAUZE/BANDAGES/DRESSINGS) IMPLANT
STAPLE ECHEON FLEX 60 POW ENDO (STAPLE) ×3 IMPLANT
STAPLER VISISTAT 35W (STAPLE) IMPLANT
SUT DVC SILK 2.0X39 (SUTURE) ×2 IMPLANT
SUT MNCRL AB 4-0 PS2 18 (SUTURE) ×3 IMPLANT
SUT RELOAD ENDO STITCH 2 48X1 (ENDOMECHANICALS) ×6
SUT RELOAD ENDO STITCH 2.0 (ENDOMECHANICALS) ×2
SUT VIC AB 2-0 SH 27 (SUTURE) ×3
SUT VIC AB 2-0 SH 27X BRD (SUTURE) ×1 IMPLANT
SUTURE RELOAD END STTCH 2 48X1 (ENDOMECHANICALS) ×6 IMPLANT
SUTURE RELOAD ENDO STITCH 2.0 (ENDOMECHANICALS) ×2 IMPLANT
SYR 20CC LL (SYRINGE) ×6 IMPLANT
TOWEL OR 17X26 10 PK STRL BLUE (TOWEL DISPOSABLE) ×3 IMPLANT
TOWEL OR NON WOVEN STRL DISP B (DISPOSABLE) ×3 IMPLANT
TRAY FOLEY CATH 14FRSI W/METER (CATHETERS) ×3 IMPLANT
TROCAR ADV FIXATION 12X100MM (TROCAR) ×3 IMPLANT
TROCAR ADV FIXATION 5X100MM (TROCAR) ×3 IMPLANT
TROCAR BLADELESS OPT 5 100 (ENDOMECHANICALS) ×3 IMPLANT
TROCAR XCEL 12X100 BLDLESS (ENDOMECHANICALS) ×3 IMPLANT
TUBING ENDO SMARTCAP PENTAX (MISCELLANEOUS) ×3 IMPLANT
TUBING FILTER THERMOFLATOR (ELECTROSURGICAL) ×3 IMPLANT

## 2013-12-06 NOTE — Procedures (Signed)
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  The EG junction was about 42 cm from the teeth.  The pouch was about 4 cm in length.  No bleeding or external bubbles were seen.  The scope easily passed into the roux limb.  Decompression at the completion of the case.    Theresa [name] Done, [place], [facility], [place]

## 2013-12-06 NOTE — Transfer of Care (Signed)
Immediate Anesthesia Transfer of Care Note  Patient: Theresa [name]  Procedure(s) Performed: Procedure(s): LAPAROSCOPIC ROUX-EN-Y GASTRIC BYPASS WITH UPPER ENDOSCOPY (N/A)  Patient Location: PACU  Anesthesia Type:General  Level of Consciousness: awake, alert , oriented and patient cooperative  Airway & Oxygen Therapy: Patient Spontanous Breathing and Patient connected to face mask oxygen  Post-op Assessment: Report given to PACU RN, Post -op Vital signs reviewed and stable and Patient moving all extremities  Post vital signs: Reviewed and stable  Complications: No apparent anesthesia complications

## 2013-12-06 NOTE — Anesthesia Postprocedure Evaluation (Signed)
Anesthesia Post Note  Patient: Theresa [name]  Procedure(s) Performed: Procedure(s) (LRB): LAPAROSCOPIC ROUX-EN-Y GASTRIC BYPASS WITH UPPER ENDOSCOPY (N/A)  Anesthesia type: General  Patient location: PACU  Post pain: Pain level controlled  Post assessment: Post-op Vital signs reviewed  Last Vitals: BP 131/75  Pulse 68  Temp(Src) 36.7 C (Oral)  Resp 18  Ht 5\' 4"  (1.626 m)  Wt 249 lb 12.8 oz (113.309 kg)  BMI 42.86 kg/m2  SpO2 99%  LMP 10/10/2013  Post vital signs: Reviewed  Level of consciousness: sedated  Complications: No apparent anesthesia complications

## 2013-12-06 NOTE — Op Note (Signed)
Theresa [name] [id number] 04-22-1971. 12/06/2013  Preoperative diagnosis:  Morbid obesity BMI 43  Obstructive sleep apnea on CPAP  Gastroesophageal reflux disease  Bipolar disorder  Hypertension  Hyperlipidemia  Stress urinary incontinence  Abnormal hemoglobin A1c   Postoperative  diagnosis:  1. same  Surgical procedure: Laparoscopic Roux-en-Y gastric bypass (ante-colic, ante-gastric); upper endoscopy  Surgeon: Gayland Curry, M.D. FACS  Asst.: Johnathan Hausen, MD FACS  Anesthesia: General plus 0.25% marcaine with epi  Complications: None   EBL: Minimal   Drains: None   Disposition: PACU in good condition   Indications for procedure: 43yo WF with morbid obesity who has been unsuccessful at sustained weight loss. The patient's comorbidities are listed above. We discussed the risk and benefits of surgery including but not limited to anesthesia risk, bleeding, infection, blood clot formation, anastomotic leak, anastomotic stricture, ulcer formation, death, respiratory complications, intestinal blockage, internal hernia, gallstone formation, vitamin and nutritional deficiencies, injury to surrounding structures, failure to lose weight and mood changes.   Description of procedure: Patient is brought to the operating room and general anesthesia induced. The patient had received preoperative broad-spectrum IV antibiotics and subcutaneous heparin. The abdomen was widely sterilely prepped with Chloraprep and draped. Patient timeout was performed and correct patient and procedure confirmed. Access was obtained with a 12 mm Optiview trocar in the left upper quadrant and pneumoperitoneum established without difficulty. Under direct vision 12 mm trocars were placed laterally in the right upper quadrant, right upper quadrant midclavicular line, and to the left and above the umbilicus for the camera port. A 5 mm trocar was placed laterally in the left upper quadrant.  A 40 cm biliopancreatic limb was  then carefully measured from the ligament of Treitz. The small intestine was divided at this point with a single firing of the white load linear stapler. A Penrose drain was sutured to the end of the Roux-en-Y limb for later identification. A 100 cm Roux-en-Y limb was then carefully measured. At this point a side-to-side anastomosis was created between the Roux limb and the end of the biliopancreatic limb. This was accomplished with a single firing of the 45 mm white load linear stapler. The common enterotomy was closed with a running 2-0 Vicryl begun at either end of the enterotomy and tied centrally. Tisseel tissue sealant was placed over the anastomosis. The mesenteric defect was then closed with running 2-0 silk. The omentum was then divided with the harmonic scalpel up towards the transverse colon to allow mobility of the Roux limb toward the gastric pouch. The patient was then placed in steep reversed Trendelenburg. Through a 5 mm subxiphoid site the [facility] retractor was placed and the left lobe of the liver elevated with excellent exposure of the upper stomach and hiatus. The angle of Hiss was then mobilized with the harmonic scalpel. A 4 cm gastric pouch was then carefully measured along the lesser curve of the stomach. Dissection was carried along the lesser curve at this point with the Harmonic scalpel working carefully back toward the lesser sac at right angles to the lesser curve. The free lesser sac was then entered. After being sure all tubes were removed from the stomach an initial firing of the gold load 60 mm linear stapler was fired at right angles across the lesser curve for about 4 cm. The gastric pouch was further mobilized posteriorly and then the pouch was completed with 3 further firings of the 60 mm blue load linear stapler and 1 final firing of a blue load  48m linear stapler up through the previously dissected angle of His. It was ensured that the pouch was completely mobilized away  from the gastric remnant. This created a nice tubular 4-5 cm gastric pouch.  The Roux limb was then brought up in an antecolic fashion with the candycane facing to the patient's left without undue tension. The gastrojejunostomy was created with an initial posterior row of 2-0 Vicryl between the Roux limb and the staple line of the gastric pouch. Enterotomies were then made in the gastric pouch and the Roux limb with the harmonic scalpel and at approximately 2-2-1/2 cm anastomosis was created with a single firing of the 481mblue load linear stapler. The staple line was inspected and was intact without bleeding. The common enterotomy was then closed with running 2-0 Vicryl begun at either end and tied centrally. The Ewall tube was then easily passed through the anastomosis and an outer anterior layer of running 2-0 Vicryl was placed using the Endo360. The Ewald tube was removed. With the outlet of the gastrojejunostomy clamped and under saline irrigation the assistant performed upper endoscopy and with the gastric pouch tensely distended with air-there was no evidence of leak on this test. The pouch was desufflated. The PeTerance Hartefect was closed with running 2-0 silk. The abdomen was inspected for any evidence of bleeding or bowel injury and everything looked fine. The Nathanson retractor was removed under direct vision after coating the anastomosis with Tisseel tissue sealant. All CO2 was evacuated and trochars removed. Skin incisions were closed with 4-0 monocryl in a subcuticular fashion followed by Dermabond. Sponge needle and instrument counts were correct. The patient was taken to the PACU in good condition.    ErLeighton RuffWiRedmond PullingMD, FACS General, Bariatric, & Minimally Invasive Surgery [facility], [place]

## 2013-12-06 NOTE — Preoperative (Signed)
Beta Blockers   Reason not to administer Beta Blockers:Not Applicable 

## 2013-12-06 NOTE — H&P (View-Only) (Signed)
Patient ID: Theresa [name], female   DOB: 09/20/1970, 43 y.o.   MRN: [id number]  Chief Complaint  Patient presents with  . Bariatric Pre-op    pre op lap rnygb    HPI Theresa [name] is a 43 y.o. female.   HPI 43 year old morbidly obese Caucasian female comes in today for her preoperative appointment. She is currently scheduled to undergo a laparoscopic Roux-en-Y gastric bypass surgery on March 30. I initially saw her on November 20. At that time her weight was 251 pounds. She states that she put on weight after her seeing me because of the holidays. Her highest weight at 268 pounds. She started her preoperative diet and as are the lost 5 pounds. She was tested been evaluated for sleep apnea which was confirmed. She has started on CPAP therapy and reports more energy and less fatigue. Otherwise she denies any changes to her medical history since she was last seen. She denies any palpitations or heart racing.  Past Medical History  Diagnosis Date  . Bipolar disorder   . Attention deficit disorder   . Hypertension   . Hyperlipidemia   . Sleep apnea, obstructive     Past Surgical History  Procedure Laterality Date  . Cesarean section    . Tubal ligation      Family History  Problem Relation Age of Onset  . Hypertension Mother   . Cancer Mother     breast  . Diabetes Mother   . Hyperlipidemia Mother   . Thyroid disease Mother   . Hypertension Father   . Diabetes Father     Social History History  Substance Use Topics  . Smoking status: Never Smoker   . Smokeless tobacco: Not on file  . Alcohol Use: Yes    No Known Allergies  Current Outpatient Prescriptions  Medication Sig Dispense Refill  . ALPRAZolam (XANAX) 1 MG tablet Take 1 tablet (1 mg total) by mouth 2 (two) times daily as needed.  60 tablet  0  . atorvastatin (LIPITOR) 40 MG tablet Take 1 tablet by mouth daily.      . furosemide (LASIX) 40 MG tablet Take 1 tablet (40 mg total) by mouth as needed.  90 tablet  1   . lamoTRIgine (LAMICTAL) 150 MG tablet Take 1 tablet (150 mg total) by mouth daily.  90 tablet  1  . meloxicam (MOBIC) 15 MG tablet Take 1 tablet (15 mg total) by mouth daily.  30 tablet  0  . nebivolol (BYSTOLIC) 10 MG tablet Take 1 tablet (10 mg total) by mouth daily.  30 tablet  3  . solifenacin (VESICARE) 10 MG tablet Take 1 tablet (10 mg total) by mouth daily.  30 tablet  5  . oxyCODONE (ROXICODONE) 5 MG/5ML solution Take 5-10 mLs (5-10 mg total) by mouth every 4 (four) hours as needed for severe pain.  200 mL  0   No current facility-administered medications for this visit.    Review of Systems Review of Systems  Constitutional: Negative for fever, activity change, appetite change and unexpected weight change.  HENT: Negative for nosebleeds and trouble swallowing.   Eyes: Negative for photophobia and visual disturbance.  Respiratory: Negative for chest tightness and shortness of breath.        +OSA on CPAP  Cardiovascular: Negative for chest pain and leg swelling.       Denies CP, SOB, orthopnea, PND, some DOE  Gastrointestinal: Negative for vomiting, abdominal pain, diarrhea and constipation.       +  reflux.   Genitourinary: Negative for dysuria and difficulty urinating.  Musculoskeletal: Negative for arthralgias.       B/l ankle/knee pain  Skin: Negative for pallor and rash.  Neurological: Negative for dizziness, seizures, facial asymmetry and numbness.       Denies TIA and amaurosis fugax   Hematological: Negative for adenopathy. Does not bruise/bleed easily.  Psychiatric/Behavioral: Negative for behavioral problems and agitation.    Blood pressure 132/80, pulse 72, temperature 97.5 F (36.4 C), temperature source Temporal, resp. rate 16, height 5' 4"  (1.626 m), weight 258 lb 9.6 oz (117.3 kg).  Physical Exam Physical Exam  Data Reviewed My office note Dr. Loanne [name] office note Bariatric evaluation labs-triglyceride 189, hemoglobin A1c 5.9, H. Pylori negative Upper  GI-within normal limits Abdominal ultrasound-within normal limits Dr. Janee [name] office note-March 15 Urine and plasma metanephrines-normal  Assessment    Morbid obesity BMI 44 Obstructive sleep apnea on CPAP Gastroesophageal reflux disease Bipolar disorder Hypertension Hyperlipidemia Stress urinary incontinence Abnormal hemoglobin A1c     Plan    We reviewed her workup to date including her imaging and lab studies. I am not sure about her remote history of diagnosis of having pheochromocytoma. We were never able to obtain any outside records that demonstrated this. The patient was unable to provide [place] with the name of the specialists she saw several years ago. Her primary care physician's office was also unable to provide [place] with the original consultation report in followup notes from the specialists. Therefore I obtained a new workup by our endocrinologist in town. There is no chemical evidence of the patient having pheochromocytoma. Her urine and plasma metanephrines are normal so therefore I think we can proceed with surgery safely.  We briefly rediscussed Roux-en-Y gastric bypass. We rediscussed the risk and benefits of surgery including but not limited to bleeding, infection, leak, blood clot formation, hernia, anesthesia complications, et Ronney Asters. She is encouraged to continue with her preoperative diet. We discussed the importance of adherence to the preoperative diet.  She was given her bowel prep as well as her prescription for her postoperative pain medication.  Leighton Ruff. Redmond Pulling, MD, FACS General, Bariatric, & Minimally Invasive Surgery [facility], [place]         [facility] M 11/25/2013, 2:03 PM

## 2013-12-06 NOTE — Interval H&P Note (Signed)
History and Physical Interval Note:  12/06/2013 10:47 AM  Theresa [name]  has presented today for surgery, with the diagnosis of morbid obesity   The various methods of treatment have been discussed with the patient and family. After consideration of risks, benefits and other options for treatment, the patient has consented to  Procedure(s): LAPAROSCOPIC ROUX-EN-Y GASTRIC BYPASS WITH UPPER ENDOSCOPY (N/A) as a surgical intervention .  The patient's history has been reviewed, patient examined, no change in status, stable for surgery.  I have reviewed the patient's chart and labs.  Questions were answered to the patient's satisfaction.    Leighton Ruff. Redmond Pulling, MD, [place], Bariatric, & Minimally Invasive Surgery [facility], [place]   [facility] M

## 2013-12-06 NOTE — Anesthesia Preprocedure Evaluation (Addendum)
Anesthesia Evaluation  Patient identified by MRN, date of birth, ID band Patient awake    Reviewed: Allergy & Precautions, H&P , NPO status , Patient's Chart, lab work & pertinent test results, reviewed documented beta blocker date and time   Airway Mallampati: II TM Distance: >3 FB Neck ROM: Full    Dental  (+) Dental Advisory Given   Pulmonary sleep apnea ,  breath sounds clear to auscultation        Cardiovascular hypertension, Pt. on medications and Pt. on home beta blockers negative cardio ROS  Rhythm:Regular Rate:Normal     Neuro/Psych PSYCHIATRIC DISORDERS negative neurological ROS     GI/Hepatic negative GI ROS, Neg liver ROS, GERD-  ,  Endo/Other  Morbid obesity  Renal/GU negative Renal ROS     Musculoskeletal negative musculoskeletal ROS (+)   Abdominal (+) + obese,   Peds  Hematology negative hematology ROS (+)   Anesthesia Other Findings   Reproductive/Obstetrics negative OB ROS                         Anesthesia Physical Anesthesia Plan  ASA: III  Anesthesia Plan: General   Post-op Pain Management:    Induction: Intravenous  Airway Management Planned: Oral ETT  Additional Equipment:   Intra-op Plan:   Post-operative Plan: Extubation in OR  Informed Consent: I have reviewed the patients History and Physical, chart, labs and discussed the procedure including the risks, benefits and alternatives for the proposed anesthesia with the patient or authorized representative who has indicated his/her understanding and acceptance.   Dental advisory given  Plan Discussed with: CRNA  Anesthesia Plan Comments:         Anesthesia Quick Evaluation

## 2013-12-07 ENCOUNTER — Inpatient Hospital Stay (HOSPITAL_COMMUNITY): Payer: BC Managed Care – PPO

## 2013-12-07 ENCOUNTER — Encounter (HOSPITAL_COMMUNITY): Payer: Self-pay | Admitting: General Surgery

## 2013-12-07 ENCOUNTER — Encounter: Payer: Self-pay | Admitting: Endocrinology

## 2013-12-07 LAB — CBC WITH DIFFERENTIAL/PLATELET
BASOS ABS: 0 10*3/uL (ref 0.0–0.1)
BASOS PCT: 0 % (ref 0–1)
Eosinophils Absolute: 0 10*3/uL (ref 0.0–0.7)
Eosinophils Relative: 0 % (ref 0–5)
HCT: 37.4 % (ref 36.0–46.0)
HEMOGLOBIN: 12.4 g/dL (ref 12.0–15.0)
Lymphocytes Relative: 11 % — ABNORMAL LOW (ref 12–46)
Lymphs Abs: 1.2 10*3/uL (ref 0.7–4.0)
MCH: 28.3 pg (ref 26.0–34.0)
MCHC: 33.2 g/dL (ref 30.0–36.0)
MCV: 85.4 fL (ref 78.0–100.0)
MONOS PCT: 6 % (ref 3–12)
Monocytes Absolute: 0.7 10*3/uL (ref 0.1–1.0)
NEUTROS ABS: 8.7 10*3/uL — AB (ref 1.7–7.7)
NEUTROS PCT: 83 % — AB (ref 43–77)
Platelets: 283 10*3/uL (ref 150–400)
RBC: 4.38 MIL/uL (ref 3.87–5.11)
RDW: 13.5 % (ref 11.5–15.5)
WBC: 10.6 10*3/uL — AB (ref 4.0–10.5)

## 2013-12-07 LAB — COMPREHENSIVE METABOLIC PANEL
ALBUMIN: 4 g/dL (ref 3.5–5.2)
ALK PHOS: 73 U/L (ref 39–117)
ALT: 53 U/L — ABNORMAL HIGH (ref 0–35)
AST: 44 U/L — ABNORMAL HIGH (ref 0–37)
BILIRUBIN TOTAL: 0.5 mg/dL (ref 0.3–1.2)
BUN: 8 mg/dL (ref 6–23)
CHLORIDE: 103 meq/L (ref 96–112)
CO2: 24 mEq/L (ref 19–32)
Calcium: 9 mg/dL (ref 8.4–10.5)
Creatinine, Ser: 0.98 mg/dL (ref 0.50–1.10)
GFR calc Af Amer: 83 mL/min — ABNORMAL LOW (ref >90)
GFR calc non Af Amer: 71 mL/min — ABNORMAL LOW (ref >90)
Glucose, Bld: 127 mg/dL — ABNORMAL HIGH (ref 70–99)
POTASSIUM: 4.4 meq/L (ref 3.7–5.3)
Sodium: 138 mEq/L (ref 137–147)
Total Protein: 7.1 g/dL (ref 6.0–8.3)

## 2013-12-07 LAB — HEMOGLOBIN AND HEMATOCRIT, BLOOD
HCT: 34.6 % — ABNORMAL LOW (ref 36.0–46.0)
Hemoglobin: 11.3 g/dL — ABNORMAL LOW (ref 12.0–15.0)

## 2013-12-07 IMAGING — RF DG UGI W/ GASTROGRAFIN
14 of 24 series · 14 of 24 positions shown · IV contrast (omnipaque)
Comparison: DG UGI W/KUB dated [DATE]

FLUOROSCOPY TIME:  2 min, 28 seconds

CLINICAL DATA: Postoperative for roux-en-y gastrojejunostomy

EXAM:
WATER SOLUBLE UPPER GI SERIES
TECHNIQUE: Single-column upper GI series was performed using water soluble
contrast.
CONTRAST:  50mL OMNIPAQUE IOHEXOL 300 MG/ML  SOLN

[Series 1: run · 1 of 1 slices shown (1 of 14)]
[im 1/1]
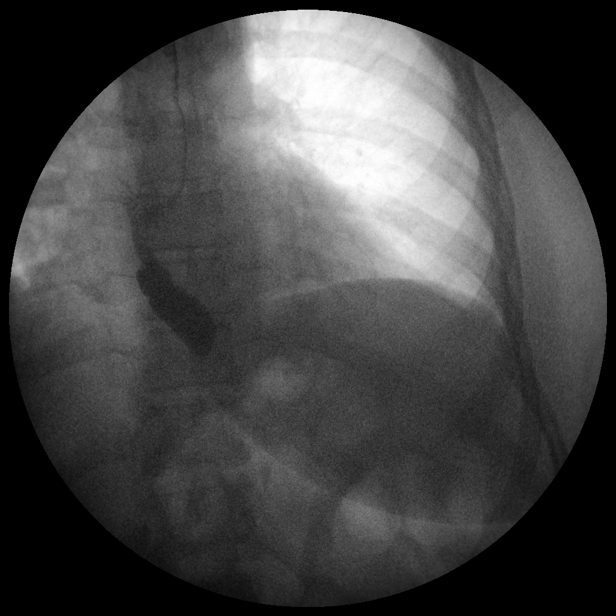

[Series 3: run · 1 of 1 slices shown (2 of 14)]
[im 1/1]
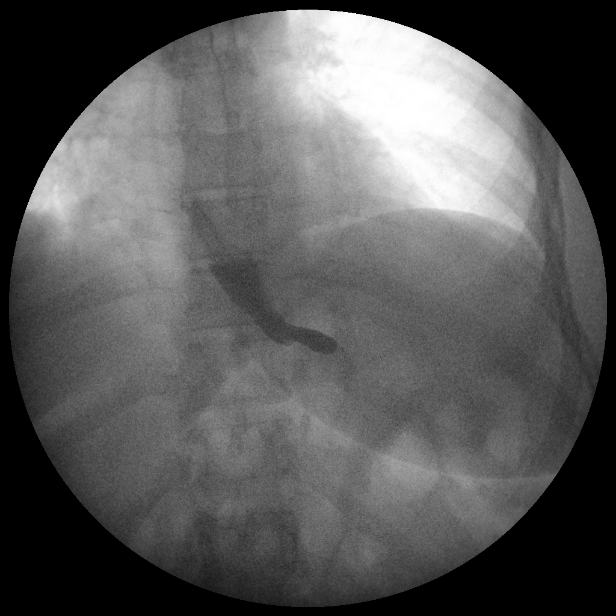

[Series 5: run · 1 of 1 slices shown (3 of 14)]
[im 1/1]
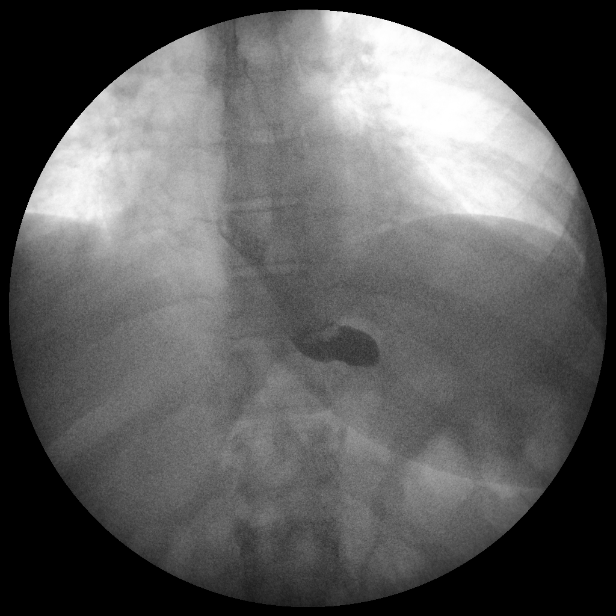

[Series 7: run · 1 of 1 slices shown (4 of 14)]
[im 1/1]
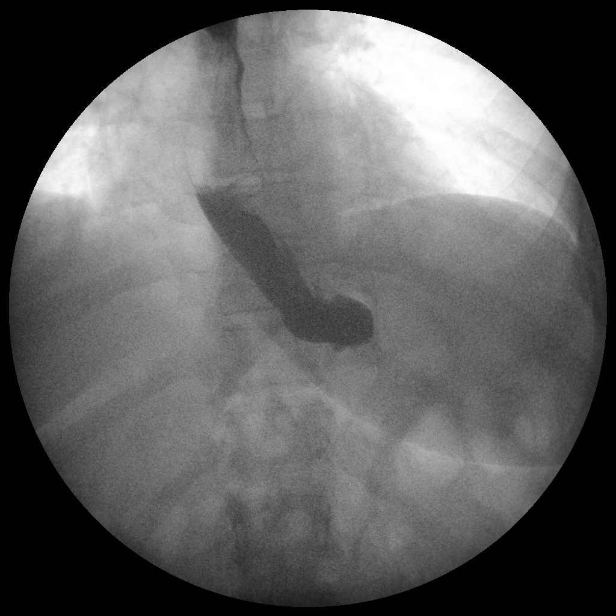

[Series 8: run · 1 of 1 slices shown (5 of 14)]
[im 1/1]
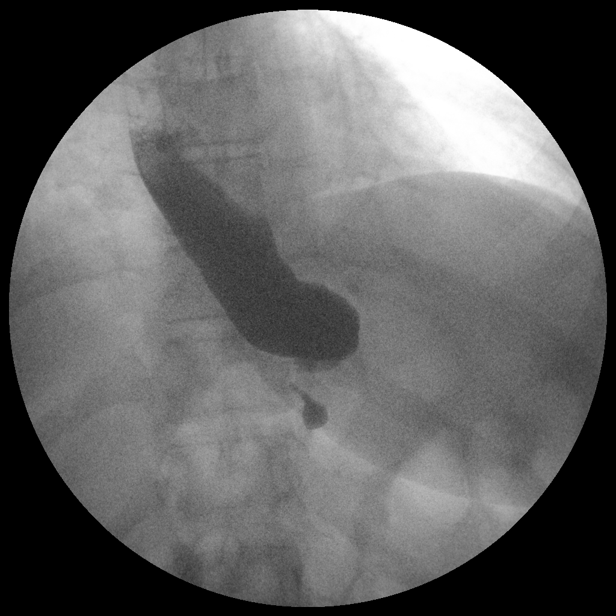

[Series 10: run · 1 of 1 slices shown (6 of 14)]
[im 1/1]
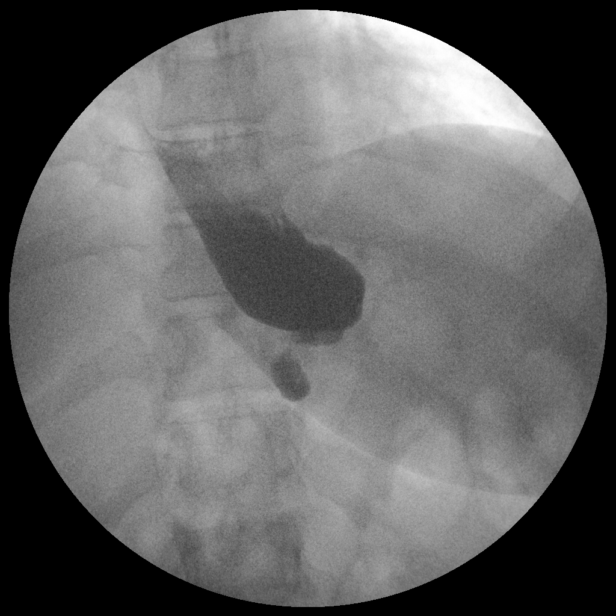

[Series 12: run · 1 of 1 slices shown (7 of 14)]
[im 1/1]
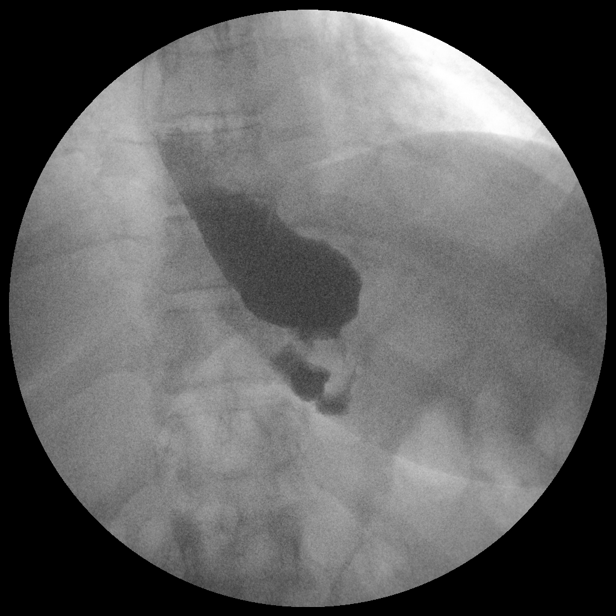

[Series 13: run · 1 of 1 slices shown (8 of 14)]
[im 1/1]
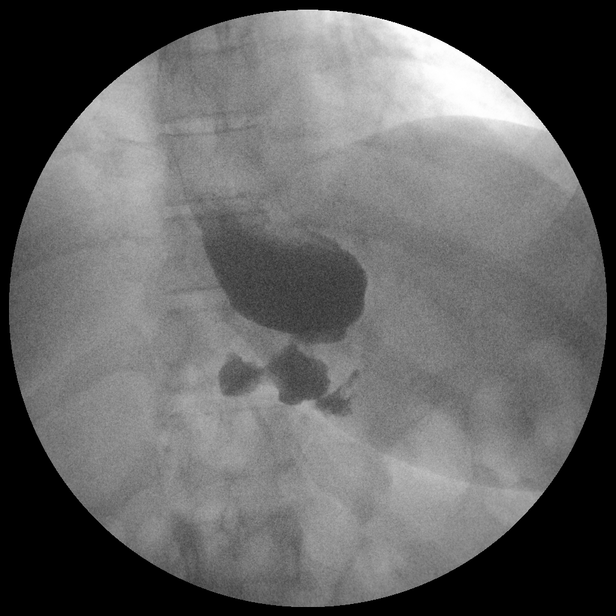

[Series 15: run · 1 of 1 slices shown (9 of 14)]
[im 1/1]
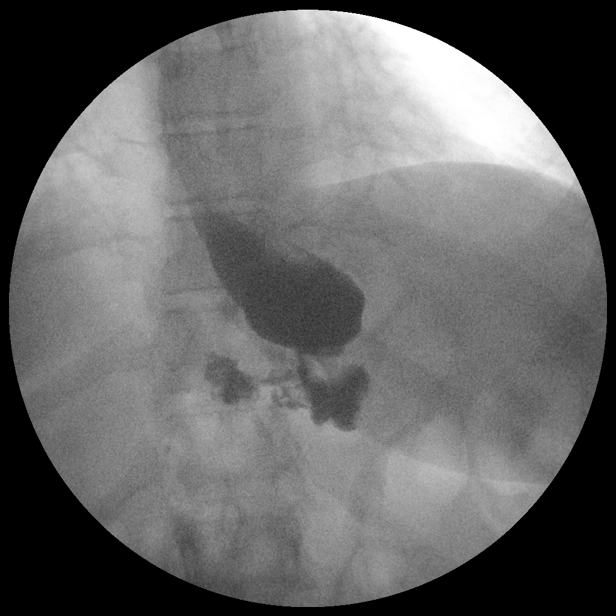

[Series 17: run · 1 of 1 slices shown (10 of 14)]
[im 1/1]
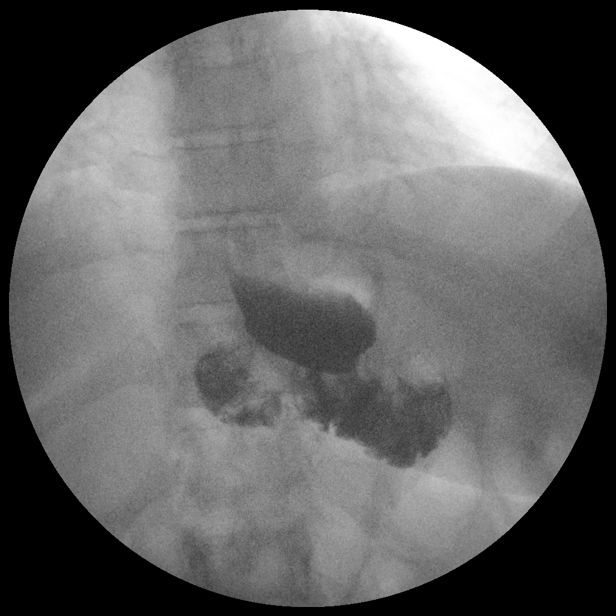

[Series 19: run · 1 of 1 slices shown (11 of 14)]
[im 1/1]
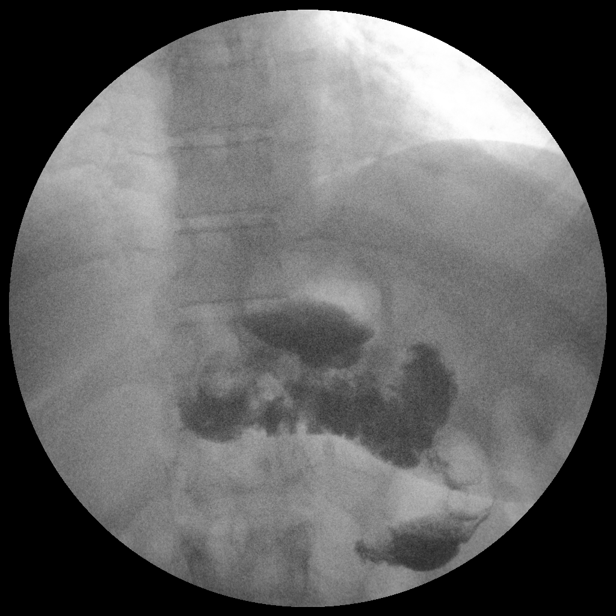

[Series 20: run · 1 of 1 slices shown (12 of 14)]
[im 1/1]
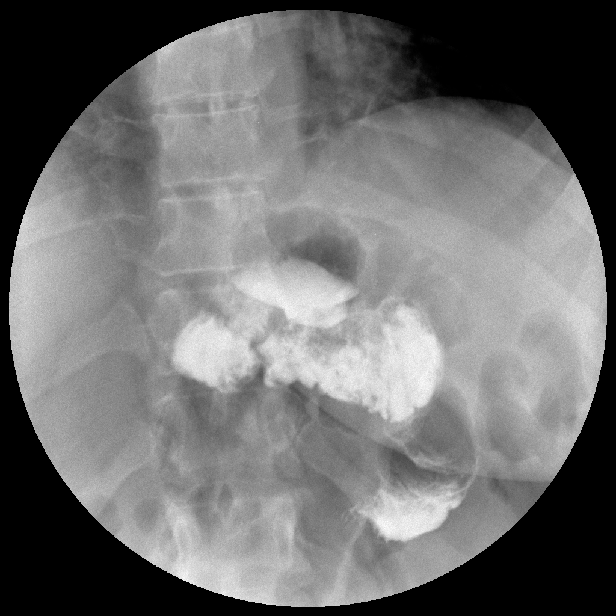

[Series 22: run · 1 of 1 slices shown (13 of 14)]
[im 1/1]
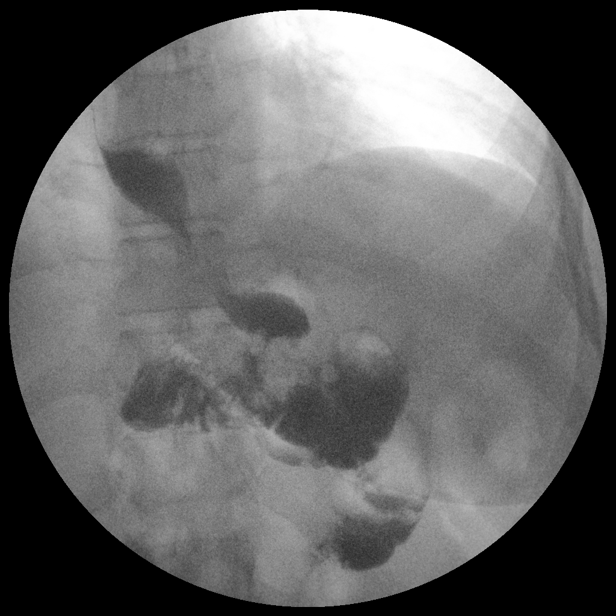

[Series 24: run · 1 of 1 slices shown (14 of 14)]
[im 1/1]
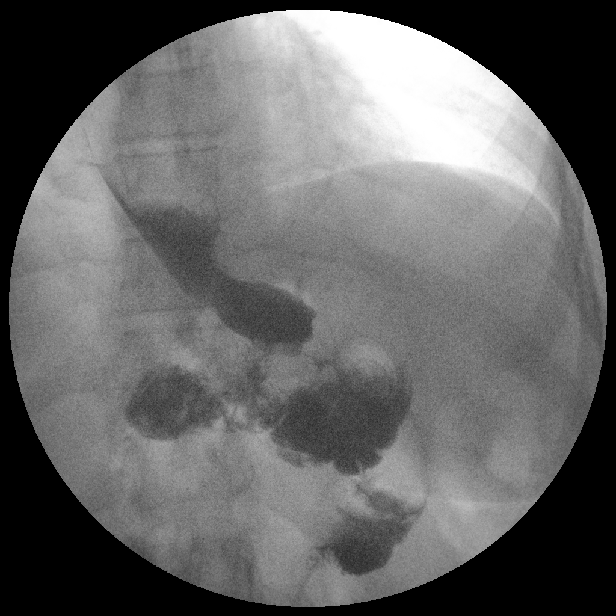

[14 of 24 positions shown; findings below may reference images not displayed]

FINDINGS: Initial KUB demonstrates several mildly dilated loops of small bowel
in the left upper quadrant

Initially in the partially upright position, two 25 cc oral boluses
of Omnipaque were administered to the patient.

Small gastric pouch with patent gastrojejunostomy observed. No leak
is identified. Contrast filled several dilated loops of left upper
quadrant small bowel. It was slow to progress be on these loops. The
bowel appeared hypoactive.

Iced did the patient out completely, causing air-fluid levels along
the contrast to form. Small bowel loops in the vicinity measure up
to 3.7 cm.

Intermittent fluoroscopic observation over 7 min demonstrated
sluggish progression of contrast, without transition to nondilated
loops.
IMPRESSION: 1. No leak identified at the gastrojejunostomy. There is a dilated
collection of jejunum in the left upper quadrant, up to 3.7 cm, with
sluggish progression of contrast. I favor ileus over obstruction,
but I do recommend correlation with bowel sounds, careful management
of dietary progression, and consideration of serial conventional
radiographs of the abdomen to ensure expected progression of
contrast medium.

## 2013-12-07 MED ORDER — LAMOTRIGINE 150 MG PO TABS
150.0000 mg | ORAL_TABLET | Freq: Every day | ORAL | Status: DC
  Administered 2013-12-07 – 2013-12-09 (×3): 150 mg via ORAL
  Filled 2013-12-07 (×3): qty 1

## 2013-12-07 MED ORDER — DIPHENHYDRAMINE HCL 12.5 MG/5ML PO ELIX
25.0000 mg | ORAL_SOLUTION | Freq: Four times a day (QID) | ORAL | Status: DC | PRN
  Administered 2013-12-07 – 2013-12-08 (×4): 25 mg via ORAL
  Filled 2013-12-07 (×4): qty 10

## 2013-12-07 MED ORDER — CHLORHEXIDINE GLUCONATE 0.12 % MT SOLN
15.0000 mL | Freq: Two times a day (BID) | OROMUCOSAL | Status: DC
  Administered 2013-12-07 – 2013-12-09 (×4): 15 mL via OROMUCOSAL
  Filled 2013-12-07 (×7): qty 15

## 2013-12-07 MED ORDER — NEBIVOLOL HCL 10 MG PO TABS
10.0000 mg | ORAL_TABLET | Freq: Every morning | ORAL | Status: DC
  Administered 2013-12-08 – 2013-12-09 (×2): 10 mg via ORAL
  Filled 2013-12-07 (×3): qty 1

## 2013-12-07 MED ORDER — SIMETHICONE 40 MG/0.6ML PO SUSP
40.0000 mg | Freq: Four times a day (QID) | ORAL | Status: DC | PRN
  Administered 2013-12-07 – 2013-12-08 (×4): 40 mg via ORAL
  Filled 2013-12-07 (×4): qty 0.6

## 2013-12-07 MED ORDER — IOHEXOL 300 MG/ML  SOLN
50.0000 mL | Freq: Once | INTRAMUSCULAR | Status: AC | PRN
  Administered 2013-12-07: 50 mL via ORAL

## 2013-12-07 MED ORDER — BIOTENE DRY MOUTH MT LIQD
15.0000 mL | Freq: Two times a day (BID) | OROMUCOSAL | Status: DC
  Administered 2013-12-08 (×2): 15 mL via OROMUCOSAL

## 2013-12-07 NOTE — Progress Notes (Signed)
Patient is alert and oriented, vital signs are stable, husband at bedside and supportive, patient began her post op day 1 diet and has tolerated it fairly some nausea but no active vomiting, patient has ambulated several times around the nursing station, simethacone for gas and effective patient is now a lot more comfortable, complaints of itching when she had oxycodone and benadryl ordered and given to patient, will continue to monitor Neta Mends RN 12-07-2013 17:50

## 2013-12-07 NOTE — Progress Notes (Signed)
Patient with complaints of itching from oxycodone, MD Lucia Gaskins notified and benadryl 25 mg elixar order every 6 hours for itching, order entered Neta Mends RN 12-07-2013 17:26pm

## 2013-12-07 NOTE — Progress Notes (Signed)
1 Day Post-Op  Subjective: Has some reflux/heartburn. Had some urinary retention had to be I&o but did urinate spontaneously. Walked 6 times since surgery  Objective: Vital signs in last 24 hours: Temp:  [97.8 F (36.6 C)-98.8 F (37.1 C)] 98.4 F (36.9 C) (03/31 0800) Pulse Rate:  [55-93] 82 (03/31 0800) Resp:  [15-18] 18 (03/31 0800) BP: (108-162)/(51-92) 120/76 mmHg (03/31 0800) SpO2:  [96 %-100 %] 96 % (03/31 0800) Weight:  [249 lb 12.8 oz (113.309 kg)] 249 lb 12.8 oz (113.309 kg) (03/30 1624)    Intake/Output from previous day: 03/30 0701 - 03/31 0700 In: 2422.9 [I.V.:2372.9; IV Piggyback:50] Out: 72 [Urine:800; Blood:20] Intake/Output this shift: Total I/O In: 500 [I.V.:500] Out: -   Alert, nad cta Reg  Soft, obese, incisions c/d/i. Expected mild TTP No edema  Lab Results:   Recent Labs  12/06/13 1506 12/07/13 0430  WBC  --  10.6*  HGB 11.8* 12.4  HCT 35.8* 37.4  PLT  --  283   BMET  Recent Labs  12/07/13 0430  NA 138  K 4.4  CL 103  CO2 24  GLUCOSE 127*  BUN 8  CREATININE 0.98  CALCIUM 9.0   PT/INR No results found for this basename: LABPROT, INR,  in the last 72 hours ABG No results found for this basename: PHART, PCO2, PO2, HCO3,  in the last 72 hours  Studies/Results: No results found.  Anti-infectives: Anti-infectives   Start     Dose/Rate Route Frequency Ordered Stop   12/06/13 0846  cefOXitin (MEFOXIN) 2 g in dextrose 5 % 50 mL IVPB     2 g 100 mL/hr over 30 Minutes Intravenous On call to O.R. 12/06/13 0846 12/06/13 1341      Assessment/Plan: s/p Procedure(s): LAPAROSCOPIC ROUX-EN-Y GASTRIC BYPASS WITH UPPER ENDOSCOPY (N/A)  No fever. No clinical sign of leak. For UGI this am. If ok will start po  Simethicone for gas once clear for PO OOB CPAP for OSA Will restart some home meds once po is ok.   LOS: 1 day    Gayland Curry 12/07/2013

## 2013-12-07 NOTE — Care Management Note (Signed)
   CARE MANAGEMENT NOTE 12/07/2013  Patient:  Theresa [name], Theresa [name]   Account Number:  [id number]  Date Initiated:  12/07/2013  Documentation initiated by:  Anneke Cundy  Subjective/Objective Assessment:   43 yo female admitted s/p s/p Procedure(s):  LAPAROSCOPIC ROUX-EN-Y GASTRIC BYPASS WITH UPPER ENDOSCOPY     Action/Plan:   Home when stable   Anticipated DC Date:     Anticipated DC Plan:  [place]  CM consult      Choice offered to / List presented to:  NA   DME arranged  NA      DME agency  NA     [place] arranged  NA      [place] agency  NA   Status of service:  Completed, signed off Medicare Important Message given?   (If response is "NO", the following Medicare IM given date fields will be blank) Date Medicare IM given:   Date Additional Medicare IM given:    Discharge Disposition:    Per UR Regulation:  Reviewed for med. necessity/level of care/duration of stay  If discussed at [place] of Stay Meetings, dates discussed:    Comments:  12/07/13 [place] Chart reviewed for utilization of services. No needs identified at this time.

## 2013-12-08 LAB — CBC WITH DIFFERENTIAL/PLATELET
BASOS PCT: 0 % (ref 0–1)
Basophils Absolute: 0 10*3/uL (ref 0.0–0.1)
EOS ABS: 0 10*3/uL (ref 0.0–0.7)
Eosinophils Relative: 0 % (ref 0–5)
HEMATOCRIT: 35.1 % — AB (ref 36.0–46.0)
Hemoglobin: 11.1 g/dL — ABNORMAL LOW (ref 12.0–15.0)
Lymphocytes Relative: 26 % (ref 12–46)
Lymphs Abs: 2.1 10*3/uL (ref 0.7–4.0)
MCH: 27.4 pg (ref 26.0–34.0)
MCHC: 31.6 g/dL (ref 30.0–36.0)
MCV: 86.7 fL (ref 78.0–100.0)
MONOS PCT: 7 % (ref 3–12)
Monocytes Absolute: 0.6 10*3/uL (ref 0.1–1.0)
Neutro Abs: 5.6 10*3/uL (ref 1.7–7.7)
Neutrophils Relative %: 67 % (ref 43–77)
Platelets: 232 10*3/uL (ref 150–400)
RBC: 4.05 MIL/uL (ref 3.87–5.11)
RDW: 13.8 % (ref 11.5–15.5)
WBC: 8.3 10*3/uL (ref 4.0–10.5)

## 2013-12-08 MED ORDER — GI COCKTAIL ~~LOC~~
30.0000 mL | Freq: Three times a day (TID) | ORAL | Status: DC | PRN
  Filled 2013-12-08 (×2): qty 30

## 2013-12-08 MED ORDER — MAGNESIUM HYDROXIDE 400 MG/5ML PO SUSP
15.0000 mL | Freq: Once | ORAL | Status: AC
  Administered 2013-12-08: 15 mL via ORAL
  Filled 2013-12-08: qty 30

## 2013-12-08 NOTE — Progress Notes (Addendum)
2 Days Post-Op  Subjective: Tolerated water; however, has lots of gas pain in abd. No n/v. No burping or flatus. Can't get rid of gas pain. Walking numerous times. When got oxycodone got itching but benadryl stopped it  Objective: Vital signs in last 24 hours: Temp:  [98 F (36.7 C)-98.8 F (37.1 C)] 98.2 F (36.8 C) (04/01 0536) Pulse Rate:  [80-91] 87 (04/01 0536) Resp:  [16-18] 18 (04/01 0536) BP: (112-148)/(70-89) 135/89 mmHg (04/01 0536) SpO2:  [92 %-98 %] 98 % (04/01 0536)    Intake/Output from previous day: 03/31 0701 - 04/01 0700 In: 3060 [P.O.:60; I.V.:3000] Out: 2000 [Urine:2000] Intake/Output this shift: Total I/O In: 1560 [P.O.:60; I.V.:1500] Out: 1450 [Urine:1450]  Alert, sitting in rocking chair, nontoxic but appears a little uncomfortable cta b/l Reg Obese, soft, incision c/d/i. +BS No edema  Lab Results:   Recent Labs  12/07/13 0430 12/07/13 1600 12/08/13 0425  WBC 10.6*  --  8.3  HGB 12.4 11.3* 11.1*  HCT 37.4 34.6* 35.1*  PLT 283  --  232   BMET  Recent Labs  12/07/13 0430  NA 138  K 4.4  CL 103  CO2 24  GLUCOSE 127*  BUN 8  CREATININE 0.98  CALCIUM 9.0   PT/INR No results found for this basename: LABPROT, INR,  in the last 72 hours ABG No results found for this basename: PHART, PCO2, PO2, HCO3,  in the last 72 hours  Studies/Results: Dg Ugi W/water Sol Cm  12/07/2013   CLINICAL DATA:  Postoperative for roux-en-y gastrojejunostomy  EXAM: WATER SOLUBLE UPPER GI SERIES  TECHNIQUE: Single-column upper GI series was performed using water soluble contrast.  CONTRAST:  62m OMNIPAQUE IOHEXOL 300 MG/ML  SOLN  COMPARISON:  DG UGI W/KUB dated 10/18/2013  FLUOROSCOPY TIME:  2 min, 28 seconds  FINDINGS: Initial KUB demonstrates several mildly dilated loops of small bowel in the left upper quadrant  Initially in the partially upright position, two 25 cc oral boluses of Omnipaque were administered to the patient.  Small gastric pouch with patent  gastrojejunostomy observed. No leak is identified. Contrast filled several dilated loops of left upper quadrant small bowel. It was slow to progress be on these loops. The bowel appeared hypoactive.  Iced did the patient out completely, causing air-fluid levels along the contrast to form. Small bowel loops in the vicinity measure up to 3.7 cm.  Intermittent fluoroscopic observation over 7 min demonstrated sluggish progression of contrast, without transition to nondilated loops.  IMPRESSION: 1. No leak identified at the gastrojejunostomy. There is a dilated collection of jejunum in the left upper quadrant, up to 3.7 cm, with sluggish progression of contrast. I favor ileus over obstruction, but I do recommend correlation with bowel sounds, careful management of dietary progression, and consideration of serial conventional radiographs of the abdomen to ensure expected progression of contrast medium.   Electronically Signed   By: WSherryl BartersM.D.   On: 12/07/2013 10:34    Anti-infectives: Anti-infectives   Start     Dose/Rate Route Frequency Ordered Stop   12/06/13 0846  cefOXitin (MEFOXIN) 2 g in dextrose 5 % 50 mL IVPB     2 g 100 mL/hr over 30 Minutes Intravenous On call to O.R. 12/06/13 0846 12/06/13 1341      Assessment/Plan: s/p Procedure(s): LAPAROSCOPIC ROUX-EN-Y GASTRIC BYPASS WITH UPPER ENDOSCOPY (N/A)  Cont VTE prophylaxis UGI showed some dilated jejunum - perhaps source of gas pain; no fever, wbc ok - low suspicion for intra-abd  process Will try milk of magnesia, cont simethicone.  Hold on water/ice chips for now until feels more comfortable. May not be able to go home today until passes gas and tolerates shakes  Leighton Ruff. Redmond Pulling, MD, FACS General, Bariatric, & Minimally Invasive Surgery [facility], [place]   LOS: 2 days    Gayland Curry 12/08/2013

## 2013-12-08 NOTE — Progress Notes (Signed)
Nutrition Education Note  Patient identified via DROP protocol initiative.   Diet progression discussed with only information on 2 week post-op diet provided and reviewed. Further diet advancement to be discussed at 2 week post-op follow up visit with outpatient RD that pt has scheduled. Pt with approved protein shake and multivitamins/minerals. Importance of hydration reviewed with emphasis on liquids being non-carbonated, non-caffeinated, and sugar-free. Pt expressed understanding, expect good compliance.   Body mass index is 42.86 kg/(m^2). Patient meets criteria for morbid obesity based on current BMI.   Diet: First 2 Weeks  You will see the nutritionist about two (2) weeks after your surgery. The nutritionist will increase the types of foods you can eat if you are handling liquids well:  If you have severe vomiting or nausea and cannot handle clear liquids lasting longer than 1 day, call your surgeon  Protein Shake  Drink at least 2 ounces of shake 5-6 times per day  Each serving of protein shakes (usually 8 - 12 ounces) should have a minimum of:  15 grams of protein  And no more than 5 grams of carbohydrate  Goal for protein each day:  Men = 80 grams per day  Women = 60 grams per day  Protein powder may be added to fluids such as non-fat milk or Lactaid milk or Soy milk (limit to 35 grams added protein powder per serving)   Hydration  Slowly increase the amount of water and other clear liquids as tolerated (See Acceptable Fluids)  Slowly increase the amount of protein shake as tolerated  Sip fluids slowly and throughout the day  May use sugar substitutes in small amounts (no more than 6 - 8 packets per day; i.e. Splenda)   Fluid Goal  The first goal is to drink at least 8 ounces of protein shake/drink per day (or as directed by the nutritionist); some examples of protein shakes are Johnson [employer] Johnson, [employer]R Corporation, EAS Edge HP, and Unjury. See handout from pre-op Bariatric Education  Class:  Slowly increase the amount of protein shake you drink as tolerated  You may find it easier to slowly sip shakes throughout the day  It is important to get your proteins in first  Your fluid goal is to drink 64 - 100 ounces of fluid daily  It may take a few weeks to build up to this  32 oz (or more) should be clear liquids  And  32 oz (or more) should be full liquids (see below for examples)  Liquids should not contain sugar, caffeine, or carbonation   Clear Liquids:  Water or Sugar-free flavored water (i.e. Fruit H2O, Propel)  Decaffeinated coffee or tea (sugar-free)  Crystal Lite, Wyler's Lite, Minute Maid Lite  Sugar-free Jell-O  Bouillon or broth  Sugar-free Popsicle: *Less than 20 calories each; Limit 1 per day   Full Liquids:  Protein Shakes/Drinks + 2 choices per day of other full liquids  Full liquids must be:  No More Than 12 grams of Carbs per serving  No More Than 3 grams of Fat per serving  Strained low-fat cream soup  Non-Fat milk  Fat-free Lactaid Milk  Sugar-free yogurt (D[place] Lite & Fit, [place] yogurt)          [place] MS, [place], [place] Pager [phone] After Hours Pager

## 2013-12-08 NOTE — Discharge Instructions (Addendum)
GASTRIC BYPASS/SLEEVE  Home Care Instructions   These instructions are to help you care for yourself when you go home.  Call: If you have any problems.   Call [phone] and ask for the surgeon on call   If you need immediate assistance come to the ER at [facility]. Tell the ER staff you are a new post-op gastric bypass or gastric sleeve patient  Signs and symptoms to report:   Severe  vomiting or nausea o If you cannot handle clear liquids for longer than 1 day, call your surgeon   Abdominal pain which does not get better after taking your pain medication   Fever greater than 100.4  F and chills   Heart rate over 100 beats a minute   Trouble breathing   Chest pain   Redness,  swelling, drainage, or foul odor at incision (surgical) sites   If your incisions open or pull apart   Swelling or pain in calf (lower leg)   Diarrhea (Loose bowel movements that happen often), frequent watery, uncontrolled bowel movements   Constipation, (no bowel movements for 3 days) if this happens: o Take Milk of Magnesia, 2 tablespoons by mouth, 3 times a day for 2 days if needed o Stop taking Milk of Magnesia once you have had a bowel movement o Call your doctor if constipation continues Or o Take Miralax  (instead of Milk of Magnesia) following the label instructions o Stop taking Miralax once you have had a bowel movement o Call your doctor if constipation continues o For Gas pain - can take simethicone   Anything you think is abnormal for you   Normal side effects after surgery:   Unable to sleep at night or unable to concentrate   Irritability   Being tearful (crying) or depressed  These are common complaints, possibly related to your anesthesia, stress of surgery, and change in lifestyle, that usually go away a few weeks after surgery. If these feelings continue, call your medical doctor.  Wound Care: You may have surgical glue, steri-strips, or staples over your incisions after  surgery   Surgical glue: Looks like clear film over your incisions and will wear off a little at a time   Steri-strips: Adhesive strips of tape over your incisions. You may notice a yellowish color on skin under the steri-strips. This is used to make the steri-strips stick better. Do not pull the steri-strips off - let them fall off   Staples: Staples may be removed before you leave the hospital o If you go home with staples, call [facility] for an appointment with your surgeons nurse to have staples removed 10 days after surgery, [phone]   Showering: You may shower two (2) days after your surgery unless your surgeon tells you differently o Wash gently around incisions with warm soapy water, rinse well, and gently pat dry o If you have a drain (tube from your incision), you may need someone to hold this while you shower o No tub baths until staples are removed and incisions are healed   Medications:   Medications should be liquid or crushed if larger than the size of a dime   Extended release pills (medication that releases a little bit at a time through the  day) should not be crushed   Depending on the size and number of medications you take, you may need to space (take a few throughout the day)/change the time you take your medications so  that you do not over-fill your pouch (smaller stomach)   Make sure you follow-up with you primary care physician to make medication changes needed during rapid weight loss and life -style changes   If you have diabetes, follow up with your doctor that orders your diabetes medication(s) within one week after surgery and check your blood sugar regularly    Do not drive while taking narcotics (pain medications)    Do not take acetaminophen (Tylenol) and Roxicet or Lortab Elixir at the same time since these pain medications contain acetaminophen   Diet:  First 2 Weeks You will see the nutritionist about two (2) weeks after your surgery. The  nutritionist will increase the types of foods you can eat if you are handling liquids well:   If you have severe vomiting or nausea and cannot handle clear liquids lasting longer than 1 day call your surgeon Protein Shake   Drink at least 2 ounces of shake 5-6 times per day   Each serving of protein shakes (usually 8-12 ounces) should have a minimum of: o 15 grams of protein o And no more than 5 grams of carbohydrate   Goal for protein each day: o Men = 80 grams per day o Women = 60 grams per day      Protein powder may be added to fluids such as non-fat milk or Lactaid milk or Soy milk (limit to 35 grams added protein powder per serving)  Hydration   Slowly increase the amount of water and other clear liquids as tolerated (See Acceptable Fluids)   Slowly increase the amount of protein shake as tolerated   Sip fluids slowly and throughout the day   May use sugar substitutes in small amounts (no more than 6-8 packets per day; i.e. Splenda)  Fluid Goal   The first goal is to drink at least 8 ounces of protein shake/drink per day (or as directed by the nutritionist); some examples of protein shakes are Johnson & Johnson, AMR Corporation, EAS Edge HP, and Unjury. - See handout from pre-op Bariatric Education Class: o Slowly increase the amount of protein shake you drink as tolerated o You may find it easier to slowly sip shakes throughout the day o It is important to get your proteins in first   Your fluid goal is to drink 64-100 ounces of fluid daily o It may take a few weeks to build up to this    32 oz. (or more) should be clear liquids And   32 oz. (or more) should be full liquids (see below for examples)   Liquids should not contain sugar, caffeine, or carbonation  Clear Liquids:   Water of Sugar-free flavored water (i.e. Fruit HO, Propel)   Decaffeinated coffee or tea (sugar-free)   Crystal lite, [place]yl[place] Lite, Minute Maid Lite   Sugar-free Jell-O   Bouillon or broth   Sugar-free  Popsicle:    - Less than 20 calories each; Limit 1 per day  Full Liquids:                   Protein Shakes/Drinks + 2 choices per day of other full liquids   Full liquids must be: o No More Than 12 grams of Carbs per serving o No More Than 3 grams of Fat per serving   Strained low-fat cream soup   Non-Fat milk   Fat-free Lactaid Milk   Sugar-free yogurt (Dannon Lite & Fit, Greek yogurt)    Vitamins and Minerals   Start  1 day after surgery unless otherwise directed by your surgeon   2 Chewable Multivitamin / Multimineral Supplement with iron (i.e. Centrum for Adults)   Vitamin B-12, 350-500 micrograms sub-lingual (place tablet under the tongue) each day   Chewable Calcium Citrate with Vitamin D-3 (Example: 3 Chewable Calcium  Plus 600 with Vitamin D-3) o Take 500 mg three (3) times a day for a total of 1500 mg each day o Do not take all 3 doses of calcium at one time as it may cause constipation, and you can only absorb 500 mg at a time o Do not mix multivitamins containing iron with calcium supplements;  take 2 hours apart o Do not substitute Tums (calcium carbonate) for your calcium   Menstruating women and those at risk for anemia ( a blood disease that causes weakness) may need extra iron o Talk to your doctor to see if you need more iron   If you need extra iron: Total daily Iron recommendation (including Vitamins) is 50 to 100 mg Iron/day   Do not stop taking or change any vitamins or minerals until you talk to your nutritionist or surgeon   Your nutritionist and/or surgeon must approve all vitamin and mineral supplements   Activity and Exercise: It is important to continue walking at home. Limit your physical activity as instructed by your doctor. During this time, use these guidelines:   Do not lift anything greater than ten  (10) pounds for at least two (2) weeks   Do not go back to work or drive until Engineer, production says you can   You may have sex when you feel comfortable o It  is VERY important for female patients to use a reliable birth control method; fertility often increase after surgery o Do not get pregnant for at least 18 months   Start exercising as soon as your doctor tells you that you can o Make sure your doctor approves any physical activity   Start with a simple walking program   Walk 5-15 minutes each day, 7 days per week   Slowly increase until you are walking 30-45 minutes per day   Consider joining our [place] program. [phone] or email [email or web address]   Special Instructions Things to remember:   Free counseling is available for you and your family through collaboration between [facility] and [place]. Please call [phone] and leave a message   Use your CPAP when sleeping if this applies to you   Consider buying a medical alert bracelet that says you had lap-band surgery     You will likely have your first fill (fluid added to your band) 6 - 8 weeks after surgery   [facility] has a free Bariatric Surgery Support Group that meets monthly, the 3rd Thursday, [place]. You can see classes online at [email or web address]   It is very important to keep all follow up appointments with your surgeon, nutritionist, primary care physician, and behavioral health practitioner o After the first year, please follow up with your bariatric surgeon and nutritionist at least once a year in order to maintain best weight loss results                    [facility]:  [place]: [phone]  Bariatric Nurse Coordinator: [phone]  Gastric Bypass/Sleeve Home Care Instructions  Rev. 10/2012                                                         Reviewed and Endorsed                                                    by [facility] Patient Education Committee, Jan, 2014

## 2013-12-08 NOTE — Progress Notes (Signed)
Patient is alert and oriented and vital signs are stable. Patient has not had a bowel movement or flatus but is belching. Patient complained of nausea at 1330 and was relieved by Zofran 4mg . Patient was advanced from 2oz of water to protein supplement (Unjury chicken soup) this afternoon and tolerated well with no complaints of vomiting or nausea. Patient ambulated several times in hall around nursing station and tolerated well. Marlinda Mike (Student Nurse) 12/08/2013. 17:22

## 2013-12-09 ENCOUNTER — Encounter: Payer: Self-pay | Admitting: Internal Medicine

## 2013-12-09 NOTE — Progress Notes (Signed)
Assessment unchanged. Pt verbalized understanding of dc instructions through teach back. Received bariatric teaching from Vilinda Flake, RN yesterday. No scripts at discharge. Pt has My Chart account. Discharged via wc to front entrance to meet awaiting vehicle to carry home. Accompanied by husband and NT.

## 2013-12-09 NOTE — Assessment & Plan Note (Signed)
Encouraging some effort at weight loss

## 2013-12-09 NOTE — Progress Notes (Signed)
Discharge summary sent to payer through [facility]  

## 2013-12-09 NOTE — Discharge Summary (Signed)
Physician Discharge Summary  Theresa [name] ZOX:[id number] DOB: [birth year] DOA: 12/06/2013  PCP: Redge Gainer, MD  Admit date: 12/06/2013 Discharge date: 12/09/2013  Recommendations for Outpatient Follow-up:  1. See below  Discharge Diagnoses:  Active Problems:   Hyperlipidemia   Bipolar 1 disorder, mixed, moderate   Unspecified essential hypertension   Obstructive sleep apnea   GERD (gastroesophageal reflux disease)   Obesity, Class III, BMI 40-49.9 (morbid obesity)   S/P gastric bypass   Surgical Procedure: Laparoscopic Roux-en-Y gastric bypass, upper endoscopy  Discharge Condition: Good Disposition: Home  Diet recommendation: Postoperative gastric bypass diet  Filed Weights   12/06/13 0847 12/06/13 1624  Weight: 249 lb 12.8 oz (113.309 kg) 249 lb 12.8 oz (113.309 kg)     Hospital Course:  The patient was admitted for a planned laparoscopic Roux-en-Y gastric bypass. Please see operative note. Preoperatively the patient was given 5000 units of subcutaneous heparin for DVT prophylaxis. Postoperative prophylactic Lovenox dosing was started on the morning of postoperative day 1. The patient underwent an upper GI on postoperative day 1 which demonstrated no extravasation of contrast and emptying of the contrast into the Roux limb. The patient was started on ice chips and water which they tolerated. On postoperative day 2 The patient's diet was initially not advanced because of gas pain but ultimately advanced to protein shakes which they also tolerated once she had flatus. The patient was ambulating without difficulty. Their vital signs are stable without fever or tachycardia. Their hemoglobin had remained stable. The patient was maintained on their home settings for CPAP therapy. The patient had received discharge instructions and counseling. They were deemed stable for discharge.  BP 150/92  Pulse 76  Temp(Src) 97.9 F (36.6 C) (Oral)  Resp 18  Ht 5' 4"  (1.626 m)  Wt 249 lb  12.8 oz (113.309 kg)  BMI 42.86 kg/m2  SpO2 98%  LMP 10/10/2013  Gen: alert, NAD, non-toxic appearing Pupils: equal, no scleral icterus Pulm: Lungs clear to auscultation, symmetric chest rise CV: regular rate and rhythm Abd: soft, nontender, nondistended. No cellulitis. No incisional hernia Ext: no edema, no calf tenderness Skin: no rash, no jaundice   Discharge Instructions      Discharge Orders   Future Appointments Provider Department Dept Phone   12/17/2013 9:30 AM Gayland Curry, MD [facility], [place]   12/21/2013 3:30 PM [facility] Post-Op [facility] [phone]   03/21/2014 4:00 PM Deneise Lever, MD [facility] [phone]   Future Orders Complete By Expires   Discharge instructions  As directed    Comments:     See bariatric discharge instructions   Increase activity slowly  As directed        Medication List    STOP taking these medications       meloxicam 15 MG tablet  Commonly known as:  MOBIC      TAKE these medications       acetaminophen 325 MG tablet  Commonly known as:  TYLENOL  Take 650 mg by mouth every 6 (six) hours as needed (Pain).     ALPRAZolam 1 MG tablet  Commonly known as:  XANAX  Take 1 mg by mouth 2 (two) times daily as needed for anxiety.     atorvastatin 40 MG tablet  Commonly known as:  LIPITOR  Take 1 tablet by mouth daily.     furosemide 40 MG tablet  Commonly known as:  LASIX  Take 40 mg  by mouth daily as needed for fluid.     lamoTRIgine 150 MG tablet  Commonly known as:  LAMICTAL  Take 1 tablet (150 mg total) by mouth daily.     nebivolol 10 MG tablet  Commonly known as:  BYSTOLIC  Take 10 mg by mouth every morning.     omeprazole 40 MG capsule  Commonly known as:  PRILOSEC  Take 40 mg by mouth daily.     oxyCODONE 5 MG/5ML solution  Commonly known as:  ROXICODONE  Take 5-10 mLs (5-10 mg total) by mouth every 4 (four) hours as  needed for severe pain.     solifenacin 10 MG tablet  Commonly known as:  VESICARE  Take 1 tablet (10 mg total) by mouth daily.          The results of significant diagnostics from this hospitalization (including imaging, microbiology, ancillary and laboratory) are listed below for reference.    Significant Diagnostic Studies: Dg Chest 2 View  11/29/2013   CLINICAL DATA:  Hypertension.  EXAM: CHEST  2 VIEW  COMPARISON:  August 24, 2005.  FINDINGS: The heart size and mediastinal contours are within normal limits. Both lungs are clear. The visualized skeletal structures are unremarkable.  IMPRESSION: No acute cardiopulmonary abnormality seen.   Electronically Signed   By: Sabino Dick M.D.   On: 11/29/2013 16:39   Dg Duanne Limerick W/water Sol Cm  12/07/2013   CLINICAL DATA:  Postoperative for roux-en-y gastrojejunostomy  EXAM: WATER SOLUBLE UPPER GI SERIES  TECHNIQUE: Single-column upper GI series was performed using water soluble contrast.  CONTRAST:  48m OMNIPAQUE IOHEXOL 300 MG/ML  SOLN  COMPARISON:  DG UGI W/KUB dated 10/18/2013  FLUOROSCOPY TIME:  2 min, 28 seconds  FINDINGS: Initial KUB demonstrates several mildly dilated loops of small bowel in the left upper quadrant  Initially in the partially upright position, two 25 cc oral boluses of Omnipaque were administered to the patient.  Small gastric pouch with patent gastrojejunostomy observed. No leak is identified. Contrast filled several dilated loops of left upper quadrant small bowel. It was slow to progress be on these loops. The bowel appeared hypoactive.  Iced did the patient out completely, causing air-fluid levels along the contrast to form. Small bowel loops in the vicinity measure up to 3.7 cm.  Intermittent fluoroscopic observation over 7 min demonstrated sluggish progression of contrast, without transition to nondilated loops.  IMPRESSION: 1. No leak identified at the gastrojejunostomy. There is a dilated collection of jejunum in the left  upper quadrant, up to 3.7 cm, with sluggish progression of contrast. I favor ileus over obstruction, but I do recommend correlation with bowel sounds, careful management of dietary progression, and consideration of serial conventional radiographs of the abdomen to ensure expected progression of contrast medium.   Electronically Signed   By: WSherryl BartersM.D.   On: 12/07/2013 10:34    Labs: Basic Metabolic Panel:  Recent Labs Lab 12/07/13 0430  NA 138  K 4.4  CL 103  CO2 24  GLUCOSE 127*  BUN 8  CREATININE 0.98  CALCIUM 9.0   Liver Function Tests:  Recent Labs Lab 12/07/13 0430  AST 44*  ALT 53*  ALKPHOS 73  BILITOT 0.5  PROT 7.1  ALBUMIN 4.0    CBC:  Recent Labs Lab 12/06/13 1506 12/07/13 0430 12/07/13 1600 12/08/13 0425  WBC  --  10.6*  --  8.3  NEUTROABS  --  8.7*  --  5.6  HGB 11.8* 12.4 11.3*  11.1*  HCT 35.8* 37.4 34.6* 35.1*  MCV  --  85.4  --  86.7  PLT  --  283  --  232    CBG: No results found for this basename: GLUCAP,  in the last 168 hours  Active Problems:   Hyperlipidemia   Bipolar 1 disorder, mixed, moderate   Unspecified essential hypertension   Obstructive sleep apnea   GERD (gastroesophageal reflux disease)   Obesity, Class III, BMI 40-49.9 (morbid obesity)   S/P gastric bypass   Time coordinating discharge: 15 minutes  Signed:  Gayland Curry, MD [facility], [place] [phone] 12/09/2013, 8:30 AM

## 2013-12-09 NOTE — Assessment & Plan Note (Signed)
She describes good compliance and control now with AutoPap that is sleeping much better. Plan-continue AutoPap for now

## 2013-12-13 ENCOUNTER — Telehealth (HOSPITAL_COMMUNITY): Payer: Self-pay

## 2013-12-13 NOTE — Telephone Encounter (Signed)
Made discharge phone call to patient per DROP protocol. Asking the following questions.    1. Do you have someone to care for you now that you are home?  yes 2. Are you having pain now that is not relieved by your pain medication?  no 3. Are you able to drink the recommended daily amount of fluids (48 ounces minimum/day) and protein (60-80 grams/day) as prescribed by the dietitian or nutritional counselor?  yes 4. Are you taking the vitamins and minerals as prescribed?  yes 5. Do you have the "on call" number to contact your surgeon if you have a problem or question?  yes 6. Are your incisions free of redness, swelling or drainage? (If steri strips, address that these can fall off, shower as tolerated)  yes 7. Have your bowels moved since your surgery? yes  If not, are you passing gas?   8. Are you up and walking 3-4 times per day?  yes 9. Do you have an appointment to see a dietitian or nutritional counselor in the next month?  yes  The following questions can be added at the center's discharge phone call script at the center's discretion.  The questions are also captured within [employer] project custom fields. 1. Do you have an appointment made to see your surgeon in the next month?  yes 2. Were you provided your discharge medications before your surgery or before you were discharged from the hospital and are you taking them without problem?  yes 3. Were you provided phone numbers to the clinic/surgeon's office?  yes 4. Did you watch the patient education video module in the (clinic, surgeon's office, etc.) before your surgery?  yes 5. Do you have a discharge checklist that was provided to you in the hospital to reference with instructions on how to take care of yourself after surgery?  yes 6. Did you see a dietitian or nutritional counselor while you were in the hospital?  yes

## 2013-12-17 ENCOUNTER — Encounter (INDEPENDENT_AMBULATORY_CARE_PROVIDER_SITE_OTHER): Payer: Self-pay | Admitting: General Surgery

## 2013-12-17 ENCOUNTER — Ambulatory Visit (INDEPENDENT_AMBULATORY_CARE_PROVIDER_SITE_OTHER): Payer: BC Managed Care – PPO | Admitting: General Surgery

## 2013-12-17 ENCOUNTER — Telehealth (INDEPENDENT_AMBULATORY_CARE_PROVIDER_SITE_OTHER): Payer: Self-pay | Admitting: General Surgery

## 2013-12-17 VITALS — BP 130/84 | HR 76 | Temp 99.0°F | Resp 18 | Ht 64.0 in | Wt 237.0 lb

## 2013-12-17 DIAGNOSIS — Z09 Encounter for follow-up examination after completed treatment for conditions other than malignant neoplasm: Secondary | ICD-10-CM

## 2013-12-17 NOTE — Patient Instructions (Signed)
Can continue to do light cardio activity No lifting anything greater than 20 pounds for another 2 weeks Over the next several weeks you should be able to get up to 64 oz water daily and 50 grams of protein daily

## 2013-12-17 NOTE — Progress Notes (Signed)
Subjective:     Patient ID: Theresa [name], female   DOB: Jan 12, 1971, 43 y.o.   MRN: [id number]  HPI 43 year old Caucasian female comes in for followup after going laparoscopic Roux-en-Y gastric bypass surgery on March 30. She was discharged from home on April 2. She states that she is doing great. She denies any fatigue or low energy. She denies any abdominal pain. She denies any fevers or chills. She denies any nausea, vomiting or heartburn. She takes her supplements when she remembers. She states that she is constantly thirsty. She states that since she is drinking a lot of water she isn't hungry when it's time to eat. She denies any diarrhea or constipation. She is eager to know when she can resume full activities and go back to work. She is using her CPAP.  Review of Systems     Objective:   Physical Exam BP 130/84  Pulse 76  Temp(Src) 99 F (37.2 C) (Oral)  Resp 18  Ht 5' 4"  (1.626 m)  Wt 237 lb (107.502 kg)  BMI 40.66 kg/m2  LMP 10/10/2013  Gen: alert, NAD, non-toxic appearing Pupils: equal, no scleral icterus Pulm: Lungs clear to auscultation, symmetric chest rise CV: regular rate and rhythm Abd: soft, nontender, nondistended. Well-healed trocar sites. No cellulitis. No incisional hernia Ext: no edema, no calf tenderness Skin: no rash, no jaundice     Assessment:     Status post laparoscopic Roux-en-Y gastric bypass Hypertension - Improved Obstructive sleep apnea on CPAP Hyperlipidemia Gastroesophageal reflux disease Abnormal A1c     Plan:     Her highest weight documented was 268 pounds. Her preoperative weight was 258.9 pounds. I congratulated her on her weight loss. We discussed the importance of taking her supplements as directed. We discussed using a pillbox or reminder on her smartphone. While normally I have to encourage postop patients to increase their activity level, I advised her that she should still be doing things in moderation. I explained that it is not  a typical to have difficulty getting and 64 ounces of liquid and 50 g of protein this quickly after surgery. She was instructed to follow up a nutritionist next week as directed. I told her she can return to work with light duty in 3 weeks but another 5 weeks for full duty. Followup with me in 4-1/2 weeks  Leighton Ruff. Redmond Pulling, MD, FACS General, Bariatric, & Minimally Invasive Surgery [facility], [place]

## 2013-12-17 NOTE — Telephone Encounter (Signed)
LMOM asking pt to return my call.  I would like to check up on her since she missed her appt this morning with Dr. Redmond Pulling as well as reschedule her appt with him.

## 2013-12-20 ENCOUNTER — Encounter (INDEPENDENT_AMBULATORY_CARE_PROVIDER_SITE_OTHER): Payer: Self-pay | Admitting: General Surgery

## 2013-12-21 ENCOUNTER — Encounter: Payer: BC Managed Care – PPO | Attending: General Surgery

## 2013-12-21 VITALS — Ht 64.0 in | Wt 236.5 lb

## 2013-12-21 DIAGNOSIS — Z713 Dietary counseling and surveillance: Secondary | ICD-10-CM | POA: Insufficient documentation

## 2013-12-21 DIAGNOSIS — Z6841 Body Mass Index (BMI) 40.0 and over, adult: Secondary | ICD-10-CM | POA: Insufficient documentation

## 2013-12-21 NOTE — Patient Instructions (Signed)
Patient to follow Phase 3A-Soft, High Protein Diet and follow-up at [facility] in 6 weeks for 2 months post-op nutrition visit for diet advancement. 

## 2013-12-21 NOTE — Progress Notes (Signed)
  Bariatric Class:  Appt start time: 1530 end time:  1630.  2 Week Post-Operative Nutrition Class  Patient was seen on 12/21/2013 for Post-[facility]ative [facility]   Surgery date: 12/06/2013 Surgery type: RYGB Start weight at [facility]: 264 lbs on 10/06/2013 Weight today: 236.5 lbs Weight change: 20.5 lbs   TANITA  BODY COMP RESULTS  11/25/2013 12/21/13   BMI (kg/m^2) 44.1 40.6   Fat Mass (lbs) 135 118.5   Fat Free Mass (lbs) 122 118.0   Total Body Water (lbs) 89.5 86.5   The following the learning objectives were met by the patient during this course:  Identifies Phase 3A (Soft, High Proteins) Dietary Goals and will begin from 2 weeks post-operatively to 2 months post-operatively  Identifies appropriate sources of fluids and proteins   States protein recommendations and appropriate sources post-operatively  Identifies the need for appropriate texture modifications, mastication, and bite sizes when consuming solids  Identifies appropriate multivitamin and calcium sources post-operatively  Describes the need for physical activity post-operatively and will follow MD recommendations  States when to call healthcare provider regarding medication questions or post-operative complications  Handouts given during class include:  Phase 3A: Soft, High Protein Diet Handout  Follow-Up Plan: Patient will follow-up at [facility] in 6 weeks for 8 week post-op nutrition visit for diet advancement per MD.

## 2013-12-26 ENCOUNTER — Encounter (INDEPENDENT_AMBULATORY_CARE_PROVIDER_SITE_OTHER): Payer: Self-pay | Admitting: General Surgery

## 2014-01-20 ENCOUNTER — Ambulatory Visit (INDEPENDENT_AMBULATORY_CARE_PROVIDER_SITE_OTHER): Payer: BC Managed Care – PPO | Admitting: General Surgery

## 2014-01-20 ENCOUNTER — Encounter (INDEPENDENT_AMBULATORY_CARE_PROVIDER_SITE_OTHER): Payer: Self-pay | Admitting: General Surgery

## 2014-01-20 VITALS — BP 127/86 | HR 80 | Temp 98.1°F | Resp 14 | Ht 64.0 in | Wt 221.0 lb

## 2014-01-20 DIAGNOSIS — Z9884 Bariatric surgery status: Secondary | ICD-10-CM

## 2014-01-20 NOTE — Patient Instructions (Signed)
Attend support group - can help with food issues Stick with softer proteins for now - shakes, yogurt, cheese, seafood, fish Avoid eating for 1 hr when you get up in the morning If symptoms worsen - call me Exercise daily

## 2014-01-20 NOTE — Progress Notes (Signed)
Subjective:     Patient ID: Theresa [name], female   DOB: [birth year], 43 y.o.   MRN: [id number]  HPI 42 year old Caucasian female comes in for followup after undergoing laparoscopic Roux-en-Y gastric bypass on March 30. I last saw her in the office on April 10. Her weight at that time was 237 pounds. She states overall she is doing well with a few exceptions. She reports no problems with soft proteins yogurt and seafood however when she tries to eat chicken, pork or beef-she will regurgitate. She states that she is taking a small bite, chewing thoroughly, and waiting 20-30 seconds between bites of food. If she tries to use something very dense like chicken he'll just sit in her upper abdomen and cause a burning sensation. She also reports intolerance to solids if she eats right after waking up. She states that she is taking her supplements. She denies any fevers or chills. She denies any diarrhea. She does have some ongoing constipation. She is taking a stool softener. She is having a bowel movement every 2-3 days. She reports that she is almost at 64 ounces of liquid a day. She denies any left shoulder pain. She denies any melena or hematochezia. She states that she is very active and walks daily.she is no longer taking reflux medications  Review of Systems     Objective:   Physical Exam BP 127/86  Pulse 80  Temp(Src) 98.1 F (36.7 C) (Temporal)  Resp 14  Ht 5' 4"  (1.626 m)  Wt 221 lb (100.245 kg)  BMI 37.92 kg/m2  Gen: alert, NAD, non-toxic appearing Pupils: equal, no scleral icterus Pulm: Lungs clear to auscultation, symmetric chest rise CV: regular rate and rhythm Abd: soft, nontender, nondistended. Well-healed trocar sites. No cellulitis. No incisional hernia Ext: no edema, no calf tenderness Skin: no rash, no jaundice     Assessment:     Status post arthroscopic Roux-en-Y gastric bypass Obstructive sleep apnea on CPAP GERD Bipolar disorder Hyperlipidemia     Plan:      Overall I think she is doing well. Her weight loss to date has been approximately 38 pounds. I congratulated her her weight loss.  I don't think there is anything medically wrong with her anatomy. She does have a very small gastric pouch. This is not consistent with an ulcer or stricture. I explained to the patient that some foods cause problems regardless of how well they are chewed. We discussed that she can may be a chronic problem for her. I've asked her to attend a support group for suggestions. We discussed importance of routine exercise. With respect to her constipation I advised her to take a stool softener and MiraLAX daily.  For now I've recommended that she avoid hard proteins like chicken and beef and stick to softer proteins for now   F/u 3 months  Solace Wendorff M. Redmond Pulling, MD, FACS General, Bariatric, & Minimally Invasive Surgery [facility], [place]

## 2014-02-03 ENCOUNTER — Encounter: Payer: BC Managed Care – PPO | Attending: General Surgery | Admitting: Dietician

## 2014-02-03 VITALS — Ht 64.0 in | Wt 214.0 lb

## 2014-02-03 DIAGNOSIS — Z6841 Body Mass Index (BMI) 40.0 and over, adult: Secondary | ICD-10-CM | POA: Insufficient documentation

## 2014-02-03 DIAGNOSIS — Z713 Dietary counseling and surveillance: Secondary | ICD-10-CM | POA: Insufficient documentation

## 2014-02-03 NOTE — Patient Instructions (Addendum)
  TANITA  BODY COMP RESULTS  11/25/2013 12/21/2013 02/03/2014   BMI (kg/m^2) 44.1 40.6 36.7   Fat Mass (lbs) 135 118.5 99.5   Fat Free Mass (lbs) 122 118.0 114.5   Total Body Water (lbs) 89.5 86.5 84

## 2014-02-03 NOTE — Progress Notes (Signed)
  Follow-up visit:  8 Weeks Post-Operative RYGB Surgery  Medical Nutrition Therapy:  Appt start time: 0034 end time:  1615.  Primary concerns today: Post-operative Bariatric Surgery Nutrition Management.  Quanda returns today reporting feeling well. She has tried some foods that are not recommended; her fiance is 2 months post op RYGB as well and "can eat pretty much anything" and she sometimes tries things he is eating. Shayley states she cannot tolerate chicken, lunchmeat, or milk.. Eating more fish and beef for protein. She tried salad this week and had no problem. Having sugarfree jello and pudding.  Surgery date: 12/06/2013 Surgery type: RYGB Start weight at [facility]: 264 lbs on 10/06/2013 Weight today: 214 lbs Weight change: 22 lbs Total weight loss: 50 lbs   TANITA  BODY COMP RESULTS  11/25/2013 12/21/13 02/03/2014   BMI (kg/m^2) 44.1 40.6 36.7   Fat Mass (lbs) 135 118.5 99.5   Fat Free Mass (lbs) 122 118.0 114.5   Total Body Water (lbs) 89.5 86.5 84    Preferred Learning Style:   No preference indicated   Learning Readiness:  Ready  24-hr recall: B (AM): Can't eat 1st thing in the morning Snk (AM): 2/3 Premier protein shake  (20g) L (PM): 1-1.5 eggs (6-9g) Snk (PM): Dannon Light and Fit [place] yogurt (12g)  D (PM): tuna fish and some of a shake or soup (14-28g) Snk (PM): fish, lobster, shrimp, crab legs (7g)  Fluid intake: 1 gallon of water a day Estimated total protein intake: 60+ grams per day  Medications: Lamictal only Supplementation: sometimes forgets  Using straws: no Drinking while eating: yes, a few sips Hair loss: no Carbonated beverages: no N/V/D/C: vomiting when eats too much or too fast; some constipation, resolved with Miralax Dumping syndrome: no  Recent physical activity:  Rode 6 miles on a bike today; rides bike when possible and walks 2-3 miles at work  Progress Towards Goal(s):  In progress.  Handouts given during visit include:  Phase 3B  lean protein + non-starchy vegetables   Nutritional Diagnosis:  [place]-3.3 Overweight/obesity related to past poor dietary habits and physical inactivity as evidenced by patient w/ recent RYGB surgery following dietary guidelines for continued weight loss.     Intervention:  Nutrition counseling provided.  Teaching Method Utilized:  Visual Auditory  Barriers to learning/adherence to lifestyle change: none  Demonstrated degree of understanding via:  Teach Back   Monitoring/Evaluation:  Dietary intake, exercise, and body weight. Follow up in 2 months for 4 month post-op visit.

## 2014-02-15 ENCOUNTER — Ambulatory Visit: Payer: BC Managed Care – PPO

## 2014-03-21 ENCOUNTER — Ambulatory Visit: Payer: BC Managed Care – PPO | Admitting: Internal Medicine

## 2014-04-05 ENCOUNTER — Telehealth: Payer: Self-pay | Admitting: Nurse Practitioner

## 2014-04-05 DIAGNOSIS — R21 Rash and other nonspecific skin eruption: Secondary | ICD-10-CM

## 2014-04-05 NOTE — Telephone Encounter (Signed)
Referral made 

## 2014-04-07 ENCOUNTER — Ambulatory Visit: Payer: BC Managed Care – PPO | Admitting: Dietician

## 2014-04-26 ENCOUNTER — Ambulatory Visit: Payer: BC Managed Care – PPO | Admitting: Internal Medicine

## 2014-06-22 ENCOUNTER — Other Ambulatory Visit: Payer: Self-pay | Admitting: Nurse Practitioner

## 2014-06-24 NOTE — Telephone Encounter (Signed)
Patient last seen in office on 04-08-13. Please advise on refill

## 2014-06-24 NOTE — Telephone Encounter (Signed)
Denied has not been seen since 03/2013

## 2014-08-09 ENCOUNTER — Telehealth: Payer: Self-pay | Admitting: Nurse Practitioner

## 2014-08-09 NOTE — Telephone Encounter (Signed)
Appointment given for 12/15 with Ronnald Collum, FNP at 1

## 2014-08-17 ENCOUNTER — Telehealth: Payer: Self-pay | Admitting: Nurse Practitioner

## 2014-08-17 NOTE — Telephone Encounter (Signed)
Patient advised to eat small, frequent meals and keep a close watch on BS and to keep appointment with Ronnald Collum on Tuesday. Patient wants to know if you can see her sooner than Tuesday

## 2014-08-17 NOTE — Telephone Encounter (Signed)
Agree with what patient was told

## 2014-08-18 ENCOUNTER — Ambulatory Visit (INDEPENDENT_AMBULATORY_CARE_PROVIDER_SITE_OTHER): Payer: Commercial Indemnity | Admitting: Endocrinology

## 2014-08-18 ENCOUNTER — Encounter: Payer: Self-pay | Admitting: Endocrinology

## 2014-08-18 VITALS — BP 136/78 | HR 85 | Temp 97.5°F | Ht 64.0 in | Wt 167.0 lb

## 2014-08-18 DIAGNOSIS — K7581 Nonalcoholic steatohepatitis (NASH): Secondary | ICD-10-CM | POA: Insufficient documentation

## 2014-08-18 DIAGNOSIS — R739 Hyperglycemia, unspecified: Secondary | ICD-10-CM

## 2014-08-18 DIAGNOSIS — Z9884 Bariatric surgery status: Secondary | ICD-10-CM

## 2014-08-18 LAB — CBC WITH DIFFERENTIAL/PLATELET
Basophils Absolute: 0 10*3/uL (ref 0.0–0.1)
Basophils Relative: 0 % (ref 0–1)
Eosinophils Absolute: 0.1 10*3/uL (ref 0.0–0.7)
Eosinophils Relative: 2 % (ref 0–5)
HCT: 31.3 % — ABNORMAL LOW (ref 36.0–46.0)
Hemoglobin: 10.1 g/dL — ABNORMAL LOW (ref 12.0–15.0)
Lymphocytes Relative: 49 % — ABNORMAL HIGH (ref 12–46)
Lymphs Abs: 1.8 10*3/uL (ref 0.7–4.0)
MCH: 24.4 pg — ABNORMAL LOW (ref 26.0–34.0)
MCHC: 32.3 g/dL (ref 30.0–36.0)
MCV: 75.6 fL — ABNORMAL LOW (ref 78.0–100.0)
MPV: 9.4 fL (ref 9.4–12.4)
Monocytes Absolute: 0.3 10*3/uL (ref 0.1–1.0)
Monocytes Relative: 9 % (ref 3–12)
Neutro Abs: 1.4 10*3/uL — ABNORMAL LOW (ref 1.7–7.7)
Neutrophils Relative %: 40 % — ABNORMAL LOW (ref 43–77)
Platelets: 264 10*3/uL (ref 150–400)
RBC: 4.14 MIL/uL (ref 3.87–5.11)
RDW: 14.1 % (ref 11.5–15.5)
WBC: 3.6 10*3/uL — ABNORMAL LOW (ref 4.0–10.5)

## 2014-08-18 LAB — BASIC METABOLIC PANEL
BUN: 9 mg/dL (ref 6–23)
CO2: 27 meq/L (ref 19–32)
Calcium: 9.3 mg/dL (ref 8.4–10.5)
Chloride: 104 mEq/L (ref 96–112)
Creat: 0.81 mg/dL (ref 0.50–1.10)
GLUCOSE: 84 mg/dL (ref 70–99)
POTASSIUM: 4.3 meq/L (ref 3.5–5.3)
Sodium: 140 mEq/L (ref 135–145)

## 2014-08-18 LAB — HEPATIC FUNCTION PANEL
ALT: 14 U/L (ref 0–35)
AST: 12 U/L (ref 0–37)
Albumin: 4.2 g/dL (ref 3.5–5.2)
Alkaline Phosphatase: 84 U/L (ref 39–117)
BILIRUBIN DIRECT: 0.1 mg/dL (ref 0.0–0.3)
Indirect Bilirubin: 0.4 mg/dL (ref 0.2–1.2)
Total Bilirubin: 0.5 mg/dL (ref 0.2–1.2)
Total Protein: 6.7 g/dL (ref 6.0–8.3)

## 2014-08-18 LAB — LIPID PANEL
Cholesterol: 162 mg/dL (ref 0–200)
HDL: 47 mg/dL (ref >39)
LDL Cholesterol: 101 mg/dL — ABNORMAL HIGH (ref 0–99)
Total CHOL/HDL Ratio: 3.4 Ratio
Triglycerides: 71 mg/dL (ref <150)
VLDL: 14 mg/dL (ref 0–40)

## 2014-08-18 LAB — HEMOGLOBIN A1C
Hgb A1c MFr Bld: 5.7 % — ABNORMAL HIGH (ref <5.7)
Mean Plasma Glucose: 117 mg/dL — ABNORMAL HIGH (ref <117)

## 2014-08-18 LAB — TSH: TSH: 1.093 u[IU]/mL (ref 0.350–4.500)

## 2014-08-18 MED ORDER — ACARBOSE 25 MG PO TABS: 25.0000 mg | ORAL_TABLET | Freq: Three times a day (TID) | ORAL | Status: DC

## 2014-08-18 NOTE — Telephone Encounter (Signed)
No appointments available but if any problems, come by and see triage nurse.

## 2014-08-18 NOTE — Patient Instructions (Addendum)
i have sent a prescription to your pharmacy, for "acarbose." This has side-effects of bloating, gas, and diarrhea, but these go away with time. blood tests are being requested for you today.  We'll let you know about the results. The low blood sugar problem also usually goes away with time.   Here is a meter and some strips.  Please continue to check as needed.  Please come back for a follow-up appointment in 3 months.

## 2014-08-18 NOTE — Progress Notes (Signed)
Subjective:    Patient ID: Theresa [name], female    DOB: [birth year], 43 y.o.   MRN: [id number]  HPI pt had gastric bypass surgery in early 2015.  Since then, she has lost 104 lbs.  She now has 2 weeks of moderate hypoglycemia, which happens approx 1 hr after eating.  She has assoc myalgias and tremor.  No cramping in the abdomen. During the sxs, cbg's have varied from 42-97. sxs are resolved by eating or drinking.  sxs can happen with any meal of the day, but are worse with a bigger meal. Past Medical History  Diagnosis Date  . Bipolar disorder   . Attention deficit disorder   . Hypertension   . Hyperlipidemia   . Sleep apnea, obstructive     cpap used auto settings  . H/O transfusion     17 yrs ago postpartum  . GERD (gastroesophageal reflux disease)     Past Surgical History  Procedure Laterality Date  . Cesarean section    . Tubal ligation    . 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 Marital Status: Divorced    Spouse Name: N/A    Number of Children: 60  . Years of Education: N/A   Occupational History  . Paramedic    Social History Main Topics  . Smoking status: Never Smoker   . Smokeless tobacco: Not on file  . Alcohol Use: Yes     Comment: rare-occ. monthly  . Drug Use: No  . Sexual Activity: Yes   Other Topics Concern  . Not on file   Social History Narrative    Current Outpatient Prescriptions on File Prior to Visit  Medication Sig Dispense Refill  . lamoTRIgine (LAMICTAL) 150 MG tablet Take 1 tablet (150 mg total) by mouth daily. 90 tablet 1  . acetaminophen (TYLENOL) 325 MG tablet Take 650 mg by mouth every 6 (six) hours as needed (Pain).    [name] ALPRAZolam (XANAX) 1 MG tablet Take 1 mg by mouth 2 (two) times daily as needed for anxiety.     No current  facility-administered medications on file prior to visit.    No Known Allergies  Family History  Problem Relation Age of Onset  . Hypertension Mother   . Cancer Mother     breast  . Diabetes Mother   . Hyperlipidemia Mother   . Thyroid disease Mother   . Hypertension Father   . Diabetes Father     BP 136/78 mmHg  Pulse 85  Temp(Src) 97.5 F (36.4 C) (Oral)  Ht 5\' 4"  (1.626 m)  Wt 167 lb (75.751 kg)  BMI 28.65 kg/m2  SpO2 93%   Review of Systems Denies diarrhea and n/v.      Objective:   Physical Exam VITAL SIGNS:  See vs page GENERAL: no distress ABDOMEN: abdomen is soft, nontender.  no hepatosplenomegaly. not distended.  no hernia     i reviewed electrocardiogram: 10/18/13.  Lab Results  Component Value Date   HGBA1C 5.9* 10/18/2013      Assessment & Plan:  Mild hypoglycemia, due to dumping, new.  The hypoglycemia often gets better with time.   Morbid obesity, much better with surgery. Hyperglycemia: she is due for recheck.  The hypoglycemia is an indication of dysfunctional beta cell function, and is  a risk factor for DM.    Patient is advised the following: Patient Instructions  i have sent a prescription to your pharmacy, for "acarbose." This has side-effects of bloating, gas, and diarrhea, but these go away with time. blood tests are being requested for you today.  We'll let you know about the results. The low blood sugar problem also usually goes away with time.   Here is a meter and some strips.  Please continue to check as needed.  Please come back for a follow-up appointment in 3 months.

## 2014-08-19 LAB — PTH, INTACT AND CALCIUM
CALCIUM: 9.3 mg/dL (ref 8.4–10.5)
PTH: 91 pg/mL — AB (ref 14–64)

## 2014-08-23 ENCOUNTER — Ambulatory Visit: Payer: BC Managed Care – PPO | Admitting: Nurse Practitioner

## 2014-08-24 ENCOUNTER — Ambulatory Visit: Payer: BC Managed Care – PPO | Admitting: Nurse Practitioner

## 2014-08-25 ENCOUNTER — Other Ambulatory Visit (INDEPENDENT_AMBULATORY_CARE_PROVIDER_SITE_OTHER): Payer: Self-pay

## 2014-08-25 DIAGNOSIS — Z8639 Personal history of other endocrine, nutritional and metabolic disease: Secondary | ICD-10-CM

## 2014-08-25 DIAGNOSIS — Z9884 Bariatric surgery status: Secondary | ICD-10-CM

## 2014-08-25 DIAGNOSIS — I159 Secondary hypertension, unspecified: Secondary | ICD-10-CM

## 2014-09-09 HISTORY — PX: AUGMENTATION MAMMAPLASTY: SUR837

## 2014-10-28 ENCOUNTER — Other Ambulatory Visit: Payer: BC Managed Care – PPO | Admitting: Nurse Practitioner

## 2014-11-15 ENCOUNTER — Encounter: Payer: Self-pay | Admitting: Nurse Practitioner

## 2014-11-15 ENCOUNTER — Ambulatory Visit (INDEPENDENT_AMBULATORY_CARE_PROVIDER_SITE_OTHER): Payer: BLUE CROSS/BLUE SHIELD | Admitting: Nurse Practitioner

## 2014-11-15 ENCOUNTER — Ambulatory Visit: Payer: Commercial Indemnity | Admitting: Endocrinology

## 2014-11-15 ENCOUNTER — Telehealth: Payer: Self-pay

## 2014-11-15 VITALS — BP 129/87 | HR 76 | Temp 99.3°F | Ht 64.0 in | Wt 163.0 lb

## 2014-11-15 DIAGNOSIS — N921 Excessive and frequent menstruation with irregular cycle: Secondary | ICD-10-CM

## 2014-11-15 DIAGNOSIS — R7989 Other specified abnormal findings of blood chemistry: Secondary | ICD-10-CM

## 2014-11-15 DIAGNOSIS — Z9884 Bariatric surgery status: Secondary | ICD-10-CM

## 2014-11-15 DIAGNOSIS — E215 Disorder of parathyroid gland, unspecified: Secondary | ICD-10-CM

## 2014-11-15 DIAGNOSIS — Z1239 Encounter for other screening for malignant neoplasm of breast: Secondary | ICD-10-CM

## 2014-11-15 DIAGNOSIS — Z13820 Encounter for screening for osteoporosis: Secondary | ICD-10-CM

## 2014-11-15 DIAGNOSIS — H6983 Other specified disorders of Eustachian tube, bilateral: Secondary | ICD-10-CM

## 2014-11-15 LAB — COMPREHENSIVE METABOLIC PANEL
ALBUMIN: 4.4 g/dL (ref 3.5–5.2)
ALT: 18 U/L (ref 0–35)
AST: 15 U/L (ref 0–37)
Alkaline Phosphatase: 83 U/L (ref 39–117)
BILIRUBIN TOTAL: 0.4 mg/dL (ref 0.2–1.2)
BUN: 7 mg/dL (ref 6–23)
CO2: 28 mEq/L (ref 19–32)
Calcium: 9.5 mg/dL (ref 8.4–10.5)
Chloride: 107 mEq/L (ref 96–112)
Creatinine, Ser: 0.74 mg/dL (ref 0.40–1.20)
GFR: 91.84 mL/min (ref >60.00)
GLUCOSE: 90 mg/dL (ref 70–99)
POTASSIUM: 4.1 meq/L (ref 3.5–5.1)
SODIUM: 139 meq/L (ref 135–145)
TOTAL PROTEIN: 6.8 g/dL (ref 6.0–8.3)

## 2014-11-15 LAB — CBC WITH DIFFERENTIAL/PLATELET
BASOS PCT: 0.2 % (ref 0.0–3.0)
Basophils Absolute: 0 10*3/uL (ref 0.0–0.1)
EOS PCT: 0.6 % (ref 0.0–5.0)
Eosinophils Absolute: 0 10*3/uL (ref 0.0–0.7)
HCT: 30.3 % — ABNORMAL LOW (ref 36.0–46.0)
Hemoglobin: 9.7 g/dL — ABNORMAL LOW (ref 12.0–15.0)
LYMPHS PCT: 24.8 % (ref 12.0–46.0)
Lymphs Abs: 1.8 10*3/uL (ref 0.7–4.0)
MCHC: 31.9 g/dL (ref 30.0–36.0)
MCV: 71.1 fl — ABNORMAL LOW (ref 78.0–100.0)
MONO ABS: 0.5 10*3/uL (ref 0.1–1.0)
Monocytes Relative: 6.3 % (ref 3.0–12.0)
NEUTROS PCT: 68.1 % (ref 43.0–77.0)
Neutro Abs: 4.9 10*3/uL (ref 1.4–7.7)
PLATELETS: 302 10*3/uL (ref 150.0–400.0)
RBC: 4.26 Mil/uL (ref 3.87–5.11)
RDW: 16.5 % — ABNORMAL HIGH (ref 11.5–15.5)
WBC: 7.2 10*3/uL (ref 4.0–10.5)

## 2014-11-15 LAB — VITAMIN D 25 HYDROXY (VIT D DEFICIENCY, FRACTURES): VITD: 35.68 ng/mL (ref 30.00–100.00)

## 2014-11-15 LAB — VITAMIN B12: Vitamin B-12: 206 pg/mL — ABNORMAL LOW (ref 211–911)

## 2014-11-15 LAB — TSH: TSH: 1.13 u[IU]/mL (ref 0.35–4.50)

## 2014-11-15 LAB — MAGNESIUM: Magnesium: 2.2 mg/dL (ref 1.5–2.5)

## 2014-11-15 LAB — FOLATE: FOLATE: 9.4 ng/mL (ref >5.9)

## 2014-11-15 MED ORDER — FLUTICASONE PROPIONATE 50 MCG/ACT NA SUSP: 1.0000 | Freq: Two times a day (BID) | NASAL | Status: DC

## 2014-11-15 NOTE — Telephone Encounter (Signed)
Patient forgot to ask you while in office.Theresa KitchenMarland KitchenMarland KitchenShe has a burn mark on her right hand on her knuckles, she keeps busting it open and is wondering if there is anything specific that she needs to put on it to help it not get infected. 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

## 2014-11-15 NOTE — Patient Instructions (Signed)
Please get mammogram done.  See gynecology to discuss options for heavy bleeding.  Start flonase & daily sinus rinses.  Consider using pesudoephedrine 30 mg twice daily & 400 mg ibuprophen for sinus headache.  Great job with weight loss!! Keep up the good work!  See me in 3 months or sooner if ears aren't feeling better.  My office will call with lab results.

## 2014-11-15 NOTE — Progress Notes (Signed)
Pre visit review using our clinic review tool, if applicable. No additional management support is needed unless otherwise documented below in the visit note. 

## 2014-11-16 NOTE — Telephone Encounter (Signed)
pls call pt: Advise A&D ointment is good. She may continue to use that, but likely needs to keep wound covered for faster healing.  Regarding DEXA scan: The American Society for Metabolic & Bariatric Surgery published a position statement in 2014 that does not recommend DXA after gastric bypass because studies do not show significant changes in bone mineral density. Please have her schedule OV if she has questions.   Pls cancel order for DXA scan.

## 2014-11-16 NOTE — Telephone Encounter (Signed)
Called and informed patient about A&D ointment on hand. Also informed her about the bone density scan, but she said that she didn't want one...that she just needed the Mammogram and OB/GYN appointments. I told her that those appointments were going to be scheduled as a referral and someone will call her back.

## 2014-11-17 LAB — VITAMIN B1

## 2014-11-18 ENCOUNTER — Telehealth: Payer: Self-pay | Admitting: Nurse Practitioner

## 2014-11-18 ENCOUNTER — Other Ambulatory Visit: Payer: Self-pay | Admitting: Nurse Practitioner

## 2014-11-18 DIAGNOSIS — D519 Vitamin B12 deficiency anemia, unspecified: Secondary | ICD-10-CM

## 2014-11-18 LAB — PTH, INTACT AND CALCIUM
CALCIUM: 9.3 mg/dL (ref 8.4–10.5)
PTH: 61 pg/mL (ref 14–64)

## 2014-11-18 MED ORDER — CYANOCOBALAMIN 1000 MCG/ML IJ SOLN: 1000.0000 ug | INTRAMUSCULAR | Status: DC

## 2014-11-18 NOTE — Telephone Encounter (Signed)
LMOVM for patient to return call concerning lab results.  

## 2014-11-18 NOTE — Telephone Encounter (Signed)
Called and spoke with patient. Informed her of her lab results. She would like to do her B-12 injections at home.

## 2014-11-18 NOTE — Telephone Encounter (Signed)
pls call pt: Advise Labs-B12 low. Needs to start weekly B12 injections for 4 weeks. Ask if she wants to self administer or come to office. Schedule OV on or near 4th injection. Hgb 9.7-possibly due to low B12. It may be due to low iron-I have requested to add iron panel to labs. Continue D3 daily-2000 iu. All other labs look great!

## 2014-11-20 ENCOUNTER — Encounter: Payer: Self-pay | Admitting: Nurse Practitioner

## 2014-11-20 NOTE — Progress Notes (Signed)
Subjective:     Theresa [name] is a 44 y.o. female and is to establish care. She has had recent primary care. She is currently followed by Dr Loanne Drilling post gastric bypass. She has lost 100 lb. in 1 year. 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  Currently, she feels well & is continuiung to lose weight. She is not taking any meds. She is taking a vit B supplement. She stopped acarbose when bs dropped to 45. She says she "ran out" of [place] has remained off these meds. She reports no instability of mood. She is sleeping well. She c/o irreg MC in last few mos: short cycles w/heavy bleeding. Last pap 2014 nml. Last MMG 11/2013 nml.  Also c/o nasal congestion & "stopped up ears". Denies sore throat, cough, fever. Not taking anything for symptoms. Last Hgb was low at 10.1, WBC 3.6, PTH elevated at 91.    History   Social History  . Marital Status: Married    Spouse Name: N/A  . Number of Children: 4  . Years of Education: N/A   Occupational History  . Paramedic    Social History Main Topics  . Smoking status: Never Smoker   . Smokeless tobacco: Not on file  . Alcohol Use: Yes     Comment: rare-occ. monthly  . Drug Use: No  . Sexual Activity: Yes    Birth Control/ Protection: Surgical   Other Topics Concern  . Not on file   Social History Narrative   Ms Theresa [name] lives with her husband some of her children & 1 grand-son. She works FT as Audiological scientist, also teaches.   Health Maintenance  Topic Date Due  . INFLUENZA VACCINE  04/10/2015  . PAP SMEAR  01/06/2016  . TETANUS/TDAP  07/22/2024  . HIV Screening  Completed    The following portions of the patient's history were reviewed and updated as appropriate: allergies, current medications, past medical history, past social history, past surgical history and problem list.  Review of Systems Constitutional: negative for fatigue Eyes:  negative for visual disturbance Cardiovascular: negative for irregular heart beat Musculoskeletal:negative for muscle weakness and myalgias Neurological: negative for headaches, paresthesia and weakness Behavioral/Psych: negative for anxiety, bad mood, depression, excessive alcohol consumption, fatigue, illegal drug usage, irritability, mood swings, sleep disturbance and tobacco use Endocrine: negative for diabetic symptoms including polydipsia, polyphagia and polyuria   Objective:    BP 129/87 mmHg  Pulse 76  Temp(Src) 99.3 F (37.4 C) (Temporal)  Ht 5\' 4"  (1.626 m)  Wt 163 lb (73.936 kg)  BMI 27.97 kg/m2  SpO2 100%  LMP 10/24/2014 (Approximate) General appearance: alert, cooperative, appears stated age and no distress Head: Normocephalic, without obvious abnormality, atraumatic Eyes: negative findings: lids and lashes normal and conjunctivae and sclerae normal Ears: normal TM's and external ear canals both ears Throat: lips, mucosa, and tongue normal; teeth and gums normal Neck: no adenopathy, no carotid bruit, supple, symmetrical, trachea midline and thyroid not enlarged, symmetric, no tenderness/mass/nodules Lungs: clear to auscultation bilaterally Heart: regular rate and rhythm, S1, S2 normal, no murmur, click, rub or gallop Abdomen: soft, non-tender; bowel sounds normal; no masses,  no organomegaly Neurologic: Grossly normal    Assessment:Plan   1. Breast cancer screening - MM DIGITAL SCREENING BILATERAL; Future  2. Menorrhagia with irregular cycle - Ambulatory referral to Gynecology  3. History of gastric bypass Risk for vitamin, iron def - TSH -  Vitamin B1 - Vitamin B12 - Vit D  25 hydroxy (rtn osteoporosis monitoring) - Comprehensive metabolic panel - Magnesium - CBC with Differential/Platelet - Folate  4. Elevated PTHrP level F/u w/ Dr Loanne Drilling - PTH, intact and calcium  5. Eustachian tube dysfunction, bilateral Likely allergy - fluticasone (FLONASE) 50  MCG/ACT nasal spray; Place 1 spray into both nostrils 2 (two) times daily.  Dispense: 16 g; Refill: 0  F/u 3 mos.

## 2014-11-30 ENCOUNTER — Other Ambulatory Visit: Payer: BLUE CROSS/BLUE SHIELD

## 2014-11-30 ENCOUNTER — Ambulatory Visit: Payer: BLUE CROSS/BLUE SHIELD

## 2014-12-26 ENCOUNTER — Ambulatory Visit (HOSPITAL_BASED_OUTPATIENT_CLINIC_OR_DEPARTMENT_OTHER): Payer: BLUE CROSS/BLUE SHIELD

## 2015-01-05 ENCOUNTER — Encounter: Payer: Self-pay | Admitting: Endocrinology

## 2015-01-05 ENCOUNTER — Encounter: Payer: Self-pay | Admitting: Nurse Practitioner

## 2015-03-01 ENCOUNTER — Encounter: Payer: Self-pay | Admitting: Nurse Practitioner

## 2015-04-02 ENCOUNTER — Encounter: Payer: Self-pay | Admitting: General Surgery

## 2015-04-20 ENCOUNTER — Telehealth: Payer: Self-pay | Admitting: Family Medicine

## 2015-04-20 ENCOUNTER — Encounter: Payer: Self-pay | Admitting: Family Medicine

## 2015-04-20 DIAGNOSIS — D649 Anemia, unspecified: Secondary | ICD-10-CM | POA: Insufficient documentation

## 2015-04-20 NOTE — Telephone Encounter (Signed)
Please advise 

## 2015-04-20 NOTE — Telephone Encounter (Signed)
LMOM for pt to CB.  

## 2015-04-20 NOTE — Telephone Encounter (Signed)
Patient aware.

## 2015-04-20 NOTE — Telephone Encounter (Signed)
She can take iron up to three times a day. Please advise her to seek emergent treatment If she has any chest pain, shortness of breath, dizziness or passes out. Otherwise we will see her on her scheduled appointment.

## 2015-04-20 NOTE — Telephone Encounter (Signed)
Patient has made an appt for Monday 04/27/15. She would like to know if she should be taking OTC iron pills until her appt. Please advise.

## 2015-04-24 ENCOUNTER — Encounter: Payer: Self-pay | Admitting: Family Medicine

## 2015-04-24 ENCOUNTER — Ambulatory Visit (INDEPENDENT_AMBULATORY_CARE_PROVIDER_SITE_OTHER): Payer: BLUE CROSS/BLUE SHIELD | Admitting: Family Medicine

## 2015-04-24 VITALS — BP 129/87 | HR 72 | Temp 98.6°F | Resp 18 | Ht 64.0 in | Wt 159.0 lb

## 2015-04-24 DIAGNOSIS — D500 Iron deficiency anemia secondary to blood loss (chronic): Secondary | ICD-10-CM | POA: Diagnosis not present

## 2015-04-24 DIAGNOSIS — Z0000 Encounter for general adult medical examination without abnormal findings: Secondary | ICD-10-CM

## 2015-04-24 LAB — CBC WITH DIFFERENTIAL/PLATELET
BASOS PCT: 0.5 % (ref 0.0–3.0)
Basophils Absolute: 0 10*3/uL (ref 0.0–0.1)
EOS ABS: 0.1 10*3/uL (ref 0.0–0.7)
Eosinophils Relative: 1.8 % (ref 0.0–5.0)
HCT: 33.3 % — ABNORMAL LOW (ref 36.0–46.0)
Hemoglobin: 10.2 g/dL — ABNORMAL LOW (ref 12.0–15.0)
Lymphocytes Relative: 33.6 % (ref 12.0–46.0)
Lymphs Abs: 1.6 10*3/uL (ref 0.7–4.0)
MCHC: 30.6 g/dL (ref 30.0–36.0)
MCV: 68.2 fl — ABNORMAL LOW (ref 78.0–100.0)
MONO ABS: 0.4 10*3/uL (ref 0.1–1.0)
Monocytes Relative: 8.1 % (ref 3.0–12.0)
NEUTROS ABS: 2.7 10*3/uL (ref 1.4–7.7)
Neutrophils Relative %: 56 % (ref 43.0–77.0)
PLATELETS: 318 10*3/uL (ref 150.0–400.0)
RBC: 4.89 Mil/uL (ref 3.87–5.11)
RDW: 22.8 % — AB (ref 11.5–15.5)
WBC: 4.8 10*3/uL (ref 4.0–10.5)

## 2015-04-24 LAB — RETICULOCYTES
ABS RETIC: 67.9 10*3/uL (ref 19.0–186.0)
RBC.: 4.85 MIL/uL (ref 3.87–5.11)
Retic Ct Pct: 1.4 % (ref 0.4–2.3)

## 2015-04-24 LAB — IBC PANEL
Iron: 394 ug/dL — ABNORMAL HIGH (ref 42–145)
Saturation Ratios: 79.7 % — ABNORMAL HIGH (ref 20.0–50.0)
Transferrin: 353 mg/dL (ref 212.0–360.0)

## 2015-04-24 LAB — FERRITIN: Ferritin: 77.8 ng/mL (ref 10.0–291.0)

## 2015-04-24 LAB — FOLATE: FOLATE: 12 ng/mL (ref >5.9)

## 2015-04-24 LAB — VITAMIN B12: VITAMIN B 12: 1140 pg/mL — AB (ref 211–911)

## 2015-04-24 NOTE — Progress Notes (Signed)
Pre visit review using our clinic review tool, if applicable. No additional management support is needed unless otherwise documented below in the visit note. 

## 2015-04-24 NOTE — Progress Notes (Signed)
Subjective:    Patient ID: Theresa [name], female    DOB: [birth year], 44 y.o.   MRN: [id number]  HPI  Anemia: Patient was found to be anemic at her plastic surgeon's office. At that time her hemoglobin was 7.1, MCV 61.9, medico 23.9, WBC 3.1, platelets 308. RDW 18.7. Fecal occult blood negative. Per patient report, she was giving 3 units of blood, 2 doses of IV iron, B-12 in the emergency room. Patient was advised of red flags, and when to seek emergent treatment, patient went to the emergency room in [facility]. She states at that time she was having chest pain, that she contributed to possibly a pulled muscle since it had been going on for some time, Dull in nature and started 2 weeks ago. She also noticed increasing fatigue and shortness of breath on exertion. She endorses a headache that is dull in nature for the past 2 weeks as well. Patient has a history of menorrhagia, and is being evaluated by OB/GYN. She was ordered to get a pelvic ultrasound, but has been unable to do this as she needs the ultrasound in her insurance network or it was going to cost her over $1000. She states her menses are heavy, occur 1-2 times a month lasting 3-7 days and are mostly clot formation. Patient has a history of uterine fibroids. Patient was scheduled to have a tummy tuck on September 8, anemia was found on preop blood work. Patient has had a gastric bypass in the past, without complications. Prior to discharge from emergency room her white count normalized, hemoglobin 9, hematocrit 29.1, MCV 67.2, RDW 32, platelet 259. Patient states they completed troponins which were normal, and EKG which was normal, records are not available for cardiac workup.  Health maintenance: Patient with family history of breast cancer, she is due for her mammogram. She states that it was placed with a bone density screening in the computer, in which he went to schedule they were acceptable canceled because she is not  eligible for a bone density screening. Patient receives her flu shot through her employment, and regular Paps through her OB/GYN. She is up-to-date on her tetanus vaccination. Patient has a family history of colon cancer.  Nonsmoker Past Medical History  Diagnosis Date  . Bipolar disorder   . Attention deficit disorder   . Hypertension   . Hyperlipidemia   . Sleep apnea, obstructive     cpap used auto settings  . H/O transfusion     17 yrs ago postpartum  . GERD (gastroesophageal reflux disease)    No Known Allergies Past Surgical History  Procedure Laterality Date  . Cesarean section    . Tubal ligation    . 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 Hypertension Mother   . Cancer Mother 48    breast  . Diabetes Mother   . Hyperlipidemia Mother   . Thyroid disease Mother   . Hypertension Father   . Diabetes Father   . Cancer Maternal Grandmother     Breast  . Hyperlipidemia Maternal Grandmother   . Heart disease Maternal Grandmother   . Stroke Maternal Grandmother   . Hypertension Maternal Grandmother   . Hyperlipidemia Maternal Grandfather   . Hypertension Maternal Grandfather   . Cancer Paternal Grandmother  Colon  . Hyperlipidemia Paternal Grandmother   . Heart disease Paternal Grandmother   . Hyperlipidemia Paternal Grandfather   . Hypertension Paternal Grandfather    Social History   Social History  . Marital Status: Married    Spouse Name: N/A  . Number of Children: 4  . Years of Education: N/A   Occupational History  . Paramedic    Social History Main Topics  . Smoking status: Never Smoker   . Smokeless tobacco: Not on file  . Alcohol Use: Yes     Comment: rare-occ. monthly  . Drug Use: No  . Sexual Activity: Yes    Birth Control/ Protection:  Surgical   Other Topics Concern  . Not on file   Social History Narrative   [name] lives with her husband some of her children & 1 grand-son. She works FT as Audiological scientist, also teaches.   Review of Systems  Constitutional: Positive for fatigue. Negative for fever, chills, diaphoresis, activity change, appetite change and unexpected weight change.  HENT: Negative for nosebleeds.   Respiratory: Positive for chest tightness and shortness of breath. Negative for cough.   Cardiovascular: Positive for chest pain. Negative for palpitations and leg swelling.  Gastrointestinal: Negative for nausea, vomiting, diarrhea, constipation, blood in stool, anal bleeding and rectal pain.  Genitourinary: Positive for vaginal bleeding and menstrual problem. Negative for hematuria.  Musculoskeletal: Positive for myalgias and arthralgias.  Skin: Positive for pallor.  Neurological: Positive for headaches. Negative for dizziness, tremors, syncope, speech difficulty, weakness, light-headedness and numbness.      Objective:   Physical Exam BP 147/87 mmHg  Pulse 72  Temp(Src) 98.6 F (37 C) (Temporal)  Resp 18  Ht 5' 4"  (1.626 m)  Wt 159 lb (72.122 kg)  BMI 27.28 kg/m2  SpO2 100% Gen: NAD. Nontoxic in appearance. Well developed, well nourished female.  HEENT: AT. [place]. Bilateral eyes without injections, drainage or icterus. Pale conjunctiva.  MMM. Bilateral nares pale, no erythema or swelling. Throat without erythema or exudates.  CV: RRR No murmur Chest: CTAB, no wheeze or crackles Ext: No erythema. No edema.  Skin: No rashes, purpura or petechiae, bruising.  Neuro: Normal gait. PERLA. EOMi. Alert. Oriented. No focal deficits.     Assessment & Plan:  Theresa [name] is a 44 y.o. female presents for follow up to anemia incidentally found at plastic surgeon's office.  - anemia: Patient has a history of menorrhagia. She is seeing an OB/GYN, and is supposed to have an ultrasound completed of her pelvic.  This is likely the cause of her anemia. She was seen in the emergency room with negative fecal occult, and given blood transfusion 3, B-12 and iron IV. Will repeat CBC, iron panel/anemia panel today. Patient will be called in the morning with results and management will be discussed at that time. Patient encouraged to follow-up with her OB/GYN and get ultrasound of her pelvic ASA P, as this is the cause of her anemia. Patient also encouraged to call Dr. Redmond Pulling, her gastric bypass surgeon as she has fallen out of follow-ups with them. Continue iron 3 times a day, continue B-12. - Discussed signs and symptoms to be evaluated immediately, patient is understanding of all red flags.  - Health maintenance: Mammogram encouraged as soon as possible, this order has been replaced in the system. Her flu shot will be administered through her employment.

## 2015-04-24 NOTE — Patient Instructions (Signed)
Please make an appointment with OB/GYN and Dr. Redmond Pulling.  You need to get your ultrasound completed. We will collect labs today and I will call you in the morning.  Rest for a few days.  Any alarming symptoms go to ED.   Anemia, Nonspecific Anemia is a condition in which the concentration of red blood cells or hemoglobin in the blood is below normal. Hemoglobin is a substance in red blood cells that carries oxygen to the tissues of the body. Anemia results in not enough oxygen reaching these tissues.  CAUSES  Common causes of anemia include:   Excessive bleeding. Bleeding may be internal or external. This includes excessive bleeding from periods (in women) or from the intestine.   Poor nutrition.   Chronic kidney, thyroid, and liver disease.  Bone marrow disorders that decrease red blood cell production.  Cancer and treatments for cancer.  HIV, AIDS, and their treatments.  Spleen problems that increase red blood cell destruction.  Blood disorders.  Excess destruction of red blood cells due to infection, medicines, and autoimmune disorders. SIGNS AND SYMPTOMS   Minor weakness.   Dizziness.   Headache.  Palpitations.   Shortness of breath, especially with exercise.   Paleness.  Cold sensitivity.  Indigestion.  Nausea.  Difficulty sleeping.  Difficulty concentrating. Symptoms may occur suddenly or they may develop slowly.  DIAGNOSIS  Additional blood tests are often needed. These help your health care provider determine the best treatment. Your health care provider will check your stool for blood and look for other causes of blood loss.  TREATMENT  Treatment varies depending on the cause of the anemia. Treatment can include:   Supplements of iron, vitamin P82, or folic acid.   Hormone medicines.   A blood transfusion. This may be needed if blood loss is severe.   Hospitalization. This may be needed if there is significant continual blood loss.    Dietary changes.  Spleen removal. HOME CARE INSTRUCTIONS Keep all follow-up appointments. It often takes many weeks to correct anemia, and having your health care provider check on your condition and your response to treatment is very important. SEEK IMMEDIATE MEDICAL CARE IF:   You develop extreme weakness, shortness of breath, or chest pain.   You become dizzy or have trouble concentrating.  You develop heavy vaginal bleeding.   You develop a rash.   You have bloody or black, tarry stools.   You faint.   You vomit up blood.   You vomit repeatedly.   You have abdominal pain.  You have a fever or persistent symptoms for more than 2-3 days.   You have a fever and your symptoms suddenly get worse.   You are dehydrated.  MAKE SURE YOU:  Understand these instructions.  Will watch your condition.  Will get help right away if you are not doing well or get worse. Document Released: 10/03/2004 Document Revised: 04/28/2013 Document Reviewed: 02/19/2013 [facility] Patient Information 2015 [place], [place]. This information is not intended to replace advice given to you by your health care provider. Make sure you discuss any questions you have with your health care provider.

## 2015-04-25 ENCOUNTER — Telehealth: Payer: Self-pay | Admitting: Family Medicine

## 2015-04-25 NOTE — Telephone Encounter (Signed)
Letter printed and placed at front desk.

## 2015-04-25 NOTE — Telephone Encounter (Signed)
Discussed lab work results with patient. Patient is going to [place], or return at the end of the month. She has scheduled her ultrasound and is in contact with her OB/GYN about a possible hysterectomy versus treatment for menorrhagia. Patient has contacted her surgeon, Dr. Redmond Pulling, and has a appointment to follow-up with them as well. Would like to repeat labs in 3-4 weeks, discussed that she could have these labs drawn when she sees either of her above specialist or she can make a follow-up appointment with [place] during that time and we can obtain labs. Patient will need to be out of work for 1 more day, she will need a work excuse for this, one will be provided and placed behind the desk for her to pick up today.  - Please write an out of work excuse for today, I will sign once completed.

## 2015-04-28 ENCOUNTER — Other Ambulatory Visit: Payer: Self-pay | Admitting: Family Medicine

## 2015-04-28 DIAGNOSIS — Z1239 Encounter for other screening for malignant neoplasm of breast: Secondary | ICD-10-CM

## 2015-04-28 DIAGNOSIS — Z803 Family history of malignant neoplasm of breast: Secondary | ICD-10-CM

## 2015-05-04 ENCOUNTER — Encounter: Payer: Self-pay | Admitting: Family Medicine

## 2015-05-04 ENCOUNTER — Telehealth: Payer: Self-pay

## 2015-05-04 ENCOUNTER — Ambulatory Visit: Payer: BLUE CROSS/BLUE SHIELD

## 2015-05-04 DIAGNOSIS — D500 Iron deficiency anemia secondary to blood loss (chronic): Secondary | ICD-10-CM

## 2015-05-04 NOTE — Telephone Encounter (Signed)
Patient called in today asking for a note stating she could return to full duty on 04/28/15.  We provided a work note for 8/16 & 8/17, but she states she works  24 hour shifts and would not return until 04/28/15, so she needed a note for that day.  She asked if we could fax it to her employer:  [employer], [place] [phone].  -  Also she asked if you could order her labs to be drawn here.  Her doctor,  Dr. Redmond Pulling,  wants her PCP to order.

## 2015-05-05 ENCOUNTER — Encounter: Payer: Self-pay | Admitting: Family Medicine

## 2015-05-05 NOTE — Telephone Encounter (Signed)
LMOM for pt to CB.  Letter printed and faxed.

## 2015-05-05 NOTE — Telephone Encounter (Signed)
Pt's employer called stating that they received our note but she needs a return to work note with no restrictions or a list of restrictions.  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  Please advise.

## 2015-05-05 NOTE — Telephone Encounter (Signed)
Please re-write excuse with no restrictions and the date they need. Then fax. Thanks.

## 2015-05-05 NOTE — Telephone Encounter (Signed)
Please returned patient's call.  - She is requesting a work excuse that states certain specifics specifics, please see her phone message. She also would like this faxed to her employment possible anti-phone number was given in her phone note. Please provide an excuse with her specifics, I will sign him in the office can fax it to the number given. - I have placed future labs that I would like collected after September 15. Patient can have her labs drawn a few days prior to an appointment I need her to schedule  with me,  to follow-up with her. At that appointment we will review her labs and set up infusion appointments if necessary. She should be encouraged to follow-up with her gynecologist (if she has not already), since her anemia is likely coming from her menorrhagia. Thanks.

## 2015-05-05 NOTE — Telephone Encounter (Signed)
Letter printed and faxed

## 2015-05-08 ENCOUNTER — Telehealth: Payer: Self-pay | Admitting: Family Medicine

## 2015-05-08 NOTE — Telephone Encounter (Signed)
Pt called and stated that she wanted to have her hemoglobin and iron rechecked. I verified that the orders for the previously stated are in and also scheduled pt to come in on Friday and have them redrawn.   I saw in previous notes that the pt requested a letter to return to work. I see that there is a note in the chart, however, I am inquiring whether or not she did receive the letter. Please advise if there is anything in addition that I need to do.

## 2015-05-08 NOTE — Telephone Encounter (Signed)
Pt can have her CBC drawn anytime for her surgeon to have, the order for this have been in the computer.  However, I still need her to wait on the iron studies, as checking too soon after transfusions (blood and iron) will make it inaccurate. I attempted to contact the patient today since there seems to be some confusion on the matter, and did not receive and answer. I have encourage the patient to send a mychart message if she has questions.

## 2015-05-08 NOTE — Telephone Encounter (Signed)
LVM for pt to call back as soon as possible.   RE: left detailed message with pt to reschedule the lab appt. Pt also needs to schedule a follow up with PCP after the 15th per PCP request.

## 2015-05-08 NOTE — Telephone Encounter (Signed)
Prior phone note: - I have placed future labs that I would like collected after September 15. Patient can have her labs drawn a few days prior to an appointment I need her to schedule with me, to follow-up with her. At that appointment we will review her labs and set up infusion appointments if necessary. She should be encouraged to follow-up with her gynecologist (if she has not already), since her anemia is likely coming from her menorrhagia. Thanks.

## 2015-05-08 NOTE — Telephone Encounter (Signed)
Explained to pt the reason that PCP did not want to have the labs drawn again. CBC and be done but the IBC will not come back different due to the time laps.  Pt was upset and not agreeable to the reasoning and stated that if PCP did not want to follow up on the iron then she would find a new PCP.

## 2015-05-12 ENCOUNTER — Other Ambulatory Visit: Payer: BLUE CROSS/BLUE SHIELD

## 2015-05-22 ENCOUNTER — Other Ambulatory Visit: Payer: Self-pay | Admitting: *Deleted

## 2015-05-22 DIAGNOSIS — D519 Vitamin B12 deficiency anemia, unspecified: Secondary | ICD-10-CM

## 2015-05-22 MED ORDER — CYANOCOBALAMIN 1000 MCG/ML IJ SOLN: 1000.0000 ug | INTRAMUSCULAR | Status: DC

## 2015-05-22 NOTE — Telephone Encounter (Signed)
Pt called requesting refill for B12  LOV: 04/24/15  RF request for B12 Last written: 11/18/14 83m w/ 3RF  Please advise. Thanks.

## 2015-05-22 NOTE — Telephone Encounter (Signed)
Patient needs Rx for B12 sent to [facility]

## 2015-07-07 ENCOUNTER — Other Ambulatory Visit: Payer: BLUE CROSS/BLUE SHIELD

## 2015-07-07 DIAGNOSIS — Z9884 Bariatric surgery status: Secondary | ICD-10-CM

## 2015-07-07 NOTE — Progress Notes (Signed)
Lab work for Dr Greer Pickerel Folate, zinc, vitamin a, copper, selenium, vitamin b1 (thiamine), calcitriol vitamin 1,25, cmp Z98.84

## 2015-07-11 ENCOUNTER — Encounter: Payer: Self-pay | Admitting: Nurse Practitioner

## 2015-07-11 ENCOUNTER — Ambulatory Visit (INDEPENDENT_AMBULATORY_CARE_PROVIDER_SITE_OTHER): Payer: BLUE CROSS/BLUE SHIELD | Admitting: Nurse Practitioner

## 2015-07-11 VITALS — BP 134/92 | HR 76 | Temp 97.0°F | Ht 64.0 in | Wt 159.0 lb

## 2015-07-11 DIAGNOSIS — D500 Iron deficiency anemia secondary to blood loss (chronic): Secondary | ICD-10-CM | POA: Diagnosis not present

## 2015-07-11 DIAGNOSIS — N921 Excessive and frequent menstruation with irregular cycle: Secondary | ICD-10-CM | POA: Diagnosis not present

## 2015-07-11 DIAGNOSIS — D519 Vitamin B12 deficiency anemia, unspecified: Secondary | ICD-10-CM | POA: Diagnosis not present

## 2015-07-11 LAB — FOLATE: Folate: 19.3 ng/mL (ref >3.0)

## 2015-07-11 LAB — COMPREHENSIVE METABOLIC PANEL
A/G RATIO: 2.3 (ref 1.1–2.5)
ALK PHOS: 79 IU/L (ref 39–117)
ALT: 13 IU/L (ref 0–32)
AST: 14 IU/L (ref 0–40)
Albumin: 4.3 g/dL (ref 3.5–5.5)
BILIRUBIN TOTAL: 0.4 mg/dL (ref 0.0–1.2)
BUN / CREAT RATIO: 11 (ref 9–23)
BUN: 8 mg/dL (ref 6–24)
CALCIUM: 9.1 mg/dL (ref 8.7–10.2)
CHLORIDE: 103 mmol/L (ref 97–106)
CO2: 24 mmol/L (ref 18–29)
Creatinine, Ser: 0.75 mg/dL (ref 0.57–1.00)
GFR calc non Af Amer: 99 mL/min/{1.73_m2} (ref >59)
GFR, EST AFRICAN AMERICAN: 114 mL/min/{1.73_m2} (ref >59)
GLOBULIN, TOTAL: 1.9 g/dL (ref 1.5–4.5)
Glucose: 88 mg/dL (ref 65–99)
POTASSIUM: 4.8 mmol/L (ref 3.5–5.2)
SODIUM: 143 mmol/L (ref 136–144)
TOTAL PROTEIN: 6.2 g/dL (ref 6.0–8.5)

## 2015-07-11 LAB — ZINC: ZINC: 103 ug/dL (ref 56–134)

## 2015-07-11 LAB — POCT HEMOGLOBIN: Hemoglobin: 9.7 g/dL — AB (ref 12.2–16.2)

## 2015-07-11 LAB — COPPER, SERUM: Copper: 92 ug/dL (ref 72–166)

## 2015-07-11 LAB — SELENIUM SERUM: Selenium, S/P: 115 ug/L (ref 79–326)

## 2015-07-11 LAB — CALCITRIOL (1,25 DI-OH VIT D): VIT D 1 25 DIHYDROXY: 80.4 pg/mL — AB (ref 19.9–79.3)

## 2015-07-11 LAB — VITAMIN A: VITAMIN A: 27 ug/dL (ref 20–65)

## 2015-07-11 LAB — VITAMIN B1: Thiamine: 120.4 nmol/L (ref 66.5–200.0)

## 2015-07-11 MED ORDER — CYANOCOBALAMIN 1000 MCG/ML IJ SOLN: 1000.0000 ug | INTRAMUSCULAR | Status: DC

## 2015-07-11 NOTE — Addendum Note (Signed)
Addended by: Chevis Pretty on: 07/11/2015 11:32 AM   Modules accepted: Orders

## 2015-07-11 NOTE — Progress Notes (Signed)
   Subjective:    Patient ID: Theresa [name], female    DOB: [birth year], 44 y.o.   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 She had labs done this past Friday. She has been having heavy and dysfunctional uterine bleeding- she is scheduled for D&C this Thursday. Her last HGB was 9.2 at GYN office.  * she had to have 3 units of bkood and iron in August and hgb is dropping again- she has been enrolled in a iron infusion study that is suppose to start next week. She is currently on iron but she stopped it last Friday in order to start the study next week. SHe is suppose to continue her b12 injections.  Review of Systems  Constitutional: Negative.   HENT: Negative.   Respiratory: Negative.   Cardiovascular: Negative.   Genitourinary: Negative.   Neurological: Negative.   Psychiatric/Behavioral: Negative.   All other systems reviewed and are negative.      Objective:   Physical Exam  Constitutional: She is oriented to person, place, and time. She appears well-developed and well-nourished. No distress.  Cardiovascular: Normal rate, regular rhythm and normal heart sounds.   Pulmonary/Chest: Effort normal and breath sounds normal.  Neurological: She is alert and oriented to person, place, and time.  Skin: Skin is warm.  Psychiatric: She has a normal mood and affect. Her behavior is normal. Judgment and thought content normal.   BP 134/92 mmHg  Pulse 76  Temp(Src) 97 F (36.1 C) (Oral)  Ht 5\' 4"  (1.626 m)  Wt 159 lb (72.122 kg)  BMI 27.28 kg/m2   Results for orders placed or performed in visit on 07/11/15  POCT hemoglobin  Result Value Ref Range   Hemoglobin 9.7 (A) 12.2 - 16.2 g/dL       Assessment & Plan:  1. Iron deficiency anemia due to chronic blood loss Hold out till study starts - Anemia Profile B - POCT hemoglobin  2. Menorrhagia with irregular  cycle Keep appointment for d&c   Follow up prn  Mary-Margaret Hassell Done, FNP

## 2015-07-11 NOTE — Patient Instructions (Signed)
Iron Deficiency Anemia, Adult Anemia is a condition in which there are less red blood cells or hemoglobin in the blood than normal. Hemoglobin is the part of red blood cells that carries oxygen. Iron deficiency anemia is anemia caused by too little iron. It is the most common type of anemia. It may leave you tired and short of breath. CAUSES   Lack of iron in the diet.  Poor absorption of iron, as seen with intestinal disorders.  Intestinal bleeding.  Heavy periods. SIGNS AND SYMPTOMS  Mild anemia may not be noticeable. Symptoms may include:  Fatigue.  Headache.  Pale skin.  Weakness.  Tiredness.  Shortness of breath.  Dizziness.  Cold hands and feet.  Fast or irregular heartbeat. DIAGNOSIS  Diagnosis requires a thorough evaluation and physical exam by your health care provider. Blood tests are generally used to confirm iron deficiency anemia. Additional tests may be done to find the underlying cause of your anemia. These may include:  Testing for blood in the stool (fecal occult blood test).  A procedure to see inside the colon and rectum (colonoscopy).  A procedure to see inside the esophagus and stomach (endoscopy). TREATMENT  Iron deficiency anemia is treated by correcting the cause of the deficiency. Treatment may involve:  Adding iron-rich foods to your diet.  Taking iron supplements. Pregnant or breastfeeding women need to take extra iron because their normal diet usually does not provide the required amount.  Taking vitamins. Vitamin C improves the absorption of iron. Your health care provider may recommend that you take your iron tablets with a glass of orange juice or vitamin C supplement.  Medicines to make heavy menstrual flow lighter.  Surgery. HOME CARE INSTRUCTIONS   Take iron as directed by your health care provider.  If you cannot tolerate taking iron supplements by mouth, talk to your health care provider about taking them through a vein  (intravenously) or an injection into a muscle.  For the best iron absorption, iron supplements should be taken on an empty stomach. If you cannot tolerate them on an empty stomach, you may need to take them with food.  Do not drink milk or take antacids at the same time as your iron supplements. Milk and antacids may interfere with the absorption of iron.  Iron supplements can cause constipation. Make sure to include fiber in your diet to prevent constipation. A stool softener may also be recommended.  Take vitamins as directed by your health care provider.  Eat a diet rich in iron. Foods high in iron include liver, lean beef, whole-grain bread, eggs, dried fruit, and dark green leafy vegetables. SEEK IMMEDIATE MEDICAL CARE IF:   You faint. If this happens, do not drive. Call your local emergency services (911 in [place] if no other help is available.  You have chest pain.  You feel nauseous or vomit.  You have severe or increased shortness of breath with activity.  You feel weak.  You have a rapid heartbeat.  You have unexplained sweating.  You become light-headed when getting up from a chair or bed. MAKE SURE YOU:   Understand these instructions.  Will watch your condition.  Will get help right away if you are not doing well or get worse.   This information is not intended to replace advice given to you by your health care provider. Make sure you discuss any questions you have with your health care provider.   Document Released: 08/23/2000 Document Revised: 09/16/2014 Document Reviewed: 05/03/2013 Elsevier   Interactive Patient Education 2016 Elsevier Inc.  

## 2015-07-12 LAB — ANEMIA PROFILE B
Basophils Absolute: 0 10*3/uL (ref 0.0–0.2)
Basos: 0 %
EOS (ABSOLUTE): 0.2 10*3/uL (ref 0.0–0.4)
EOS: 6 %
Ferritin: 8 ng/mL — ABNORMAL LOW (ref 15–150)
Folate: 13.9 ng/mL (ref >3.0)
HEMOGLOBIN: 9.8 g/dL — AB (ref 11.1–15.9)
Hematocrit: 30.7 % — ABNORMAL LOW (ref 34.0–46.6)
IMMATURE GRANS (ABS): 0 10*3/uL (ref 0.0–0.1)
IMMATURE GRANULOCYTES: 0 %
IRON SATURATION: 4 % — AB (ref 15–55)
Iron: 15 ug/dL — ABNORMAL LOW (ref 27–159)
LYMPHS: 41 %
Lymphocytes Absolute: 1.8 10*3/uL (ref 0.7–3.1)
MCH: 25.1 pg — ABNORMAL LOW (ref 26.6–33.0)
MCHC: 31.9 g/dL (ref 31.5–35.7)
MCV: 79 fL (ref 79–97)
MONOS ABS: 0.4 10*3/uL (ref 0.1–0.9)
Monocytes: 9 %
NEUTROS PCT: 44 %
Neutrophils Absolute: 1.9 10*3/uL (ref 1.4–7.0)
Platelets: 281 10*3/uL (ref 150–379)
RBC: 3.91 x10E6/uL (ref 3.77–5.28)
RDW: 17.1 % — ABNORMAL HIGH (ref 12.3–15.4)
Retic Ct Pct: 1 % (ref 0.6–2.6)
TIBC: 384 ug/dL (ref 250–450)
UIBC: 369 ug/dL (ref 131–425)
Vitamin B-12: 534 pg/mL (ref 211–946)
WBC: 4.4 10*3/uL (ref 3.4–10.8)

## 2015-08-23 NOTE — Progress Notes (Signed)
Theresa [name]  DICTATION # [id number] CSN# [id number]   Margarette Asal, MD 08/23/2015 12:32 PM

## 2015-08-24 ENCOUNTER — Encounter (HOSPITAL_COMMUNITY): Payer: Self-pay

## 2015-08-24 ENCOUNTER — Encounter (HOSPITAL_COMMUNITY)
Admission: RE | Admit: 2015-08-24 | Discharge: 2015-08-24 | Disposition: A | Discharge status: Home / Self Care | Payer: BLUE CROSS/BLUE SHIELD | Source: Ambulatory Visit | Attending: Obstetrics and Gynecology | Admitting: Obstetrics and Gynecology

## 2015-08-24 DIAGNOSIS — N800 Endometriosis of uterus: Secondary | ICD-10-CM | POA: Diagnosis not present

## 2015-08-24 DIAGNOSIS — N946 Dysmenorrhea, unspecified: Secondary | ICD-10-CM | POA: Insufficient documentation

## 2015-08-24 DIAGNOSIS — N920 Excessive and frequent menstruation with regular cycle: Secondary | ICD-10-CM | POA: Insufficient documentation

## 2015-08-24 DIAGNOSIS — Z01812 Encounter for preprocedural laboratory examination: Secondary | ICD-10-CM | POA: Diagnosis present

## 2015-08-24 HISTORY — DX: Anemia, unspecified: D64.9

## 2015-08-24 LAB — CBC
HCT: 37.4 % (ref 36.0–46.0)
Hemoglobin: 12.1 g/dL (ref 12.0–15.0)
MCH: 27.4 pg (ref 26.0–34.0)
MCHC: 32.4 g/dL (ref 30.0–36.0)
MCV: 84.8 fL (ref 78.0–100.0)
PLATELETS: 174 10*3/uL (ref 150–400)
RBC: 4.41 MIL/uL (ref 3.87–5.11)
RDW: 17.2 % — AB (ref 11.5–15.5)
WBC: 5.5 10*3/uL (ref 4.0–10.5)

## 2015-08-24 NOTE — H&P (Signed)
NAMEPEBBLES, ZEIDERS              ACCOUNT NO.:  [id number]  MEDICAL RECORD NO.:  [id number]  LOCATION:  [facility]                         FACILITY:  [facility]  PHYSICIAN:  Ralene Bathe. Matthew Saras, M.D.DATE OF BIRTH:  09-22-1970  DATE OF ADMISSION:  09/05/2015 DATE OF DISCHARGE:                             HISTORY & PHYSICAL   CHIEF COMPLAINT:  Symptomatic leiomyoma, pelvic pain, history of anemia, and menorrhagia.  HPI:  A 44 year old, G4, P4, prior tubal seen originally on March 16th, referred by her PCP for evaluation of menorrhagia.  Hemoglobin was in the 9 range at that time.  She has had a prior tubal ligation, one prior C-section in 1992, 3 vaginal deliveries and had a gastric bypass in 2015.  Additionally, she abdominoplasty in the interim.  Evaluation of sonohysterogram in the office September 16th, demonstrated possible adenomyosis, otherwise normal uterus.  Cavity looked normal at that point.  She initially elected ablation which was performed in the office in November 2016.  The D and C specimen was benign.  Due to problems with continued pain, she presents at this time for [facility].  This procedure including specific risks related to bleeding, infection, transfusion, adjacent organ injury, the possible need to complete the surgery by open technique.  Other risks related to wound infection, phlebitis, and her expected recovery time reviewed which she understands and accepts.  PAST MEDICAL HISTORY:  Allergies:  None.  CURRENT MEDICATIONS:  Motrin p.r.n., Percocet p.r.n. dysmenorrhea.  SURGICAL HISTORY:  She has had a vaginal delivery x3, gastric bypass 2015, one cesarean section, tubal ligation 1998, endometrial ablation, abdominoplasty.  REVIEW OF SYSTEMS:  Significant for history of anemia.  FAMILY HISTORY:  Significant for headache, heart disease, asthma, thyroid disease, arthritis, diabetes, hypertension, colon and breast cancer.  SOCIAL HISTORY:  Denies drug or tobacco use.   She does drink socially. She is married.  Keith Rake, [place] is her PCP.  Last Pap March 16, was normal.  PHYSICAL EXAM:  VITAL SIGNS:  Temp 98.2, blood pressure 120/78. HEENT:  Unremarkable. NECK:  Supple without masses. LUNGS:  Clear. CARDIOVASCULAR:  Regular rate and rhythm without murmurs, rubs, gallops. BREASTS:  Without masses. ABDOMEN:  Soft, flat, nontender.  She has a well-healed abdominoplasty scar.  Vulva, vagina, cervix normal.  Uterus upper limit normal size, mobile.  Adnexa negative. EXTREMITIES:  Unremarkable. NEUROLOGIC:  Unremarkable.  IMPRESSION:  Probable adenomyosis, dysmenorrhea with menorrhagia, status post ablation.  PLAN:  TVH procedure and risks discussed as above.     Richard M. Matthew Saras, M.D.     RMH/MEDQ  D:  08/23/2015  T:  08/24/2015  Job:  [id number]

## 2015-08-24 NOTE — Patient Instructions (Signed)
Your procedure is scheduled on:09/05/15  Enter through the Main Entrance at :[place] up desk phone and dial [phone] and inform us of your arrival.  Please call [phone] if you have any problems the morning of surgery.  Remember: Do not eat food or drink liquids, including water, after midnight:Monday    You may brush your teeth the morning of surgery.    DO NOT wear jewelry, eye make-up, lipstick,body lotion, or dark fingernail polish.  (Polished toes are ok) You may wear deodorant.  If you are to be admitted after surgery, leave suitcase in car until your room has been assigned. Patients discharged on the day of surgery will not be allowed to drive home. Wear loose fitting, comfortable clothes for your ride home.

## 2015-08-24 NOTE — Pre-Procedure Instructions (Signed)
Patient's PAT appt was scheduled for 2:15pm - she had registered and was ready to be seen at 2:25. I was with the 1:15 pm PAT patient until 2:40. When I went to lobby to retrieve Ms. Theresa [name], she was agitated and angry because she had to wait for me. She expressed concern that she would miss her doctor appt at 3:10pm. I offered to call Dr. Delanna Ahmadi office and let them know she would be late but pt refused and started to leave hospital. I explained that she had to have PAT appt and offered to reschedule but again she refused. I encouraged her to let me interview her and draw her labs until 3:00 pm at which point she could leave and arrive at Dr. Delanna Ahmadi office on time. During my brief time with her, she continually told me to "hurry up". I wanted her to calm down before I took her vital signs,however, in delaying getting those, and in a hurry to acquire her labs and health info, I did not obtain her BP, etc. I was able to give her preop inst and have her sign the consent form. She left [facility] at 3:00 pm.

## 2015-08-24 NOTE — Pre-Procedure Instructions (Addendum)
Patient's PAT appt was scheduled for 2:15pm - she had registered and was ready to be seen at 2:25. I was with the 1:15 pm PAT patient until 2:40. When I went to lobby to retrieve Theresa [name], she was agitated and angry because she had to wait for me. She expressed concern that she would miss her doctor appt at 3:10pm. I offered to call Dr. Delanna Ahmadi office and let them know she would be late but pt refused and started to leave hospital. I explained that she had to have PAT appt and offered to reschedule but again she refused. I encouraged her to let me interview her and draw her labs until 3:00 pm at which point she could leave and arrive at Dr. Delanna Ahmadi office on time. During my brief time with her, she continually told me to "hurry up". I wanted her to calm down before I took her vital signs,however, in delaying getting those, and in a hurry to acquire her labs and health info, I did not obtain her BP, etc. I was able to give her preop inst and have her sign the consent form. She left [facility] at 3:00 pm.

## 2015-09-01 NOTE — Anesthesia Preprocedure Evaluation (Addendum)
Anesthesia Evaluation  Patient identified by MRN, date of birth, ID band Patient awake    Reviewed: Allergy & Precautions, NPO status , Patient's Chart, lab work & pertinent test results  Airway Mallampati: II       Dental  (+) Teeth Intact, Dental Advisory Given   Pulmonary neg pulmonary ROS,    breath sounds clear to auscultation       Cardiovascular hypertension, negative cardio ROS   Rhythm:Regular     Neuro/Psych Bipolar Disorder ADDnegative neurological ROS  negative psych ROS   GI/Hepatic negative GI ROS, Neg liver ROS, GERD  ,SP GastricBytpass 2015     Endo/Other  negative endocrine ROS  Renal/GU negative Renal ROS  negative genitourinary   Musculoskeletal negative musculoskeletal ROS (+)   Abdominal (+) + obese,   Peds negative pediatric ROS (+)  Hematology negative hematology ROS (+) 12/37   Anesthesia Other Findings   Reproductive/Obstetrics negative OB ROS                            Anesthesia Physical Anesthesia Plan  ASA: II  Anesthesia Plan: General   Post-op Pain Management:    Induction: Intravenous  Airway Management Planned: Oral ETT  Additional Equipment:   Intra-op Plan:   Post-operative Plan: Extubation in OR  Informed Consent: I have reviewed the patients History and Physical, chart, labs and discussed the procedure including the risks, benefits and alternatives for the proposed anesthesia with the patient or authorized representative who has indicated his/her understanding and acceptance.     Plan Discussed with:   Anesthesia Plan Comments:         Anesthesia Quick Evaluation

## 2015-09-04 MED ORDER — DEXTROSE 5 % IV SOLN
2.0000 g | INTRAVENOUS | Status: AC
  Administered 2015-09-05: 2 g via INTRAVENOUS
  Filled 2015-09-04: qty 2

## 2015-09-05 ENCOUNTER — Ambulatory Visit (HOSPITAL_COMMUNITY): Payer: BLUE CROSS/BLUE SHIELD | Admitting: Anesthesiology

## 2015-09-05 ENCOUNTER — Encounter (HOSPITAL_COMMUNITY): Payer: Self-pay | Admitting: *Deleted

## 2015-09-05 ENCOUNTER — Encounter (HOSPITAL_COMMUNITY)
Admission: RE | Disposition: A | Discharge status: Home / Self Care | Payer: Self-pay | Source: Ambulatory Visit | Attending: Obstetrics and Gynecology

## 2015-09-05 ENCOUNTER — Observation Stay (HOSPITAL_COMMUNITY)
Admission: RE | Admit: 2015-09-05 | Discharge: 2015-09-06 | Disposition: A | Discharge status: Home / Self Care | Payer: BLUE CROSS/BLUE SHIELD | Source: Ambulatory Visit | Attending: Obstetrics and Gynecology | Admitting: Obstetrics and Gynecology

## 2015-09-05 DIAGNOSIS — N920 Excessive and frequent menstruation with regular cycle: Secondary | ICD-10-CM | POA: Diagnosis not present

## 2015-09-05 DIAGNOSIS — N8003 Adenomyosis of the uterus: Secondary | ICD-10-CM | POA: Diagnosis present

## 2015-09-05 DIAGNOSIS — I10 Essential (primary) hypertension: Secondary | ICD-10-CM | POA: Insufficient documentation

## 2015-09-05 DIAGNOSIS — N800 Endometriosis of uterus: Secondary | ICD-10-CM | POA: Diagnosis not present

## 2015-09-05 DIAGNOSIS — N946 Dysmenorrhea, unspecified: Secondary | ICD-10-CM | POA: Diagnosis not present

## 2015-09-05 DIAGNOSIS — Z9884 Bariatric surgery status: Secondary | ICD-10-CM | POA: Diagnosis not present

## 2015-09-05 DIAGNOSIS — R102 Pelvic and perineal pain: Secondary | ICD-10-CM | POA: Diagnosis not present

## 2015-09-05 DIAGNOSIS — N809 Endometriosis, unspecified: Secondary | ICD-10-CM

## 2015-09-05 HISTORY — PX: UNILATERAL SALPINGECTOMY: SHX6160

## 2015-09-05 HISTORY — PX: VAGINAL HYSTERECTOMY: SHX2639

## 2015-09-05 LAB — CBC
HCT: 32.7 % — ABNORMAL LOW (ref 36.0–46.0)
Hemoglobin: 10.7 g/dL — ABNORMAL LOW (ref 12.0–15.0)
MCH: 28.2 pg (ref 26.0–34.0)
MCHC: 32.7 g/dL (ref 30.0–36.0)
MCV: 86.3 fL (ref 78.0–100.0)
PLATELETS: 201 10*3/uL (ref 150–400)
RBC: 3.79 MIL/uL — ABNORMAL LOW (ref 3.87–5.11)
RDW: 17 % — AB (ref 11.5–15.5)
WBC: 9.3 10*3/uL (ref 4.0–10.5)

## 2015-09-05 LAB — PREGNANCY, URINE: Preg Test, Ur: NEGATIVE

## 2015-09-05 LAB — GLUCOSE, CAPILLARY: Glucose-Capillary: 138 mg/dL — ABNORMAL HIGH (ref 65–99)

## 2015-09-05 SURGERY — HYSTERECTOMY, VAGINAL
Anesthesia: General | Site: Vagina | Laterality: Right

## 2015-09-05 MED ORDER — ONDANSETRON HCL 4 MG/2ML IJ SOLN: 4.0000 mg | Freq: Four times a day (QID) | INTRAMUSCULAR | Status: DC | PRN

## 2015-09-05 MED ORDER — ONDANSETRON HCL 4 MG/2ML IJ SOLN
INTRAMUSCULAR | Status: DC | PRN
  Administered 2015-09-05: 4 mg via INTRAVENOUS

## 2015-09-05 MED ORDER — FENTANYL CITRATE (PF) 100 MCG/2ML IJ SOLN
INTRAMUSCULAR | Status: DC | PRN
  Administered 2015-09-05: 50 ug via INTRAVENOUS
  Administered 2015-09-05: 150 ug via INTRAVENOUS
  Administered 2015-09-05: 50 ug via INTRAVENOUS
  Administered 2015-09-05 (×2): 100 ug via INTRAVENOUS

## 2015-09-05 MED ORDER — DEXTROSE IN LACTATED RINGERS 5 % IV SOLN
INTRAVENOUS | Status: DC
  Administered 2015-09-05 – 2015-09-06 (×3): via INTRAVENOUS

## 2015-09-05 MED ORDER — MIDAZOLAM HCL 2 MG/2ML IJ SOLN
INTRAMUSCULAR | Status: AC
  Filled 2015-09-05: qty 2

## 2015-09-05 MED ORDER — DIPHENHYDRAMINE HCL 50 MG/ML IJ SOLN: 12.5000 mg | Freq: Four times a day (QID) | INTRAMUSCULAR | Status: DC | PRN

## 2015-09-05 MED ORDER — 0.9 % SODIUM CHLORIDE (POUR BTL) OPTIME
TOPICAL | Status: DC | PRN
  Administered 2015-09-05: 1000 mL

## 2015-09-05 MED ORDER — OXYCODONE-ACETAMINOPHEN 5-325 MG PO TABS
1.0000 | ORAL_TABLET | ORAL | Status: DC | PRN
  Administered 2015-09-05 – 2015-09-06 (×2): 1 via ORAL
  Filled 2015-09-05 (×2): qty 1

## 2015-09-05 MED ORDER — MORPHINE SULFATE (PF) 10 MG/ML IV SOLN
INTRAVENOUS | Status: AC
  Filled 2015-09-05: qty 1

## 2015-09-05 MED ORDER — MORPHINE SULFATE (PF) 4 MG/ML IV SOLN
INTRAVENOUS | Status: AC
  Administered 2015-09-05: 2 mg via INTRAVENOUS
  Filled 2015-09-05: qty 1

## 2015-09-05 MED ORDER — KETOROLAC TROMETHAMINE 30 MG/ML IJ SOLN
INTRAMUSCULAR | Status: AC
  Filled 2015-09-05: qty 1

## 2015-09-05 MED ORDER — GLYCOPYRROLATE 0.2 MG/ML IJ SOLN
INTRAMUSCULAR | Status: DC | PRN
  Administered 2015-09-05: .8 mg via INTRAVENOUS

## 2015-09-05 MED ORDER — PROPOFOL 10 MG/ML IV BOLUS
INTRAVENOUS | Status: DC | PRN
  Administered 2015-09-05: 200 mg via INTRAVENOUS

## 2015-09-05 MED ORDER — ROCURONIUM BROMIDE 100 MG/10ML IV SOLN
INTRAVENOUS | Status: DC | PRN
  Administered 2015-09-05: 50 mg via INTRAVENOUS
  Administered 2015-09-05: 10 mg via INTRAVENOUS

## 2015-09-05 MED ORDER — SODIUM CHLORIDE 0.9 % IJ SOLN
9.0000 mL | INTRAMUSCULAR | Status: DC | PRN
Start: 2015-09-05 — End: 2015-09-06

## 2015-09-05 MED ORDER — HYDROMORPHONE HCL 1 MG/ML IJ SOLN
0.2500 mg | INTRAMUSCULAR | Status: DC | PRN
  Administered 2015-09-05 (×3): 0.5 mg via INTRAVENOUS

## 2015-09-05 MED ORDER — HYDROMORPHONE HCL 1 MG/ML IJ SOLN: 1.0000 mg | Freq: Once | INTRAMUSCULAR | Status: DC

## 2015-09-05 MED ORDER — MENTHOL 3 MG MT LOZG: 1.0000 | LOZENGE | OROMUCOSAL | Status: DC | PRN

## 2015-09-05 MED ORDER — KETOROLAC TROMETHAMINE 30 MG/ML IJ SOLN: 30.0000 mg | Freq: Four times a day (QID) | INTRAMUSCULAR | Status: DC

## 2015-09-05 MED ORDER — KETOROLAC TROMETHAMINE 30 MG/ML IJ SOLN: 30.0000 mg | Freq: Once | INTRAMUSCULAR | Status: DC

## 2015-09-05 MED ORDER — FENTANYL CITRATE (PF) 250 MCG/5ML IJ SOLN
INTRAMUSCULAR | Status: AC
  Filled 2015-09-05: qty 5

## 2015-09-05 MED ORDER — DEXAMETHASONE SODIUM PHOSPHATE 10 MG/ML IJ SOLN
INTRAMUSCULAR | Status: DC | PRN
  Administered 2015-09-05: 10 mg via INTRAVENOUS

## 2015-09-05 MED ORDER — LIDOCAINE HCL (CARDIAC) 20 MG/ML IV SOLN
INTRAVENOUS | Status: DC | PRN
  Administered 2015-09-05: 100 mg via INTRAVENOUS

## 2015-09-05 MED ORDER — KETOROLAC TROMETHAMINE 30 MG/ML IJ SOLN
INTRAMUSCULAR | Status: DC | PRN
  Administered 2015-09-05: 30 mg via INTRAVENOUS

## 2015-09-05 MED ORDER — FENTANYL CITRATE (PF) 100 MCG/2ML IJ SOLN
25.0000 ug | INTRAMUSCULAR | Status: DC | PRN
  Administered 2015-09-05 (×2): 50 ug via INTRAVENOUS

## 2015-09-05 MED ORDER — MIDAZOLAM HCL 2 MG/2ML IJ SOLN
1.0000 mg | Freq: Once | INTRAMUSCULAR | Status: AC
  Administered 2015-09-05: 1 mg via INTRAVENOUS

## 2015-09-05 MED ORDER — FENTANYL CITRATE (PF) 100 MCG/2ML IJ SOLN
INTRAMUSCULAR | Status: AC
  Filled 2015-09-05: qty 2

## 2015-09-05 MED ORDER — ONDANSETRON HCL 4 MG PO TABS
4.0000 mg | ORAL_TABLET | Freq: Four times a day (QID) | ORAL | Status: DC | PRN
Start: 2015-09-05 — End: 2015-09-06

## 2015-09-05 MED ORDER — BUTORPHANOL TARTRATE 1 MG/ML IJ SOLN: 1.0000 mg | INTRAMUSCULAR | Status: DC | PRN

## 2015-09-05 MED ORDER — PROPOFOL 10 MG/ML IV BOLUS
INTRAVENOUS | Status: AC
  Filled 2015-09-05: qty 20

## 2015-09-05 MED ORDER — MEPERIDINE HCL 25 MG/ML IJ SOLN
INTRAMUSCULAR | Status: AC
Start: 2015-09-05 — End: 2015-09-05
  Administered 2015-09-05: 6.25 mg via INTRAVENOUS
  Filled 2015-09-05: qty 1

## 2015-09-05 MED ORDER — MIDAZOLAM HCL 2 MG/2ML IJ SOLN
INTRAMUSCULAR | Status: AC
  Administered 2015-09-05: 1 mg via INTRAVENOUS
  Filled 2015-09-05: qty 2

## 2015-09-05 MED ORDER — SCOPOLAMINE 1 MG/3DAYS TD PT72
MEDICATED_PATCH | TRANSDERMAL | Status: AC
  Administered 2015-09-05: 1.5 mg via TRANSDERMAL
  Filled 2015-09-05: qty 1

## 2015-09-05 MED ORDER — DEXAMETHASONE SODIUM PHOSPHATE 10 MG/ML IJ SOLN
INTRAMUSCULAR | Status: AC
  Filled 2015-09-05: qty 1

## 2015-09-05 MED ORDER — HYDROMORPHONE HCL 1 MG/ML IJ SOLN
INTRAMUSCULAR | Status: AC
  Filled 2015-09-05: qty 1

## 2015-09-05 MED ORDER — PROMETHAZINE HCL 25 MG/ML IJ SOLN
6.2500 mg | INTRAMUSCULAR | Status: DC | PRN
  Administered 2015-09-05: 6.25 mg via INTRAVENOUS

## 2015-09-05 MED ORDER — HYDROMORPHONE HCL 1 MG/ML IJ SOLN
INTRAMUSCULAR | Status: AC
  Administered 2015-09-05: 0.5 mg via INTRAVENOUS
  Filled 2015-09-05: qty 1

## 2015-09-05 MED ORDER — IBUPROFEN 800 MG PO TABS: 800.0000 mg | ORAL_TABLET | Freq: Three times a day (TID) | ORAL | Status: DC | PRN

## 2015-09-05 MED ORDER — ROCURONIUM BROMIDE 100 MG/10ML IV SOLN
INTRAVENOUS | Status: AC
  Filled 2015-09-05: qty 1

## 2015-09-05 MED ORDER — MIDAZOLAM HCL 5 MG/5ML IJ SOLN
INTRAMUSCULAR | Status: DC | PRN
  Administered 2015-09-05: 2 mg via INTRAVENOUS

## 2015-09-05 MED ORDER — GLYCOPYRROLATE 0.2 MG/ML IJ SOLN
INTRAMUSCULAR | Status: AC
  Filled 2015-09-05: qty 4

## 2015-09-05 MED ORDER — SCOPOLAMINE 1 MG/3DAYS TD PT72
1.0000 | MEDICATED_PATCH | Freq: Once | TRANSDERMAL | Status: DC
  Administered 2015-09-05: 1.5 mg via TRANSDERMAL

## 2015-09-05 MED ORDER — HYDROXYZINE HCL 25 MG PO TABS
25.0000 mg | ORAL_TABLET | Freq: Three times a day (TID) | ORAL | Status: DC | PRN
  Administered 2015-09-05 – 2015-09-06 (×3): 25 mg via ORAL
  Filled 2015-09-05 (×6): qty 1

## 2015-09-05 MED ORDER — PROMETHAZINE HCL 25 MG/ML IJ SOLN
INTRAMUSCULAR | Status: AC
  Administered 2015-09-05: 6.25 mg via INTRAVENOUS
  Filled 2015-09-05: qty 1

## 2015-09-05 MED ORDER — LIDOCAINE HCL (CARDIAC) 20 MG/ML IV SOLN
INTRAVENOUS | Status: AC
  Filled 2015-09-05: qty 5

## 2015-09-05 MED ORDER — NEOSTIGMINE METHYLSULFATE 10 MG/10ML IV SOLN
INTRAVENOUS | Status: DC | PRN
  Administered 2015-09-05: 4 mg via INTRAVENOUS

## 2015-09-05 MED ORDER — ACETAMINOPHEN 10 MG/ML IV SOLN
1000.0000 mg | Freq: Once | INTRAVENOUS | Status: AC
  Administered 2015-09-05: 1000 mg via INTRAVENOUS
  Filled 2015-09-05: qty 100

## 2015-09-05 MED ORDER — MORPHINE SULFATE (PF) 4 MG/ML IV SOLN
1.0000 mg | INTRAVENOUS | Status: DC | PRN
  Administered 2015-09-05 (×4): 2 mg via INTRAVENOUS

## 2015-09-05 MED ORDER — MORPHINE SULFATE 2 MG/ML IV SOLN
INTRAVENOUS | Status: DC
  Administered 2015-09-05 (×2): via INTRAVENOUS
  Administered 2015-09-05: 6 mg via INTRAVENOUS
  Administered 2015-09-05: 36 mg via INTRAVENOUS
  Administered 2015-09-05: 23 mg via INTRAVENOUS
  Administered 2015-09-06: 7 mg via INTRAVENOUS
  Administered 2015-09-06: 9 mg via INTRAVENOUS
  Administered 2015-09-06: 29 mg via INTRAVENOUS
  Filled 2015-09-05 (×3): qty 25

## 2015-09-05 MED ORDER — NALOXONE HCL 0.4 MG/ML IJ SOLN: 0.4000 mg | INTRAMUSCULAR | Status: DC | PRN

## 2015-09-05 MED ORDER — NEOSTIGMINE METHYLSULFATE 10 MG/10ML IV SOLN
INTRAVENOUS | Status: AC
  Filled 2015-09-05: qty 1

## 2015-09-05 MED ORDER — DIPHENHYDRAMINE HCL 12.5 MG/5ML PO ELIX
12.5000 mg | ORAL_SOLUTION | Freq: Four times a day (QID) | ORAL | Status: DC | PRN
  Administered 2015-09-05: 12.5 mg via ORAL
  Filled 2015-09-05: qty 5

## 2015-09-05 MED ORDER — KETOROLAC TROMETHAMINE 30 MG/ML IJ SOLN
30.0000 mg | Freq: Four times a day (QID) | INTRAMUSCULAR | Status: DC
  Administered 2015-09-05 – 2015-09-06 (×3): 30 mg via INTRAVENOUS
  Filled 2015-09-05 (×3): qty 1

## 2015-09-05 MED ORDER — MEPERIDINE HCL 25 MG/ML IJ SOLN
6.2500 mg | INTRAMUSCULAR | Status: DC | PRN
  Administered 2015-09-05 (×2): 6.25 mg via INTRAVENOUS

## 2015-09-05 MED ORDER — LACTATED RINGERS IV SOLN
INTRAVENOUS | Status: DC
  Administered 2015-09-05 (×3): via INTRAVENOUS

## 2015-09-05 MED ORDER — ONDANSETRON HCL 4 MG/2ML IJ SOLN
INTRAMUSCULAR | Status: AC
  Filled 2015-09-05: qty 2

## 2015-09-05 SURGICAL SUPPLY — 25 items
CANISTER SUCT 3000ML (MISCELLANEOUS) ×4 IMPLANT
CLOTH BEACON ORANGE TIMEOUT ST (SAFETY) ×4 IMPLANT
CONT PATH 16OZ SNAP LID 3702 (MISCELLANEOUS) ×2 IMPLANT
DECANTER SPIKE VIAL GLASS SM (MISCELLANEOUS) IMPLANT
ELECT LIGASURE SHORT 9 REUSE (ELECTRODE) ×4 IMPLANT
GLOVE BIO SURGEON STRL SZ7 (GLOVE) ×7 IMPLANT
GLOVE BIOGEL PI IND STRL 6.5 (GLOVE) ×3 IMPLANT
GLOVE BIOGEL PI IND STRL 7.0 (GLOVE) ×3 IMPLANT
GLOVE BIOGEL PI INDICATOR 6.5 (GLOVE) ×4
GLOVE BIOGEL PI INDICATOR 7.0 (GLOVE) ×4
GOWN STRL REUS W/TWL LRG LVL3 (GOWN DISPOSABLE) ×16 IMPLANT
NDL SPNL 22GX3.5 QUINCKE BK (NEEDLE) IMPLANT
NEEDLE HYPO 22GX1.5 SAFETY (NEEDLE) IMPLANT
NEEDLE SPNL 22GX3.5 QUINCKE BK (NEEDLE) ×4 IMPLANT
NS IRRIG 1000ML POUR BTL (IV SOLUTION) ×4 IMPLANT
PACK VAGINAL WOMENS (CUSTOM PROCEDURE TRAY) ×4 IMPLANT
PAD OB MATERNITY 4.3X12.25 (PERSONAL CARE ITEMS) ×4 IMPLANT
SUT MON AB 2-0 CT1 36 (SUTURE) ×4 IMPLANT
SUT VIC AB 0 CT1 18XCR BRD8 (SUTURE) ×6 IMPLANT
SUT VIC AB 0 CT1 8-18 (SUTURE) ×12
SUT VIC AB 2-0 CT1 (SUTURE) ×8 IMPLANT
SUT VICRYL 0 TIES 12 18 (SUTURE) ×4 IMPLANT
TOWEL OR 17X24 6PK STRL BLUE (TOWEL DISPOSABLE) ×8 IMPLANT
TRAY FOLEY CATH SILVER 14FR (SET/KITS/TRAYS/PACK) ×4 IMPLANT
WATER STERILE IRR 1000ML POUR (IV SOLUTION) ×2 IMPLANT

## 2015-09-05 NOTE — Anesthesia Postprocedure Evaluation (Signed)
Anesthesia Post Note  Patient: BAYYINAH HARER  Procedure(s) Performed: Procedure(s) (LRB): HYSTERECTOMY VAGINAL (N/A) UNILATERAL SALPINGECTOMY (Right)  Patient location during evaluation: PACU Anesthesia Type: General Level of consciousness: awake and alert Pain management: pain level controlled Vital Signs Assessment: post-procedure vital signs reviewed and stable Respiratory status: spontaneous breathing, nonlabored ventilation, respiratory function stable and patient connected to nasal cannula oxygen Cardiovascular status: blood pressure returned to baseline and stable Postop Assessment: no signs of nausea or vomiting Anesthetic complications: no    Last Vitals:  Filed Vitals:   09/05/15 0900 09/05/15 0910  BP: 113/57   Pulse: 64 66  Temp:    Resp: 17 19    Last Pain:  Filed Vitals:   09/05/15 0912  PainSc: 10-Worst pain ever                 Jimmye Wisnieski

## 2015-09-05 NOTE — Op Note (Signed)
Theresa [name]  DICTATION # [id number] CSN# [id number]   Margarette Asal, MD 09/05/2015 10:15 AM

## 2015-09-05 NOTE — Addendum Note (Signed)
Addendum  created 09/05/15 1412 by Jonna Munro, CRNA   Modules edited: Clinical Notes   Clinical Notes:  File: [id number]

## 2015-09-05 NOTE — H&P (Deleted)
Theresa [name], Theresa [name]              ACCOUNT NO.:  [id number]  MEDICAL RECORD NO.:  [id number]  LOCATION:  5797                          FACILITY:  [facility]  PHYSICIAN:  Ralene Bathe. Matthew Saras, M.D.DATE OF BIRTH:  [birth year]  DATE OF ADMISSION:  09/05/2015 DATE OF DISCHARGE:                             HISTORY & PHYSICAL   PREOPERATIVE DIAGNOSES:  Menorrhagia, dysmenorrhea, adenomyosis, failed endometrial ablation.  POSTOPERATIVE DIAGNOSES:  Menorrhagia, dysmenorrhea, adenomyosis, failed endometrial ablation.  PROCEDURE:  TVH, right salpingectomy.  SURGEON:  Ralene Bathe. Matthew Saras, MD  ASSISTANT:  Darlyn Chamber, MD  ANESTHESIA:  General.  DRAINS:  Foley catheter.  BLOOD LOSS:  150.  SPECIMENS REMOVED:  Uterus right tube.  PROCEDURE AND FINDINGS:  Patient was taken to the operating room, after an adequate level of general anesthesia was obtained with the patient's legs in stirrups.  The perineum and vagina prepped and draped in the usual fashion.  Foley catheter was positioned.  Appropriate time-outs were taken.  EUA was carried out.  The uterus was at upper limit of normal size mid to posterior mobile, adnexa negative.  Weighted speculum was positioned.  Cervix grasped with tenaculum.  The cervical vaginal mucosa was incised.  The bladder was advanced superiorly until the anterior peritoneal reflection could be identified, this was entered sharply and a retractor was then used to gently elevate the bladder out of the field.  Posteriorly, posterior culdotomy was performed. Uterosacral ligaments were clamped, divided, and suture ligated with 0 Vicryl and held temporarily.  With the uterus on downward traction in sequential manner, the uterine vasculature, pedicle, cardinal ligament, upper broad ligament pedicles were clamped, divided, and suture ligated. Attempted to deliver the fundus of the uterus posteriorly due to the enlarged globular fundus, became easier to morcellate and told  the fundus could be delivered.  Remaining pedicles were clamped, divided, 1st free tied followed by suture ligature of 0 Vicryl.  The left tube and ovary were normal.  The right tube was dilated and seemed to be under some tension, therefore right salpingectomy was carried out.  Some old blood was released during the procedure from within the tube, this was hemostatic.  Prior to closure, sponge, needle, and instrument counts were reported as correct x2.  The vaginal mucosa was then closed right to left with interrupted 2-0 Monocryl sutures with excellent hemostasis.  Clear urine noted at the end the case.  She tolerated this well, went to recovery room in good condition.     Vetra Shinall M. Matthew Saras, M.D.     RMH/MEDQ  D:  09/05/2015  T:  09/05/2015  Job:  [id number]

## 2015-09-05 NOTE — Progress Notes (Signed)
The patient was re-examined with no change in status 

## 2015-09-05 NOTE — Transfer of Care (Signed)
Immediate Anesthesia Transfer of Care Note  Patient: Theresa [name]  Procedure(s) Performed: Procedure(s): HYSTERECTOMY VAGINAL (N/A) UNILATERAL SALPINGECTOMY (Right)  Patient Location: PACU  Anesthesia Type:General  Level of Consciousness: sedated  Airway & Oxygen Therapy: Patient Spontanous Breathing and Patient connected to nasal cannula oxygen  Post-op Assessment: Report given to RN and Post -op Vital signs reviewed and stable  Post vital signs: Reviewed and stable  Last Vitals:  Filed Vitals:   09/05/15 0616  BP: 130/90  Pulse: 68  Temp: 36.6 C  Resp: 18    Complications: No apparent anesthesia complications

## 2015-09-05 NOTE — Progress Notes (Signed)
Patient experiencing increasing pain in lower abdomen and back.  Vitals stable, CBC checked and is reasonable for surgical blood loss and fluid replacement.  She is not overly tender on exam of abdomen or back.  Will continue to observe and will give additional pain meds.  She has already had Fentanyl, Dilaudid, IV Tylenol, Versed, and 1mg  of Versed.  Will try IV morphine also.  She remains alert and has rated her pain score as a 10.

## 2015-09-05 NOTE — Anesthesia Postprocedure Evaluation (Signed)
Anesthesia Post Note  Patient: JAYSON MCNEASE  Procedure(s) Performed: Procedure(s) (LRB): HYSTERECTOMY VAGINAL (N/A) UNILATERAL SALPINGECTOMY (Right)  Patient location during evaluation: [facility] Anesthesia Type: General Level of consciousness: awake and alert Pain management: satisfactory to patient Vital Signs Assessment: post-procedure vital signs reviewed and stable Respiratory status: spontaneous breathing, nonlabored ventilation and patient connected to nasal cannula oxygen Cardiovascular status: stable Postop Assessment: no backache, no headache, no signs of nausea or vomiting and adequate PO intake Anesthetic complications: no    Last Vitals:  Filed Vitals:   09/05/15 1344 09/05/15 1345  BP:  109/63  Pulse:  75  Temp:  37.3 C  Resp: 12 10    Last Pain:  Filed Vitals:   09/05/15 1346  PainSc: 7                  Zekiah Coen

## 2015-09-05 NOTE — Anesthesia Procedure Notes (Signed)
Procedure Name: Intubation Date/Time: 09/05/2015 7:26 AM Performed by: Riki Sheer Pre-anesthesia Checklist: Patient identified, Emergency Drugs available, Suction available, Patient being monitored and Timeout performed Patient Re-evaluated:Patient Re-evaluated prior to inductionOxygen Delivery Method: Circle system utilized Preoxygenation: Pre-oxygenation with 100% oxygen Intubation Type: IV induction Ventilation: Mask ventilation without difficulty Laryngoscope Size: Miller and 2 Grade View: Grade I Tube type: Oral Tube size: 7.0 mm Number of attempts: 1 Airway Equipment and Method: Stylet Placement Confirmation: ETT inserted through vocal cords under direct vision,  positive ETCO2,  CO2 detector and breath sounds checked- equal and bilateral Secured at: 21 cm Tube secured with: Tape Dental Injury: Teeth and Oropharynx as per pre-operative assessment

## 2015-09-06 ENCOUNTER — Encounter (HOSPITAL_COMMUNITY): Payer: Self-pay | Admitting: Obstetrics and Gynecology

## 2015-09-06 DIAGNOSIS — N920 Excessive and frequent menstruation with regular cycle: Secondary | ICD-10-CM | POA: Diagnosis not present

## 2015-09-06 LAB — CBC
HEMATOCRIT: 28.6 % — AB (ref 36.0–46.0)
Hemoglobin: 9.1 g/dL — ABNORMAL LOW (ref 12.0–15.0)
MCH: 27.7 pg (ref 26.0–34.0)
MCHC: 31.8 g/dL (ref 30.0–36.0)
MCV: 86.9 fL (ref 78.0–100.0)
Platelets: 174 10*3/uL (ref 150–400)
RBC: 3.29 MIL/uL — ABNORMAL LOW (ref 3.87–5.11)
RDW: 17.5 % — AB (ref 11.5–15.5)
WBC: 8.1 10*3/uL (ref 4.0–10.5)

## 2015-09-06 MED ORDER — OXYCODONE-ACETAMINOPHEN 5-325 MG PO TABS: 1.0000 | ORAL_TABLET | ORAL | Status: DC | PRN

## 2015-09-06 MED ORDER — IBUPROFEN 800 MG PO TABS: 800.0000 mg | ORAL_TABLET | Freq: Three times a day (TID) | ORAL | Status: DC | PRN

## 2015-09-06 NOTE — Progress Notes (Signed)
Patient discharged home with husband... Discharge instructions reviewed with patient and she verbalized understanding... Condition stable... No equipment... Ambulated to car with Santiago Bur, NT.

## 2015-09-06 NOTE — Discharge Summary (Signed)
Physician Discharge Summary  Patient ID: Theresa [name] MRN: [id number] DOB/AGE: 1971-02-22 44 y.o.  Admit date: 09/05/2015 Discharge date: 09/06/2015  Admission Diagnoses:menorrhagia, dysmenorrhea, adenomyosis  Discharge Diagnoses: same Active Problems:   Adenomyosis   Discharged Condition: good  Hospital Course: adm for TVH>>D/C on POD 1, tol PO, afeb, ambulating  Consults: None  Significant Diagnostic Studies: Results for orders placed or performed during the hospital encounter of 09/05/15 (from the past 24 hour(s))  CBC     Status: Abnormal   Collection Time: 09/05/15 10:20 AM  Result Value Ref Range   WBC 9.3 4.0 - 10.5 K/uL   RBC 3.79 (L) 3.87 - 5.11 MIL/uL   Hemoglobin 10.7 (L) 12.0 - 15.0 g/dL   HCT 32.7 (L) 36.0 - 46.0 %   MCV 86.3 78.0 - 100.0 fL   MCH 28.2 26.0 - 34.0 pg   MCHC 32.7 30.0 - 36.0 g/dL   RDW 17.0 (H) 11.5 - 15.5 %   Platelets 201 150 - 400 K/uL  Glucose, capillary     Status: Abnormal   Collection Time: 09/05/15 12:06 PM  Result Value Ref Range   Glucose-Capillary 138 (H) 65 - 99 mg/dL  CBC     Status: Abnormal   Collection Time: 09/06/15  6:32 AM  Result Value Ref Range   WBC 8.1 4.0 - 10.5 K/uL   RBC 3.29 (L) 3.87 - 5.11 MIL/uL   Hemoglobin 9.1 (L) 12.0 - 15.0 g/dL   HCT 28.6 (L) 36.0 - 46.0 %   MCV 86.9 78.0 - 100.0 fL   MCH 27.7 26.0 - 34.0 pg   MCHC 31.8 30.0 - 36.0 g/dL   RDW 17.5 (H) 11.5 - 15.5 %   Platelets 174 150 - 400 K/uL     Treatments: surgery: TVH  Discharge Exam: Blood pressure 102/57, pulse 57, temperature 98.9 F (37.2 C), temperature source Oral, resp. rate 18, height 5' 5"  (1.651 m), weight 150 lb (68.04 kg), SpO2 100 %. abd soft + BS  Disposition: 01-Home or Self Care     Medication List    STOP taking these medications        acetaminophen 325 MG tablet  Commonly known as:  TYLENOL      TAKE these medications        cyanocobalamin 1000 MCG/ML injection  Commonly known as:  (VITAMIN B-12)   Inject 1 mL (1,000 mcg total) into the muscle every 7 (seven) days. Please give appropriate syringes & needles for drawing & administering.     ferrous sulfate 300 (60 Fe) MG/5ML syrup  Take 300 mg by mouth 3 (three) times daily with meals.     ibuprofen 800 MG tablet  Commonly known as:  ADVIL,MOTRIN  Take 1 tablet (800 mg total) by mouth every 8 (eight) hours as needed for moderate pain (mild pain).     MULTIVITAMIN PO  Take 1 tablet by mouth daily.     oxyCODONE-acetaminophen 5-325 MG tablet  Commonly known as:  PERCOCET/ROXICET  Take 1-2 tablets by mouth every 4 (four) hours as needed for severe pain (moderate to severe pain (when tolerating fluids)).           Follow-up Information    Follow up with Margarette Asal, MD. Schedule an appointment as soon as possible for a visit in 1 week.   Specialty:  Obstetrics and Gynecology   Contact information:   [address] [place] [id number] [phone]  SignedMargarette Asal 09/06/2015, 8:20 AM

## 2015-11-24 ENCOUNTER — Encounter: Payer: Self-pay | Admitting: Family Medicine

## 2015-11-24 ENCOUNTER — Ambulatory Visit (INDEPENDENT_AMBULATORY_CARE_PROVIDER_SITE_OTHER): Payer: BLUE CROSS/BLUE SHIELD | Admitting: Family Medicine

## 2015-11-24 VITALS — BP 151/98 | HR 78 | Temp 98.1°F | Ht 64.0 in | Wt 158.0 lb

## 2015-11-24 DIAGNOSIS — J209 Acute bronchitis, unspecified: Secondary | ICD-10-CM

## 2015-11-24 MED ORDER — AZITHROMYCIN 250 MG PO TABS: ORAL_TABLET | ORAL | Status: DC

## 2015-11-24 MED ORDER — HYDROCODONE-HOMATROPINE 5-1.5 MG/5ML PO SYRP: 5.0000 mL | ORAL_SOLUTION | Freq: Four times a day (QID) | ORAL | Status: DC | PRN

## 2015-11-24 MED ORDER — BETAMETHASONE SOD PHOS & ACET 6 (3-3) MG/ML IJ SUSP
6.0000 mg | Freq: Once | INTRAMUSCULAR | Status: AC
  Administered 2015-11-24: 6 mg via INTRAMUSCULAR

## 2015-11-24 NOTE — Progress Notes (Signed)
Subjective:  Patient ID: Theresa [name], female    DOB: Jul 19, 1971  Age: 45 y.o. MRN: [id number]  CC: Cough   HPI SIRITA DIAMICO presents for nausea vomiting onset 5 days ago.Symptoms have been slowly improved but replaced with cough Fever broke 2 days ago. Severe cough paroxysms are worsening. Patient also has a Sore throat. She is intermittently Dyspneic.    History Venita has a past medical history of Attention deficit disorder; Hyperlipidemia; H/O transfusion; GERD (gastroesophageal reflux disease); Sleep apnea, obstructive; Bipolar disorder ([place]); Anemia; and Hypertension.   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   Her family history includes Cancer in her maternal grandmother and paternal grandmother; Cancer (age of onset: 56) in her mother; Diabetes in her father and mother; Heart disease in her maternal grandmother and paternal grandmother; Hyperlipidemia in her maternal grandfather, maternal grandmother, mother, paternal grandfather, and paternal grandmother; Hypertension in her father, maternal grandfather, maternal grandmother, mother, and paternal grandfather; Stroke in her maternal grandmother; Thyroid disease in her mother.She reports that she has never smoked. She does not have any smokeless tobacco history on file. She reports that she drinks alcohol. She reports that she does not use illicit drugs.    ROS Review of Systems  Constitutional: Positive for fever, chills and appetite change.  HENT: Positive for congestion and rhinorrhea. Negative for ear pain, nosebleeds, postnasal drip, sinus pressure and sore throat.   Respiratory: Negative for chest tightness and shortness of breath.   Cardiovascular: Negative for chest pain.  Musculoskeletal: Positive for myalgias.  Skin: Negative for rash.    Neurological: Positive for headaches.    Objective:  BP 151/98 mmHg  Pulse 78  Temp(Src) 98.1 F (36.7 C) (Oral)  Ht 5\' 4"  (1.626 m)  Wt 158 lb (71.668 kg)  BMI 27.11 kg/m2  BP Readings from Last 3 Encounters:  11/24/15 151/98  09/06/15 97/51  07/11/15 134/92    Wt Readings from Last 3 Encounters:  11/24/15 158 lb (71.668 kg)  09/05/15 150 lb (68.04 kg)  08/24/15 159 lb (72.122 kg)     Physical Exam  Constitutional: She is oriented to person, place, and time. She appears well-developed and well-nourished. No distress.  HENT:  Head: Normocephalic and atraumatic.  Eyes: Conjunctivae are normal. Pupils are equal, round, and reactive to light.  Neck: Normal range of motion. Neck supple. No thyromegaly present.  Cardiovascular: Normal rate, regular rhythm and normal heart sounds.   No murmur heard. Pulmonary/Chest: Effort normal and breath sounds normal. No respiratory distress. She has no wheezes. She has no rales.  Abdominal: Soft. Bowel sounds are normal. She exhibits no distension. There is no tenderness.  Musculoskeletal: Normal range of motion.  Lymphadenopathy:    She has no cervical adenopathy.  Neurological: She is alert and oriented to person, place, and time.  Skin: Skin is warm and dry.  Psychiatric: She has a normal mood and affect. Her behavior is normal. Judgment and thought content normal.     Lab Results  Component Value Date   WBC 8.1 09/06/2015   HGB 9.1* 09/06/2015   HCT 28.6* 09/06/2015   PLT 174 09/06/2015   GLUCOSE 88 07/07/2015   CHOL 162 08/18/2014   TRIG 71 08/18/2014   HDL 47 08/18/2014   LDLCALC 101* 08/18/2014   ALT 13 07/07/2015   AST 14 07/07/2015   NA 143 07/07/2015   K 4.8  07/07/2015   CL 103 07/07/2015   CREATININE 0.75 07/07/2015   BUN 8 07/07/2015   CO2 24 07/07/2015   TSH 1.13 11/15/2014   INR 0.97 10/01/2013   HGBA1C 5.7* 08/18/2014    No results found.  Assessment & Plan:   Tahani was seen today for  cough.  Diagnoses and all orders for this visit:  Acute bronchitis, unspecified organism -     betamethasone acetate-betamethasone sodium phosphate (CELESTONE) injection 6 mg; Inject 1 mL (6 mg total) into the muscle once.  Other orders -     azithromycin (ZITHROMAX Z-PAK) 250 MG tablet; Take two right away Then one a day for the next 4 days. -     HYDROcodone-homatropine (HYCODAN) 5-1.5 MG/5ML syrup; Take 5 mLs by mouth every 6 (six) hours as needed for cough.      I have discontinued Ms. Albert's oxyCODONE-acetaminophen. I am also having her start on azithromycin and HYDROcodone-homatropine. Additionally, I am having her maintain her ferrous sulfate, cyanocobalamin, Multiple Vitamins-Minerals (MULTIVITAMIN PO), and ibuprofen. We administered betamethasone acetate-betamethasone sodium phosphate.  Meds ordered this encounter  Medications  . betamethasone acetate-betamethasone sodium phosphate (CELESTONE) injection 6 mg    Sig:   . azithromycin (ZITHROMAX Z-PAK) 250 MG tablet    Sig: Take two right away Then one a day for the next 4 days.    Dispense:  6 each    Refill:  0  . HYDROcodone-homatropine (HYCODAN) 5-1.5 MG/5ML syrup    Sig: Take 5 mLs by mouth every 6 (six) hours as needed for cough.    Dispense:  120 mL    Refill:  0     Follow-up: Return if symptoms worsen or fail to improve.  Claretta Fraise, M.D.

## 2016-02-12 ENCOUNTER — Ambulatory Visit (INDEPENDENT_AMBULATORY_CARE_PROVIDER_SITE_OTHER): Payer: BLUE CROSS/BLUE SHIELD | Admitting: Family Medicine

## 2016-02-12 ENCOUNTER — Encounter: Payer: Self-pay | Admitting: Family Medicine

## 2016-02-12 VITALS — BP 138/78 | HR 104 | Temp 98.4°F | Ht 64.0 in | Wt 161.4 lb

## 2016-02-12 DIAGNOSIS — N200 Calculus of kidney: Secondary | ICD-10-CM | POA: Diagnosis not present

## 2016-02-12 DIAGNOSIS — R300 Dysuria: Secondary | ICD-10-CM | POA: Diagnosis not present

## 2016-02-12 MED ORDER — KETOROLAC TROMETHAMINE 60 MG/2ML IM SOLN
60.0000 mg | Freq: Once | INTRAMUSCULAR | Status: AC
  Administered 2016-02-12: 60 mg via INTRAMUSCULAR

## 2016-02-12 MED ORDER — HYDROCODONE-ACETAMINOPHEN 5-325 MG PO TABS: 1.0000 | ORAL_TABLET | Freq: Four times a day (QID) | ORAL | Status: DC | PRN

## 2016-02-12 MED ORDER — PROMETHAZINE HCL 25 MG PO TABS: 25.0000 mg | ORAL_TABLET | Freq: Three times a day (TID) | ORAL | Status: DC | PRN

## 2016-02-12 NOTE — Progress Notes (Signed)
BP 138/78 mmHg  Pulse 104  Temp(Src) 98.4 F (36.9 C) (Oral)  Ht 5' 4"  (1.626 m)  Wt 161 lb 6.4 oz (73.211 kg)  BMI 27.69 kg/m2  LMP  (Approximate)   Subjective:    Patient ID: Theresa [name], female    DOB: [birth year], 45 y.o.   MRN: [id number]  HPI: Theresa [name] is a 45 y.o. female presenting on 02/12/2016 for Nephrolithiasis   HPI Kidney stone follow-up Patient is coming in today for a follow-up after being diagnosed with kidney stones. She was seen at [facility] 4 days ago and diagnosed with 2 small kidney stones and given Flomax and Norco and nausea medication. She has passed one of the stones but is still having significant pain on the left flank from the other. She denies any fevers or chills but does have dysuria and some intermittent hematuria. She has had kidney stones previously but they have always passed without issue.  Relevant past medical, surgical, family and social history reviewed and updated as indicated. Interim medical history since our last visit reviewed. Allergies and medications reviewed and updated.  Review of Systems  Constitutional: Negative for fever and chills.  HENT: Negative for congestion, ear discharge and ear pain.   Eyes: Negative for redness and visual disturbance.  Respiratory: Negative for chest tightness and shortness of breath.   Cardiovascular: Negative for chest pain and leg swelling.  Gastrointestinal: Negative for abdominal pain.  Genitourinary: Positive for dysuria, hematuria and flank pain. Negative for frequency and difficulty urinating.  Musculoskeletal: Negative for back pain and gait problem.  Skin: Negative for rash.  Neurological: Negative for light-headedness and headaches.  Psychiatric/Behavioral: Negative for behavioral problems and agitation.  All other systems reviewed and are negative.   Per HPI unless specifically indicated above     Medication List       This list is accurate as of: 02/12/16  3:44  PM.  Always use your most recent med list.               cyanocobalamin 1000 MCG/ML injection  Commonly known as:  (VITAMIN B-12)  Inject 1 mL (1,000 mcg total) into the muscle every 7 (seven) days. Please give appropriate syringes & needles for drawing & administering.     HYDROcodone-acetaminophen 5-325 MG tablet  Commonly known as:  NORCO/VICODIN  Take 1 tablet by mouth every 6 (six) hours as needed for moderate pain.     ondansetron 4 MG disintegrating tablet  Commonly known as:  ZOFRAN-ODT  Take 4 mg by mouth every 8 (eight) hours as needed for nausea or vomiting.     tamsulosin 0.4 MG Caps capsule  Commonly known as:  FLOMAX  Take 0.4 mg by mouth.           Objective:    BP 138/78 mmHg  Pulse 104  Temp(Src) 98.4 F (36.9 C) (Oral)  Ht 5' 4"  (1.626 m)  Wt 161 lb 6.4 oz (73.211 kg)  BMI 27.69 kg/m2  LMP  (Approximate)  Wt Readings from Last 3 Encounters:  02/12/16 161 lb 6.4 oz (73.211 kg)  11/24/15 158 lb (71.668 kg)  09/05/15 150 lb (68.04 kg)    Physical Exam  Constitutional: She is oriented to person, place, and time. She appears well-developed and well-nourished. No distress.  Eyes: Conjunctivae and EOM are normal. Pupils are equal, round, and reactive to light.  Cardiovascular: Normal rate, regular rhythm, normal heart sounds and intact distal pulses.   No murmur  heard. Pulmonary/Chest: Effort normal and breath sounds normal. No respiratory distress. She has no wheezes.  Abdominal: Soft. Bowel sounds are normal. She exhibits no distension. There is no hepatosplenomegaly. There is no tenderness. There is CVA tenderness (Left). There is no rebound.  Musculoskeletal: Normal range of motion. She exhibits no edema or tenderness.  Neurological: She is alert and oriented to person, place, and time. Coordination normal.  Skin: Skin is warm and dry. No rash noted. She is not diaphoretic.  Psychiatric: She has a normal mood and affect. Her behavior is normal.    Nursing note and vitals reviewed.     Assessment & Plan:   Problem List Items Addressed This Visit    None    Visit Diagnoses    Dysuria    -  Primary    Relevant Medications    promethazine (PHENERGAN) 25 MG tablet    Renal calculi        Relevant Medications    ketorolac (TORADOL) injection 60 mg    HYDROcodone-acetaminophen (NORCO/VICODIN) 5-325 MG tablet    promethazine (PHENERGAN) 25 MG tablet    Other Relevant Orders    Urinalysis, Complete    Stone analysis       Follow up plan: Return if symptoms worsen or fail to improve.  Counseling provided for all of the vaccine components Orders Placed This Encounter  Procedures  . Stone analysis  . Urinalysis, Complete    Caryl Pina, MD [facility] 02/12/2016, 3:44 PM

## 2016-02-12 NOTE — Progress Notes (Signed)
Tardol 60mg /ml given in Right upper outer gluteal. Pt tolerated well.

## 2016-03-20 LAB — URINALYSIS, COMPLETE
BILIRUBIN UA: NEGATIVE
KETONES UA: NEGATIVE
Leukocytes, UA: NEGATIVE
NITRITE UA: NEGATIVE
Protein, UA: NEGATIVE
RBC, UA: NEGATIVE
UUROB: 0.2 mg/dL (ref 0.2–1.0)
pH, UA: 5.5 (ref 5.0–7.5)

## 2016-03-20 LAB — MICROSCOPIC EXAMINATION
Bacteria, UA: NONE SEEN
RBC MICROSCOPIC, UA: NONE SEEN /HPF (ref 0–?)

## 2016-07-16 ENCOUNTER — Encounter (HOSPITAL_COMMUNITY): Payer: Self-pay

## 2016-08-14 ENCOUNTER — Emergency Department (HOSPITAL_COMMUNITY): Payer: BLUE CROSS/BLUE SHIELD

## 2016-08-14 ENCOUNTER — Emergency Department (HOSPITAL_COMMUNITY)
Admission: EM | Admit: 2016-08-14 | Discharge: 2016-08-14 | Disposition: A | Discharge status: Home / Self Care | Payer: BLUE CROSS/BLUE SHIELD | Attending: Emergency Medicine | Admitting: Emergency Medicine

## 2016-08-14 ENCOUNTER — Encounter (HOSPITAL_COMMUNITY): Payer: Self-pay | Admitting: Emergency Medicine

## 2016-08-14 DIAGNOSIS — S161XXA Strain of muscle, fascia and tendon at neck level, initial encounter: Secondary | ICD-10-CM | POA: Diagnosis not present

## 2016-08-14 DIAGNOSIS — Y9389 Activity, other specified: Secondary | ICD-10-CM | POA: Insufficient documentation

## 2016-08-14 DIAGNOSIS — S199XXA Unspecified injury of neck, initial encounter: Secondary | ICD-10-CM | POA: Diagnosis present

## 2016-08-14 DIAGNOSIS — F909 Attention-deficit hyperactivity disorder, unspecified type: Secondary | ICD-10-CM | POA: Diagnosis not present

## 2016-08-14 DIAGNOSIS — I10 Essential (primary) hypertension: Secondary | ICD-10-CM | POA: Insufficient documentation

## 2016-08-14 DIAGNOSIS — S8002XA Contusion of left knee, initial encounter: Secondary | ICD-10-CM | POA: Diagnosis not present

## 2016-08-14 DIAGNOSIS — Z79899 Other long term (current) drug therapy: Secondary | ICD-10-CM | POA: Diagnosis not present

## 2016-08-14 DIAGNOSIS — R079 Chest pain, unspecified: Secondary | ICD-10-CM | POA: Insufficient documentation

## 2016-08-14 DIAGNOSIS — Y999 Unspecified external cause status: Secondary | ICD-10-CM | POA: Diagnosis not present

## 2016-08-14 DIAGNOSIS — S39012A Strain of muscle, fascia and tendon of lower back, initial encounter: Secondary | ICD-10-CM

## 2016-08-14 DIAGNOSIS — Y92410 Unspecified street and highway as the place of occurrence of the external cause: Secondary | ICD-10-CM | POA: Insufficient documentation
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
## 2016-08-14 MED ORDER — CYCLOBENZAPRINE HCL 10 MG PO TABS: 10.0000 mg | ORAL_TABLET | Freq: Three times a day (TID) | ORAL | 0 refills | Status: DC | PRN

## 2016-08-14 MED ORDER — NAPROXEN 500 MG PO TABS: 500.0000 mg | ORAL_TABLET | Freq: Two times a day (BID) | ORAL | 0 refills | Status: DC

## 2016-08-14 MED ORDER — DIPHENHYDRAMINE HCL 25 MG PO CAPS
25.0000 mg | ORAL_CAPSULE | Freq: Once | ORAL | Status: AC
  Administered 2016-08-14: 25 mg via ORAL
  Filled 2016-08-14: qty 1

## 2016-08-14 MED ORDER — OXYCODONE-ACETAMINOPHEN 5-325 MG PO TABS: 1.0000 | ORAL_TABLET | ORAL | 0 refills | Status: DC | PRN

## 2016-08-14 MED ORDER — CYCLOBENZAPRINE HCL 10 MG PO TABS
10.0000 mg | ORAL_TABLET | Freq: Once | ORAL | Status: AC
  Administered 2016-08-14: 10 mg via ORAL
  Filled 2016-08-14: qty 1

## 2016-08-14 MED ORDER — KETOROLAC TROMETHAMINE 60 MG/2ML IM SOLN
60.0000 mg | Freq: Once | INTRAMUSCULAR | Status: AC
  Administered 2016-08-14: 60 mg via INTRAMUSCULAR
  Filled 2016-08-14: qty 2

## 2016-08-14 MED ORDER — OXYCODONE-ACETAMINOPHEN 5-325 MG PO TABS
1.0000 | ORAL_TABLET | Freq: Once | ORAL | Status: AC
Start: 2016-08-14 — End: 2016-08-14
  Administered 2016-08-14: 1 via ORAL
  Filled 2016-08-14: qty 1
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
## 2016-08-14 NOTE — Discharge Instructions (Signed)
Apply ice packs on/off to your neck, back and knee.  Follow-up with your primary provider in a few days if the symptoms are not improving
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
## 2016-08-22 ENCOUNTER — Telehealth (HOSPITAL_COMMUNITY): Payer: Self-pay

## 2016-08-22 NOTE — Telephone Encounter (Signed)
This patient is overdue for recommended follow-up with a bariatric surgeon at [facility]. A letter was mailed to the address on file 07/16/16 from both [place] in attempt to reestablish post-op care. The letter included a patient survey which was returned to the [facility].  Patient declined an appointment advising she has met her goals & does not see the need for bariatric follow-up at this time.  Copy of the survey was shared with Mallory Shirk at [facility] so she may file in patients office chart.

## 2016-11-21 NOTE — Op Note (Signed)
NAMEMARICARMEN, BRAZIEL              ACCOUNT NO.:  [id number]  MEDICAL RECORD NO.:  [id number]  LOCATION:                                 FACILITY:  PHYSICIAN:  Ralene Bathe. Matthew Saras, M.D.DATE OF BIRTH:  Jun 04, 1971  DATE OF PROCEDURE: DATE OF DISCHARGE:                              OPERATIVE REPORT   PREOPERATIVE DIAGNOSES:  Menorrhagia, dysmenorrhea, adenomyosis, failed endometrial ablation.  POSTOPERATIVE DIAGNOSES:  Menorrhagia, dysmenorrhea, adenomyosis, failed endometrial ablation.  PROCEDURE:  TVH, right salpingectomy.  SURGEON:  Ralene Bathe. Matthew Saras, MD  ASSISTANT:  Darlyn Chamber, MD  ANESTHESIA:  General.  DRAINS:  Foley catheter.  BLOOD LOSS:  150.  SPECIMENS REMOVED:  Uterus right tube.  PROCEDURE AND FINDINGS:  Patient was taken to the operating room, after an adequate level of general anesthesia was obtained with the patient's legs in stirrups.  The perineum and vagina prepped and draped in the usual fashion.  Foley catheter was positioned.  Appropriate time-outs were taken.  EUA was carried out.  The uterus was at upper limit of normal size mid to posterior mobile, adnexa negative.  Weighted speculum was positioned.  Cervix grasped with tenaculum.  The cervical vaginal mucosa was incised.  The bladder was advanced superiorly until the anterior peritoneal reflection could be identified, this was entered sharply and a retractor was then used to gently elevate the bladder out of the field.  Posteriorly, posterior culdotomy was performed. Uterosacral ligaments were clamped, divided, and suture ligated with 0 Vicryl and held temporarily.  With the uterus on downward traction in sequential manner, the uterine vasculature, pedicle, cardinal ligament, upper broad ligament pedicles were clamped, divided, and suture ligated. Attempted to deliver the fundus of the uterus posteriorly due to the enlarged globular fundus, became easier to morcellate and told the fundus could  be delivered.  Remaining pedicles were clamped, divided, 1st free tied followed by suture ligature of 0 Vicryl.  The left tube and ovary were normal.  The right tube was dilated and seemed to be under some tension, therefore right salpingectomy was carried out.  Some old blood was released during the procedure from within the tube, this was hemostatic.  Prior to closure, sponge, needle, and instrument counts were reported as correct x2.  The vaginal mucosa was then closed right to left with interrupted 2-0 Monocryl sutures with excellent hemostasis.  Clear urine noted at the end the case.  She tolerated this well, went to recovery room in good condition.     Faigy Stretch M. Matthew Saras, M.D.   ______________________________ Ralene Bathe. Matthew Saras, M.D.    [facility]/MEDQ  D:  09/05/2015  T:  09/05/2015  Job:  [id number]

## 2016-11-27 ENCOUNTER — Other Ambulatory Visit: Payer: Self-pay | Admitting: Obstetrics and Gynecology

## 2016-11-27 DIAGNOSIS — R1032 Left lower quadrant pain: Secondary | ICD-10-CM

## 2016-12-02 ENCOUNTER — Ambulatory Visit
Admission: RE | Admit: 2016-12-02 | Discharge: 2016-12-02 | Disposition: A | Discharge status: Home / Self Care | Payer: BC Managed Care – PPO | Source: Ambulatory Visit | Attending: Obstetrics and Gynecology | Admitting: Obstetrics and Gynecology

## 2016-12-02 ENCOUNTER — Other Ambulatory Visit: Payer: BLUE CROSS/BLUE SHIELD

## 2016-12-02 DIAGNOSIS — R1032 Left lower quadrant pain: Secondary | ICD-10-CM

## 2016-12-02 IMAGING — CT CT ABD-PELV W/ CM
3 of 5 series · 13 of 36 positions shown, 19 images · IV contrast (READICAT/WATER & [ID] ISOVUE 300)
Comparison: [DATE] abdominal ultrasound. [DATE]
abdominopelvic CT, report only.

CLINICAL DATA: Right lower quadrant pain for 6 months with
constipation. Left lower quadrant pain.

EXAM:
CT ABDOMEN AND PELVIS WITH CONTRAST
TECHNIQUE: Multidetector CT imaging of the abdomen and pelvis was performed
using the standard protocol following bolus administration of
intravenous contrast.
CONTRAST:  100mL [I9] IOPAMIDOL ([I9]) INJECTION 61%

[Series 3: abd/pelvis with · axial · 0.70mm/px · z∈[-328,-53]mm · 6 of 78 slices shown, 11 images]
[im 12/78  soft-tissue]
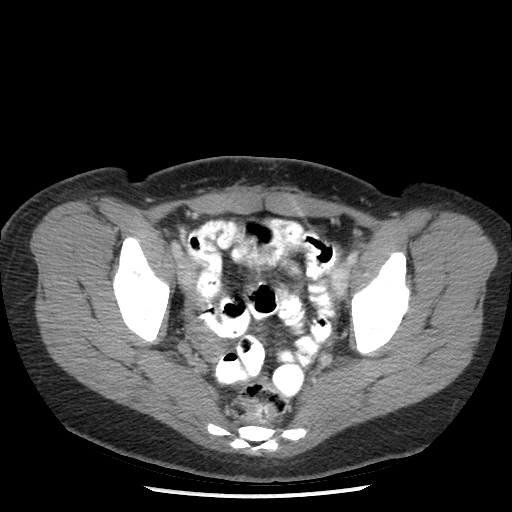
[im 12/78  bone]
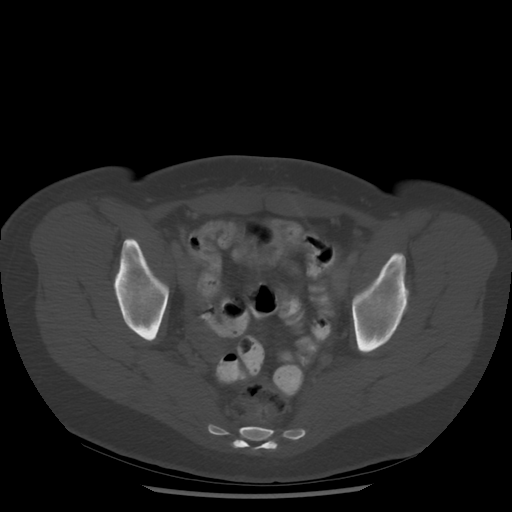
[im 23/78  soft-tissue]
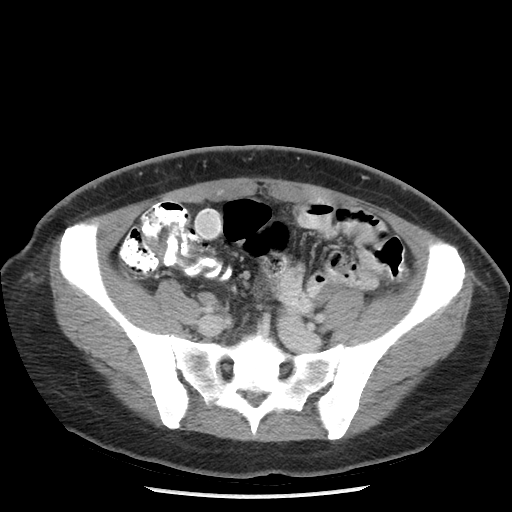
[im 34/78  soft-tissue]
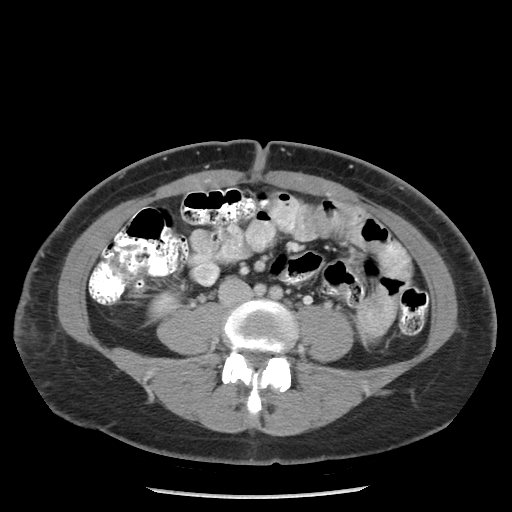
[im 34/78  lung]
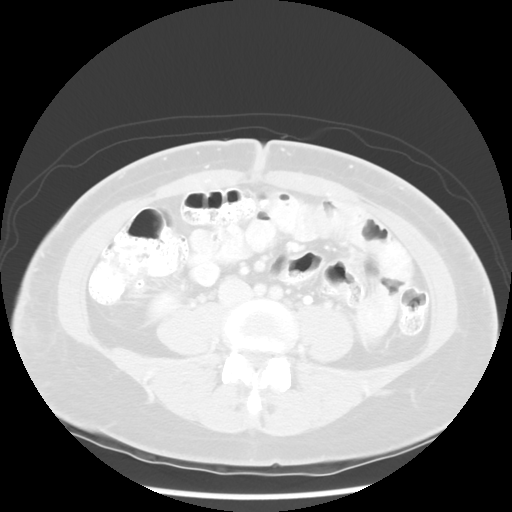
[im 45/78  soft-tissue]
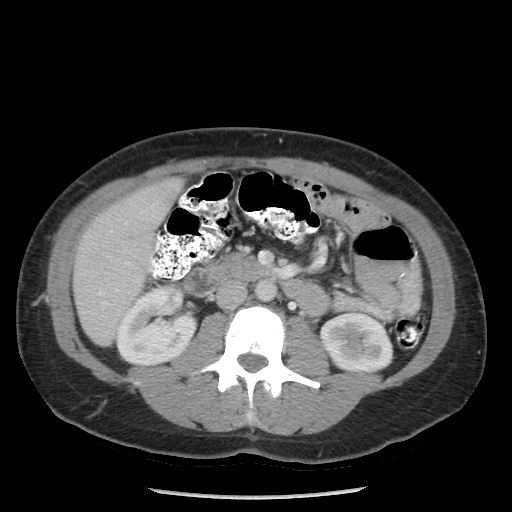
[im 45/78  lung]
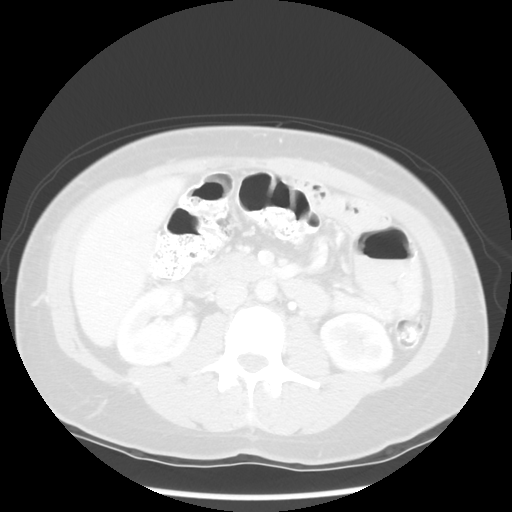
[im 56/78  soft-tissue]
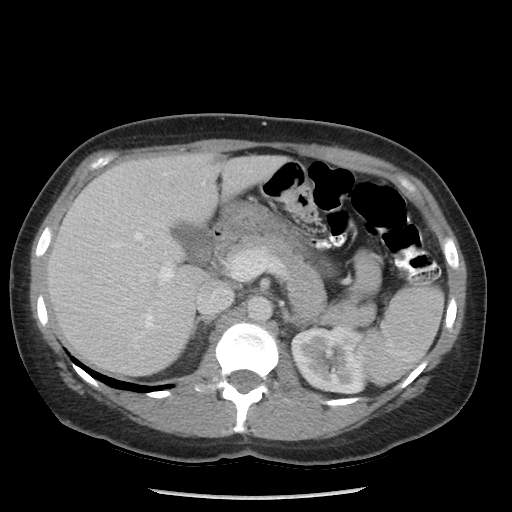
[im 56/78  lung]
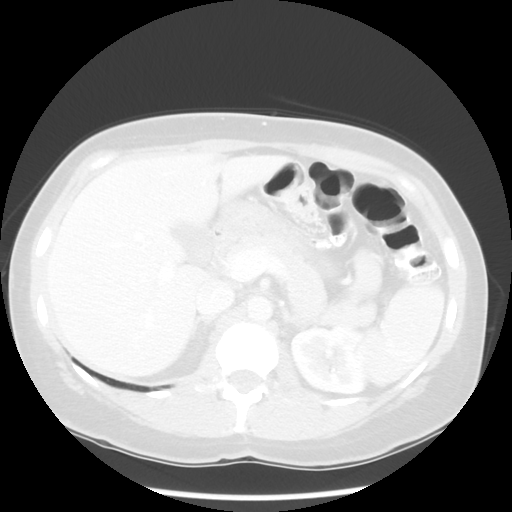
[im 67/78  soft-tissue]
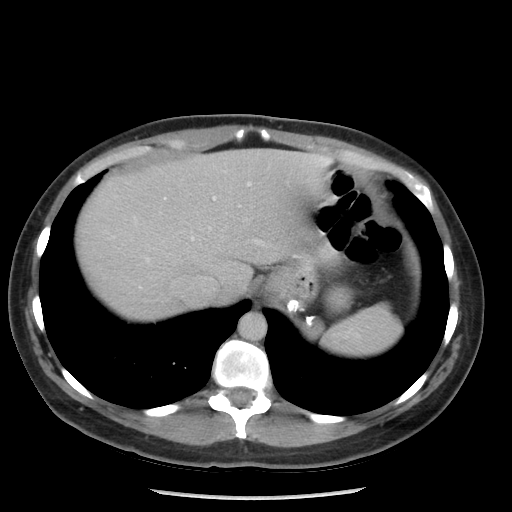
[im 67/78  lung]
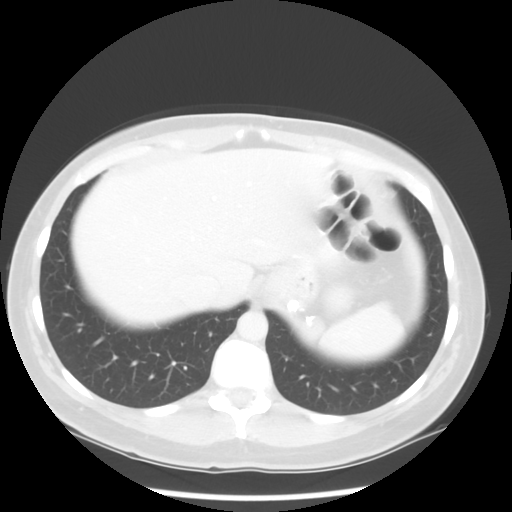

[Series 601: coronal body · coronal · 0.83mm/px · 1 of 116 slices shown, 2 images]
[im 39/116  soft-tissue]
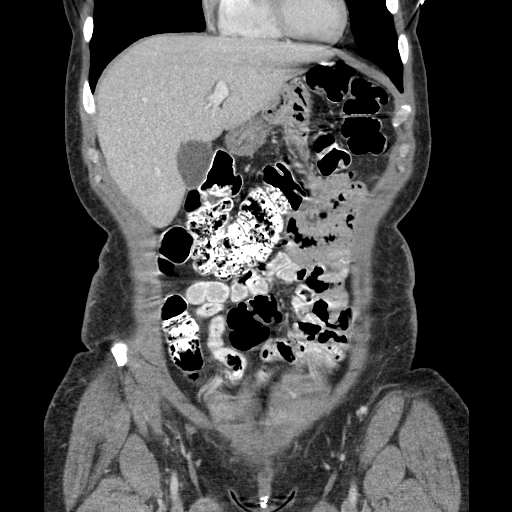
[im 39/116  bone]
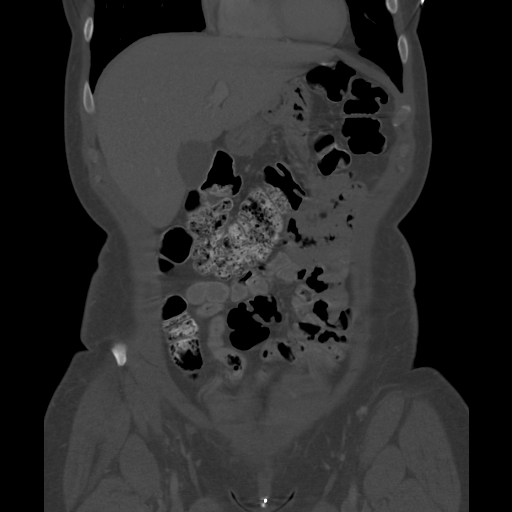

[Series 602: sagittal body · sagittal · 0.83mm/px · 6 of 145 slices shown]
[im 10/145  soft-tissue]
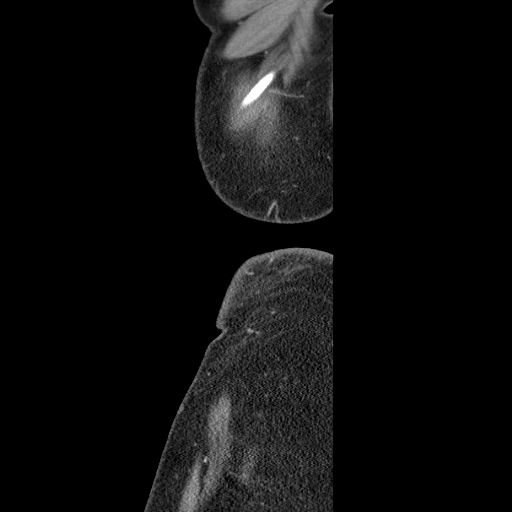
[im 28/145  soft-tissue]
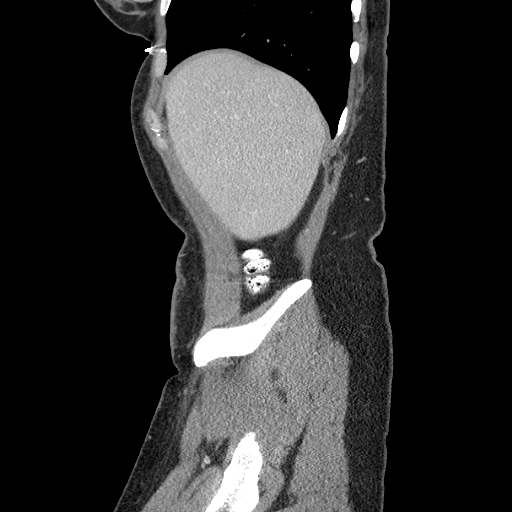
[im 46/145  soft-tissue]
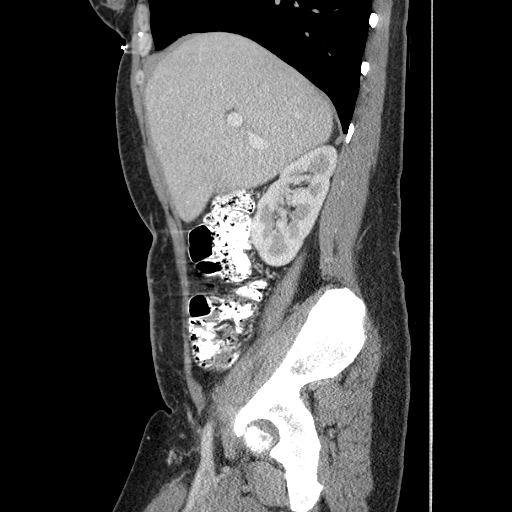
[im 64/145  soft-tissue]
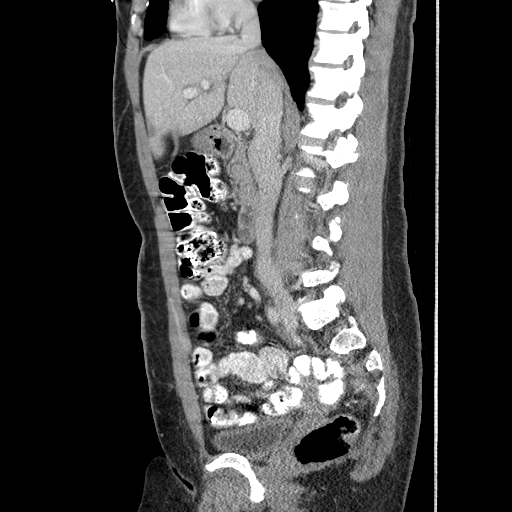
[im 82/145  soft-tissue]
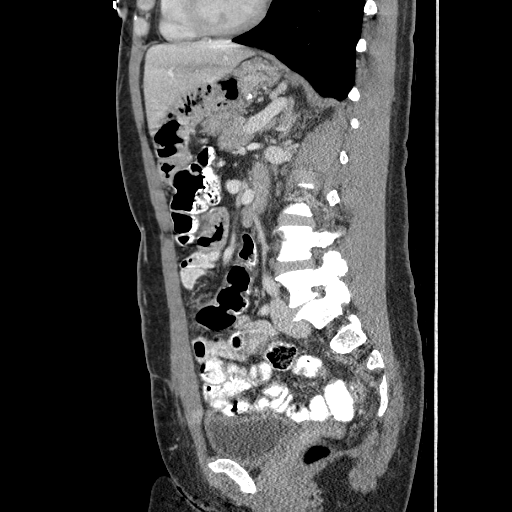
[im 100/145  soft-tissue]
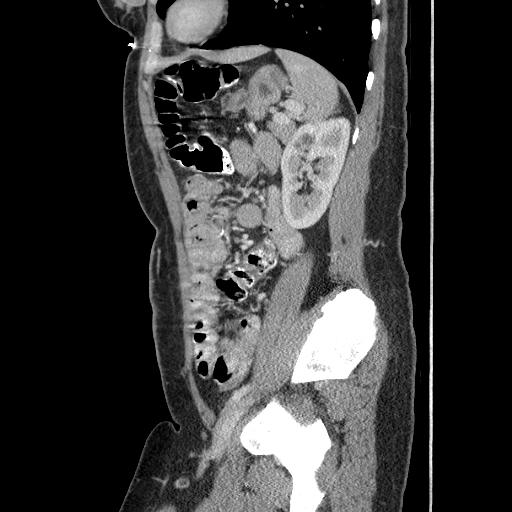

[13 of 36 positions shown; findings below may reference images not displayed]

FINDINGS: Lower chest: Clear lung bases. Normal heart size without pericardial
or pleural effusion.

Hepatobiliary: Focal steatosis adjacent the falciform ligament.
Normal gallbladder, without biliary ductal dilatation.

Pancreas: Normal, without mass or ductal dilatation.

Spleen: Normal in size, without focal abnormality.

Adrenals/Urinary Tract: Normal adrenal glands. Too small to
characterize left renal lesion. Normal right kidney, without
hydronephrosis. Normal urinary bladder.

Stomach/Bowel: Roux-en-Y gastric bypass, without acute complication.
No contrast in the bypassed stomach to suggest gastric gastric
fistula. Normal caliber Roux loop. Normal colon, appendix, and
terminal ileum. Normal small bowel.

Vascular/Lymphatic: Normal caliber of the aorta and branch vessels.
No abdominopelvic adenopathy.

Reproductive: Hysterectomy.  No adnexal mass.

Other: No significant free fluid.  Genital piercing.

Musculoskeletal: Degenerative disc disease at the lumbosacral
junction. Mild disc bulges at L4-5 and L5-S1.
IMPRESSION: 1.  No acute process in the abdomen or pelvis.
2. Status post gastric bypass, without acute complication.

## 2016-12-02 MED ORDER — IOPAMIDOL (ISOVUE-300) INJECTION 61%
100.0000 mL | Freq: Once | INTRAVENOUS | Status: AC | PRN
  Administered 2016-12-02: 100 mL via INTRAVENOUS

## 2017-01-07 HISTORY — PX: BREAST BIOPSY: SHX20

## 2017-01-17 ENCOUNTER — Ambulatory Visit
Admission: RE | Admit: 2017-01-17 | Discharge: 2017-01-17 | Disposition: A | Discharge status: Home / Self Care | Payer: BC Managed Care – PPO | Source: Ambulatory Visit | Attending: Obstetrics and Gynecology | Admitting: Obstetrics and Gynecology

## 2017-01-17 ENCOUNTER — Other Ambulatory Visit: Payer: Self-pay | Admitting: Obstetrics and Gynecology

## 2017-01-17 DIAGNOSIS — N611 Abscess of the breast and nipple: Secondary | ICD-10-CM

## 2017-01-17 IMAGING — MG 2D DIGITAL DIAGNOSTIC BILATERAL MAMMOGRAM WITH IMPLANTS, CAD AND
8 of 19 series · 8 of 39 positions shown · non-contrast
Comparison: Previous exam(s).

CLINICAL DATA: Assess abscess of the left breast. Patient is on
second round of antibiotics.

EXAM:
2D DIGITAL DIAGNOSTIC BILATERAL MAMMOGRAM WITH IMPLANTS, CAD AND
ADJUNCT TOMO
ULTRASOUND LEFT BREAST
The patient has implants. Standard and implant displaced views were
performed.

[R MLO]
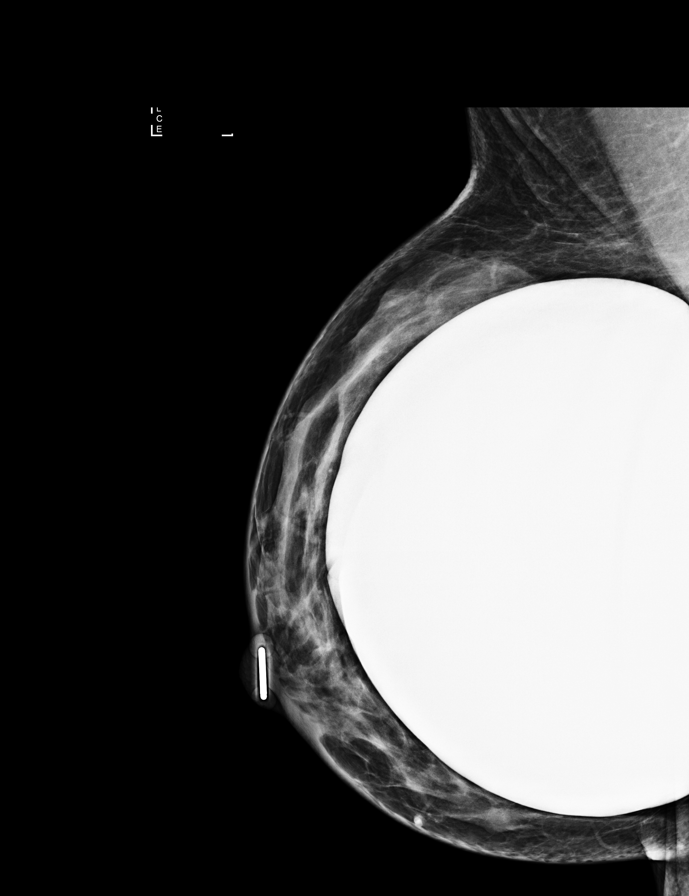

[L MLO (1 of 2)]
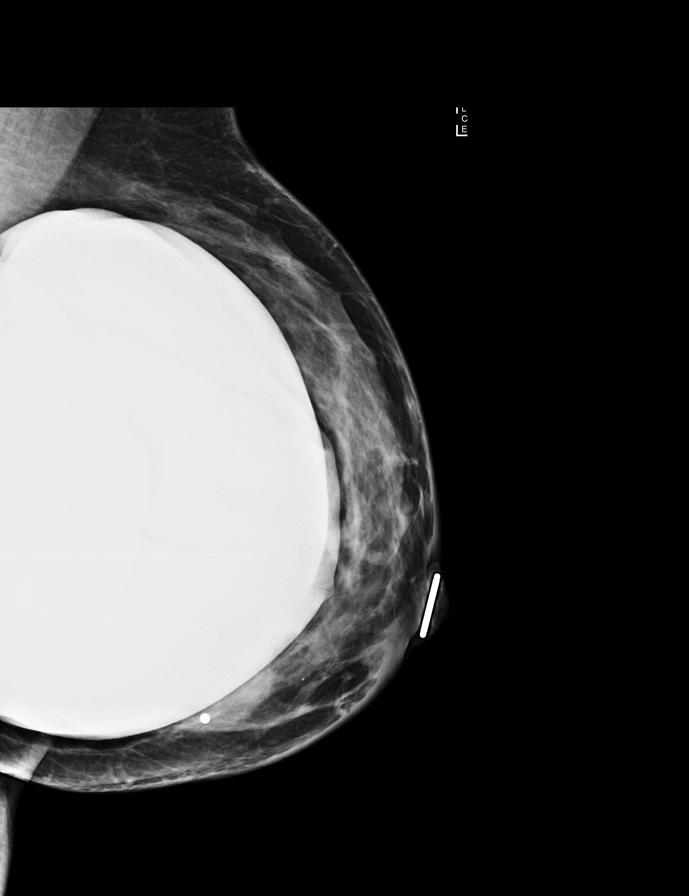

[L CC (1 of 2)]
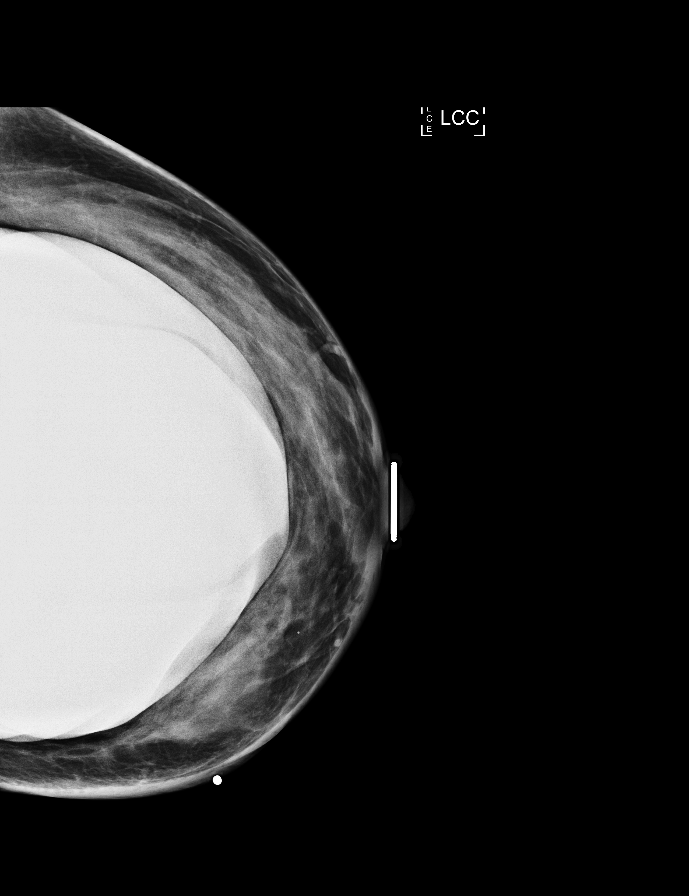

[R CC]
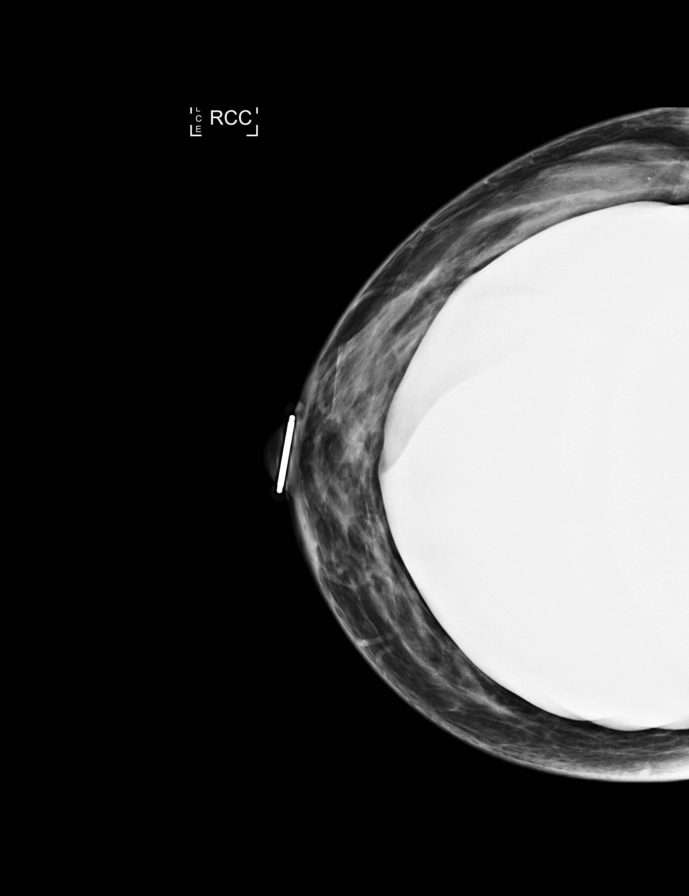

[L TAN synth-2D]
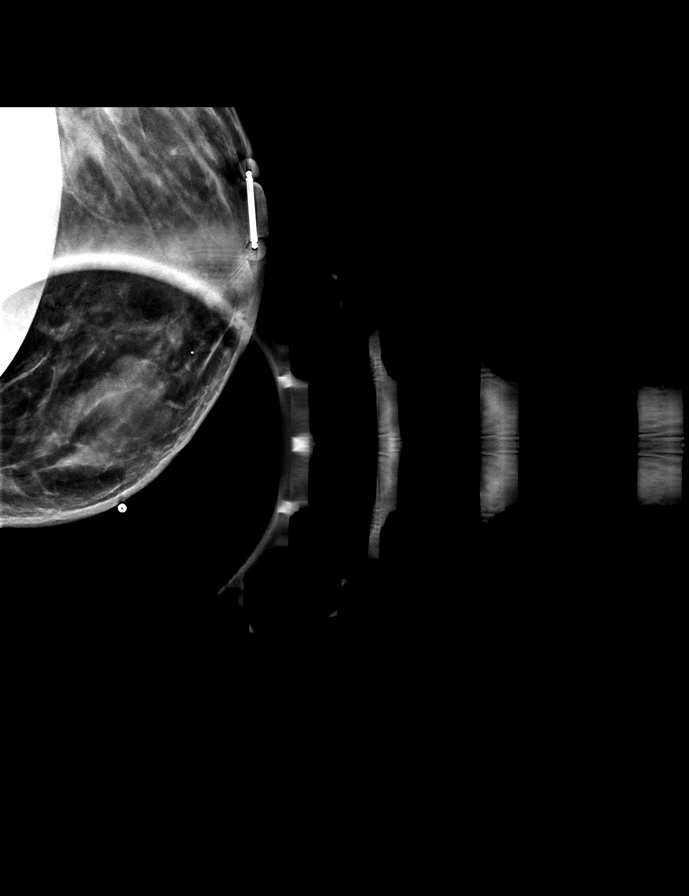

[R MLO synth-2D]
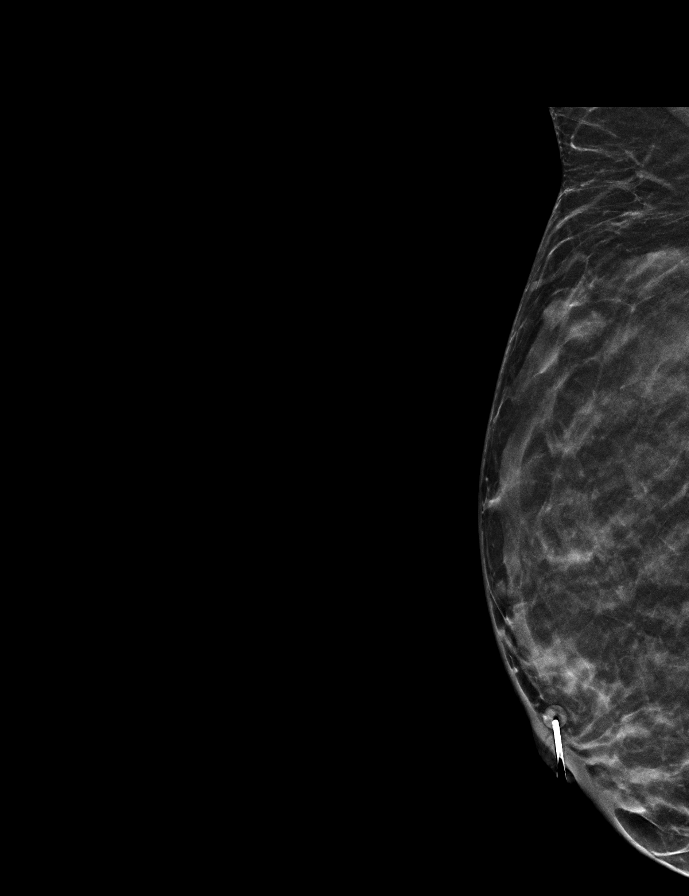

[L CC (2 of 2)]
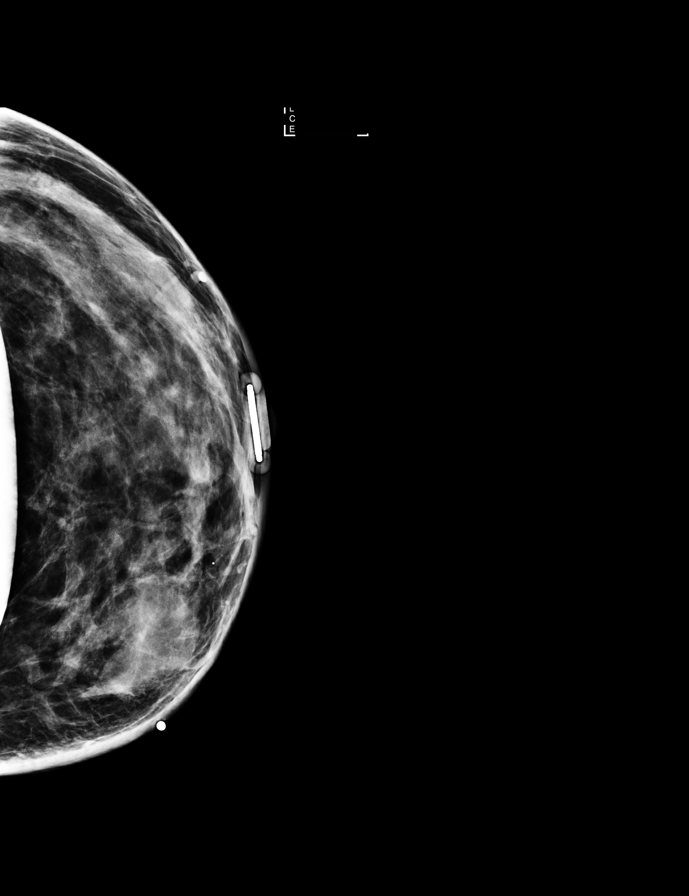

[L MLO (2 of 2)]
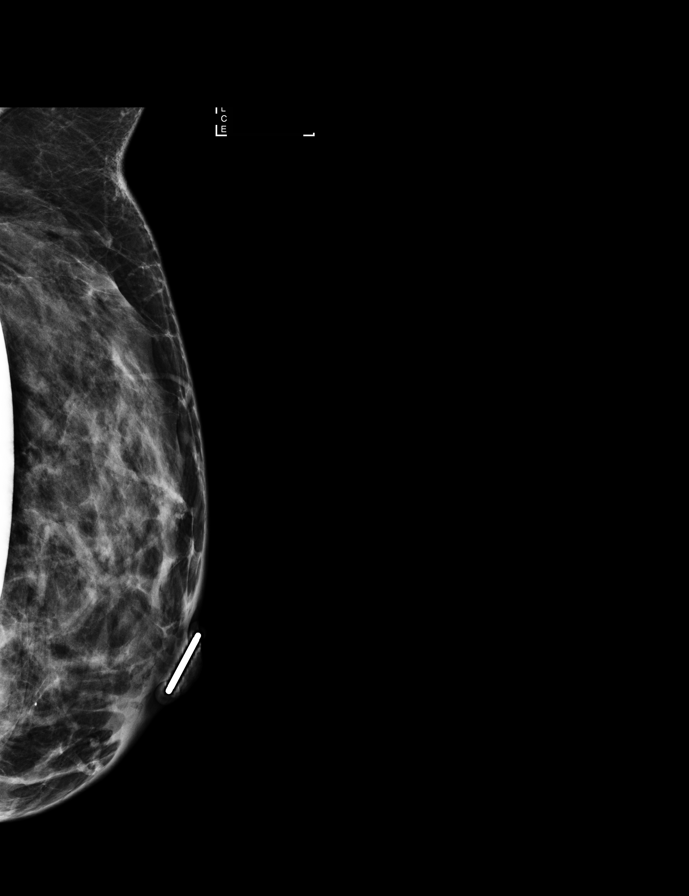

[8 of 39 positions shown; findings below may reference images not displayed]

ACR Breast Density Category c: The breast tissue is heterogeneously
dense, which may obscure small masses.
FINDINGS: Cc and MLO views of bilateral breasts, ZSCHORNACK cc and MLO views of
the bilateral breasts, spot tangential views of the left breast are
submitted. There is a 3.2 cm mass in the lower-inner quadrant left
breast palpable area. The right breasts is negative.

On physical exam, there is swelling and redness at the left breast 9
o'clock position.

Targeted ultrasound is performed, showing 2 x 1.6 x 3.2 cm
complicated fluid collection with internal septation at palpable
area left breast 9 o'clock 4 cm from nipple. This correlates the
mammographic finding. This is consistent with an abscess.

Mammographic images were processed with CAD.
IMPRESSION: Benign findings.

RECOMMENDATION:
Ultrasound-guided aspiration of left breast abscess.

I have discussed the findings and recommendations with the patient.
Results were also provided in writing at the conclusion of the
visit. If applicable, a reminder letter will be sent to the patient
regarding the next appointment.

BI-RADS CATEGORY  2: Benign.

## 2017-01-21 ENCOUNTER — Other Ambulatory Visit: Payer: Self-pay | Admitting: General Surgery

## 2017-01-21 ENCOUNTER — Ambulatory Visit
Admission: RE | Admit: 2017-01-21 | Discharge: 2017-01-21 | Disposition: A | Discharge status: Home / Self Care | Payer: BC Managed Care – PPO | Source: Ambulatory Visit | Attending: Obstetrics and Gynecology | Admitting: Obstetrics and Gynecology

## 2017-01-21 ENCOUNTER — Encounter: Payer: Self-pay | Admitting: General Surgery

## 2017-01-21 DIAGNOSIS — N611 Abscess of the breast and nipple: Secondary | ICD-10-CM

## 2017-01-21 IMAGING — US ULTRASOUND LEFT BREAST LIMITED
1 series · 5 of 5 positions shown · non-contrast
Comparison: [DATE].

CLINICAL DATA: 45-year-old with an abscess involving the inner left
breast which was originally diagnosed on [DATE] at which time
the patient was placed on antibiotic therapy. She presented to the
[HOSPITAL[facility]HOSPITAL[facility] over the weekend and was
given IV vancomycin. She presents today with worsening left breast
swelling and overlying skin erythema.

EXAM:
ULTRASOUND OF THE LEFT BREAST

[Series 1: ultrasound left breast limited · 0.07mm/px · 5 of 5 slices shown]
[im 1/5]
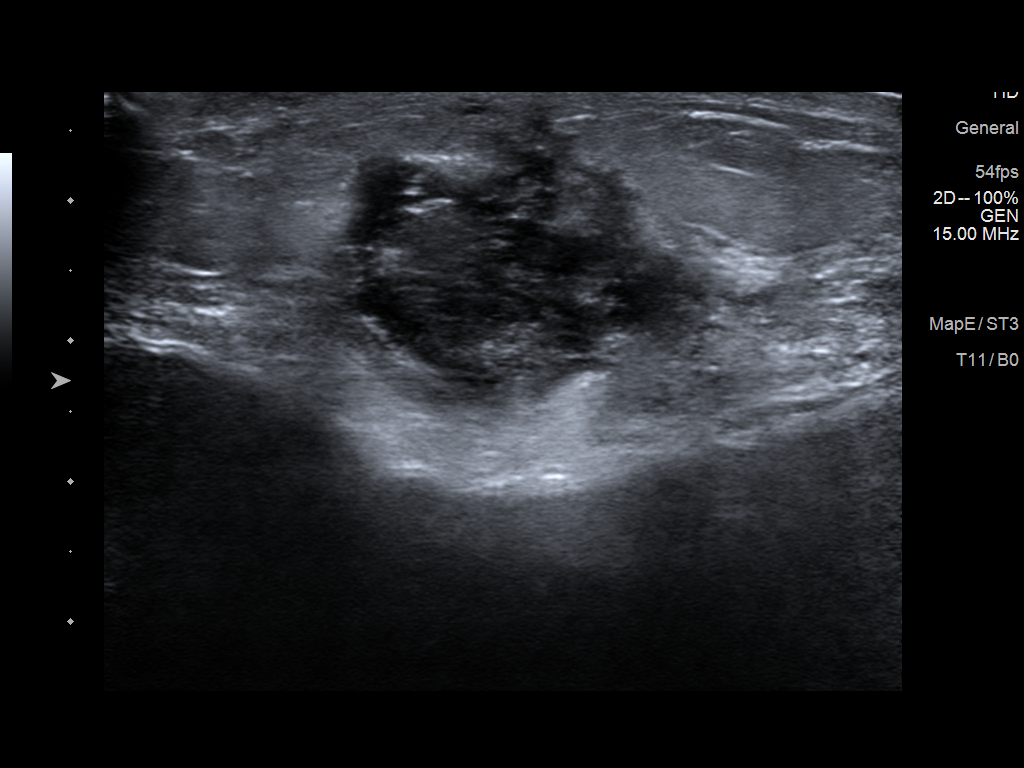
[im 2/5]
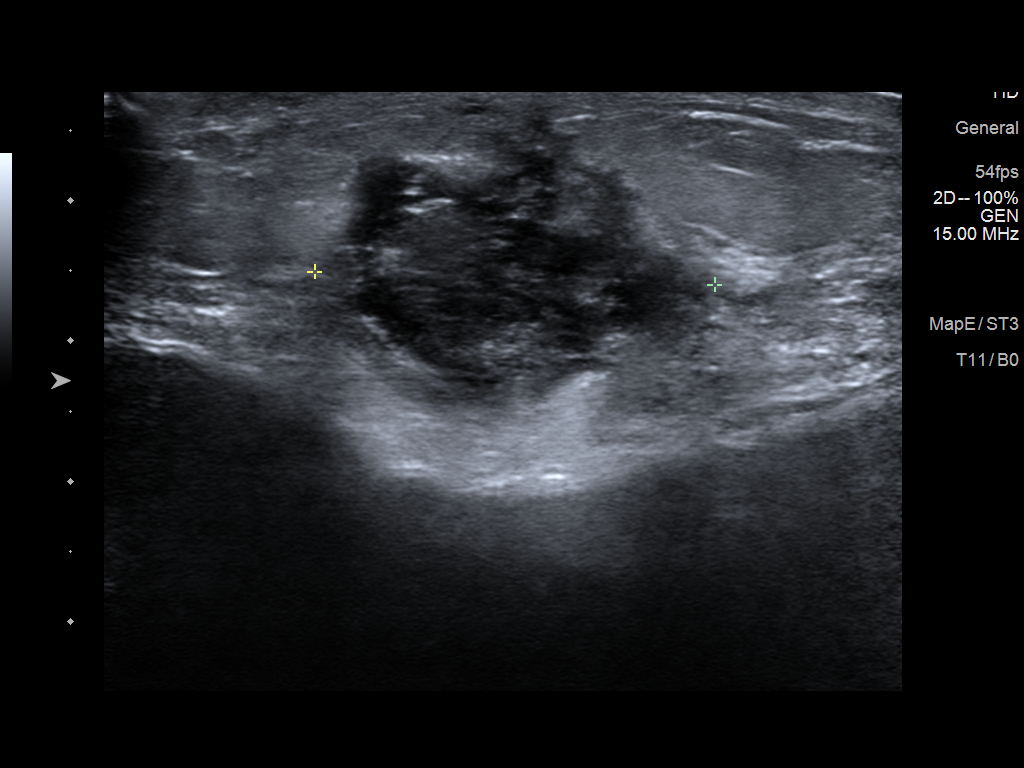
[im 3/5]
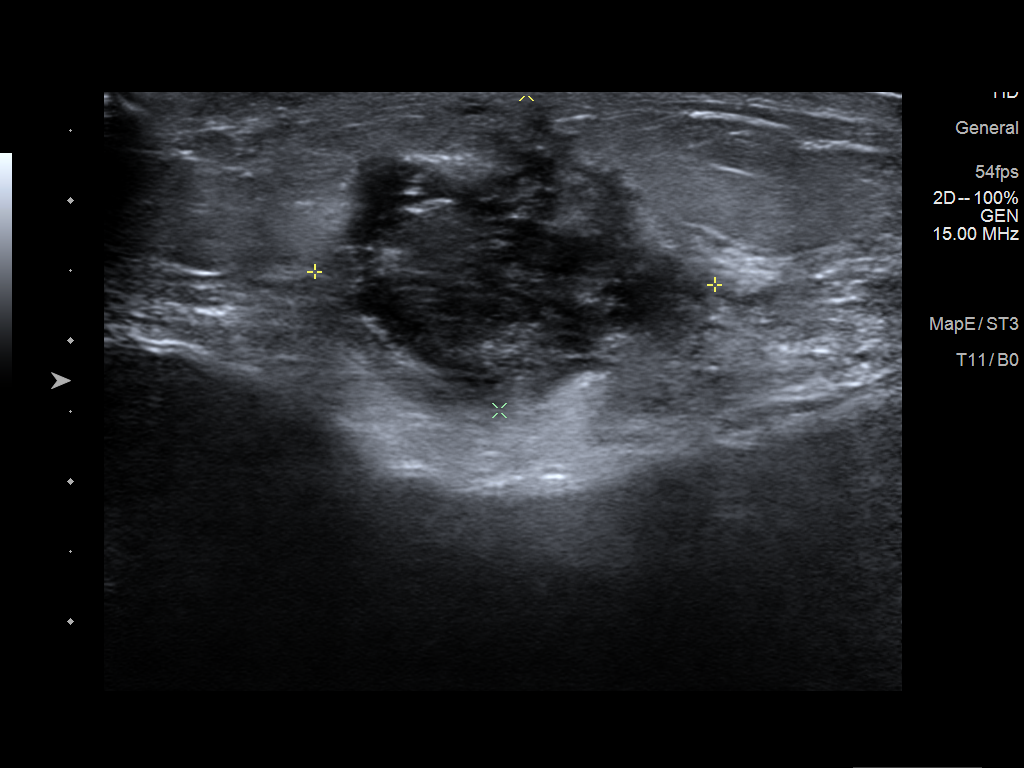
[im 4/5]
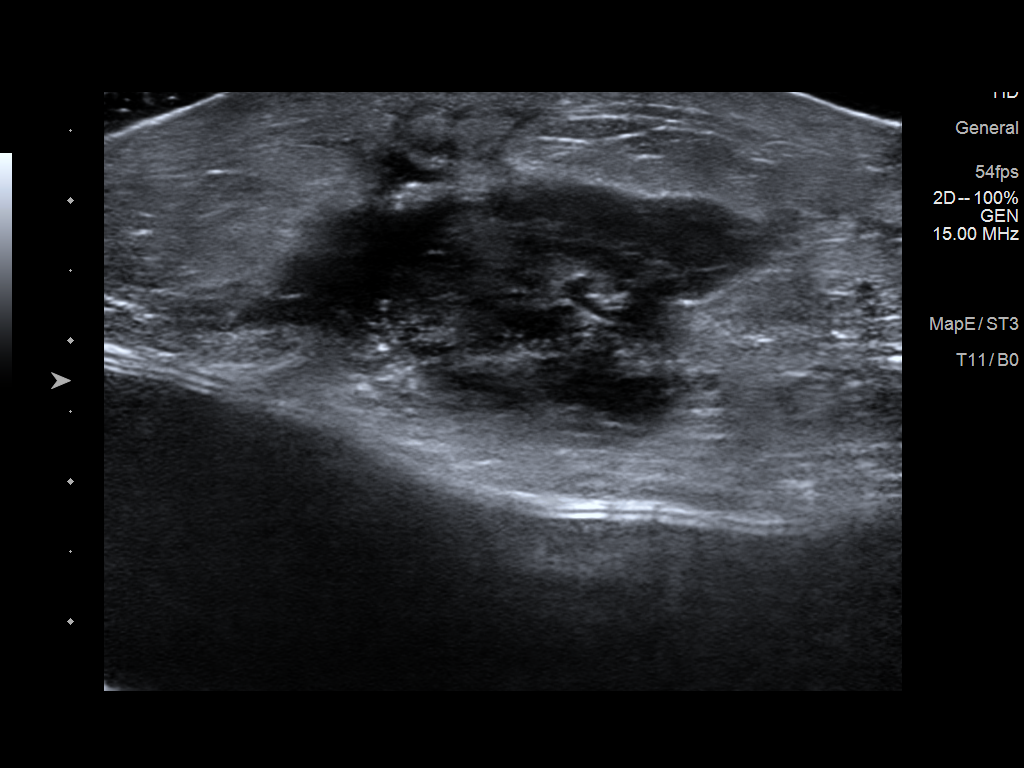
[im 5/5]
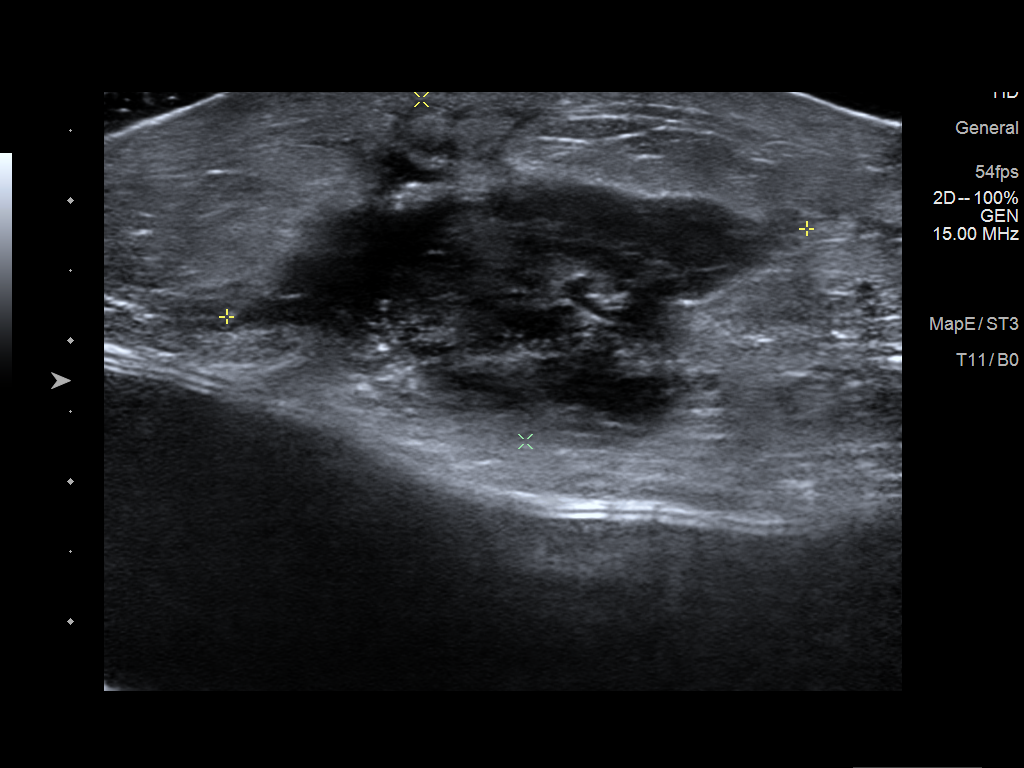

[5 of 5 positions shown; findings below may reference images not displayed]

FINDINGS: On physical exam, there is an approximate 6 cm area of induration
involving the inner left breast with overlying skin erythema. There
is no skin breakdown.

Targeted left breast ultrasound is performed, showing that the
previously identified abscess has increased in size since the
ultrasound 4 days ago, current measurements approximating 2.6 x
x 4.2 cm (previously 1.6 x 2.0 x 3.2 cm). There is a large amount of
debris/phlegmon within the abscess, and there is only a very small
fluid component at this time.
IMPRESSION: Enlarging left breast abscess despite antibiotic therapy. There is a
large amount of debris/phlegmon within the abscess and there is only
a very small fluid component, therefore aspiration would be of
limited utility.

RECOMMENDATION:
Surgical consultation for possible surgical incision and drainage.

I have discussed the findings and recommendations with the patient.
Results were also provided in writing at the conclusion of the
visit. If applicable, a reminder letter will be sent to the patient
regarding the next appointment for annual screening mammography
which will be due in [DATE].

BI-RADS CATEGORY  2: Benign.

## 2017-01-21 NOTE — H&P (Signed)
Theresa [name] Location: [facility] Patient #: [id number] DOB: 1971/05/15 Married / Language: English / Race: White Female       History of Present Illness      The patient is a 46 year old female who presents with a breast abscess. This is a 46 year old female who presents for surgical management of an abscess of the left breast at the 9 o'clock position. She is referred by Dr. Evangeline Dakin at the [facility] [place].     She has a history of bilateral augmentation mammoplasty. Dr. Lenon Curt a year and a half ago. Subglandular. No other breast problems. She has bilateral nipple jewelry. She has 10-12 days of pain and redness in the medial left breast. 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 They put her on Bactrim.     This past Friday, 5 days ago she was seen at the [facility] [place]. Needle aspiration revealed 1 cc of pus. The mass has gotten bigger. The next day she was admitted to [facility] for 24 hours and was placed on IV vancomycin. She says she was only seen by radiology. I can see that an ultrasound was done and [facility] revealing a 3.2 cm mass with a sinus tract heading toward the skin. They felt this was an abscess. No one is able to aspirate this and she is referred for surgical drainage.     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 Hysterectomy without BSO. Tubal ligation. C-section. Bilateral subglandular augmentation mammoplasty. ADD. Hypertension. Sleep apnea. BMI about 27.9. Family history reveals mother had breast cancer. 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 She lives in [place]. Separated with 4 children. Denies tobacco. Alcohol occasionally.     We are going to schedule her for surgery tomorrow. Incision and drainage left breast abscess.  Plan transverse radially oriented incision left breast at 9 o'clock position. Hopefully we can avoid injuring the implant. Told her we would leave this wound open and she would have a scar. I discussed the indications, details, techniques, and numerous risk of the surgery. She is aware the risk of bleeding, infection, recurrent infection, nerve damage with chronic pain or numbness, injury and removal of the implant. She understands all these issues. All of her questions were answered. She agrees with this plan.   Allergies  No Known Drug Allergies  Allergies Reconciled   Medication History  No Current Medications Medications Reconciled  Vitals  Weight: 162.8 lb Height: 64in Body Surface Area: 1.79 m Body Mass Index: 27.94 kg/m  Temp.: 98.97F  Pulse: 74 (Regular)  BP: 130/84 (Sitting, Left Arm, Standard)   Physical Exam  General Mental Status-Alert. General Appearance-Consistent with stated age. Hydration-Well hydrated. Voice-Normal. Note: BMI 27.9   Head and Neck Head-normocephalic, atraumatic with no lesions or palpable masses. Trachea-midline. Thyroid Gland Characteristics - normal size and consistency.  Eye Eyeball - Bilateral-Extraocular movements intact. Sclera/Conjunctiva - Bilateral-No scleral icterus.  Chest and Lung Exam Chest and lung exam reveals -quiet, even and easy respiratory effort with no use of accessory muscles and on auscultation, normal breath sounds, no adventitious sounds and normal vocal resonance. Inspection Chest Wall - Normal. Back - normal.  Breast Note: Bilateral inframammary scars. Obvious implant-based augmentation with good symmetry. 3-4 cm mass with surrounding erythema at 9 o'clock position left breast. Nipple does not seem  involved. No skin necrosis. No active drainage. Fairly firm . Not very fluctuant. No other palpable masses in either breast. No axillary  adenopathy.   Cardiovascular Cardiovascular examination reveals -normal heart sounds, regular rate and rhythm with no murmurs and normal pedal pulses bilaterally.  Abdomen Inspection Inspection of the abdomen reveals - No Hernias. Skin - Scar - Note: Laparoscopic scars from gastric bypass well-healed. Palpation/Percussion Palpation and Percussion of the abdomen reveal - Soft, Non Tender, No Rebound tenderness, No Rigidity (guarding) and No hepatosplenomegaly. Auscultation Auscultation of the abdomen reveals - Bowel sounds normal.  Neurologic Neurologic evaluation reveals -alert and oriented x 3 with no impairment of recent or remote memory. Mental Status-Normal.  Musculoskeletal Normal Exam - Left-Upper Extremity Strength Normal and Lower Extremity Strength Normal. Normal Exam - Right-Upper Extremity Strength Normal and Lower Extremity Strength Normal.  Lymphatic Head & Neck  General Head & Neck Lymphatics: Bilateral - Description - Normal. Axillary  General Axillary Region: Bilateral - Description - Normal. Tenderness - Non Tender. Femoral & Inguinal  Generalized Femoral & Inguinal Lymphatics: Bilateral - Description - Normal. Tenderness - Non Tender.    Assessment & Plan  LEFT BREAST ABSCESS (N61.1)   You have an abscess at the 9 o'clock position of your left breast This has not responded to Augmentin or Bactrim This has not responded to aspiration this has not responded to the IV vancomycin you received in [facility]  you will be scheduled for incision and drainage of your left breast abscess under general anesthesia in the operating room tomorrow Do not eat or drink anything after having a light breakfast tomorrow morning We have discussed the indications, techniques, and risks of this surgery in detail  GASTRIC BYPASS STATUS FOR OBESITY (Z98.84) IRON DEFICIENCY ANEMIA, UNSPECIFIED IRON DEFICIENCY ANEMIA TYPE (D50.9)    Keshonda Monsour M. Dalbert Batman, M.D.,  [facility] General and Minimally invasive Surgery Breast and Colorectal Surgery Office:   [phone] Pager:   [phone]

## 2017-01-22 ENCOUNTER — Encounter (HOSPITAL_BASED_OUTPATIENT_CLINIC_OR_DEPARTMENT_OTHER): Payer: Self-pay | Admitting: *Deleted

## 2017-01-22 ENCOUNTER — Ambulatory Visit (HOSPITAL_BASED_OUTPATIENT_CLINIC_OR_DEPARTMENT_OTHER): Payer: BC Managed Care – PPO | Admitting: Anesthesiology

## 2017-01-22 ENCOUNTER — Other Ambulatory Visit: Payer: BC Managed Care – PPO

## 2017-01-22 ENCOUNTER — Encounter (HOSPITAL_BASED_OUTPATIENT_CLINIC_OR_DEPARTMENT_OTHER)
Admission: RE | Disposition: A | Discharge status: Home / Self Care | Payer: Self-pay | Source: Ambulatory Visit | Attending: General Surgery

## 2017-01-22 ENCOUNTER — Ambulatory Visit (HOSPITAL_BASED_OUTPATIENT_CLINIC_OR_DEPARTMENT_OTHER)
Admission: RE | Admit: 2017-01-22 | Discharge: 2017-01-22 | Disposition: A | Discharge status: Home / Self Care | Payer: BC Managed Care – PPO | Source: Ambulatory Visit | Attending: General Surgery | Admitting: General Surgery

## 2017-01-22 DIAGNOSIS — G4730 Sleep apnea, unspecified: Secondary | ICD-10-CM | POA: Insufficient documentation

## 2017-01-22 DIAGNOSIS — N611 Abscess of the breast and nipple: Secondary | ICD-10-CM | POA: Diagnosis present

## 2017-01-22 DIAGNOSIS — Z803 Family history of malignant neoplasm of breast: Secondary | ICD-10-CM | POA: Diagnosis not present

## 2017-01-22 DIAGNOSIS — I10 Essential (primary) hypertension: Secondary | ICD-10-CM | POA: Diagnosis not present

## 2017-01-22 DIAGNOSIS — Z9884 Bariatric surgery status: Secondary | ICD-10-CM | POA: Insufficient documentation

## 2017-01-22 HISTORY — PX: INCISION AND DRAINAGE ABSCESS: SHX5864

## 2017-01-22 HISTORY — DX: Abscess of the breast and nipple: N61.1

## 2017-01-22 SURGERY — INCISION AND DRAINAGE, ABSCESS
Anesthesia: General | Site: Breast | Laterality: Left

## 2017-01-22 MED ORDER — LIDOCAINE-EPINEPHRINE (PF) 1 %-1:200000 IJ SOLN
INTRAMUSCULAR | Status: DC | PRN
  Administered 2017-01-22: 3 mL

## 2017-01-22 MED ORDER — SULFAMETHOXAZOLE-TRIMETHOPRIM 800-160 MG PO TABS: 1.0000 | ORAL_TABLET | Freq: Two times a day (BID) | ORAL | 0 refills | Status: DC

## 2017-01-22 MED ORDER — FENTANYL CITRATE (PF) 100 MCG/2ML IJ SOLN
25.0000 ug | INTRAMUSCULAR | Status: DC | PRN
Start: 2017-01-22 — End: 2017-01-22
  Administered 2017-01-22 (×2): 25 ug via INTRAVENOUS

## 2017-01-22 MED ORDER — PROPOFOL 10 MG/ML IV BOLUS
INTRAVENOUS | Status: DC | PRN
  Administered 2017-01-22: 300 mg via INTRAVENOUS

## 2017-01-22 MED ORDER — CHLORHEXIDINE GLUCONATE CLOTH 2 % EX PADS: 6.0000 | MEDICATED_PAD | Freq: Once | CUTANEOUS | Status: DC

## 2017-01-22 MED ORDER — DIPHENHYDRAMINE HCL 50 MG/ML IJ SOLN
INTRAMUSCULAR | Status: AC
  Filled 2017-01-22: qty 1

## 2017-01-22 MED ORDER — FENTANYL CITRATE (PF) 100 MCG/2ML IJ SOLN
INTRAMUSCULAR | Status: AC
  Filled 2017-01-22: qty 2

## 2017-01-22 MED ORDER — CEFAZOLIN SODIUM-DEXTROSE 2-4 GM/100ML-% IV SOLN
2.0000 g | INTRAVENOUS | Status: AC
  Administered 2017-01-22: 2 g via INTRAVENOUS

## 2017-01-22 MED ORDER — OXYCODONE HCL 5 MG PO TABS: 5.0000 mg | ORAL_TABLET | Freq: Once | ORAL | Status: DC | PRN

## 2017-01-22 MED ORDER — PROPOFOL 10 MG/ML IV BOLUS
INTRAVENOUS | Status: AC
  Filled 2017-01-22: qty 20

## 2017-01-22 MED ORDER — BUPIVACAINE HCL (PF) 0.5 % IJ SOLN
INTRAMUSCULAR | Status: AC
  Filled 2017-01-22: qty 30

## 2017-01-22 MED ORDER — FENTANYL CITRATE (PF) 100 MCG/2ML IJ SOLN
INTRAMUSCULAR | Status: DC | PRN
  Administered 2017-01-22 (×3): 50 ug via INTRAVENOUS

## 2017-01-22 MED ORDER — DIPHENHYDRAMINE HCL 50 MG/ML IJ SOLN
12.5000 mg | Freq: Once | INTRAMUSCULAR | Status: AC
  Administered 2017-01-22: 12.5 mg via INTRAVENOUS

## 2017-01-22 MED ORDER — DIPHENHYDRAMINE HCL 50 MG/ML IJ SOLN
INTRAMUSCULAR | Status: DC | PRN
  Administered 2017-01-22: 12.5 mg via INTRAVENOUS

## 2017-01-22 MED ORDER — HYDROCODONE-ACETAMINOPHEN 5-325 MG PO TABS: 1.0000 | ORAL_TABLET | Freq: Four times a day (QID) | ORAL | 0 refills | Status: DC | PRN

## 2017-01-22 MED ORDER — LIDOCAINE HCL (CARDIAC) 20 MG/ML IV SOLN
INTRAVENOUS | Status: DC | PRN
  Administered 2017-01-22: 100 mg via INTRAVENOUS

## 2017-01-22 MED ORDER — LACTATED RINGERS IV SOLN
INTRAVENOUS | Status: DC
  Administered 2017-01-22: 10:00:00 via INTRAVENOUS

## 2017-01-22 MED ORDER — CEFAZOLIN SODIUM-DEXTROSE 2-4 GM/100ML-% IV SOLN
INTRAVENOUS | Status: AC
  Filled 2017-01-22: qty 100

## 2017-01-22 MED ORDER — ONDANSETRON HCL 4 MG/2ML IJ SOLN
INTRAMUSCULAR | Status: DC | PRN
  Administered 2017-01-22: 4 mg via INTRAVENOUS

## 2017-01-22 MED ORDER — OXYCODONE HCL 5 MG/5ML PO SOLN: 5.0000 mg | Freq: Once | ORAL | Status: DC | PRN

## 2017-01-22 MED ORDER — LIDOCAINE-EPINEPHRINE (PF) 1 %-1:200000 IJ SOLN
INTRAMUSCULAR | Status: AC
  Filled 2017-01-22: qty 30

## 2017-01-22 MED ORDER — ONDANSETRON HCL 4 MG/2ML IJ SOLN: 4.0000 mg | Freq: Four times a day (QID) | INTRAMUSCULAR | Status: DC | PRN

## 2017-01-22 MED ORDER — MIDAZOLAM HCL 5 MG/5ML IJ SOLN
INTRAMUSCULAR | Status: DC | PRN
  Administered 2017-01-22: 2 mg via INTRAVENOUS

## 2017-01-22 MED ORDER — MIDAZOLAM HCL 2 MG/2ML IJ SOLN
INTRAMUSCULAR | Status: AC
  Filled 2017-01-22: qty 2

## 2017-01-22 MED ORDER — DEXAMETHASONE SODIUM PHOSPHATE 4 MG/ML IJ SOLN
INTRAMUSCULAR | Status: DC | PRN
  Administered 2017-01-22: 10 mg via INTRAVENOUS

## 2017-01-22 SURGICAL SUPPLY — 40 items
APL SKNCLS STERI-STRIP NONHPOA (GAUZE/BANDAGES/DRESSINGS)
BENZOIN TINCTURE PRP APPL 2/3 (GAUZE/BANDAGES/DRESSINGS) ×1 IMPLANT
BLADE HEX COATED 2.75 (ELECTRODE) ×3 IMPLANT
BLADE SURG 15 STRL LF DISP TIS (BLADE) ×1 IMPLANT
BLADE SURG 15 STRL SS (BLADE) ×3
CANISTER SUCT 1200ML W/VALVE (MISCELLANEOUS) ×3 IMPLANT
CLOSURE WOUND 1/2 X4 (GAUZE/BANDAGES/DRESSINGS) ×1
COVER BACK TABLE 60X90IN (DRAPES) ×3 IMPLANT
COVER MAYO STAND STRL (DRAPES) ×3 IMPLANT
DECANTER SPIKE VIAL GLASS SM (MISCELLANEOUS) IMPLANT
DRAIN CHANNEL 19F RND (DRAIN) IMPLANT
DRAIN CHANNEL 7F FF FLAT (WOUND CARE) IMPLANT
DRAPE LAPAROTOMY 100X72 PEDS (DRAPES) ×3 IMPLANT
DRAPE UTILITY XL STRL (DRAPES) ×3 IMPLANT
ELECT REM PT RETURN 9FT ADLT (ELECTROSURGICAL) ×3
ELECTRODE REM PT RTRN 9FT ADLT (ELECTROSURGICAL) ×1 IMPLANT
EVACUATOR SILICONE 100CC (DRAIN) IMPLANT
GAUZE SPONGE 4X4 12PLY STRL LF (GAUZE/BANDAGES/DRESSINGS) ×3 IMPLANT
GLOVE EUDERMIC 7 POWDERFREE (GLOVE) ×3 IMPLANT
GOWN STRL REUS W/ TWL LRG LVL3 (GOWN DISPOSABLE) IMPLANT
GOWN STRL REUS W/ TWL XL LVL3 (GOWN DISPOSABLE) ×1 IMPLANT
GOWN STRL REUS W/TWL LRG LVL3 (GOWN DISPOSABLE)
GOWN STRL REUS W/TWL XL LVL3 (GOWN DISPOSABLE) ×6
NDL HYPO 25X1 1.5 SAFETY (NEEDLE) ×1 IMPLANT
NEEDLE HYPO 25X1 1.5 SAFETY (NEEDLE) ×3 IMPLANT
NS IRRIG 1000ML POUR BTL (IV SOLUTION) ×3 IMPLANT
PACK BASIN DAY SURGERY FS (CUSTOM PROCEDURE TRAY) ×3 IMPLANT
PENCIL BUTTON HOLSTER BLD 10FT (ELECTRODE) ×3 IMPLANT
SLEEVE SCD COMPRESS KNEE MED (MISCELLANEOUS) ×2 IMPLANT
STRIP CLOSURE SKIN 1/2X4 (GAUZE/BANDAGES/DRESSINGS) ×2 IMPLANT
SWAB COLLECTION DEVICE MRSA (MISCELLANEOUS) ×3 IMPLANT
SWAB CULTURE ESWAB REG 1ML (MISCELLANEOUS) ×3 IMPLANT
SYR 10ML LL (SYRINGE) ×3 IMPLANT
SYR BULB 3OZ (MISCELLANEOUS) IMPLANT
TOWEL OR 17X24 6PK STRL BLUE (TOWEL DISPOSABLE) ×4 IMPLANT
TOWEL OR NON WOVEN STRL DISP B (DISPOSABLE) ×3 IMPLANT
TRAY DSU PREP LF (CUSTOM PROCEDURE TRAY) IMPLANT
TUBE CONNECTING 20'X1/4 (TUBING) ×1
TUBE CONNECTING 20X1/4 (TUBING) ×2 IMPLANT
YANKAUER SUCT BULB TIP NO VENT (SUCTIONS) ×3 IMPLANT

## 2017-01-22 NOTE — Discharge Instructions (Signed)
Keep the breast wound clean and dry. Ice pack for 10 minutes at a time, intermittently, while awake  You have been given a prescription for Norco for pain You have been given a refill prescription for Bactrim.  Continue to take that  You may return to work next week once the pain has subsided and you do not require any narcotics  Call Dr. Darrel Hoover office this afternoon to arrange a time to see Dr. Dalbert Batman tomorrow, Thursday, 01/23/2017 for the first dressing change We will go over dressing change technique at that time which will  hopefully  not be difficult, considering your medical background.   Post Anesthesia Home Care Instructions  Activity: Get plenty of rest for the remainder of the day. A responsible individual must stay with you for 24 hours following the procedure.  For the next 24 hours, DO NOT: -Drive a car -Paediatric nurse -Drink alcoholic beverages -Take any medication unless instructed by your physician -Make any legal decisions or sign important papers.  Meals: Start with liquid foods such as gelatin or soup. Progress to regular foods as tolerated. Avoid greasy, spicy, heavy foods. If nausea and/or vomiting occur, drink only clear liquids until the nausea and/or vomiting subsides. Call your physician if vomiting continues.  Special Instructions/Symptoms: Your throat may feel dry or sore from the anesthesia or the breathing tube placed in your throat during surgery. If this causes discomfort, gargle with warm salt water. The discomfort should disappear within 24 hours.  If you had a scopolamine patch placed behind your ear for the management of post- operative nausea and/or vomiting:  1. The medication in the patch is effective for 72 hours, after which it should be removed.  Wrap patch in a tissue and discard in the trash. Wash hands thoroughly with soap and water. 2. You may remove the patch earlier than 72 hours if you experience unpleasant side effects which may  include dry mouth, dizziness or visual disturbances. 3. Avoid touching the patch. Wash your hands with soap and water after contact with the patch.

## 2017-01-22 NOTE — Interval H&P Note (Signed)
History and Physical Interval Note:  01/22/2017 10:23 AM  Theresa [name]  has presented today for surgery, with the diagnosis of left breast abscess  The various methods of treatment have been discussed with the patient and family. After consideration of risks, benefits and other options for treatment, the patient has consented to  Procedure(s): INCISION AND DRAINAGE LEFT  BREAST ABSCESS (Left) as a surgical intervention .  The patient's history has been reviewed, patient examined, no change in status, stable for surgery.  I have reviewed the patient's chart and labs.  Questions were answered to the patient's satisfaction.     Adin Hector

## 2017-01-22 NOTE — Transfer of Care (Signed)
Immediate Anesthesia Transfer of Care Note  Patient: Theresa [name]  Procedure(s) Performed: Procedure(s): INCISION AND DRAINAGE LEFT  BREAST ABSCESS (Left)  Patient Location: PACU  Anesthesia Type:General  Level of Consciousness: awake, alert  and oriented  Airway & Oxygen Therapy: Patient Spontanous Breathing and Patient connected to face mask oxygen  Post-op Assessment: Report given to RN and Post -op Vital signs reviewed and stable  Post vital signs: Reviewed and stable  Last Vitals:  Vitals:   01/22/17 1113 01/22/17 1114  BP: 136/90   Pulse: 98 95  Resp:  14  Temp:      Last Pain:  Vitals:   01/22/17 1018  TempSrc: Oral         Complications: No apparent anesthesia complications

## 2017-01-22 NOTE — Anesthesia Procedure Notes (Signed)
Procedure Name: LMA Insertion Date/Time: 01/22/2017 10:45 AM Performed by: Melynda Ripple D Pre-anesthesia Checklist: Patient identified, Emergency Drugs available, Suction available and Patient being monitored Patient Re-evaluated:Patient Re-evaluated prior to inductionOxygen Delivery Method: Circle system utilized Preoxygenation: Pre-oxygenation with 100% oxygen Intubation Type: IV induction Ventilation: Mask ventilation without difficulty LMA: LMA inserted LMA Size: 4.0 Number of attempts: 1 Airway Equipment and Method: Bite block Placement Confirmation: positive ETCO2 Tube secured with: Tape Dental Injury: Teeth and Oropharynx as per pre-operative assessment

## 2017-01-22 NOTE — Anesthesia Preprocedure Evaluation (Signed)
Anesthesia Evaluation  Patient identified by MRN, date of birth, ID band Patient awake    Reviewed: Allergy & Precautions, H&P , NPO status , Patient's Chart, lab work & pertinent test results  Airway Mallampati: II   Neck ROM: full    Dental   Pulmonary sleep apnea ,    breath sounds clear to auscultation       Cardiovascular hypertension,  Rhythm:regular Rate:Normal     Neuro/Psych PSYCHIATRIC DISORDERS Bipolar Disorder    GI/Hepatic GERD  ,  Endo/Other    Renal/GU      Musculoskeletal   Abdominal   Peds  Hematology   Anesthesia Other Findings   Reproductive/Obstetrics                             Anesthesia Physical Anesthesia Plan  ASA: II  Anesthesia Plan: General   Post-op Pain Management:    Induction: Intravenous  Airway Management Planned: LMA  Additional Equipment:   Intra-op Plan:   Post-operative Plan:   Informed Consent: I have reviewed the patients History and Physical, chart, labs and discussed the procedure including the risks, benefits and alternatives for the proposed anesthesia with the patient or authorized representative who has indicated his/her understanding and acceptance.     Plan Discussed with: CRNA, Anesthesiologist and Surgeon  Anesthesia Plan Comments:         Anesthesia Quick Evaluation

## 2017-01-22 NOTE — Op Note (Signed)
Patient Name:           Theresa [name]   Date of Surgery:        01/22/2017  Pre op Diagnosis:      Left breast abscess  Post op Diagnosis:    Same  Procedure:                 Incision and drainage of left breast abscess  Surgeon:                     Edsel Petrin. Dalbert Batman, M.D., FACS  Assistant:                      OR staff   Indication for Assistant: n/a  Operative Indications:    The patient is a 46 year old female who presents with a breast abscess. This is a 46 year old female who presents for surgical management of an abscess of the left breast at the 9 o'clock position. She is referred by Dr. Evangeline Dakin at the [facility] [place].     She has a history of bilateral augmentation mammoplasty by Dr. Lenon Curt a year and a half ago. Subglandular. No other breast problems. She has bilateral nipple jewelry. She has 10-12 days of pain and redness in the medial left breast. 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 They put her on Bactrim.     This past Friday, 5 days ago she was seen at the [facility] [place]. Needle aspiration revealed 1 cc of pus. The mass has gotten bigger. The next day she was admitted to [facility] for 24 hours and was placed on IV vancomycin. She says she was only seen by radiology. I can see that an ultrasound was done and [facility] revealing a 3.2 cm mass with a sinus tract heading toward the skin. They felt this was an abscess. No one is able to aspirate this and she is referred for surgical drainage.     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 Hysterectomy without BSO. Tubal ligation. C-section. Bilateral subglandular augmentation mammoplasty. ADD. Hypertension. Sleep apnea. BMI about 27.9. Family history reveals mother had breast cancer. 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 Separated with 4 children. Denies tobacco. Alcohol occasionally.     We are going to schedule her for surgery on 01/22/2017.. Incision and drainage left breast abscess. Plan transverse radially oriented incision left breast at 9 o'clock position. Hopefully we can avoid injuring the implant. Told her we would leave this wound open and she would have a scar. I discussed the indications, details, techniques, and numerous risk of the surgery. She is aware the risk of bleeding, infection, recurrent infection, nerve damage with chronic pain or numbness, injury and removal of the implant. She understands all these issues. All of her questions were answered. She agrees with this plan.  Operative Findings:       There was a 3.5 cm deep abscess.  This was unilocular.  The  fluid was thick and creamy but without odor.  It did not involve the implant.  I did not see the implant.  Cultures were taken.  Procedure in Detail:          Following the induction of general LMA anesthesia the patient's left breast  was prepped and draped in a sterile fashion.  Surgical timeout was performed.  Intravenous antibiotics were given after the cultures were taken.  1% Xylocaine with epinephrine was used as local infiltration anesthetic after the wound was opened.  Transverse incision was made in the left breast at the 9:00 position.  Using cautery I slowly dissected down to the abscess cavity and then evacuated the abscess and took aerobic and anaerobic cultures.  The wound was irrigated.  This appeared to be a unilocular abscess.  It was explored digitally.  Hemostasis was excellent.  The wound was packed with 1/2 inch iodoform gauze.  4 x 4's and ABDs and a breast binder were placed.  The patient tolerated the procedure well was taken to PACU in stable condition.  EBL 15 mL.  Counts correct.  Complications none.     Edsel Petrin. Dalbert Batman, M.D., FACS General and Minimally  Invasive Surgery Breast and Colorectal Surgery  Addendum: I logged onto the [facility] website and reviewed her prescription medication history  01/22/2017 11:08 AM

## 2017-01-22 NOTE — Anesthesia Postprocedure Evaluation (Signed)
Anesthesia Post Note  Patient: Theresa [name]  Procedure(s) Performed: Procedure(s) (LRB): INCISION AND DRAINAGE LEFT  BREAST ABSCESS (Left)  Patient location during evaluation: PACU Anesthesia Type: General Level of consciousness: awake and alert Pain management: pain level controlled Vital Signs Assessment: post-procedure vital signs reviewed and stable Respiratory status: spontaneous breathing, nonlabored ventilation, respiratory function stable and patient connected to nasal cannula oxygen Cardiovascular status: blood pressure returned to baseline and stable Postop Assessment: no signs of nausea or vomiting Anesthetic complications: no       Last Vitals:  Vitals:   01/22/17 1200 01/22/17 1235  BP: 135/86 (!) 136/92  Pulse: 79 78  Resp: (!) 22 16  Temp:  36.6 C    Last Pain:  Vitals:   01/22/17 1235  TempSrc: Oral  PainSc: 3                  Montez Hageman

## 2017-01-24 ENCOUNTER — Encounter (HOSPITAL_BASED_OUTPATIENT_CLINIC_OR_DEPARTMENT_OTHER): Payer: Self-pay | Admitting: General Surgery

## 2017-01-27 LAB — AEROBIC/ANAEROBIC CULTURE W GRAM STAIN (SURGICAL/DEEP WOUND)

## 2017-01-27 LAB — AEROBIC/ANAEROBIC CULTURE (SURGICAL/DEEP WOUND)

## 2017-01-28 NOTE — H&P (Signed)
Theresa [name]  DICTATION # [id number] CSN# [id number]   Margarette Asal, MD 01/28/2017 10:41 AM

## 2017-01-29 NOTE — H&P (Signed)
Theresa [name], Theresa [name]              ACCOUNT NO.:  [id number]  MEDICAL RECORD NO.:  [id number]  LOCATION:                                 FACILITY:  PHYSICIAN:  Ralene Bathe. Matthew [name], M.D.    DATE OF BIRTH:  DATE OF ADMISSION:  02/18/2017 DATE OF DISCHARGE:                             HISTORY & PHYSICAL   CHIEF COMPLAINT:  Pelvic pain.  HISTORY OF PRESENT ILLNESS:  A 46 year old, G4, P4.  This patient underwent a TVH, right salpingectomy in 2016 for menorrhagia, dysmenorrhea, adenomyosis, and failed ablation.  The right tube was removed at that time because it appeared to be hydrosalpinx.  She did well initially but has continued to struggle with severe pelvic pain and pain during intercourse mid and to the left.  On 03/18, ultrasound in our office, no free fluid, no masses were noted.  On 03/18, CT scan showed no acute process, status post gastric bypass.  Note that after she had weight loss from her gastric bypass, she did have an abdominoplasty.  Due to concerns about suspected scar tissue or the need for laparotomy, decision made to proceed with mini laparotomy with BSO for continued problems related to pelvic pain and suspected adnexal adhesions.  This procedure including specific risks of bleeding, infection, adjacent organ injury, wound infection, phlebitis along with her expected recovery time, or the need for ERT, all discussed which she understands and accepts.  More recently, she has had a breast abscess that has been drained both per Surgery and Interventional Radiology has finally responded to antibiotics.  PAST SURGICAL HISTORY:  Tubal ligation, cesarean section, gastric bypass, abdominoplasty, breast augmentation, TVH, right salpingectomy.  FAMILY HISTORY:  Significant for headache, heart disease, epilepsy, diabetes, hypertension, breast and colon cancer.  SOCIAL HISTORY:  Denies drug or tobacco use.  She does drink alcohol socially.  She is separated.  PHYSICAL  EXAMINATION:  VITAL SIGNS:  Temperature 98.2, blood pressure 120/68. HEENT:  Unremarkable. NECK:  Supple without masses. LUNGS:  Clear. CARDIOVASCULAR:  Regular rate and rhythm, without murmurs, rubs, gallops noted. BREASTS:  Revealed implants. ABDOMEN:  Flat from her prior abdominal plasty. GU:  Vulva, vaginal cuff unremarkable.  Bimanual reveals no unusual masses or nodularity. EXTREMITIES:  Unremarkable. NEUROLOGIC:  Unremarkable.  Pap of the cuff, 05/18, atypical cells with yeast noted.  She was treated with Diflucan at that time.  IMPRESSION:  Pelvic pain, dyspareunia, suspect adnexal adhesions.  PLAN:  Laparotomy with lysis of adhesions, BSO.  Procedure and risks discussed as above.     Theresa [name], M.D.     RMH/MEDQ  D:  01/28/2017  T:  01/28/2017  Job:  [id number]

## 2017-02-10 ENCOUNTER — Inpatient Hospital Stay (HOSPITAL_COMMUNITY)
Admission: RE | Admit: 2017-02-10 | Discharge: 2017-02-10 | Disposition: A | Discharge status: Home / Self Care | Payer: BC Managed Care – PPO | Source: Ambulatory Visit

## 2017-02-10 NOTE — Patient Instructions (Signed)
Your procedure is scheduled on:  Tuesday, February 18, 2017  Enter through the Main Entrance of [facility] at:  6:00 AM  Pick up the phone at the desk and dial [phone].  Call this number if you have problems the morning of surgery: [phone].  Remember: Do NOT eat food or drink after:  Midnight Monday  Take these medicines the morning of surgery with a SIP OF WATER:  None  Stop ALL herbal medications at this time  Do NOT smoke the day of surgery.  Do NOT wear jewelry (body piercing), metal hair clips/bobby pins, make-up, or nail polish. Do NOT wear lotions, powders, or perfumes.  You may wear deodorant. Do NOT shave for 48 hours prior to surgery. Do NOT bring valuables to the hospital. Contacts, dentures, or bridgework may not be worn into surgery.  Leave suitcase in car.  After surgery it may be brought to your room.  For patients admitted to the hospital, checkout time is 11:00 AM the day of discharge.  Bring a copy of your healthcare power of attorney and living will documents.

## 2017-02-12 NOTE — Patient Instructions (Signed)
Your procedure is scheduled on:  Tuesday, February 18, 2017  Enter through the Main Entrance of [facility] at:  6:00 AM  Pick up the phone at the desk and dial [phone].  Call this number if you have problems the morning of surgery: [phone].  Remember: Do NOT eat food or drink after:  Midnight Monday  Take these medicines the morning of surgery with a SIP OF WATER:  None  Stop ALL herbal medications at this time  Do NOT smoke the day of surgery.  Do NOT wear jewelry (body piercing), metal hair clips/bobby pins, make-up, or nail polish. Do NOT wear lotions, powders, or perfumes.  You may wear deodorant. Do NOT shave for 48 hours prior to surgery. Do NOT bring valuables to the hospital. Contacts, dentures, or bridgework may not be worn into surgery.  Leave suitcase in car.  After surgery it may be brought to your room.  For patients admitted to the hospital, checkout time is 11:00 AM the day of discharge.  Bring a copy of your healthcare power of attorney and living will documents.

## 2017-02-13 ENCOUNTER — Encounter (HOSPITAL_COMMUNITY): Payer: Self-pay

## 2017-02-13 ENCOUNTER — Encounter (HOSPITAL_COMMUNITY)
Admission: RE | Admit: 2017-02-13 | Discharge: 2017-02-13 | Disposition: A | Discharge status: Home / Self Care | Payer: BC Managed Care – PPO | Source: Ambulatory Visit | Attending: Obstetrics and Gynecology | Admitting: Obstetrics and Gynecology

## 2017-02-13 DIAGNOSIS — Z0183 Encounter for blood typing: Secondary | ICD-10-CM | POA: Diagnosis not present

## 2017-02-13 DIAGNOSIS — Z01812 Encounter for preprocedural laboratory examination: Secondary | ICD-10-CM | POA: Insufficient documentation

## 2017-02-13 DIAGNOSIS — R1032 Left lower quadrant pain: Secondary | ICD-10-CM | POA: Diagnosis not present

## 2017-02-13 HISTORY — DX: Hypoglycemia, unspecified: E16.2

## 2017-02-13 LAB — TYPE AND SCREEN
ABO/RH(D): O POS
ANTIBODY SCREEN: NEGATIVE

## 2017-02-13 LAB — CBC
HCT: 39.3 % (ref 36.0–46.0)
Hemoglobin: 13.1 g/dL (ref 12.0–15.0)
MCH: 30.4 pg (ref 26.0–34.0)
MCHC: 33.3 g/dL (ref 30.0–36.0)
MCV: 91.2 fL (ref 78.0–100.0)
PLATELETS: 202 10*3/uL (ref 150–400)
RBC: 4.31 MIL/uL (ref 3.87–5.11)
RDW: 12.4 % (ref 11.5–15.5)
WBC: 5.6 10*3/uL (ref 4.0–10.5)

## 2017-02-13 LAB — ABO/RH: ABO/RH(D): O POS

## 2017-02-18 ENCOUNTER — Encounter (HOSPITAL_COMMUNITY): Payer: Self-pay

## 2017-02-18 ENCOUNTER — Encounter (HOSPITAL_COMMUNITY)
Admission: RE | Disposition: A | Discharge status: Home / Self Care | Payer: Self-pay | Source: Ambulatory Visit | Attending: Obstetrics and Gynecology

## 2017-02-18 ENCOUNTER — Inpatient Hospital Stay (HOSPITAL_COMMUNITY): Payer: BC Managed Care – PPO | Admitting: Anesthesiology

## 2017-02-18 ENCOUNTER — Inpatient Hospital Stay (HOSPITAL_COMMUNITY)
Admission: RE | Admit: 2017-02-18 | Discharge: 2017-02-19 | DRG: 743 | Disposition: A | Discharge status: Home / Self Care | Payer: BC Managed Care – PPO | Source: Ambulatory Visit | Attending: Obstetrics and Gynecology | Admitting: Obstetrics and Gynecology

## 2017-02-18 DIAGNOSIS — Z9884 Bariatric surgery status: Secondary | ICD-10-CM | POA: Diagnosis not present

## 2017-02-18 DIAGNOSIS — N7011 Chronic salpingitis: Principal | ICD-10-CM | POA: Diagnosis present

## 2017-02-18 DIAGNOSIS — N941 Unspecified dyspareunia: Secondary | ICD-10-CM | POA: Diagnosis present

## 2017-02-18 DIAGNOSIS — G8929 Other chronic pain: Secondary | ICD-10-CM | POA: Diagnosis present

## 2017-02-18 DIAGNOSIS — R102 Pelvic and perineal pain: Secondary | ICD-10-CM | POA: Diagnosis present

## 2017-02-18 DIAGNOSIS — M792 Neuralgia and neuritis, unspecified: Secondary | ICD-10-CM | POA: Diagnosis present

## 2017-02-18 HISTORY — PX: SALPINGOOPHORECTOMY: SHX82

## 2017-02-18 HISTORY — PX: LAPAROTOMY: SHX154

## 2017-02-18 SURGERY — LAPAROTOMY
Anesthesia: General | Site: Abdomen

## 2017-02-18 MED ORDER — ACETAMINOPHEN 500 MG PO TABS
1000.0000 mg | ORAL_TABLET | Freq: Once | ORAL | Status: AC
  Administered 2017-02-18: 1000 mg via ORAL

## 2017-02-18 MED ORDER — HYDROMORPHONE HCL 1 MG/ML IJ SOLN
0.2500 mg | INTRAMUSCULAR | Status: DC | PRN
  Administered 2017-02-18 (×5): 0.5 mg via INTRAVENOUS

## 2017-02-18 MED ORDER — KETOROLAC TROMETHAMINE 30 MG/ML IJ SOLN
30.0000 mg | Freq: Four times a day (QID) | INTRAMUSCULAR | Status: DC
  Administered 2017-02-18 – 2017-02-19 (×5): 30 mg via INTRAVENOUS
  Filled 2017-02-18 (×5): qty 1

## 2017-02-18 MED ORDER — ONDANSETRON HCL 4 MG/2ML IJ SOLN: 4.0000 mg | Freq: Four times a day (QID) | INTRAMUSCULAR | Status: DC | PRN

## 2017-02-18 MED ORDER — SCOPOLAMINE 1 MG/3DAYS TD PT72
1.0000 | MEDICATED_PATCH | Freq: Once | TRANSDERMAL | Status: DC
  Administered 2017-02-18: 1.5 mg via TRANSDERMAL

## 2017-02-18 MED ORDER — DIPHENHYDRAMINE HCL 50 MG/ML IJ SOLN
INTRAMUSCULAR | Status: DC | PRN
  Administered 2017-02-18: 12.5 mg via INTRAVENOUS

## 2017-02-18 MED ORDER — HYDROMORPHONE HCL 1 MG/ML IJ SOLN
0.5000 mg | Freq: Once | INTRAMUSCULAR | Status: AC
  Administered 2017-02-18: 0.5 mg via INTRAVENOUS

## 2017-02-18 MED ORDER — FENTANYL CITRATE (PF) 250 MCG/5ML IJ SOLN
INTRAMUSCULAR | Status: DC | PRN
  Administered 2017-02-18 (×3): 50 ug via INTRAVENOUS
  Administered 2017-02-18 (×2): 100 ug via INTRAVENOUS

## 2017-02-18 MED ORDER — CELECOXIB 200 MG PO CAPS
200.0000 mg | ORAL_CAPSULE | Freq: Once | ORAL | Status: AC
  Administered 2017-02-18: 200 mg via ORAL

## 2017-02-18 MED ORDER — HYDROMORPHONE HCL 1 MG/ML IJ SOLN
INTRAMUSCULAR | Status: AC
  Administered 2017-02-18: 0.5 mg via INTRAVENOUS
  Filled 2017-02-18: qty 1

## 2017-02-18 MED ORDER — DIPHENHYDRAMINE HCL 50 MG/ML IJ SOLN: 12.5000 mg | Freq: Four times a day (QID) | INTRAMUSCULAR | Status: DC | PRN

## 2017-02-18 MED ORDER — CEFOTETAN DISODIUM-DEXTROSE 2-2.08 GM-% IV SOLR: 2.0000 g | INTRAVENOUS | Status: DC

## 2017-02-18 MED ORDER — KETOROLAC TROMETHAMINE 30 MG/ML IJ SOLN
INTRAMUSCULAR | Status: AC
  Filled 2017-02-18: qty 1

## 2017-02-18 MED ORDER — FENTANYL CITRATE (PF) 250 MCG/5ML IJ SOLN
INTRAMUSCULAR | Status: AC
  Filled 2017-02-18: qty 5

## 2017-02-18 MED ORDER — PROPOFOL 10 MG/ML IV BOLUS
INTRAVENOUS | Status: AC
  Filled 2017-02-18: qty 20

## 2017-02-18 MED ORDER — MORPHINE SULFATE 2 MG/ML IV SOLN: INTRAVENOUS | Status: DC

## 2017-02-18 MED ORDER — FENTANYL CITRATE (PF) 100 MCG/2ML IJ SOLN
INTRAMUSCULAR | Status: AC
  Filled 2017-02-18: qty 2

## 2017-02-18 MED ORDER — ROCURONIUM BROMIDE 100 MG/10ML IV SOLN
INTRAVENOUS | Status: DC | PRN
  Administered 2017-02-18: 50 mg via INTRAVENOUS

## 2017-02-18 MED ORDER — MIDAZOLAM HCL 2 MG/2ML IJ SOLN
INTRAMUSCULAR | Status: AC
  Filled 2017-02-18: qty 2

## 2017-02-18 MED ORDER — KETOROLAC TROMETHAMINE 30 MG/ML IJ SOLN: 30.0000 mg | Freq: Once | INTRAMUSCULAR | Status: DC

## 2017-02-18 MED ORDER — LIDOCAINE HCL (CARDIAC) 20 MG/ML IV SOLN
INTRAVENOUS | Status: AC
  Filled 2017-02-18: qty 5

## 2017-02-18 MED ORDER — BUPIVACAINE HCL (PF) 0.25 % IJ SOLN
INTRAMUSCULAR | Status: AC
  Filled 2017-02-18: qty 30

## 2017-02-18 MED ORDER — LACTATED RINGERS IV SOLN
INTRAVENOUS | Status: DC
  Administered 2017-02-18: 08:00:00 via INTRAVENOUS
  Administered 2017-02-18 (×2): 125 mL/h via INTRAVENOUS

## 2017-02-18 MED ORDER — ONDANSETRON HCL 4 MG/2ML IJ SOLN
INTRAMUSCULAR | Status: DC | PRN
  Administered 2017-02-18: 4 mg via INTRAVENOUS

## 2017-02-18 MED ORDER — NALOXONE HCL 0.4 MG/ML IJ SOLN: 0.4000 mg | INTRAMUSCULAR | Status: DC | PRN

## 2017-02-18 MED ORDER — DEXTROSE IN LACTATED RINGERS 5 % IV SOLN: INTRAVENOUS | Status: DC

## 2017-02-18 MED ORDER — SODIUM CHLORIDE 0.9 % IV SOLN: 0.5000 mg/h | INTRAVENOUS | Status: DC

## 2017-02-18 MED ORDER — BUTORPHANOL TARTRATE 1 MG/ML IJ SOLN: 1.0000 mg | INTRAMUSCULAR | Status: DC | PRN

## 2017-02-18 MED ORDER — MORPHINE SULFATE 2 MG/ML IV SOLN
INTRAVENOUS | Status: DC
  Administered 2017-02-18: 13:00:00 via INTRAVENOUS
  Filled 2017-02-18: qty 30

## 2017-02-18 MED ORDER — LIDOCAINE HCL (CARDIAC) 20 MG/ML IV SOLN
INTRAVENOUS | Status: DC | PRN
  Administered 2017-02-18: 60 mg via INTRATRACHEAL
  Administered 2017-02-18: 40 mg via INTRAVENOUS

## 2017-02-18 MED ORDER — FENTANYL CITRATE (PF) 100 MCG/2ML IJ SOLN
INTRAMUSCULAR | Status: AC
  Administered 2017-02-18: 50 ug via INTRAVENOUS
  Filled 2017-02-18: qty 2

## 2017-02-18 MED ORDER — DIPHENHYDRAMINE HCL 12.5 MG/5ML PO ELIX
12.5000 mg | ORAL_SOLUTION | Freq: Four times a day (QID) | ORAL | Status: DC | PRN
  Filled 2017-02-18: qty 5

## 2017-02-18 MED ORDER — KETOROLAC TROMETHAMINE 30 MG/ML IJ SOLN: 30.0000 mg | Freq: Four times a day (QID) | INTRAMUSCULAR | Status: DC

## 2017-02-18 MED ORDER — ONDANSETRON HCL 4 MG PO TABS: 4.0000 mg | ORAL_TABLET | Freq: Four times a day (QID) | ORAL | Status: DC | PRN

## 2017-02-18 MED ORDER — MORPHINE SULFATE 2 MG/ML IV SOLN
INTRAVENOUS | Status: DC
  Administered 2017-02-18: 1.5 mg via INTRAVENOUS
  Administered 2017-02-18: 13.5 mg via INTRAVENOUS
  Filled 2017-02-18: qty 30

## 2017-02-18 MED ORDER — PROMETHAZINE HCL 25 MG/ML IJ SOLN: 6.2500 mg | INTRAMUSCULAR | Status: DC | PRN

## 2017-02-18 MED ORDER — DIPHENHYDRAMINE HCL 12.5 MG/5ML PO ELIX
12.5000 mg | ORAL_SOLUTION | Freq: Four times a day (QID) | ORAL | Status: DC | PRN
  Administered 2017-02-18: 12.5 mg via ORAL

## 2017-02-18 MED ORDER — CEFOTETAN DISODIUM-DEXTROSE 2-2.08 GM-% IV SOLR
2.0000 g | INTRAVENOUS | Status: AC
  Administered 2017-02-18: 2 g via INTRAVENOUS

## 2017-02-18 MED ORDER — LIDOCAINE HCL 1 % IJ SOLN
INTRAMUSCULAR | Status: AC
  Filled 2017-02-18: qty 20

## 2017-02-18 MED ORDER — DEXAMETHASONE SODIUM PHOSPHATE 10 MG/ML IJ SOLN
INTRAMUSCULAR | Status: DC | PRN
  Administered 2017-02-18: 10 mg via INTRAVENOUS

## 2017-02-18 MED ORDER — SUGAMMADEX SODIUM 200 MG/2ML IV SOLN
INTRAVENOUS | Status: DC | PRN
  Administered 2017-02-18: 200 mg via INTRAVENOUS

## 2017-02-18 MED ORDER — GABAPENTIN 300 MG PO CAPS
300.0000 mg | ORAL_CAPSULE | Freq: Once | ORAL | Status: AC
  Administered 2017-02-18: 300 mg via ORAL
  Filled 2017-02-18: qty 1

## 2017-02-18 MED ORDER — CEFOTETAN DISODIUM-DEXTROSE 2-2.08 GM-% IV SOLR
INTRAVENOUS | Status: AC
  Filled 2017-02-18: qty 50

## 2017-02-18 MED ORDER — DEXAMETHASONE SODIUM PHOSPHATE 10 MG/ML IJ SOLN
INTRAMUSCULAR | Status: AC
  Filled 2017-02-18: qty 1

## 2017-02-18 MED ORDER — PROPOFOL 10 MG/ML IV BOLUS
INTRAVENOUS | Status: DC | PRN
  Administered 2017-02-18: 150 mg via INTRAVENOUS

## 2017-02-18 MED ORDER — SCOPOLAMINE 1 MG/3DAYS TD PT72
MEDICATED_PATCH | TRANSDERMAL | Status: AC
  Administered 2017-02-18: 1.5 mg via TRANSDERMAL
  Filled 2017-02-18: qty 1

## 2017-02-18 MED ORDER — SODIUM CHLORIDE 0.9% FLUSH: 9.0000 mL | INTRAVENOUS | Status: DC | PRN

## 2017-02-18 MED ORDER — ONDANSETRON HCL 4 MG/2ML IJ SOLN
INTRAMUSCULAR | Status: AC
  Filled 2017-02-18: qty 2

## 2017-02-18 MED ORDER — ACETAMINOPHEN 500 MG PO TABS
ORAL_TABLET | ORAL | Status: AC
  Filled 2017-02-18: qty 2

## 2017-02-18 MED ORDER — GABAPENTIN 400 MG PO CAPS
ORAL_CAPSULE | ORAL | Status: AC
  Administered 2017-02-18: 300 mg via ORAL
  Filled 2017-02-18: qty 1

## 2017-02-18 MED ORDER — GABAPENTIN 300 MG PO CAPS
ORAL_CAPSULE | ORAL | Status: AC
  Filled 2017-02-18: qty 1

## 2017-02-18 MED ORDER — DIPHENHYDRAMINE HCL 25 MG PO CAPS
50.0000 mg | ORAL_CAPSULE | Freq: Three times a day (TID) | ORAL | Status: DC | PRN
  Administered 2017-02-18 – 2017-02-19 (×2): 50 mg via ORAL
  Filled 2017-02-18 (×3): qty 2

## 2017-02-18 MED ORDER — BUPIVACAINE LIPOSOME 1.3 % IJ SUSP
20.0000 mL | Freq: Once | INTRAMUSCULAR | Status: AC
  Administered 2017-02-18: 20 mL
  Filled 2017-02-18: qty 20

## 2017-02-18 MED ORDER — MIDAZOLAM HCL 5 MG/5ML IJ SOLN
INTRAMUSCULAR | Status: DC | PRN
  Administered 2017-02-18: 2 mg via INTRAVENOUS

## 2017-02-18 MED ORDER — IBUPROFEN 800 MG PO TABS: 800.0000 mg | ORAL_TABLET | Freq: Three times a day (TID) | ORAL | Status: DC | PRN

## 2017-02-18 MED ORDER — SODIUM CHLORIDE 0.9 % IJ SOLN
INTRAMUSCULAR | Status: AC
  Filled 2017-02-18: qty 10

## 2017-02-18 MED ORDER — FENTANYL CITRATE (PF) 100 MCG/2ML IJ SOLN
25.0000 ug | INTRAMUSCULAR | Status: DC | PRN
  Administered 2017-02-18 (×3): 50 ug via INTRAVENOUS

## 2017-02-18 MED ORDER — OXYCODONE-ACETAMINOPHEN 5-325 MG PO TABS
1.0000 | ORAL_TABLET | ORAL | Status: DC | PRN
  Administered 2017-02-18 – 2017-02-19 (×3): 2 via ORAL
  Filled 2017-02-18 (×5): qty 2

## 2017-02-18 MED ORDER — SENNOSIDES-DOCUSATE SODIUM 8.6-50 MG PO TABS
1.0000 | ORAL_TABLET | Freq: Every day | ORAL | Status: DC
  Administered 2017-02-18: 1 via ORAL
  Filled 2017-02-18: qty 1

## 2017-02-18 MED ORDER — HYDROMORPHONE HCL 1 MG/ML IJ SOLN
INTRAMUSCULAR | Status: AC
  Filled 2017-02-18: qty 1

## 2017-02-18 MED ORDER — MENTHOL 3 MG MT LOZG: 1.0000 | LOZENGE | OROMUCOSAL | Status: DC | PRN

## 2017-02-18 MED ORDER — DIPHENHYDRAMINE HCL 50 MG/ML IJ SOLN
INTRAMUSCULAR | Status: AC
  Filled 2017-02-18: qty 1

## 2017-02-18 MED ORDER — DIPHENHYDRAMINE HCL 50 MG/ML IJ SOLN
37.5000 mg | Freq: Once | INTRAMUSCULAR | Status: AC
  Administered 2017-02-18: 37.5 mg via INTRAVENOUS
  Filled 2017-02-18: qty 1

## 2017-02-18 MED ORDER — CELECOXIB 200 MG PO CAPS
ORAL_CAPSULE | ORAL | Status: AC
  Filled 2017-02-18: qty 1

## 2017-02-18 MED ORDER — SODIUM CHLORIDE 0.9 % IJ SOLN
INTRAMUSCULAR | Status: AC
  Filled 2017-02-18: qty 20

## 2017-02-18 SURGICAL SUPPLY — 37 items
APL SKNCLS STERI-STRIP NONHPOA (GAUZE/BANDAGES/DRESSINGS) ×2
BENZOIN TINCTURE PRP APPL 2/3 (GAUZE/BANDAGES/DRESSINGS) ×4 IMPLANT
CANISTER SUCT 3000ML PPV (MISCELLANEOUS) ×4 IMPLANT
CELLS DAT CNTRL 66122 CELL SVR (MISCELLANEOUS) ×2 IMPLANT
CLOSURE WOUND 1/2 X4 (GAUZE/BANDAGES/DRESSINGS) ×1
CLOTH BEACON ORANGE TIMEOUT ST (SAFETY) ×4 IMPLANT
CONTAINER PREFILL 10% NBF 60ML (FORM) IMPLANT
DECANTER SPIKE VIAL GLASS SM (MISCELLANEOUS) ×4 IMPLANT
DRSG OPSITE POSTOP 4X10 (GAUZE/BANDAGES/DRESSINGS) ×2 IMPLANT
DURAPREP 26ML APPLICATOR (WOUND CARE) ×4 IMPLANT
GAUZE SPONGE 4X4 16PLY XRAY LF (GAUZE/BANDAGES/DRESSINGS) ×2 IMPLANT
GLOVE BIO SURGEON STRL SZ7 (GLOVE) ×4 IMPLANT
GLOVE BIOGEL PI IND STRL 7.0 (GLOVE) ×2 IMPLANT
GLOVE BIOGEL PI INDICATOR 7.0 (GLOVE) ×2
GOWN STRL REUS W/TWL LRG LVL3 (GOWN DISPOSABLE) ×12 IMPLANT
NEEDLE HYPO 22GX1.5 SAFETY (NEEDLE) IMPLANT
NS IRRIG 1000ML POUR BTL (IV SOLUTION) ×4 IMPLANT
PACK ABDOMINAL GYN (CUSTOM PROCEDURE TRAY) ×4 IMPLANT
PAD OB MATERNITY 4.3X12.25 (PERSONAL CARE ITEMS) ×4 IMPLANT
PROTECTOR NERVE ULNAR (MISCELLANEOUS) ×6 IMPLANT
RETRACTOR WND ALEXIS 18 MED (MISCELLANEOUS) IMPLANT
RINGERS IRRIG 1000ML POUR BTL (IV SOLUTION) ×4 IMPLANT
RTRCTR WOUND ALEXIS 18CM MED (MISCELLANEOUS) ×4
SPONGE GAUZE 4X4 12PLY STER LF (GAUZE/BANDAGES/DRESSINGS) ×8 IMPLANT
SPONGE LAP 18X18 X RAY DECT (DISPOSABLE) ×2 IMPLANT
STAPLER VISISTAT 35W (STAPLE) ×4 IMPLANT
STRIP CLOSURE SKIN 1/2X4 (GAUZE/BANDAGES/DRESSINGS) ×1 IMPLANT
SUT CHROMIC 0 CT 1 (SUTURE) ×4 IMPLANT
SUT PDS AB 0 CT1 27 (SUTURE) ×8 IMPLANT
SUT VIC AB 0 CT1 18XCR BRD8 (SUTURE) ×2 IMPLANT
SUT VIC AB 0 CT1 8-18 (SUTURE) ×4
SUT VIC AB 3-0 CT1 27 (SUTURE) ×4
SUT VIC AB 3-0 CT1 TAPERPNT 27 (SUTURE) ×2 IMPLANT
SUT VICRYL 0 TIES 12 18 (SUTURE) ×4 IMPLANT
SYR CONTROL 10ML LL (SYRINGE) ×2 IMPLANT
TOWEL OR 17X24 6PK STRL BLUE (TOWEL DISPOSABLE) ×8 IMPLANT
TRAY FOLEY CATH SILVER 14FR (SET/KITS/TRAYS/PACK) ×4 IMPLANT

## 2017-02-18 NOTE — Transfer of Care (Signed)
Immediate Anesthesia Transfer of Care Note  Patient: Theresa [name]  Procedure(s) Performed: Procedure(s): LAPAROTOMY (N/A) BILATERAL SALPINGO OOPHORECTOMY (Bilateral)  Patient Location: PACU  Anesthesia Type:General  Level of Consciousness: awake  Airway & Oxygen Therapy: Patient Spontanous Breathing and Patient connected to nasal cannula oxygen  Post-op Assessment: Report given to RN and Post -op Vital signs reviewed and stable  Post vital signs: stable  Last Vitals:  Vitals:   02/18/17 0627  BP: (!) 135/93  Pulse: 66  Resp: 16  Temp: 36.5 C    Last Pain:  Vitals:   02/18/17 0627  TempSrc: Oral  PainSc: 5          Complications: No apparent anesthesia complications

## 2017-02-18 NOTE — Op Note (Signed)
Preoperative diagnosis: Acute and chronic pelvic pain  Postoperative diagnosis: Same plus left hydrosalpinx, pelvic adhesions  Procedure: Laparotomy with lysis of adhesions, BSO  Surgeon: Matthew Saras  Assistant: McComb  EBL: Less than 100 cc  Procedure and findings:  The patient taken the operating room after an adequate level of general anesthesia was obtained with the patient supine the abdomen prepped and draped, Foley catheter was position. Appropriate timeouts were taken at that point.  Transverse incision was made excising her abdominoplasty scar for a short length, this is carried down the fascia which was incised and extended transversely. Rectus muscles divided in the midline, peritoneum entered sparely without incident. Alexis retractor was in position, bowel Speck superiorly out of the field patient placed in Trendelenburg. On the right side ovary appeared to be pretty normal the right hydrosalpinx had previously been removed at the time of her previous TVH. Portion of the fimbriated end room remained. Starting on the right the Babcock clamp was then used to grasp the right tube and ovary the right round ligament was identified clamped divided the right peritoneal space on that side was developed. The right IP ligament was isolated the course of the right ureter was well below. The right IP ligament was then clamped divided first free tie followed by suture ligature 0 Vicryl. In sequential manner the remaining pedicles were clamped divided and suture ligated. The right side was hemostatic. On the left she had a hydrosalpinx noted in some filmy adhesions around: Which were lysed in an avascular plane similarly, the ampulla o'clock clamp was then used to place left tube and ovary on traction, the rest peritoneal space on that left side was developed by dissecting the round ligament until the course of the ureter was seen well below, the left IP ligament was then isolated clamped divided first free  tie followed by suture ligature 0 Vicryl remaining pedicles were clamped divided and suture ligated. The pelvis is in irrigated with saline both operative sites were inspected and noted be hemostatic prior to closure sponge, needle, S precast reported as correct 2 peritoneum closed with a 3-0 Vicryl running suture the same on the rectus muscles in the midline 0 PDS from laterally to midline on either side for the fascia X Pearl solution diluted with quarter percent Marcaine and saline was then inject subfascially and subcutaneously for postoperative analgesia. The skin and subcutaneous tissue was undermined to reduce tension this was rendered hemostatic with the Bovie 4-0 Monocryl subarticular skin closure after a 3-0 Vicryl subcutaneous closure. Half inch Steri-Strips and pressure dressing applied. She tolerated this well went to recovery room in good condition.  Dictated with [place].

## 2017-02-18 NOTE — Anesthesia Procedure Notes (Signed)
Procedure Name: Intubation Date/Time: 02/18/2017 7:23 AM Performed by: Ignacia Bayley Pre-anesthesia Checklist: Patient identified, Patient being monitored, Timeout performed, Emergency Drugs available and Suction available Patient Re-evaluated:Patient Re-evaluated prior to inductionOxygen Delivery Method: Circle System Utilized Preoxygenation: Pre-oxygenation with 100% oxygen Intubation Type: IV induction Ventilation: Mask ventilation without difficulty Laryngoscope Size: Mac and 3 Grade View: Grade II Tube type: Oral Tube size: 7.0 mm Number of attempts: 1 Airway Equipment and Method: stylet and Stylet Placement Confirmation: ETT inserted through vocal cords under direct vision,  positive ETCO2 and breath sounds checked- equal and bilateral Secured at: 20 cm Tube secured with: Tape Dental Injury: Teeth and Oropharynx as per pre-operative assessment

## 2017-02-18 NOTE — Anesthesia Preprocedure Evaluation (Signed)
Anesthesia Evaluation  Patient identified by MRN, date of birth, ID band Patient awake    Reviewed: Allergy & Precautions, NPO status , Patient's Chart, lab work & pertinent test results  Airway Mallampati: II  TM Distance: >3 FB Neck ROM: Full    Dental  (+) Teeth Intact, Dental Advisory Given   Pulmonary sleep apnea ,    Pulmonary exam normal breath sounds clear to auscultation       Cardiovascular hypertension, Normal cardiovascular exam Rhythm:Regular Rate:Normal     Neuro/Psych PSYCHIATRIC DISORDERS Bipolar Disorder negative neurological ROS     GI/Hepatic Neg liver ROS, GERD  ,S/p gastric bypass   Endo/Other  negative endocrine ROS  Renal/GU negative Renal ROS     Musculoskeletal negative musculoskeletal ROS (+)   Abdominal   Peds  Hematology negative hematology ROS (+)   Anesthesia Other Findings Day of surgery medications reviewed with the patient.  Reproductive/Obstetrics LEFT LOWER QUADRANT PAIN                             Anesthesia Physical Anesthesia Plan  ASA: II  Anesthesia Plan: General   Post-op Pain Management:    Induction: Intravenous  PONV Risk Score and Plan: 4 or greater and Ondansetron, Dexamethasone, Propofol, Midazolam, Scopolamine patch - Pre-op, Promethazine and Treatment may vary due to age or medical condition  Airway Management Planned: Oral ETT  Additional Equipment:   Intra-op Plan:   Post-operative Plan: Extubation in OR  Informed Consent: I have reviewed the patients History and Physical, chart, labs and discussed the procedure including the risks, benefits and alternatives for the proposed anesthesia with the patient or authorized representative who has indicated his/her understanding and acceptance.   Dental advisory given  Plan Discussed with: CRNA  Anesthesia Plan Comments: (Risks/benefits of general anesthesia discussed with patient  including risk of damage to teeth, lips, gum, and tongue, nausea/vomiting, allergic reactions to medications, and the possibility of heart attack, stroke and death.  All patient questions answered.  Patient wishes to proceed.)        Anesthesia Quick Evaluation

## 2017-02-18 NOTE — Progress Notes (Signed)
The patient was re-examined with no change in status 

## 2017-02-18 NOTE — Progress Notes (Signed)
Witnessed 21.37ml morphine in sink.

## 2017-02-18 NOTE — Progress Notes (Signed)
PCA dc'd. 21.25mg  morphine wasted. Verified by Danny Lawless, RN.

## 2017-02-18 NOTE — Anesthesia Postprocedure Evaluation (Signed)
Anesthesia Post Note  Patient: Theresa [name]  Procedure(s) Performed: Procedure(s) (LRB): LAPAROTOMY (N/A) BILATERAL SALPINGO OOPHORECTOMY (Bilateral)     Patient location during evaluation: PACU Anesthesia Type: General Level of consciousness: awake and alert Pain management: pain level controlled Vital Signs Assessment: post-procedure vital signs reviewed and stable Respiratory status: spontaneous breathing, nonlabored ventilation and respiratory function stable Cardiovascular status: blood pressure returned to baseline and stable Postop Assessment: no signs of nausea or vomiting Anesthetic complications: no    Last Vitals:  Vitals:   02/18/17 1028 02/18/17 1030  BP:  129/84  Pulse: 64 81  Resp: 14 17  Temp:      Last Pain:  Vitals:   02/18/17 1030  TempSrc:   PainSc: 8    Pain Goal: Patients Stated Pain Goal: 6 (02/18/17 1030)               Lynda Rainwater

## 2017-02-19 ENCOUNTER — Encounter (HOSPITAL_COMMUNITY): Payer: Self-pay | Admitting: Obstetrics and Gynecology

## 2017-02-19 LAB — CBC
HEMATOCRIT: 29.6 % — AB (ref 36.0–46.0)
Hemoglobin: 10.1 g/dL — ABNORMAL LOW (ref 12.0–15.0)
MCH: 31.7 pg (ref 26.0–34.0)
MCHC: 34.1 g/dL (ref 30.0–36.0)
MCV: 92.8 fL (ref 78.0–100.0)
Platelets: 162 10*3/uL (ref 150–400)
RBC: 3.19 MIL/uL — AB (ref 3.87–5.11)
RDW: 12.6 % (ref 11.5–15.5)
WBC: 8.1 10*3/uL (ref 4.0–10.5)

## 2017-02-19 MED ORDER — OXYCODONE-ACETAMINOPHEN 5-325 MG PO TABS: 1.0000 | ORAL_TABLET | ORAL | 0 refills | Status: DC | PRN

## 2017-02-19 MED ORDER — ESTRADIOL 0.1 MG/24HR TD PTWK
0.1000 mg | MEDICATED_PATCH | TRANSDERMAL | Status: DC
  Administered 2017-02-19: 0.1 mg via TRANSDERMAL
  Filled 2017-02-19: qty 1

## 2017-02-19 NOTE — Progress Notes (Signed)
Discharge teaching complete. Pt understood all information and did not have any questions. Pt ambulated of the hospital and discharged home to family.

## 2017-02-19 NOTE — Progress Notes (Signed)
1 Day Post-Op Procedure(s) (LRB): LAPAROTOMY (N/A) BILATERAL SALPINGO OOPHORECTOMY (Bilateral)  Subjective: Patient reports incisional pain.    Objective: I have reviewed patient's vital signs, intake and output, medications and labs.  pressure dressing removed>>>honeycomb is dry CBC    Component Value Date/Time   WBC 8.1 02/19/2017 0459   RBC 3.19 (L) 02/19/2017 0459   HGB 10.1 (L) 02/19/2017 0459   HGB 9.8 (L) 07/11/2015 1119   HCT 29.6 (L) 02/19/2017 0459   HCT 30.7 (L) 07/11/2015 1119   PLT 162 02/19/2017 0459   PLT 281 07/11/2015 1119   MCV 92.8 02/19/2017 0459   MCV 79 07/11/2015 1119   MCH 31.7 02/19/2017 0459   MCHC 34.1 02/19/2017 0459   RDW 12.6 02/19/2017 0459   RDW 17.1 (H) 07/11/2015 1119   LYMPHSABS 1.8 07/11/2015 1119   MONOABS 0.4 04/24/2015 0903   EOSABS 0.2 07/11/2015 1119   BASOSABS 0.0 07/11/2015 1119    Assessment: s/p Procedure(s): LAPAROTOMY (N/A) BILATERAL SALPINGO OOPHORECTOMY (Bilateral): stable  Plan: Advance diet, ambulate, due to hx rouex Y, she avoids NSAID PO>>gets better relief from IV Toradol for now, will shower and see if feels up to D/C this pm  LOS: 1 day    Asani Deniston M 02/19/2017, 8:19 AM

## 2017-02-19 NOTE — Discharge Summary (Signed)
Physician Discharge Summary  Patient ID: Theresa [name] MRN: [id number] DOB/AGE: 13-Apr-1971 46 y.o.  Admit date: 02/18/2017 Discharge date: 02/19/2017  Admission Diagnoses:pelvic pain Pelvic adhesions hydrosalpinx  Discharge Diagnoses:  Active Problems:   Pelvic pain   Discharged Condition: good  Hospital Course: adm for EL>>BSO, on POD 1, dmiet advanced, pt afeb, tol PO and readsy for D/C  Consults: None  Significant Diagnostic Studies: labs:  CBC    Component Value Date/Time   WBC 8.1 02/19/2017 0459   RBC 3.19 (L) 02/19/2017 0459   HGB 10.1 (L) 02/19/2017 0459   HGB 9.8 (L) 07/11/2015 1119   HCT 29.6 (L) 02/19/2017 0459   HCT 30.7 (L) 07/11/2015 1119   PLT 162 02/19/2017 0459   PLT 281 07/11/2015 1119   MCV 92.8 02/19/2017 0459   MCV 79 07/11/2015 1119   MCH 31.7 02/19/2017 0459   MCHC 34.1 02/19/2017 0459   RDW 12.6 02/19/2017 0459   RDW 17.1 (H) 07/11/2015 1119   LYMPHSABS 1.8 07/11/2015 1119   MONOABS 0.4 04/24/2015 0903   EOSABS 0.2 07/11/2015 1119   BASOSABS 0.0 07/11/2015 1119     Treatments: surgery: BSO  Discharge Exam: Blood pressure 138/88, pulse 68, temperature 97.9 F (36.6 C), temperature source Oral, resp. rate 18, height 5' 4"  (1.626 m), weight 162 lb 2 oz (73.5 kg), last menstrual period 10/24/2014, SpO2 100 %. GI: Inc C/D, abd soft + BS  Disposition: 01-Home or Self Care   Allergies as of 02/19/2017      Reactions   Other    Needs benadryl when given narcotics      Medication List    STOP taking these medications   sulfamethoxazole-trimethoprim 800-160 MG tablet Commonly known as:  BACTRIM DS,SEPTRA DS     TAKE these medications   acetaminophen 325 MG tablet Commonly known as:  TYLENOL Take 650 mg by mouth 2 (two) times daily as needed for headache.   cyclobenzaprine 10 MG tablet Commonly known as:  FLEXERIL Take 1 tablet (10 mg total) by mouth 3 (three) times daily as needed.   ferrous sulfate 325 (65 FE) MG EC  tablet Take 325 mg by mouth daily.   oxyCODONE-acetaminophen 5-325 MG tablet Commonly known as:  PERCOCET/ROXICET Take 1-2 tablets by mouth every 4 (four) hours as needed for severe pain (moderate to severe pain (when tolerating fluids)). What changed:  how much to take  reasons to take this      Follow-up Information    Molli Posey, MD. Schedule an appointment as soon as possible for a visit in 1 week(s).   Specialty:  Obstetrics and Gynecology Contact information: [place] [address] [phone]           Signed: Margarette Asal 02/19/2017, 1:16 PM

## 2017-03-04 ENCOUNTER — Emergency Department (HOSPITAL_COMMUNITY)
Admission: EM | Admit: 2017-03-04 | Discharge: 2017-03-05 | Disposition: A | Discharge status: Other Acute Inpatient Hospital | Payer: BC Managed Care – PPO | Attending: Emergency Medicine | Admitting: Emergency Medicine

## 2017-03-04 ENCOUNTER — Encounter (HOSPITAL_COMMUNITY): Payer: Self-pay | Admitting: *Deleted

## 2017-03-04 DIAGNOSIS — I10 Essential (primary) hypertension: Secondary | ICD-10-CM | POA: Diagnosis not present

## 2017-03-04 DIAGNOSIS — F3130 Bipolar disorder, current episode depressed, mild or moderate severity, unspecified: Secondary | ICD-10-CM | POA: Diagnosis not present

## 2017-03-04 DIAGNOSIS — F4312 Post-traumatic stress disorder, chronic: Secondary | ICD-10-CM | POA: Diagnosis present

## 2017-03-04 DIAGNOSIS — F909 Attention-deficit hyperactivity disorder, unspecified type: Secondary | ICD-10-CM | POA: Diagnosis not present

## 2017-03-04 DIAGNOSIS — Z79899 Other long term (current) drug therapy: Secondary | ICD-10-CM | POA: Diagnosis not present

## 2017-03-04 DIAGNOSIS — R45851 Suicidal ideations: Secondary | ICD-10-CM | POA: Diagnosis not present

## 2017-03-04 DIAGNOSIS — F314 Bipolar disorder, current episode depressed, severe, without psychotic features: Secondary | ICD-10-CM | POA: Diagnosis not present

## 2017-03-04 DIAGNOSIS — F4310 Post-traumatic stress disorder, unspecified: Secondary | ICD-10-CM | POA: Diagnosis not present

## 2017-03-04 LAB — COMPREHENSIVE METABOLIC PANEL
ALBUMIN: 4.5 g/dL (ref 3.5–5.0)
ALK PHOS: 87 U/L (ref 38–126)
ALT: 37 U/L (ref 14–54)
ANION GAP: 9 (ref 5–15)
AST: 30 U/L (ref 15–41)
BUN: 10 mg/dL (ref 6–20)
CHLORIDE: 105 mmol/L (ref 101–111)
CO2: 26 mmol/L (ref 22–32)
Calcium: 8.9 mg/dL (ref 8.9–10.3)
Creatinine, Ser: 0.79 mg/dL (ref 0.44–1.00)
GFR calc Af Amer: >60 mL/min (ref >60)
GFR calc non Af Amer: >60 mL/min (ref >60)
GLUCOSE: 96 mg/dL (ref 65–99)
POTASSIUM: 4 mmol/L (ref 3.5–5.1)
SODIUM: 140 mmol/L (ref 135–145)
Total Bilirubin: 0.8 mg/dL (ref 0.3–1.2)
Total Protein: 7.4 g/dL (ref 6.5–8.1)

## 2017-03-04 LAB — CBC
HEMATOCRIT: 38.8 % (ref 36.0–46.0)
HEMOGLOBIN: 12.9 g/dL (ref 12.0–15.0)
MCH: 30.2 pg (ref 26.0–34.0)
MCHC: 33.2 g/dL (ref 30.0–36.0)
MCV: 90.9 fL (ref 78.0–100.0)
Platelets: 311 10*3/uL (ref 150–400)
RBC: 4.27 MIL/uL (ref 3.87–5.11)
RDW: 12.4 % (ref 11.5–15.5)
WBC: 6.6 10*3/uL (ref 4.0–10.5)

## 2017-03-04 LAB — SALICYLATE LEVEL: Salicylate Lvl: <7 mg/dL (ref 2.8–30.0)

## 2017-03-04 LAB — RAPID URINE DRUG SCREEN, HOSP PERFORMED
Amphetamines: NOT DETECTED
BARBITURATES: NOT DETECTED
BENZODIAZEPINES: NOT DETECTED
COCAINE: NOT DETECTED
Opiates: POSITIVE — AB
TETRAHYDROCANNABINOL: NOT DETECTED

## 2017-03-04 LAB — ETHANOL: Alcohol, Ethyl (B): <5 mg/dL (ref <5)

## 2017-03-04 LAB — ACETAMINOPHEN LEVEL

## 2017-03-04 MED ORDER — ONDANSETRON HCL 4 MG PO TABS: 4.0000 mg | ORAL_TABLET | Freq: Three times a day (TID) | ORAL | Status: DC | PRN

## 2017-03-04 MED ORDER — ACETAMINOPHEN 325 MG PO TABS: 650.0000 mg | ORAL_TABLET | ORAL | Status: DC | PRN

## 2017-03-04 MED ORDER — LORAZEPAM 1 MG PO TABS
1.0000 mg | ORAL_TABLET | Freq: Once | ORAL | Status: AC
  Administered 2017-03-04: 1 mg via ORAL
  Filled 2017-03-04: qty 1

## 2017-03-04 MED ORDER — QUETIAPINE FUMARATE 50 MG PO TABS
50.0000 mg | ORAL_TABLET | Freq: Every evening | ORAL | Status: DC | PRN
  Administered 2017-03-04: 50 mg via ORAL
  Filled 2017-03-04: qty 1

## 2017-03-04 NOTE — ED Notes (Signed)
Bed: [facility] Expected date:  Expected time:  Means of arrival:  Comments: [place] Crane A

## 2017-03-04 NOTE — ED Notes (Signed)
Patient tearful on admission. Couldn't respond to questions.  Staff offered support and encouragement. Will continue to monitor patient.

## 2017-03-04 NOTE — ED Provider Notes (Signed)
[facility] DEPT Provider Note   CSN: [id number] Arrival date & time: 03/04/17  1604     History   Chief Complaint Chief Complaint  Patient presents with  . Suicidal    HPI Theresa [name] is a 46 y.o. female.  HPI Presents with boyfriend for suicidal thoughts for the last 3 days. Patient had bilateral nephrectomy 2 weeks ago. Over the past 3-4 days she's had increased crying and drinking alcohol. For the reports the patient has taken his gun on 2 occasions threatening to harm herself. She's also threatened to harm herself with a knife. She has decreased sleep. Pt has a history of previous suicide attempts in 7-8 years ago. Denies previous psychiatric hospitalization. She is on no medications for psychiatric illnesses. Denies any illegal drugs. She has been taking oxycodone for postoperative pain. Past Medical History:  Diagnosis Date  . Anemia   . Attention deficit disorder   . Bipolar disorder ([place])    currently no meds- agitation noted during PAT assessment  . GERD (gastroesophageal reflux disease)    resolved  . H/O transfusion    17 yrs ago postpartum  . Hyperlipidemia    resolved  . Hypertension    history of HTN prior to gastric bypass  . Hypoglycemia   . Left breast abscess 01/22/2017  . Sleep apnea, obstructive    cpap used auto settings, resolved gastric bypass    Patient Active Problem List   Diagnosis Date Noted  . Pelvic pain 02/18/2017  . Left breast abscess 01/22/2017  . Adenomyosis 09/05/2015  . Health care maintenance 04/24/2015  . Anemia 04/20/2015  . Menorrhagia 03/01/2015  . Hyperglycemia 08/18/2014  . NASH (nonalcoholic steatohepatitis) 08/18/2014  . S/P gastric bypass 12/06/2013  . Hyperlipidemia 01/05/2013    Past Surgical History:  Procedure Laterality Date  . ABDOMINAL HYSTERECTOMY    . ABDOMINOPLASTY    . AUGMENTATION MAMMAPLASTY Bilateral 2016  . CESAREAN SECTION    . GASTRIC ROUX-EN-Y N/A 12/06/2013   Procedure: LAPAROSCOPIC  ROUX-EN-Y GASTRIC BYPASS WITH UPPER ENDOSCOPY;  Surgeon: Gayland Curry, MD;  Location: [facility];  Service: General;  Laterality: N/A;  . INCISION AND DRAINAGE ABSCESS Left 01/22/2017   Procedure: INCISION AND DRAINAGE LEFT  BREAST ABSCESS;  Surgeon: Fanny Skates, MD;  Location: [facility];  Service: General;  Laterality: Left;  . LAPAROTOMY N/A 02/18/2017   Procedure: LAPAROTOMY;  Surgeon: Molli Posey, MD;  Location: [facility];  Service: Gynecology;  Laterality: N/A;  . SALPINGOOPHORECTOMY Bilateral 02/18/2017   Procedure: BILATERAL SALPINGO OOPHORECTOMY;  Surgeon: Molli Posey, MD;  Location: [facility];  Service: Gynecology;  Laterality: Bilateral;  . TUBAL LIGATION    . UNILATERAL SALPINGECTOMY Right 09/05/2015   Procedure: UNILATERAL SALPINGECTOMY;  Surgeon: Molli Posey, MD;  Location: [facility];  Service: Gynecology;  Laterality: Right;  [name] VAGINAL HYSTERECTOMY N/A 09/05/2015   Procedure: HYSTERECTOMY VAGINAL;  Surgeon: Molli Posey, MD;  Location: [facility];  Service: Gynecology;  Laterality: N/A;  . WISDOM TOOTH EXTRACTION      OB History    No data available       Home Medications    Prior to Admission medications   Medication Sig Start Date End Date Taking? Authorizing Provider  estradiol (VIVELLE-DOT) 0.1 MG/24HR patch Place 1 patch onto the skin 2 (two) times a week.   Yes [provider]  ibuprofen (ADVIL,MOTRIN) 200 MG tablet Take 200 mg by mouth every 6 (six) hours as needed for moderate pain.   Yes  [provider]  oxyCODONE-acetaminophen (PERCOCET/ROXICET) 5-325 MG tablet Take 1-2 tablets by mouth every 4 (four) hours as needed for severe pain (moderate to severe pain (when tolerating fluids)). 02/19/17  Yes Molli Posey, MD  promethazine (PHENERGAN) 25 MG tablet Take 25 mg by mouth every 6 (six) hours as needed for nausea or vomiting.   Yes [provider]  cyclobenzaprine (FLEXERIL) 10 MG tablet Take 1 tablet (10 mg total) by  mouth 3 (three) times daily as needed. Patient not taking: Reported on 03/04/2017 08/14/16   Kem Parkinson, PA-C    Family History Family History  Problem Relation Age of Onset  . Hypertension Mother   . Cancer Mother 33       breast  . Diabetes Mother   . Hyperlipidemia Mother   . Thyroid disease Mother   . Breast cancer Mother 32  . Hypertension Father   . Diabetes Father   . Cancer Maternal Grandmother        Breast  . Hyperlipidemia Maternal Grandmother   . Heart disease Maternal Grandmother   . Stroke Maternal Grandmother   . Hypertension Maternal Grandmother   . Breast cancer Maternal Grandmother 53  . Hyperlipidemia Maternal Grandfather   . Hypertension Maternal Grandfather   . Cancer Paternal Grandmother        Colon  . Hyperlipidemia Paternal Grandmother   . Heart disease Paternal Grandmother   . Hyperlipidemia Paternal Grandfather   . Hypertension Paternal Grandfather     Social History Social History  Substance Use Topics  . Smoking status: Never Smoker  . Smokeless tobacco: Never Used  . Alcohol use Yes     Comment: rare-occ. monthly     Allergies   Other   Review of Systems Review of Systems  Constitutional: Negative for chills and fever.  Eyes: Negative for visual disturbance.  Respiratory: Negative for shortness of breath.   Cardiovascular: Negative for chest pain.  Gastrointestinal: Positive for abdominal pain. Negative for diarrhea, nausea and vomiting.  Musculoskeletal: Negative for back pain and neck pain.  Skin: Negative for rash and wound.  Neurological: Negative for dizziness, weakness, numbness and headaches.  Psychiatric/Behavioral: Positive for agitation, dysphoric mood, sleep disturbance and suicidal ideas. Negative for hallucinations.  All other systems reviewed and are negative.    Physical Exam Updated Vital Signs BP (!) 162/102 Comment: Pt sobbing  Pulse (!) 102   Temp 98.4 F (36.9 C) (Oral)   Resp 17   LMP 10/24/2014  (Approximate)   SpO2 99%   Physical Exam  Constitutional: She is oriented to person, place, and time. She appears well-developed and well-nourished. No distress.  HENT:  Head: Normocephalic and atraumatic.  Mouth/Throat: Oropharynx is clear and moist. No oropharyngeal exudate.  Eyes: EOM are normal. Pupils are equal, round, and reactive to light.  Neck: Normal range of motion. Neck supple.  Cardiovascular: Normal rate and regular rhythm.  Exam reveals no gallop and no friction rub.   No murmur heard. Pulmonary/Chest: Effort normal and breath sounds normal. No respiratory distress. She has no wheezes. She has no rales. She exhibits no tenderness.  Abdominal: Soft. Bowel sounds are normal. There is tenderness. There is no rebound and no guarding.  Mild lower abdominal tenderness without rebound or guarding.  Musculoskeletal: Normal range of motion. She exhibits no edema or tenderness.  No midline thoracic or lumbar tenderness. No CVA tenderness.  Neurological: She is alert and oriented to person, place, and time.  Moving all extremities without focal deficit.  Sensation fully intact.  Skin: Skin is warm and dry. No rash noted. No erythema.  Psychiatric:  Dysphoric mood. Admits to ongoing suicidal ideation. Denies hallucinations. Cooperative at this time.  Nursing note and vitals reviewed.    ED Treatments / Results  Labs (all labs ordered are listed, but only abnormal results are displayed) Labs Reviewed  ACETAMINOPHEN LEVEL - Abnormal; Notable for the following:       Result Value   Acetaminophen (Tylenol), Serum <10 (*)    All other components within normal limits  RAPID URINE DRUG SCREEN, HOSP PERFORMED - Abnormal; Notable for the following:    Opiates POSITIVE (*)    All other components within normal limits  COMPREHENSIVE METABOLIC PANEL  ETHANOL  SALICYLATE LEVEL  CBC    EKG  EKG Interpretation None       Radiology No results found.  Procedures Procedures  (including critical care time)  Medications Ordered in ED Medications  acetaminophen (TYLENOL) tablet 650 mg (not administered)  ondansetron (ZOFRAN) tablet 4 mg (not administered)  QUEtiapine (SEROQUEL) tablet 50 mg (not administered)  LORazepam (ATIVAN) tablet 1 mg (1 mg Oral Given 03/04/17 2136)     Initial Impression / Assessment and Plan / ED Course  I have reviewed the triage vital signs and the nursing notes.  Pertinent labs & imaging results that were available during my care of the patient were reviewed by me and considered in my medical decision making (see chart for details).     Patient is medically cleared for psychiatric evaluation.  Final Clinical Impressions(s) / ED Diagnoses   Final diagnoses:  Suicidal ideation    New Prescriptions New Prescriptions   No medications on file     Julianne Rice, MD 03/04/17 2227
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
## 2017-03-04 NOTE — ED Notes (Signed)
Pt in scrubs and wanded, belongings went home with husband.

## 2017-03-05 ENCOUNTER — Encounter (HOSPITAL_COMMUNITY): Payer: Self-pay | Admitting: *Deleted

## 2017-03-05 ENCOUNTER — Inpatient Hospital Stay (HOSPITAL_COMMUNITY)
Admission: AD | Admit: 2017-03-05 | Discharge: 2017-03-09 | DRG: 885 | Disposition: A | Discharge status: Home / Self Care | Payer: BC Managed Care – PPO | Source: Intra-hospital | Attending: Psychiatry | Admitting: Psychiatry

## 2017-03-05 DIAGNOSIS — Z789 Other specified health status: Secondary | ICD-10-CM | POA: Diagnosis not present

## 2017-03-05 DIAGNOSIS — F3130 Bipolar disorder, current episode depressed, mild or moderate severity, unspecified: Secondary | ICD-10-CM | POA: Diagnosis not present

## 2017-03-05 DIAGNOSIS — E785 Hyperlipidemia, unspecified: Secondary | ICD-10-CM | POA: Diagnosis present

## 2017-03-05 DIAGNOSIS — R45851 Suicidal ideations: Secondary | ICD-10-CM | POA: Diagnosis not present

## 2017-03-05 DIAGNOSIS — F4312 Post-traumatic stress disorder, chronic: Secondary | ICD-10-CM | POA: Diagnosis present

## 2017-03-05 DIAGNOSIS — F419 Anxiety disorder, unspecified: Secondary | ICD-10-CM | POA: Diagnosis not present

## 2017-03-05 DIAGNOSIS — F1090 Alcohol use, unspecified, uncomplicated: Secondary | ICD-10-CM

## 2017-03-05 DIAGNOSIS — F1910 Other psychoactive substance abuse, uncomplicated: Secondary | ICD-10-CM | POA: Diagnosis not present

## 2017-03-05 DIAGNOSIS — Z813 Family history of other psychoactive substance abuse and dependence: Secondary | ICD-10-CM | POA: Diagnosis not present

## 2017-03-05 DIAGNOSIS — F3160 Bipolar disorder, current episode mixed, unspecified: Secondary | ICD-10-CM | POA: Diagnosis present

## 2017-03-05 DIAGNOSIS — F4310 Post-traumatic stress disorder, unspecified: Secondary | ICD-10-CM

## 2017-03-05 DIAGNOSIS — K219 Gastro-esophageal reflux disease without esophagitis: Secondary | ICD-10-CM | POA: Diagnosis present

## 2017-03-05 DIAGNOSIS — G4700 Insomnia, unspecified: Secondary | ICD-10-CM | POA: Diagnosis not present

## 2017-03-05 DIAGNOSIS — Z7289 Other problems related to lifestyle: Secondary | ICD-10-CM

## 2017-03-05 DIAGNOSIS — Z9884 Bariatric surgery status: Secondary | ICD-10-CM | POA: Diagnosis not present

## 2017-03-05 DIAGNOSIS — F332 Major depressive disorder, recurrent severe without psychotic features: Secondary | ICD-10-CM | POA: Diagnosis not present

## 2017-03-05 DIAGNOSIS — Z818 Family history of other mental and behavioral disorders: Secondary | ICD-10-CM | POA: Diagnosis not present

## 2017-03-05 DIAGNOSIS — F314 Bipolar disorder, current episode depressed, severe, without psychotic features: Secondary | ICD-10-CM | POA: Diagnosis not present

## 2017-03-05 MED ORDER — CYCLOBENZAPRINE HCL 5 MG PO TABS
7.5000 mg | ORAL_TABLET | Freq: Three times a day (TID) | ORAL | Status: DC | PRN
  Filled 2017-03-05: qty 1.5

## 2017-03-05 MED ORDER — LORAZEPAM 1 MG PO TABS
1.0000 mg | ORAL_TABLET | ORAL | Status: DC | PRN
  Administered 2017-03-05 – 2017-03-06 (×2): 1 mg via ORAL
  Filled 2017-03-05 (×3): qty 1

## 2017-03-05 MED ORDER — ESTRADIOL 0.1 MG/24HR TD PTWK
0.1000 mg | MEDICATED_PATCH | TRANSDERMAL | Status: DC
  Administered 2017-03-05: 0.1 mg via TRANSDERMAL
  Filled 2017-03-05: qty 1

## 2017-03-05 MED ORDER — LORAZEPAM 1 MG PO TABS
1.0000 mg | ORAL_TABLET | ORAL | Status: DC | PRN
  Administered 2017-03-05: 1 mg via ORAL
  Filled 2017-03-05: qty 1

## 2017-03-05 MED ORDER — CYCLOBENZAPRINE HCL 10 MG PO TABS
10.0000 mg | ORAL_TABLET | Freq: Three times a day (TID) | ORAL | Status: DC | PRN
  Administered 2017-03-05 – 2017-03-06 (×2): 10 mg via ORAL
  Filled 2017-03-05 (×3): qty 1

## 2017-03-05 MED ORDER — HYDROXYZINE HCL 25 MG PO TABS
25.0000 mg | ORAL_TABLET | Freq: Four times a day (QID) | ORAL | Status: DC | PRN
  Administered 2017-03-06 – 2017-03-08 (×3): 25 mg via ORAL
  Filled 2017-03-05 (×3): qty 1

## 2017-03-05 MED ORDER — GABAPENTIN 100 MG PO CAPS
200.0000 mg | ORAL_CAPSULE | Freq: Two times a day (BID) | ORAL | Status: DC
Start: 2017-03-05 — End: 2017-03-05
  Administered 2017-03-05: 200 mg via ORAL
  Filled 2017-03-05 (×2): qty 2

## 2017-03-05 MED ORDER — IBUPROFEN 200 MG PO TABS: 200.0000 mg | ORAL_TABLET | Freq: Four times a day (QID) | ORAL | Status: DC | PRN

## 2017-03-05 MED ORDER — ONDANSETRON HCL 4 MG PO TABS
4.0000 mg | ORAL_TABLET | Freq: Three times a day (TID) | ORAL | Status: DC | PRN
  Administered 2017-03-05 – 2017-03-06 (×2): 4 mg via ORAL
  Filled 2017-03-05 (×2): qty 1

## 2017-03-05 MED ORDER — TRAZODONE HCL 50 MG PO TABS
50.0000 mg | ORAL_TABLET | Freq: Every evening | ORAL | Status: DC | PRN
  Administered 2017-03-05 – 2017-03-08 (×4): 50 mg via ORAL
  Filled 2017-03-05 (×4): qty 1

## 2017-03-05 MED ORDER — CITALOPRAM HYDROBROMIDE 10 MG PO TABS
10.0000 mg | ORAL_TABLET | Freq: Every day | ORAL | Status: DC
Start: 2017-03-06 — End: 2017-03-08
  Administered 2017-03-06 – 2017-03-08 (×3): 10 mg via ORAL
  Filled 2017-03-05 (×5): qty 1

## 2017-03-05 MED ORDER — IBUPROFEN 200 MG PO TABS
200.0000 mg | ORAL_TABLET | Freq: Four times a day (QID) | ORAL | Status: DC | PRN
  Administered 2017-03-06 – 2017-03-08 (×2): 200 mg via ORAL
  Filled 2017-03-05 (×3): qty 1

## 2017-03-05 MED ORDER — CITALOPRAM HYDROBROMIDE 10 MG PO TABS
10.0000 mg | ORAL_TABLET | Freq: Every day | ORAL | Status: DC
  Administered 2017-03-05: 10 mg via ORAL
  Filled 2017-03-05: qty 1

## 2017-03-05 MED ORDER — MAGNESIUM HYDROXIDE 400 MG/5ML PO SUSP: 30.0000 mL | Freq: Every day | ORAL | Status: DC | PRN

## 2017-03-05 MED ORDER — ALUM & MAG HYDROXIDE-SIMETH 200-200-20 MG/5ML PO SUSP: 30.0000 mL | ORAL | Status: DC | PRN

## 2017-03-05 MED ORDER — GABAPENTIN 100 MG PO CAPS
200.0000 mg | ORAL_CAPSULE | Freq: Two times a day (BID) | ORAL | Status: DC
  Administered 2017-03-05 – 2017-03-09 (×8): 200 mg via ORAL
  Filled 2017-03-05 (×13): qty 2

## 2017-03-05 MED ORDER — ACETAMINOPHEN 325 MG PO TABS
650.0000 mg | ORAL_TABLET | Freq: Four times a day (QID) | ORAL | Status: DC | PRN
  Administered 2017-03-06: 650 mg via ORAL
  Filled 2017-03-05: qty 2

## 2017-03-05 NOTE — Tx Team (Signed)
Initial Treatment Plan 03/05/2017 4:09 PM CYNTHIA COGLE KWI:[id number]    PATIENT STRESSORS: Health problems Marital or family conflict Substance abuse Traumatic event   PATIENT STRENGTHS: Average or above average intelligence Capable of independent living Communication skills Motivation for treatment/growth Supportive family/friends   PATIENT IDENTIFIED PROBLEMS: At risk for suicide  "What's going on to stable me out"  "Plan to get from this day on"  "Mind won't stop racing"               DISCHARGE CRITERIA:  Ability to meet basic life and health needs Improved stabilization in mood, thinking, and/or behavior Motivation to continue treatment in a less acute level of care Need for constant or close observation no longer present Verbal commitment to aftercare and medication compliance Withdrawal symptoms are absent or subacute and managed without 24-hour nursing intervention  PRELIMINARY DISCHARGE PLAN: Attend 12-step recovery group Outpatient therapy Return to previous living arrangement Return to previous work or school arrangements  PATIENT/FAMILY INVOLVEMENT: This treatment plan has been presented to and reviewed with the patient, JAMELYN BOVARD.  The patient and family have been given the opportunity to ask questions and make suggestions.  Dustin Flock, RN 03/05/2017, 4:09 PM

## 2017-03-05 NOTE — ED Notes (Signed)
[facility] transport on unit to transfer pt to [facility] per MD order. Pt has no personal property. Pt signed e-signature. Ambulatory off unit.
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
## 2017-03-05 NOTE — BH Assessment (Signed)
[facility] Assessment Progress Note  Per Corena Pilgrim, MD, this pt requires psychiatric hospitalization at this time.  Leonia Reader, RN, [facility] has assigned pt to [facility] Rm [id number]; they will be ready to receive pt at 13:30.  Pt has signed Voluntary Admission and Consent for Treatment, as well as Consent to Release Information to her boyfriend and her estranged husband, and signed forms have been faxed to [facility].  Pt's nurse, Caryl Pina, has been notified, and agrees to send original paperwork along with pt via [employer], and to call report to [phone].  Jalene Mullet, [place] Triage Specialist [phone]

## 2017-03-05 NOTE — ED Notes (Signed)
Pt  anxious, tearful. This nurse notified NP of pt behavior. Awaiting orders.
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
## 2017-03-06 DIAGNOSIS — G4700 Insomnia, unspecified: Secondary | ICD-10-CM

## 2017-03-06 DIAGNOSIS — F332 Major depressive disorder, recurrent severe without psychotic features: Secondary | ICD-10-CM

## 2017-03-06 DIAGNOSIS — Z813 Family history of other psychoactive substance abuse and dependence: Secondary | ICD-10-CM

## 2017-03-06 DIAGNOSIS — F1910 Other psychoactive substance abuse, uncomplicated: Secondary | ICD-10-CM

## 2017-03-06 DIAGNOSIS — F419 Anxiety disorder, unspecified: Secondary | ICD-10-CM

## 2017-03-06 DIAGNOSIS — F4310 Post-traumatic stress disorder, unspecified: Secondary | ICD-10-CM

## 2017-03-06 DIAGNOSIS — Z818 Family history of other mental and behavioral disorders: Secondary | ICD-10-CM

## 2017-03-06 MED ORDER — ESTRADIOL 0.1 MG/24HR TD PTWK
0.1000 mg | MEDICATED_PATCH | TRANSDERMAL | Status: DC
  Administered 2017-03-08: 0.1 mg via TRANSDERMAL
  Filled 2017-03-06: qty 1

## 2017-03-06 MED ORDER — ESTRADIOL 1 MG PO TABS
0.5000 mg | ORAL_TABLET | Freq: Every day | ORAL | Status: DC
  Administered 2017-03-06 – 2017-03-09 (×4): 0.5 mg via ORAL

## 2017-03-06 NOTE — Progress Notes (Signed)
Pt is new to the unit this evening.  She reports that she is still having some passive suicidal thoughts, but can contract for safety.  She denies HI/AVH.  She is goal driven to figure out what is causing her anxiety and suicidal thoughts.  She says her life is good, and her family is supportive.  She reported her anxiety and depression as high.  Writer reviewed the meds that are available to patient tonight.  She voiced understanding.  Pt was encouraged to make her needs known to staff.  Support and encouragement offered.  Meds given as ordered.  Pt received prns Ativan for anxiety, Flexeril for pain in her abdominal pain d/t recent surgery, and Trazodone to initiate sleep.  Discharge plans are in process.  Safety maintained with q15 minute checks.

## 2017-03-06 NOTE — [facility] Group Notes (Signed)
[facility] LCSW Group Therapy 03/06/2017 1:15pm  Type of Therapy and Topic: Group Therapy: Avoiding Self-Sabotaging and Enabling Behaviors   Participation Level: Pt was present for the duration of the group. Pt did not participate in the discussion. Slept the entire time.  Matthew Saras, MSW, LCSWA 03/06/2017 4:55 PM

## 2017-03-06 NOTE — Progress Notes (Signed)
D: Pt presented with flat affect, depressed mood and crying spells. Pt rates depression 8/10. Anxiety 10/10. Pt endorses passive suicidal thoughts with no plan or intent. Pt verbally contracts for safety. Pt stated that she spoke with her medical doctor today and he suggested she increase her Estrace. Per pt, her doctor stated that it's a possibility she may be experiencing these symptoms due to low estrogen levels. Pt boyfriend picked up her new prescription that was called in by her MD to the pharmacy and brought it to [facility]. Pt med was started per Herbert Pun, NP.  A: Medications administered as ordered per MD. Medications reviewed with pt. Verbal support provided. Pt encouraged to attend groups. 15 minute checks performed for safety .  Pt receptive to tx.
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
## 2017-03-06 NOTE — Progress Notes (Signed)
Adult Psychoeducational Group Note  Date:  03/06/2017 Time: 9:00 am  Group Topic/Focus:  Orientation:   The focus of this group is to educate the patient on the purpose and policies of crisis stabilization and provide a format to answer questions about their admission.  The group details unit policies and expectations of patients while admitted.  Participation Level:  Active  Participation Quality:  Appropriate  Affect:  Appropriate  Cognitive:  Appropriate  Insight: Appropriate  Engagement in Group:  Engaged  Modes of Intervention:  Discussion and Orientation  Additional Comments:    Humbert Morozov L 03/06/2017 9:00 am

## 2017-03-06 NOTE — Progress Notes (Signed)
Pt attend wrap up group. Her day was a 8. Her goal was to improve on herself and she feels she has accomplish her goal for today.
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
## 2017-03-06 NOTE — Progress Notes (Signed)
Adult Psychoeducational Group Note  Date:  03/06/2017 Time:  0900 am  Group Topic/Focus:  Orientation:   The focus of this group is to educate the patient on the purpose and policies of crisis stabilization and provide a format to answer questions about their admission.  The group details unit policies and expectations of patients while admitted.  Participation Level:  Active  Participation Quality:  Appropriate  Affect:  Appropriate  Cognitive:  Appropriate  Insight: Appropriate  Engagement in Group:  Engaged  Modes of Intervention:  Discussion and Orientation  Additional Comments:    Merrel Crabbe L 03/06/2017 9:00 am

## 2017-03-06 NOTE — Progress Notes (Signed)
Pt reports that she has had a good day and feels that the meds are helping her mood and outlook.  Her boyfriend visited tonight.  He says that he can tell a difference in her already.  She seems more relaxed.  She denies SI/HI/AVH this evening.  She tells Probation officer that her PCP suggested that her hormone medications probably needed to be increased, so that order was put in today.  She is hopeful to discharge in a couple of days.  Pt makes her needs known to staff.  She is pleasant and cooperative.  Pt is attending groups and participating in unit activities.  Support and encouragement offered.  Discharge plans are in process.  Safety maintained with q15 minute checks.

## 2017-03-07 LAB — TSH: TSH: 0.966 u[IU]/mL (ref 0.350–4.500)

## 2017-03-07 NOTE — Progress Notes (Signed)
Recreation Therapy Notes  Date: 03/07/17 Time: 0930 Location: 300 Hall Dayroom  Group Topic: Stress Management  Goal Area(s) Addresses:  Patient will verbalize importance of using healthy stress management.  Patient will identify positive emotions associated with healthy stress management.   Intervention: Stress Management  Activity :  Progressive Muscle Relaxation.  LRT introduced the stress management technique of progressive muscle relaxation.  LRT read a script that guided patients through steps of tensing and relaxing each muscle group.  Patients were to follow along as LRT read script to fully participate in the technique.  Education:  Stress Management, Discharge Planning.   Education Outcome: Acknowledges edcuation/In group clarification offered/Needs additional education  Clinical Observations/Feedback: Pt did not attend group.   Victorino Sparrow, LRT/CTRS         Victorino Sparrow A 03/07/2017 11:27 AM

## 2017-03-07 NOTE — Plan of Care (Signed)
Problem: Self-Concept: Goal: Ability to verbalize positive feelings about self will improve Outcome: Progressing Patient reports, "I am feeling so much better. The depression is much better and I am not wanting to hurt myself any more."

## 2017-03-07 NOTE — Progress Notes (Addendum)
[facility] MD Progress Note  03/07/2017 5:50 PM RUSHIE BRAZEL  MRN:  [id number] Subjective:  Patient states " I feel much better". At this time states her mood is much improved, and currently denies any suicidal ideations. Denies medication side effects. Objective : I have discussed case with treatment team and have met with patient . Patient presents significantly improved compared to her admission presentation- currently presents with fuller range of affect, brighter, and less anxious . She is more future oriented, and is currently focused on being discharged soon. Denies any suicidal ideations . She denies medication side effects. No disruptive or agitated behaviors on unit, going to some groups.  TSH WNL.  Principal Problem: Depression Diagnosis:   Patient Active Problem List   Diagnosis Date Noted  . Bipolar affect, depressed ([place]) [F31.30] 03/05/2017  . Chronic post-traumatic stress disorder (PTSD) [F43.12] 03/05/2017  . Bipolar affective disorder, current episode mixed ([place]) [F31.60] 03/05/2017  . Pelvic pain [R10.2] 02/18/2017  . Left breast abscess [N61.1] 01/22/2017  . Adenomyosis [N80.0] 09/05/2015  . Health care maintenance [Z00.00] 04/24/2015  . Anemia [D64.9] 04/20/2015  . Menorrhagia [N92.0] 03/01/2015  . Hyperglycemia [R73.9] 08/18/2014  . NASH (nonalcoholic steatohepatitis) [K75.81] 08/18/2014  . S/P gastric bypass [Z98.84] 12/06/2013  . Hyperlipidemia [E78.5] 01/05/2013   Total Time spent with patient: 20 minutes  Past Medical History:  Past Medical History:  Diagnosis Date  . Anemia   . Attention deficit disorder   . Bipolar disorder ([place])    currently no meds- agitation noted during PAT assessment  . GERD (gastroesophageal reflux disease)    resolved  . H/O transfusion    17 yrs ago postpartum  . Hyperlipidemia    resolved  . Hypertension    history of HTN prior to gastric bypass  . Hypoglycemia   . Left breast abscess 01/22/2017  . Sleep apnea,  obstructive    cpap used auto settings, resolved gastric bypass    Past Surgical History:  Procedure Laterality Date  . ABDOMINAL HYSTERECTOMY    . ABDOMINOPLASTY    . AUGMENTATION MAMMAPLASTY Bilateral 2016  . CESAREAN SECTION    . GASTRIC ROUX-EN-Y N/A 12/06/2013   Procedure: LAPAROSCOPIC ROUX-EN-Y GASTRIC BYPASS WITH UPPER ENDOSCOPY;  Surgeon: Gayland Curry, MD;  Location: [facility];  Service: General;  Laterality: N/A;  . INCISION AND DRAINAGE ABSCESS Left 01/22/2017   Procedure: INCISION AND DRAINAGE LEFT  BREAST ABSCESS;  Surgeon: Fanny Skates, MD;  Location: [facility];  Service: General;  Laterality: Left;  . LAPAROTOMY N/A 02/18/2017   Procedure: LAPAROTOMY;  Surgeon: Molli Posey, MD;  Location: [facility];  Service: Gynecology;  Laterality: N/A;  . SALPINGOOPHORECTOMY Bilateral 02/18/2017   Procedure: BILATERAL SALPINGO OOPHORECTOMY;  Surgeon: Molli Posey, MD;  Location: [facility];  Service: Gynecology;  Laterality: Bilateral;  . TUBAL LIGATION    . UNILATERAL SALPINGECTOMY Right 09/05/2015   Procedure: UNILATERAL SALPINGECTOMY;  Surgeon: Molli Posey, MD;  Location: [facility];  Service: Gynecology;  Laterality: Right;  [name] VAGINAL HYSTERECTOMY N/A 09/05/2015   Procedure: HYSTERECTOMY VAGINAL;  Surgeon: Molli Posey, MD;  Location: [facility];  Service: Gynecology;  Laterality: N/A;  . WISDOM TOOTH EXTRACTION     Family History:  Family History  Problem Relation Age of Onset  . Hypertension Mother   . Cancer Mother 89       breast  . Diabetes Mother   . Hyperlipidemia Mother   . Thyroid disease Mother   . Breast cancer Mother 53  .  Hypertension Father   . Diabetes Father   . Cancer Maternal Grandmother        Breast  . Hyperlipidemia Maternal Grandmother   . Heart disease Maternal Grandmother   . Stroke Maternal Grandmother   . Hypertension Maternal Grandmother   . Breast cancer Maternal Grandmother 82  . Hyperlipidemia Maternal Grandfather   .  Hypertension Maternal Grandfather   . Cancer Paternal Grandmother        Colon  . Hyperlipidemia Paternal Grandmother   . Heart disease Paternal Grandmother   . Hyperlipidemia Paternal Grandfather   . Hypertension Paternal Grandfather    Social History:  History  Alcohol Use  . Yes    Comment: rare-occ. monthly     History  Drug Use No    Social History   Social History  . Marital status: Legally Separated    Spouse name: N/A  . Number of children: 4  . Years of education: N/A   Occupational History  . 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 Smoking status: Never Smoker  . Smokeless tobacco: Never Used  . Alcohol use Yes     Comment: rare-occ. monthly  . Drug use: No  . Sexual activity: Yes    Birth control/ protection: Surgical   Other Topics Concern  . None   Social History Narrative   [name] lives with her husband some of her children & 1 grand-son. She works FT as Audiological scientist, also teaches.   Additional Social History:   Sleep: improved  Appetite:  improved   Current Medications: Current Facility-Administered Medications  Medication Dose Route Frequency Provider Last Rate Last Dose  . acetaminophen (TYLENOL) tablet 650 mg  650 mg Oral Q6H PRN Ethelene Hal, NP   650 mg at 03/06/17 1327  . alum & mag hydroxide-simeth (MAALOX/MYLANTA) 200-200-20 MG/5ML suspension 30 mL  30 mL Oral Q4H PRN Ethelene Hal, NP      . citalopram (CELEXA) tablet 10 mg  10 mg Oral Daily Ethelene Hal, NP   10 mg at 03/07/17 9381  . cyclobenzaprine (FLEXERIL) tablet 10 mg  10 mg Oral TID PRN Lindell Spar I, NP   10 mg at 03/06/17 2057  . [START ON 03/08/2017] estradiol (CLIMARA - Dosed in mg/24 hr) patch 0.1 mg  0.1 mg Transdermal Once per day on Wed Sat [place], Agnes I, NP      . estradiol (ESTRACE) tablet 0.5 mg  0.5 mg Oral Daily Lindell Spar I, NP   0.5 mg at 03/07/17 0814  . gabapentin (NEURONTIN) capsule 200 mg  200 mg Oral BID  Ethelene Hal, NP   200 mg at 03/07/17 1640  . hydrOXYzine (ATARAX/VISTARIL) tablet 25 mg  25 mg Oral Q6H PRN Ethelene Hal, NP   25 mg at 03/06/17 1208  . ibuprofen (ADVIL,MOTRIN) tablet 200 mg  200 mg Oral Q6H PRN Ethelene Hal, NP   200 mg at 03/06/17 1208  . LORazepam (ATIVAN) tablet 1 mg  1 mg Oral Q4H PRN Ethelene Hal, NP   1 mg at 03/06/17 0175  . magnesium hydroxide (MILK OF MAGNESIA) suspension 30 mL  30 mL Oral Daily PRN Ethelene Hal, NP      . ondansetron [facility]) tablet 4 mg  4 mg Oral Q8H PRN Ethelene Hal, NP   4 mg at 03/06/17 1025  . traZODone (DESYREL) tablet 50 mg  50 mg Oral QHS PRN Ethelene Hal, NP  50 mg at 03/06/17 2057    Lab Results:  Results for orders placed or performed during the hospital encounter of 03/05/17 (from the past 48 hour(s))  TSH     Status: None   Collection Time: 03/07/17  6:02 AM  Result Value Ref Range   TSH 0.966 0.350 - 4.500 uIU/mL    Comment: Performed by a 3rd Generation assay with a functional sensitivity of <=0.01 uIU/mL. Performed at [facility], [place] [address], [place], [place] [address]     Blood Alcohol level:  Lab Results  Component Value Date   ETH <5 37/90/2409    Metabolic Disorder Labs: Lab Results  Component Value Date   HGBA1C 5.7 (H) 08/18/2014   MPG 117 (H) 08/18/2014   MPG 123 (H) 10/18/2013   No results found for: PROLACTIN Lab Results  Component Value Date   CHOL 162 08/18/2014   TRIG 71 08/18/2014   HDL 47 08/18/2014   CHOLHDL 3.4 08/18/2014   VLDL 14 08/18/2014   LDLCALC 101 (H) 08/18/2014   LDLCALC 77 10/18/2013    Physical Findings: AIMS: Facial and Oral Movements Muscles of Facial Expression: None, normal Lips and Perioral Area: None, normal Jaw: None, normal Tongue: None, normal,Extremity Movements Upper (arms, wrists, hands, fingers): None, normal Lower (legs, knees, ankles, toes): None, normal, Trunk  Movements Neck, shoulders, hips: None, normal, Overall Severity Severity of abnormal movements (highest score from questions above): None, normal Incapacitation due to abnormal movements: None, normal Patient's awareness of abnormal movements (rate only patient's report): No Awareness, Dental Status Current problems with teeth and/or dentures?: No Does patient usually wear dentures?: No  CIWA:    COWS:     Musculoskeletal: Strength & Muscle Tone: within normal limits Gait & Station: normal Patient leans: N/A  Psychiatric Specialty Exam: Physical Exam  ROS at this time denies headache, denies chest pain, no shortness of breath, no vomiting   Blood pressure 126/82, pulse 94, temperature 98.4 F (36.9 C), temperature source Oral, resp. rate 18, height 5' 4"  (1.626 m), weight 68 kg (150 lb), last menstrual period 10/24/2014, SpO2 99 %.Body mass index is 25.75 kg/m.  General Appearance: improved grooming   Eye Contact:  Good  Speech:  Normal Rate  Volume:  Normal  Mood:  improved mood, currently denies feeling depressed , states she feels back to normal  Affect:  more reactive, less labile, improved, remains anxious  Thought Process:  Linear and Descriptions of Associations: Intact  Orientation:  Full (Time, Place, and Person)  Thought Content:  denies hallucinations, no delusions, not internally preoccupied   Suicidal Thoughts:  No denies suicidal or self injurious ideations, denies homicidal or violent ideations  Homicidal Thoughts:  No  Memory:  recent and remote grossly intact   Judgement:  Other:  improving   Insight:  Fair and improving   Psychomotor Activity:  Normal  Concentration:  Concentration: Good and Attention Span: Good  Recall:  Good  Fund of Knowledge:  Good  Language:  Good  Akathisia:  Negative  Handed:  Right  AIMS (if indicated):     Assets:  Communication Skills Desire for Improvement Resilience  ADL's:  Intact  Cognition:  WNL  Sleep:  Number of  Hours: 6.75   Assessment - patient is improving , at this time reports her mood is much improved, denies feeling depressed, affect is more reactive,brighter, although somewhat anxious. Denies SI. She is tolerating medications well . She is currently focused on being discharged soon.  Treatment Plan Summary: Daily contact with patient to assess and evaluate symptoms and progress in treatment, Medication management, Plan inpatient treatment  and medications as below Encourage group and milieu participation to work on coping skills and symptom reduction Continue Celexa 10 mgrs QDAY for depression and anxiety Continue Neurontin 200 mgrs BID for anxiety, pain Continue Vistaril 25 mgrs Q6 hours PRN for anxiety as needed  Continue Trazodone 50 mgrs QHS PRN for insomnia Treatment team working on disposition planning options- would consider weekend discharge if she continues to improve/stabilize.  Jenne Campus, MD 03/07/2017, 5:50 PM

## 2017-03-07 NOTE — BHH Group Notes (Signed)
[facility] LCSW Group Therapy 03/07/2017 1:15pm  Type of Therapy: Group Therapy- Feelings Around Relapse and Recovery  Participation Level: Pt spent the beginning of group complaining about not being discharged and about things on the schedule being cancelled. "How can I make a back-up plan for my recovery when you guys don't even use back-up plans for cancelled activities here". Pt did not participate in the discussion beyond these statements.    Matthew Saras, MSW, Latanya Presser [phone] 03/07/2017 3:06 PM '

## 2017-03-07 NOTE — Progress Notes (Signed)
Patient has been up and active on the unit, attended group this evening and reports having had a good day but wishes that they had been able to go outside today so that she could get some fresh air and move about a little more since she is a runner. She was in formed of her medications available and requested trazadone for sleep. She reports feeling much better and is hopeful to discharge soon. Patient currently denies having pain, -si/hi/a/v hall. Support and encouragement offered, safety maintained on unit, will continue to monitor.
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
## 2017-03-07 NOTE — Tx Team (Signed)
Interdisciplinary Treatment and Diagnostic Plan Update 03/07/2017 Time of Session: 9:30am  Theresa [name]  MRN: [id number]  Principal Diagnosis: Depression   Secondary Diagnoses: Active Problems:   Bipolar affective disorder, current episode mixed ([place])   Current Medications:  Current Facility-Administered Medications  Medication Dose Route Frequency Provider Last Rate Last Dose  . acetaminophen (TYLENOL) tablet 650 mg  650 mg Oral Q6H PRN Ethelene Hal, NP   650 mg at 03/06/17 1327  . alum & mag hydroxide-simeth (MAALOX/MYLANTA) 200-200-20 MG/5ML suspension 30 mL  30 mL Oral Q4H PRN Ethelene Hal, NP      . citalopram (CELEXA) tablet 10 mg  10 mg Oral Daily Ethelene Hal, NP   10 mg at 03/07/17 3810  . cyclobenzaprine (FLEXERIL) tablet 10 mg  10 mg Oral TID PRN Lindell Spar I, NP   10 mg at 03/06/17 2057  . [START ON 03/08/2017] estradiol (CLIMARA - Dosed in mg/24 hr) patch 0.1 mg  0.1 mg Transdermal Once per day on Wed Sat Nwoko, Agnes I, NP      . estradiol (ESTRACE) tablet 0.5 mg  0.5 mg Oral Daily Lindell Spar I, NP   0.5 mg at 03/07/17 0814  . gabapentin (NEURONTIN) capsule 200 mg  200 mg Oral BID Ethelene Hal, NP   200 mg at 03/07/17 0813  . hydrOXYzine (ATARAX/VISTARIL) tablet 25 mg  25 mg Oral Q6H PRN Ethelene Hal, NP   25 mg at 03/06/17 1208  . ibuprofen (ADVIL,MOTRIN) tablet 200 mg  200 mg Oral Q6H PRN Ethelene Hal, NP   200 mg at 03/06/17 1208  . LORazepam (ATIVAN) tablet 1 mg  1 mg Oral Q4H PRN Ethelene Hal, NP   1 mg at 03/06/17 1751  . magnesium hydroxide (MILK OF MAGNESIA) suspension 30 mL  30 mL Oral Daily PRN Ethelene Hal, NP      . ondansetron [facility]) tablet 4 mg  4 mg Oral Q8H PRN Ethelene Hal, NP   4 mg at 03/06/17 0258  . traZODone (DESYREL) tablet 50 mg  50 mg Oral QHS PRN Ethelene Hal, NP   50 mg at 03/06/17 2057    PTA Medications: Prescriptions Prior to Admission   Medication Sig Dispense Refill Last Dose  . cyclobenzaprine (FLEXERIL) 10 MG tablet Take 1 tablet (10 mg total) by mouth 3 (three) times daily as needed. (Patient not taking: Reported on 03/04/2017) 21 tablet 0 Completed Course at Unknown time  . estradiol (VIVELLE-DOT) 0.1 MG/24HR patch Place 1 patch onto the skin 2 (two) times a week.   Past Week at Unknown time  . ibuprofen (ADVIL,MOTRIN) 200 MG tablet Take 200 mg by mouth every 6 (six) hours as needed for moderate pain.   Past Week at Unknown time  . oxyCODONE-acetaminophen (PERCOCET/ROXICET) 5-325 MG tablet Take 1-2 tablets by mouth every 4 (four) hours as needed for severe pain (moderate to severe pain (when tolerating fluids)). 30 tablet 0 Past Week at Unknown time  . promethazine (PHENERGAN) 25 MG tablet Take 25 mg by mouth every 6 (six) hours as needed for nausea or vomiting.   03/03/2017 at Unknown time    Treatment Modalities: Medication Management, Group therapy, Case management,  1 to 1 session with clinician, Psychoeducation, Recreational therapy.  Patient Stressors: Health problems Marital or family conflict Substance abuse Traumatic event Patient Strengths: Average or above average intelligence Capable of independent living Communication skills Motivation for treatment/growth Supportive family/friends  Physician Treatment Plan  for Primary Diagnosis: Depression  Long Term Goal(s): Improvement in symptoms so as ready for discharge Short Term Goals: Ability to verbalize feelings will improve Ability to disclose and discuss suicidal ideas Ability to demonstrate self-control will improve Ability to identify and develop effective coping behaviors will improve Ability to maintain clinical measurements within normal limits will improve Compliance with prescribed medications will improve Ability to verbalize feelings will improve Ability to disclose and discuss suicidal ideas Ability to demonstrate self-control will  improve Ability to identify and develop effective coping behaviors will improve Ability to maintain clinical measurements within normal limits will improve Compliance with prescribed medications will improve  Medication Management: Evaluate patient's response, side effects, and tolerance of medication regimen.  Therapeutic Interventions: 1 to 1 sessions, Unit Group sessions and Medication administration.  Evaluation of Outcomes: Progressing  Physician Treatment Plan for Secondary Diagnosis: Active Problems:   Bipolar affective disorder, current episode mixed ([place])  Long Term Goal(s): Improvement in symptoms so as ready for discharge  Short Term Goals: Ability to verbalize feelings will improve Ability to disclose and discuss suicidal ideas Ability to demonstrate self-control will improve Ability to identify and develop effective coping behaviors will improve Ability to maintain clinical measurements within normal limits will improve Compliance with prescribed medications will improve Ability to verbalize feelings will improve Ability to disclose and discuss suicidal ideas Ability to demonstrate self-control will improve Ability to identify and develop effective coping behaviors will improve Ability to maintain clinical measurements within normal limits will improve Compliance with prescribed medications will improve  Medication Management: Evaluate patient's response, side effects, and tolerance of medication regimen.  Therapeutic Interventions: 1 to 1 sessions, Unit Group sessions and Medication administration.  Evaluation of Outcomes: Progressing  RN Treatment Plan for Primary Diagnosis: Depression  Long Term Goal(s): Knowledge of disease and therapeutic regimen to maintain health will improve  Short Term Goals: Compliance with prescribed medications will improve  Medication Management: RN will administer medications as ordered by provider, will assess and evaluate patient's  response and provide education to patient for prescribed medication. RN will report any adverse and/or side effects to prescribing provider.  Therapeutic Interventions: 1 on 1 counseling sessions, Psychoeducation, Medication administration, Evaluate responses to treatment, Monitor vital signs and CBGs as ordered, Perform/monitor CIWA, COWS, AIMS and Fall Risk screenings as ordered, Perform wound care treatments as ordered.  Evaluation of Outcomes: Progressing  LCSW Treatment Plan for Primary Diagnosis: Depression  Long Term Goal(s): Safe transition to appropriate next level of care at discharge, Engage patient in therapeutic group addressing interpersonal concerns. Short Term Goals: Engage patient in aftercare planning with referrals and resources, Increase ability to appropriately verbalize feelings, Identify triggers associated with mental health/substance abuse issues and Increase skills for wellness and recovery  Therapeutic Interventions: Assess for all discharge needs, 1 to 1 time with Social worker, Explore available resources and support systems, Assess for adequacy in community support network, Educate family and significant other(s) on suicide prevention, Complete Psychosocial Assessment, Interpersonal group therapy.  Evaluation of Outcomes: Progressing  Progress in Treatment: Attending groups: Yes Participating in groups: Minimally Taking medication as prescribed: Yes, MD continues to assess for medication changes as needed Toleration medication: Yes, no side effects reported at this time Family/Significant other contact made: No, CSW will make contact if pt consents Patient understands diagnosis: Continuing to assess Discussing patient identified problems/goals with staff: Yes Medical problems stabilized or resolved: Yes Denies suicidal/homicidal ideation: Yes Issues/concerns per patient self-inventory: None Other: N/A  New problem(s) identified:  None identified at this time.    New Short Term/Long Term Goal(s): None identified at this time.   Discharge Plan or Barriers: Pt will return home and follow up with an outpatient provider.  Reason for Continuation of Hospitalization:  Anxiety  Depression Medication stabilization Suicidal ideation  Estimated Length of Stay: 1-3 days; Estimated discharge date 03/10/17  Attendees: Patient: 03/07/2017 10:58 AM  Physician: Dr. Parke Poisson 03/07/2017 10:58 AM  Nursing: Kieth Brightly RN; [place], RN 03/07/2017 10:58 AM  RN Care Manager:  03/07/2017 10:58 AM  Social Worker: Matthew Saras, [place] 03/07/2017 10:58 AM  Recreational Therapist:  03/07/2017 10:58 AM  Other: Lindell Spar, NP 03/07/2017 10:58 AM  Other:  03/07/2017 10:58 AM  Other: 03/07/2017 10:58 AM  Scribe for Treatment Team: Georga Kaufmann, MSW,LCSWA 03/07/2017 10:58 AM

## 2017-03-08 MED ORDER — CITALOPRAM HYDROBROMIDE 20 MG PO TABS
20.0000 mg | ORAL_TABLET | Freq: Every day | ORAL | Status: DC
  Administered 2017-03-09: 20 mg via ORAL
  Filled 2017-03-08 (×3): qty 1

## 2017-03-08 NOTE — Progress Notes (Signed)
Patient attended group and said that her day was a 9.  Her coping skills were coloring and Journaling.

## 2017-03-08 NOTE — Progress Notes (Signed)
Data. Patient denies SI/HI/AVH. Patient reported to nurse early this AM,  "Last night when I saw my boyfriend, I went to hug him and jammed my thumb into his side by accident. Now it is swollen and hurts." Patient's left thumb is visibly swollen. IBU and ice pack given. Patient reports this happened about 6:30pm on 03/07/17. Provider notified. Patient became very upset after meeting with the NP. She reported that the NP, Said I am not going to be going to be going home until at least Monday. I was told that I could leave tomorrow." Patient became increasingly upset, agitated and began pacing the halls and drawing peers into her agitation. Patient began cursing and was tearful. Nurse took patient to her room to talk with her and offer support. Patient was able to calm down. NP was asked about situation and stated that she would be able to go home tomorrow, as MD had stated. Patient informed and her mood became pleasant for the rest of the shift. Left thumb with decrease swelling by late afternoon, and patient denies pain.  Action. Emotional support and encouragement offered. Education provided on medication, indications and side effect. Q 15 minute checks done for safety. Response. Safety on the unit maintained through 15 minute checks.  Medications taken as prescribed. Attended groups.

## 2017-03-08 NOTE — Progress Notes (Signed)
[facility] MD Progress Note  03/08/2017 4:04 PM Theresa [name]  MRN:  [id number]   Subjective: patient reports " I am feeling a lot better today, and I feel ready to discharge. Patient present with slight irritable and agitation regarding her disposition.  Objective :Theresa [name] is awake, alert and oriented. Seen pacing the unit. States " I feel like I am benign held against my will."  Denies suicidal or homicidal ideation. Denies auditory or visual hallucination and does not appear to be responding to internal stimuli. Reports she was only depressed because of her estrogen levels. Patient interacts well with staff and others. Patient reports she is medication compliant without mediation side effects. Denies depression or depressive symptoms.Support, encouragement and reassurance was provided.   Principal Problem: Depression Diagnosis:   Patient Active Problem List   Diagnosis Date Noted  . Bipolar affect, depressed ([place]) [F31.30] 03/05/2017  . Chronic post-traumatic stress disorder (PTSD) [F43.12] 03/05/2017  . Bipolar affective disorder, current episode mixed ([place]) [F31.60] 03/05/2017  . Pelvic pain [R10.2] 02/18/2017  . Left breast abscess [N61.1] 01/22/2017  . Adenomyosis [N80.0] 09/05/2015  . Health care maintenance [Z00.00] 04/24/2015  . Anemia [D64.9] 04/20/2015  . Menorrhagia [N92.0] 03/01/2015  . Hyperglycemia [R73.9] 08/18/2014  . NASH (nonalcoholic steatohepatitis) [K75.81] 08/18/2014  . S/P gastric bypass [Z98.84] 12/06/2013  . Hyperlipidemia [E78.5] 01/05/2013   Total Time spent with patient: 20 minutes  Past Medical History:  Past Medical History:  Diagnosis Date  . Anemia   . Attention deficit disorder   . Bipolar disorder ([place])    currently no meds- agitation noted during PAT assessment  . GERD (gastroesophageal reflux disease)    resolved  . H/O transfusion    17 yrs ago postpartum  . Hyperlipidemia    resolved  . Hypertension    history of HTN prior to  gastric bypass  . Hypoglycemia   . Left breast abscess 01/22/2017  . Sleep apnea, obstructive    cpap used auto settings, resolved gastric bypass    Past Surgical History:  Procedure Laterality Date  . ABDOMINAL HYSTERECTOMY    . ABDOMINOPLASTY    . AUGMENTATION MAMMAPLASTY Bilateral 2016  . CESAREAN SECTION    . GASTRIC ROUX-EN-Y N/A 12/06/2013   Procedure: LAPAROSCOPIC ROUX-EN-Y GASTRIC BYPASS WITH UPPER ENDOSCOPY;  Surgeon: Gayland Curry, MD;  Location: [facility];  Service: General;  Laterality: N/A;  . INCISION AND DRAINAGE ABSCESS Left 01/22/2017   Procedure: INCISION AND DRAINAGE LEFT  BREAST ABSCESS;  Surgeon: Fanny Skates, MD;  Location: [place];  Service: General;  Laterality: Left;  . LAPAROTOMY N/A 02/18/2017   Procedure: LAPAROTOMY;  Surgeon: Molli Posey, MD;  Location: [facility];  Service: Gynecology;  Laterality: N/A;  . SALPINGOOPHORECTOMY Bilateral 02/18/2017   Procedure: BILATERAL SALPINGO OOPHORECTOMY;  Surgeon: Molli Posey, MD;  Location: [facility];  Service: Gynecology;  Laterality: Bilateral;  . TUBAL LIGATION    . UNILATERAL SALPINGECTOMY Right 09/05/2015   Procedure: UNILATERAL SALPINGECTOMY;  Surgeon: Molli Posey, MD;  Location: [facility];  Service: Gynecology;  Laterality: Right;  [name] VAGINAL HYSTERECTOMY N/A 09/05/2015   Procedure: HYSTERECTOMY VAGINAL;  Surgeon: Molli Posey, MD;  Location: [facility];  Service: Gynecology;  Laterality: N/A;  . WISDOM TOOTH EXTRACTION     Family History:  Family History  Problem Relation Age of Onset  . Hypertension Mother   . Cancer Mother 31       breast  . Diabetes Mother   . Hyperlipidemia Mother   .  Thyroid disease Mother   . Breast cancer Mother 57  . Hypertension Father   . Diabetes Father   . Cancer Maternal Grandmother        Breast  . Hyperlipidemia Maternal Grandmother   . Heart disease Maternal Grandmother   . Stroke Maternal Grandmother   . Hypertension Maternal Grandmother   . Breast  cancer Maternal Grandmother 32  . Hyperlipidemia Maternal Grandfather   . Hypertension Maternal Grandfather   . Cancer Paternal Grandmother        Colon  . Hyperlipidemia Paternal Grandmother   . Heart disease Paternal Grandmother   . Hyperlipidemia Paternal Grandfather   . Hypertension Paternal Grandfather    Social History:  History  Alcohol Use  . Yes    Comment: rare-occ. monthly     History  Drug Use No    Social History   Social History  . Marital status: Legally Separated    Spouse name: N/A  . Number of children: 4  . Years of education: N/A   Occupational History  . 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 Smoking status: Never Smoker  . Smokeless tobacco: Never Used  . Alcohol use Yes     Comment: rare-occ. monthly  . Drug use: No  . Sexual activity: Yes    Birth control/ protection: Surgical   Other Topics Concern  . None   Social History Narrative   [name] lives with her husband some of her children & 1 grand-son. She works FT as Audiological scientist, also teaches.   Additional Social History:   Sleep: improved  Appetite:  improved   Current Medications: Current Facility-Administered Medications  Medication Dose Route Frequency Provider Last Rate Last Dose  . acetaminophen (TYLENOL) tablet 650 mg  650 mg Oral Q6H PRN Ethelene Hal, NP   650 mg at 03/06/17 1327  . alum & mag hydroxide-simeth (MAALOX/MYLANTA) 200-200-20 MG/5ML suspension 30 mL  30 mL Oral Q4H PRN Ethelene Hal, NP      . Derrill Memo ON 03/09/2017] citalopram (CELEXA) tablet 20 mg  20 mg Oral Daily Derrill Center, NP      . cyclobenzaprine (FLEXERIL) tablet 10 mg  10 mg Oral TID PRN Lindell Spar I, NP   10 mg at 03/06/17 2057  . estradiol (CLIMARA - Dosed in mg/24 hr) patch 0.1 mg  0.1 mg Transdermal Once per day on Wed Sat Lindell Spar I, NP   0.1 mg at 03/08/17 5465  . estradiol (ESTRACE) tablet 0.5 mg  0.5 mg Oral Daily Nwoko, Agnes I, NP   0.5 mg at 03/08/17  0740  . gabapentin (NEURONTIN) capsule 200 mg  200 mg Oral BID Ethelene Hal, NP   200 mg at 03/08/17 0740  . hydrOXYzine (ATARAX/VISTARIL) tablet 25 mg  25 mg Oral Q6H PRN Ethelene Hal, NP   25 mg at 03/08/17 1054  . ibuprofen (ADVIL,MOTRIN) tablet 200 mg  200 mg Oral Q6H PRN Ethelene Hal, NP   200 mg at 03/08/17 0743  . LORazepam (ATIVAN) tablet 1 mg  1 mg Oral Q4H PRN Ethelene Hal, NP   1 mg at 03/06/17 0354  . magnesium hydroxide (MILK OF MAGNESIA) suspension 30 mL  30 mL Oral Daily PRN Ethelene Hal, NP      . ondansetron [facility]) tablet 4 mg  4 mg Oral Q8H PRN Ethelene Hal, NP   4 mg at 03/06/17 6568  . traZODone (DESYREL) tablet  50 mg  50 mg Oral QHS PRN Ethelene Hal, NP   50 mg at 03/07/17 2130    Lab Results:  Results for orders placed or performed during the hospital encounter of 03/05/17 (from the past 48 hour(s))  TSH     Status: None   Collection Time: 03/07/17  6:02 AM  Result Value Ref Range   TSH 0.966 0.350 - 4.500 uIU/mL    Comment: Performed by a 3rd Generation assay with a functional sensitivity of <=0.01 uIU/mL. Performed at [facility], [place] [address], [place],  [address]     Blood Alcohol level:  Lab Results  Component Value Date   ETH <5 47/82/9562    Metabolic Disorder Labs: Lab Results  Component Value Date   HGBA1C 5.7 (H) 08/18/2014   MPG 117 (H) 08/18/2014   MPG 123 (H) 10/18/2013   No results found for: PROLACTIN Lab Results  Component Value Date   CHOL 162 08/18/2014   TRIG 71 08/18/2014   HDL 47 08/18/2014   CHOLHDL 3.4 08/18/2014   VLDL 14 08/18/2014   LDLCALC 101 (H) 08/18/2014   LDLCALC 77 10/18/2013    Physical Findings: AIMS: Facial and Oral Movements Muscles of Facial Expression: None, normal Lips and Perioral Area: None, normal Jaw: None, normal Tongue: None, normal,Extremity Movements Upper (arms, wrists, hands, fingers): None,  normal Lower (legs, knees, ankles, toes): None, normal, Trunk Movements Neck, shoulders, hips: None, normal, Overall Severity Severity of abnormal movements (highest score from questions above): None, normal Incapacitation due to abnormal movements: None, normal Patient's awareness of abnormal movements (rate only patient's report): No Awareness, Dental Status Current problems with teeth and/or dentures?: No Does patient usually wear dentures?: No  CIWA:    COWS:     Musculoskeletal: Strength & Muscle Tone: within normal limits Gait & Station: normal Patient leans: N/A  Psychiatric Specialty Exam: Physical Exam  Vitals reviewed. Constitutional: She is oriented to person, place, and time. She appears well-developed.  HENT:  Head: Normocephalic.  Cardiovascular: Normal rate.   Neurological: She is alert and oriented to person, place, and time.  Psychiatric: She has a normal mood and affect. Her behavior is normal.    Review of Systems  Psychiatric/Behavioral: Positive for depression (improving ) and substance abuse. The patient is not nervous/anxious.    at this time denies headache, denies chest pain, no shortness of breath, no vomiting   Blood pressure (!) 126/92, pulse 93, temperature 98.5 F (36.9 C), temperature source Oral, resp. rate 18, height 5\' 4"  (1.626 m), weight 68 kg (150 lb), last menstrual period 10/24/2014, SpO2 99 %.Body mass index is 25.75 kg/m.  General Appearance: Casual  Eye Contact:  Good  Speech:  Normal Rate  Volume:  Normal  Mood:  Anxious and Irritable  Affect:  Labile  Thought Process:  Coherent and Linear  Orientation:  Full (Time, Place, and Person)  Thought Content:  Hallucinations: None  Suicidal Thoughts:  No   Homicidal Thoughts:  No  Memory:  Immediate;   Fair Recent;   Fair Remote;   Fair  Judgement:  Other:  improving   Insight:  Fair and improving   Psychomotor Activity:  Normal  Concentration:  Concentration: Good and Attention  Span: Good  Recall:  Good  Fund of Knowledge:  Good  Language:  Good  Akathisia:  Negative  Handed:  Right  AIMS (if indicated):     Assets:  Desire for Improvement Resilience Social Support  ADL's:  Intact  Cognition:  WNL  Sleep:  Number of Hours: 5.75     I agree with current treatment plan on 03/08/2017, Patient seen face-to-face for psychiatric evaluation follow-up, chart reviewed. Reviewed the information documented and agree with the treatment plan.  Treatment Plan Summary: Daily contact with patient to assess and evaluate symptoms and progress in treatment, Medication management, Plan inpatient treatment  and medications as below   Encourage group and milieu participation to work on coping skills and symptom reduction Continue Celexa 10 mgrs QDAY for depression and anxiety Continue Neurontin 200 mgrs BID for anxiety, pain Continue Vistaril 25 mgrs Q6 hours PRN for anxiety as needed  Continue Trazodone 50 mgrs QHS PRN for insomnia Treatment team working on disposition planning options- would consider weekend discharge if she continues to improve/stabilize.   Derrill Center, NP 03/08/2017, 4:04 PM  Notes reviewed agree with plan

## 2017-03-08 NOTE — [facility] Group Notes (Signed)
Adult Therapy Group Note (Clinical Social Work)  Date:  03/08/2017  Time:  10:00-11:00AM  Group Topic/Focus:  HEALTHY COPING SKILLS  Today's group focused on identifying healthy coping skills already in use by each patient, as well as healthy coping skills they would like to learn.  There was much sharing, support, psychoeducation, and encouragement provided.  Participation Level:  Active  Participation Quality:  Attentive and Sharing  Affect:  Irritable  Cognitive:  Appropriate  Insight: Improving  Engagement in Group:  Improving  Modes of Intervention:  Discussion, Support and Processing  Additional Comments:  The patient shared that an unhealthy coping technique she uses is alcohol abuse, while a healthy coping technique is spending time outside in fresh air and sunshine.  She was initially very active and verbal in group, supportive of others, but then she left the group after about 35 minutes and did not return.  Berlin Hun Grossman-Orr 02/08/2017, 1:28 PM

## 2017-03-08 NOTE — [facility] Group Notes (Signed)
[place] Group Notes:  (Nursing/MHT/Case Management/Adjunct)  Date:  03/08/2017  Time:  6:01 PM  Type of Therapy:  Nurse Education  Participation Level:  Active  Participation Quality:  Intrusive and Monopolizing  Affect:  Angry and Anxious  Cognitive:  Alert and Appropriate  Insight:  Appropriate  Engagement in Group:  Defensive and Engaged  Modes of Intervention:  Discussion  Summary of Progress/Problems:   Group addressed coping skills related to depression and anxiety, specifically dealing with stressful situations.    Cheri Kearns 03/08/2017, 6:01 PM

## 2017-03-08 NOTE — Plan of Care (Signed)
Problem: Coping: Goal: Ability to identify and develop effective coping behavior will improve Outcome: Progressing PAtient hs been refusing to use any coping skills, other than being very vocal about her feelings.

## 2017-03-08 NOTE — Progress Notes (Signed)
D.  Pt pleasant on approach, complaint of feeling "fidgety" .  Pt states that she has ADD and when she is not in the hospital she can run and do sports to alleviate it but here it is more difficult.  Pt was positive for evening wrap up group, interacting appropriately with peers on the unit.  Pt denies SI/HI/AVH at this time.  A.  Support and encouragement offered, medication given as ordered.  R.  Pt remains safe on the unit, will continue to monitor.

## 2017-03-09 DIAGNOSIS — F3160 Bipolar disorder, current episode mixed, unspecified: Principal | ICD-10-CM

## 2017-03-09 DIAGNOSIS — Z789 Other specified health status: Secondary | ICD-10-CM

## 2017-03-09 DIAGNOSIS — Z7289 Other problems related to lifestyle: Secondary | ICD-10-CM

## 2017-03-09 MED ORDER — GABAPENTIN 100 MG PO CAPS: 200.0000 mg | ORAL_CAPSULE | Freq: Two times a day (BID) | ORAL | 0 refills | Status: DC

## 2017-03-09 MED ORDER — TRAZODONE HCL 50 MG PO TABS: 50.0000 mg | ORAL_TABLET | Freq: Every evening | ORAL | 0 refills | Status: DC | PRN

## 2017-03-09 MED ORDER — CITALOPRAM HYDROBROMIDE 20 MG PO TABS: 20.0000 mg | ORAL_TABLET | Freq: Every day | ORAL | 0 refills | Status: DC

## 2017-03-09 MED ORDER — ESTRADIOL 0.5 MG PO TABS: 0.5000 mg | ORAL_TABLET | Freq: Every day | ORAL | 0 refills | Status: DC

## 2017-03-09 NOTE — BHH Suicide Risk Assessment (Signed)
[place] INPATIENT:  Family/Significant Other Suicide Prevention Education  Suicide Prevention Education:  Education Completed; Boyfriend, Aslyn Cottman, [phone] has been identified by the patient as the family member/significant other with whom the patient will be residing, and identified as the person(s) who will aid the patient in the event of a mental health crisis (suicidal ideations/suicide attempt).  With written consent from the patient, the family member/significant other has been provided the following suicide prevention education, prior to the and/or following the discharge of the patient.  Both patient and boyfriend were provided SPE individually.  Both are paramedics and familiar with much of this material.  However, they listened intently and responded appropriately.  Boyfriend described her as being "normally wide open, very energetic, doing a lot, an outdoors person, wanting to kayak later today."  He has removed all guns from the home, taken them to a friend's gun shop and asked that they be sold so as not to return to the home.  He has poured out all remaining alcohol in the home so that there is none left.  He has made arrangements for himself, patient's daughters, or paramedic friends to be with her 24/7 for the next 2 weeks until she is completely stabilized.  He understands she is better, but not could not completely back to her normal self and will have to continue to heal.  He stated she has never before said anything about being suicidal until her ovaries were removed.  He feels she is safe to return home now and in fact thinks that staying longer would start to have a negative effect on her healing.  The suicide prevention education provided includes the following:  Suicide risk factors  Suicide prevention and interventions  National Suicide Hotline telephone number  [facility] assessment telephone number  [facility] Emergency Assistance  [place] and/or Residential Mobile Crisis Unit telephone number  Request made of family/significant other to:  Remove weapons (e.g., guns, rifles, knives), all items previously/currently identified as safety concern.    Remove drugs/medications (over-the-counter, prescriptions, illicit drugs), all items previously/currently identified as a safety concern.  The family member/significant other verbalizes understanding of the suicide prevention education information provided.  The family member/significant other agrees to remove the items of safety concern listed above.  Berlin Hun Grossman-Orr 03/09/2017, 8:52 AM

## 2017-03-09 NOTE — Progress Notes (Signed)
Patient verbalizes readiness for discharge. Follow up plan explained, AVS, transition record and SRA given along with prescriptions. Home estrogen medications (pills and patches) returned to patient. No belongings to return as patient had none on admit and none brought in. Patient verbalizes understanding. Denies SI/HI and assures this Probation officer she will seek assistance should that change. Patient discharged ambulatory and in stable condition to boyfriend.

## 2017-03-09 NOTE — BHH Group Notes (Signed)
[facility] Group Therapy Notes:  (Clinical Social Work)   02/16/2017    1:00-2:00PM  Summary of Progress/Problems:   The main focus of today's process group was to   1)  Identify current healthy supports  2)  discuss importance of adding supports to stay well once out of the hospital  3)  generate ideas about what healthy supports can be added   An emphasis was placed on using counselor, doctor, therapy groups, 12-step groups, and problem-specific support groups to expand supports.    The patient stated her current healthy supports include her boyfriend, her husband, her friends, and the EMS/firefighting family that has agreed to come sit with her for the next 2 weeks while she remains out of work.  She wants to get better information about depression, suicidality, mental illness in general and any future medical procedures she has to have in order to know how to prevent negative consequences.  Type of Therapy:  Process Group with Motivational Interviewing  Participation Level:  Active  Participation Quality:  Attentive, Sharing and Supportive  Affect:  Excited  Cognitive:  Appropriate  Insight:  Distracting  Engagement in Therapy:  Engaged  Modes of Intervention:   Education, Support and Processing  Selmer Dominion, LCSW 02/16/2017    2:17 PM

## 2017-03-09 NOTE — Discharge Summary (Signed)
Physician Discharge Summary Note  Patient:  Theresa [name] is an 46 y.o., female MRN:  [id number] DOB:  06-02-1971 Patient phone:  [phone] (home)  Patient address:   [place] [address],  Total Time spent with patient: 30 minutes  Date of Admission:  03/05/2017 Date of Discharge: 03/09/2017  Reason for Admission: Per HPI-46 year old female,presented to ED voluntarily for worsening mood, depression, recent suicidal ideations and recent heavy alcohol consumption.She  states she has chronic medical illnesses . Reports history of Gastric Bypass, Chronic Anemia, Pelvic Pain . Earlier this month ( June 12th)  she had bilateral oophorectomy, which she states was made difficult due to adhesions . States that over recent days , after her surgery, she has been feeling more depressed, labile, " crying all the time", and has developed suicidal ideations. She has also been drinking heavily and daily over the last several days.  She states " I do not normally drink , I am not an alcoholic, but I did drink a lot over the weekend ".  She reports that a few days ago, while intoxicated, she made suicidal statements, took one of her BFMonroe firearms, and apparently did discharge a firearm.She also states " apparently I took off driving "  States her memory of these events is very limited  and fragmented. Reports she thinks she is facing a DUI charge due to this event. She states " even after I stopped drinking I have been feeling depressed, and yesterday I was still feeling suicidal".Last drank 2 days ago- admission BAL is < 5, UDS is positive for Opiates . Regarding opiates " I have been taking Percocet after the surgery, but I think they are making me feel fuzzy as well, I don't want to continue taking them". She states she has not been on any psychiatric medications over recent years. Of note, states she had been feeling relatively well and stable prior to recent surgery.  Principal Problem:  Bipolar affective disorder, current episode mixed [facility]) Discharge Diagnoses: Patient Active Problem List   Diagnosis Date Noted  . Bipolar affect, depressed ([place]) [F31.30] 03/05/2017  . Chronic post-traumatic stress disorder (PTSD) [F43.12] 03/05/2017  . Bipolar affective disorder, current episode mixed ([place]) [F31.60] 03/05/2017  . Pelvic pain [R10.2] 02/18/2017  . Left breast abscess [N61.1] 01/22/2017  . Adenomyosis [N80.0] 09/05/2015  . Health care maintenance [Z00.00] 04/24/2015  . Anemia [D64.9] 04/20/2015  . Menorrhagia [N92.0] 03/01/2015  . Hyperglycemia [R73.9] 08/18/2014  . NASH (nonalcoholic steatohepatitis) [K75.81] 08/18/2014  . S/P gastric bypass [Z98.84] 12/06/2013  . Hyperlipidemia [E78.5] 01/05/2013    Past Psychiatric History:   Past Medical History:  Past Medical History:  Diagnosis Date  . Anemia   . Attention deficit disorder   . Bipolar disorder ([place])    currently no meds- agitation noted during PAT assessment  . GERD (gastroesophageal reflux disease)    resolved  . H/O transfusion    17 yrs ago postpartum  . Hyperlipidemia    resolved  . Hypertension    history of HTN prior to gastric bypass  . Hypoglycemia   . Left breast abscess 01/22/2017  . Sleep apnea, obstructive    cpap used auto settings, resolved gastric bypass    Past Surgical History:  Procedure Laterality Date  . ABDOMINAL HYSTERECTOMY    . ABDOMINOPLASTY    . AUGMENTATION MAMMAPLASTY Bilateral 2016  . CESAREAN SECTION    . GASTRIC ROUX-EN-Y N/A 12/06/2013   Procedure: LAPAROSCOPIC ROUX-EN-Y GASTRIC BYPASS  WITH UPPER ENDOSCOPY;  Surgeon: Gayland Curry, MD;  Location: [facility];  Service: General;  Laterality: N/A;  . INCISION AND DRAINAGE ABSCESS Left 01/22/2017   Procedure: INCISION AND DRAINAGE LEFT  BREAST ABSCESS;  Surgeon: Fanny Skates, MD;  Location: [facility];  Service: General;  Laterality: Left;  . LAPAROTOMY N/A 02/18/2017   Procedure: LAPAROTOMY;  Surgeon:  Molli Posey, MD;  Location: [facility];  Service: Gynecology;  Laterality: N/A;  . SALPINGOOPHORECTOMY Bilateral 02/18/2017   Procedure: BILATERAL SALPINGO OOPHORECTOMY;  Surgeon: Molli Posey, MD;  Location: [facility];  Service: Gynecology;  Laterality: Bilateral;  . TUBAL LIGATION    . UNILATERAL SALPINGECTOMY Right 09/05/2015   Procedure: UNILATERAL SALPINGECTOMY;  Surgeon: Molli Posey, MD;  Location: [facility];  Service: Gynecology;  Laterality: Right;  [name] VAGINAL HYSTERECTOMY N/A 09/05/2015   Procedure: HYSTERECTOMY VAGINAL;  Surgeon: Molli Posey, MD;  Location: [facility];  Service: Gynecology;  Laterality: N/A;  . WISDOM TOOTH EXTRACTION     Family History:  Family History  Problem Relation Age of Onset  . Hypertension Mother   . Cancer Mother 75       breast  . Diabetes Mother   . Hyperlipidemia Mother   . Thyroid disease Mother   . Breast cancer Mother 18  . Hypertension Father   . Diabetes Father   . Cancer Maternal Grandmother        Breast  . Hyperlipidemia Maternal Grandmother   . Heart disease Maternal Grandmother   . Stroke Maternal Grandmother   . Hypertension Maternal Grandmother   . Breast cancer Maternal Grandmother 82  . Hyperlipidemia Maternal Grandfather   . Hypertension Maternal Grandfather   . Cancer Paternal Grandmother        Colon  . Hyperlipidemia Paternal Grandmother   . Heart disease Paternal Grandmother   . Hyperlipidemia Paternal Grandfather   . Hypertension Paternal Grandfather    Family Psychiatric  History:  Social History:  History  Alcohol Use  . Yes    Comment: rare-occ. monthly     History  Drug Use No    Social History   Social History  . Marital status: Legally Separated    Spouse name: N/A  . Number of children: 4  . Years of education: N/A   Occupational History  . 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 Smoking status: Never Smoker  . Smokeless tobacco: Never Used  . Alcohol use Yes      Comment: rare-occ. monthly  . Drug use: No  . Sexual activity: Yes    Birth control/ protection: Surgical   Other Topics Concern  . None   Social History Narrative   Ms Theresa [name] lives with her husband some of her children & 1 grand-son. She works FT as Audiological scientist, also teaches.    Hospital Course:  Theresa [name] was admitted for Bipolar affective disorder, current episode mixed [facility])  and crisis management.  Pt was treated discharged with the medications listed below under Medication List.  Medical problems were identified and treated as needed.  Home medications were restarted as appropriate.  Improvement was monitored by observation and Theresa [name] daily report of symptom reduction.  Emotional and mental status was monitored by daily self-inventory reports completed by Theresa Shine and clinical staff.         MARYEM SHUFFLER was evaluated by the treatment team for stability and plans for continued recovery upon discharge. Estill Dooms  Theresa [name] 's motivation was an integral factor for scheduling further treatment. Employment, transportation, bed availability, health status, family support, and any pending legal issues were also considered during hospital stay. Pt was offered further treatment options upon discharge including but not limited to Residential, Intensive Outpatient, and Outpatient treatment.  ELEEN LITZ will follow up with the services as listed below under Follow Up Information.     Upon completion of this admission the patient was both mentally and medically stable for discharge denying suicidal/homicidal ideation, auditory/visual/tactile hallucinations, delusional thoughts and paranoia.    Theresa Shine responded well to treatment with Celexa 20 mg, Neurontin 200mg  and Trazodone 50 mg without adverse effects. Pt demonstrated improvement without reported or observed adverse effects to the point of stability appropriate for outpatient management. Reviewed CBC, CMP,  BAL, and UDS + Opiates; all unremarkable aside from noted exceptions.   Physical Findings: AIMS: Facial and Oral Movements Muscles of Facial Expression: None, normal Lips and Perioral Area: None, normal Jaw: None, normal Tongue: None, normal,Extremity Movements Upper (arms, wrists, hands, fingers): None, normal Lower (legs, knees, ankles, toes): None, normal, Trunk Movements Neck, shoulders, hips: None, normal, Overall Severity Severity of abnormal movements (highest score from questions above): None, normal Incapacitation due to abnormal movements: None, normal PatientMonroe awareness of abnormal movements (rate only patientMonroe report): No Awareness, Dental Status Current problems with teeth and/or dentures?: No Does patient usually wear dentures?: No  CIWA:    COWS:     Musculoskeletal: Strength & Muscle Tone: within normal limits Gait & Station: normal Patient leans: N/A  Psychiatric Specialty Exam: Physical Exam  Vitals reviewed. Constitutional: She is oriented to person, place, and time. She appears well-developed.  Cardiovascular: Normal rate.   Neurological: She is alert and oriented to person, place, and time.  Psychiatric: She has a normal mood and affect. Her behavior is normal.    Review of Systems  Psychiatric/Behavioral: Negative for depression (stable) and suicidal ideas. The patient is nervous/anxious (stable).     Blood pressure 133/84, pulse 98, temperature 99 F (37.2 C), resp. rate 18, height 5\' 4"  (1.626 m), weight 68 kg (150 lb), last menstrual period 10/24/2014, SpO2 99 %.Body mass index is 25.75 kg/m.    Have you used any form of tobacco in the last 30 days? (Cigarettes, Smokeless Tobacco, Cigars, and/or Pipes): No  Has this patient used any form of tobacco in the last 30 days? (Cigarettes, Smokeless Tobacco, Cigars, and/or Pipes)  No  Blood Alcohol level:  Lab Results  Component Value Date   ETH <5 56/43/3295    Metabolic Disorder Labs:  Lab  Results  Component Value Date   HGBA1C 5.7 (H) 08/18/2014   MPG 117 (H) 08/18/2014   MPG 123 (H) 10/18/2013   No results found for: PROLACTIN Lab Results  Component Value Date   CHOL 162 08/18/2014   TRIG 71 08/18/2014   HDL 47 08/18/2014   CHOLHDL 3.4 08/18/2014   VLDL 14 08/18/2014   LDLCALC 101 (H) 08/18/2014   LDLCALC 77 10/18/2013    See Psychiatric Specialty Exam and Suicide Risk Assessment completed by Attending Physician prior to discharge.  Discharge destination:  Home  Is patient on multiple antipsychotic therapies at discharge:  No   Has Patient had three or more failed trials of antipsychotic monotherapy by history:  No  Recommended Plan for Multiple Antipsychotic Therapies: NA  Discharge Instructions    Diet - low sodium heart healthy    Complete by:  As directed  Discharge instructions    Complete by:  As directed    Take all medications as prescribed. Keep all follow-up appointments as scheduled.  Do not consume alcohol or use illegal drugs while on prescription medications. Report any adverse effects from your medications to your primary care provider promptly.  In the event of recurrent symptoms or worsening symptoms, call 911, a crisis hotline, or go to the nearest emergency department for evaluation.   Increase activity slowly    Complete by:  As directed      Allergies as of 03/09/2017      Reactions   Other    Needs benadryl when given narcotics      Medication List    STOP taking these medications   cyclobenzaprine 10 MG tablet Commonly known as:  FLEXERIL   ibuprofen 200 MG tablet Commonly known as:  ADVIL,MOTRIN   oxyCODONE-acetaminophen 5-325 MG tablet Commonly known as:  PERCOCET/ROXICET   promethazine 25 MG tablet Commonly known as:  PHENERGAN     TAKE these medications     Indication  citalopram 20 MG tablet Commonly known as:  CELEXA Take 1 tablet (20 mg total) by mouth daily. Start taking on:  03/10/2017  Indication:   Posttraumatic Stress Disorder   estradiol 0.1 MG/24HR patch Commonly known as:  VIVELLE-DOT Place 1 patch onto the skin 2 (two) times a week. What changed:  Another medication with the same name was added. Make sure you understand how and when to take each.  Indication:  Deficiency of the Hormone Estrogen   estradiol 0.5 MG tablet Commonly known as:  ESTRACE Take 1 tablet (0.5 mg total) by mouth daily. Start taking on:  03/10/2017 What changed:  You were already taking a medication with the same name, and this prescription was added. Make sure you understand how and when to take each.  Indication:  Deficiency of the Hormone Estrogen   gabapentin 100 MG capsule Commonly known as:  NEURONTIN Take 2 capsules (200 mg total) by mouth 2 (two) times daily.  Indication:  Aggressive Behavior, Agitation, Alcohol Withdrawal Syndrome, Cocaine Dependence   traZODone 50 MG tablet Commonly known as:  DESYREL Take 1 tablet (50 mg total) by mouth at bedtime as needed for sleep.  Indication:  [place] [place] [facility] Follow up on 04/03/2017.   Why:  Initial appointment for medications management on July 24 at 9:20 AM.  Please call to cancel/reschedule if needed.  Bring photo ID and insurance card.  Arrive 15 minutes early.  Cancel within 24 hours if needed.  Contact information: [place] [address] [phone]        Suella Grove LCSW Follow up on 03/10/2017.   Why:  Initial appointment for therapy.at 2 PM on Monday July 2nd.  Please bring insurance card and hospital discharge paperwork to first appointment.  Call to cancel/reschedule if needed.   Contact information: [place]   Phone:  [phone] Fax:  [phone]          Follow-up recommendations:  Activity:  as tolerated Diet:  heart healthy  Comments:  Take all medications as prescribed. Keep all follow-up appointments as scheduled.   Do not consume alcohol or use illegal drugs while on prescription medications. Report any adverse effects from your medications to your primary care provider promptly.  In the event of recurrent symptoms or worsening symptoms, call 911, a crisis hotline, or go to  the nearest emergency department for evaluation.   Signed: Derrill Center, NP 03/09/2017, 9:47 AM  I have examined the patient and agree with the discharge plan and findings. I have also done suicide assessment on this patient

## 2017-03-09 NOTE — Progress Notes (Signed)
D: Spoke with patient 1:1 who is anxious for discharge. States she was expecting to leave yesterday, denies complaints and feels she is back at baseline. Received 3 phone calls already this AM from boyfriend, ex-husband and best friend "Abigail Butts" who all state that through their interactions with Theresa [name], they feel she is ready for discharge. (Did not discuss patient's care with "Abigail Butts" as she is not on consent list. Did receive information from her however.) Patient's affect animated, mood anxious but appropriate. Patient states she is high energy and asks that that energy not be perceived as manic behavior. "I get up at 5am everyday and run 3 miles. I then go to work and work until 10 or 11 o'clock at night." Rating depression, hopelessness and anxiety all at a 0/10. Rates sleep as good, appetite as good, energy as normal and concentration as good.  States goal for today is "going home. I was told I was being discharged today." Denies pain, physical problems.   A: Medicated per orders, no prns requested or required. Level III obs in place for safety. Emotional support offered and self inventory reviewed. Encouraged completion of Suicide Safety Plan and programming participation. Discussed patient's request for discharge with SW as MD is not yet here.   R: Patient verbalizes understanding of POC. Patient denies SI/HI/AVH and remains safe on level III obs. Will notify patient once discharges are identified.

## 2017-03-09 NOTE — Progress Notes (Signed)
  [facility] Adult Case Management Discharge Plan :  Will you be returning to the same living situation after discharge:  Yes,  with boyfriend At discharge, do you have transportation home?: Yes,  boyfriend to pick up Do you have the ability to pay for your medications: Yes,  no barriers  Release of information consent forms completed and turned in to Medical Records.  Patient to Follow up at: Follow-up Information    [facility] [place] [facility] Follow up on 04/03/2017.   Why:  Initial appointment for medications management on July 24 at 9:20 AM.  Please call to cancel/reschedule if needed.  Bring photo ID and insurance card.  Arrive 15 minutes early.  Cancel within 24 hours if needed.  Contact information: [place] [address] [phone]        Suella Grove LCSW Follow up on 03/10/2017.   Why:  Initial appointment for therapy.at 2 PM on Monday July 2nd.  Please bring insurance card and hospital discharge paperwork to first appointment.  Call to cancel/reschedule if needed.   Contact information: [place]   Phone:  [phone] Fax:  [phone]          Next level of care provider has access to [place] and Suicide Prevention discussed: Yes,  with boyfriend and with patient individually  Have you used any form of tobacco in the last 30 days? (Cigarettes, Smokeless Tobacco, Cigars, and/or Pipes): No  Has patient been referred to the Quitline?: N/A patient is not a smoker  Patient has been referred for addiction treatment: Yes  Maretta Los 03/09/2017, 8:08 AM

## 2017-03-09 NOTE — BHH Suicide Risk Assessment (Signed)
[facility] Discharge Suicide Risk Assessment   Principal Problem: Bipolar affective disorder, current episode mixed [facility]) Discharge Diagnoses:  Patient Active Problem List   Diagnosis Date Noted  . Bipolar affect, depressed ([place]) [F31.30] 03/05/2017  . Chronic post-traumatic stress disorder (PTSD) [F43.12] 03/05/2017  . Bipolar affective disorder, current episode mixed ([place]) [F31.60] 03/05/2017  . Pelvic pain [R10.2] 02/18/2017  . Left breast abscess [N61.1] 01/22/2017  . Adenomyosis [N80.0] 09/05/2015  . Health care maintenance [Z00.00] 04/24/2015  . Anemia [D64.9] 04/20/2015  . Menorrhagia [N92.0] 03/01/2015  . Hyperglycemia [R73.9] 08/18/2014  . NASH (nonalcoholic steatohepatitis) [K75.81] 08/18/2014  . S/P gastric bypass [Z98.84] 12/06/2013  . Hyperlipidemia [E78.5] 01/05/2013    Total Time spent with patient: 30 minutes  Musculoskeletal: Strength & Muscle Tone: within normal limits Gait & Station: normal Patient leans: no lean  Psychiatric Specialty Exam: Review of Systems  Cardiovascular: Negative for chest pain.  Psychiatric/Behavioral: Negative for suicidal ideas.    Blood pressure 133/84, pulse 98, temperature 99 F (37.2 C), resp. rate 18, height 5\' 4"  (1.626 m), weight 68 kg (150 lb), last menstrual period 10/24/2014, SpO2 99 %.Body mass index is 25.75 kg/m.  General Appearance: Casual  Eye Contact::  Fair  Speech:  Normal Rate409  Volume:  Normal  Mood:  Euthymic  Affect:  Congruent  Thought Process:  Goal Directed  Orientation:  Full (Time, Place, and Person)  Thought Content:  Rumination  Suicidal Thoughts:  No  Homicidal Thoughts:  No  Memory:  Immediate;   Fair Recent;   Fair  Judgement:  Fair  Insight:  Fair  Psychomotor Activity:  Normal  Concentration:  Fair  Recall:  [employer] of Knowledge:Fair  Language: Fair  Akathisia:  Negative  Handed:  Right  AIMS (if indicated):     Assets:  Desire for Improvement  Sleep:  Number of Hours: 5.75   Cognition: WNL  ADL's:  Intact   Mental Status Per Nursing Assessment::   On Admission:  Suicidal ideation indicated by patient, Suicide plan, Plan includes specific time, place, or method, Self-harm thoughts, Self-harm behaviors, Intention to act on suicide plan, Belief that plan would result in death  Demographic Factors:  Female with good support system now  Loss Factors: Financial problems/change in socioeconomic status  Historical Factors: Impulsivity  Risk Reduction Factors:   Positive therapeutic relationship and Positive coping skills or problem solving skills  Continued Clinical Symptoms:  Alcohol/Substance Abuse/Dependencies  Cognitive Features That Contribute To Risk:  None    Suicide Risk:  Minimal: No identifiable suicidal ideation.  Patients presenting with no risk factors but with morbid ruminations; may be classified as minimal risk based on the severity of the depressive symptoms  Follow-up Information    [facility] up on 04/03/2017.   Why:  Initial appointment for medications management on July 24 at 9:20 AM.  Please call to cancel/reschedule if needed.  Bring photo ID and insurance card.  Arrive 15 minutes early.  Cancel within 24 hours if needed.  Contact information: [place] [address] [phone]        [place] [place] LCSW Follow up on 03/10/2017.   Why:  Initial appointment for therapy.at 2 PM on Monday July 2nd.  Please bring insurance card and hospital discharge paperwork to first appointment.  Call to cancel/reschedule if needed.   Contact information: [place]   Phone:  [phone] Fax:  [phone]          Plan  Of Care/Follow-up recommendations:   See discharge summary for details Firearms removed. See social worker note BF and family supportive to todays discharge  Patient much improved and not on percocet. No withdrawals off alcohol Activity:  as  tolerated Diet:  regular  De Nurse, Tyrina Hines, MD 03/09/2017, 11:08 AM

## 2017-03-24 ENCOUNTER — Other Ambulatory Visit: Payer: Self-pay | Admitting: Family

## 2017-04-11 ENCOUNTER — Encounter: Payer: Self-pay | Admitting: Nurse Practitioner

## 2017-04-11 ENCOUNTER — Ambulatory Visit (INDEPENDENT_AMBULATORY_CARE_PROVIDER_SITE_OTHER): Payer: BC Managed Care – PPO | Admitting: Nurse Practitioner

## 2017-04-11 VITALS — BP 121/79 | HR 93 | Temp 98.3°F | Ht 64.0 in | Wt 162.4 lb

## 2017-04-11 DIAGNOSIS — N611 Abscess of the breast and nipple: Secondary | ICD-10-CM | POA: Diagnosis not present

## 2017-04-11 DIAGNOSIS — Z7689 Persons encountering health services in other specified circumstances: Secondary | ICD-10-CM | POA: Diagnosis not present

## 2017-04-11 MED ORDER — SULFAMETHOXAZOLE-TRIMETHOPRIM 800-160 MG PO TABS: 1.0000 | ORAL_TABLET | Freq: Two times a day (BID) | ORAL | 0 refills | Status: AC

## 2017-04-11 NOTE — Patient Instructions (Addendum)
[place], Thank you for coming in to clinic today.  1. Psych Counseling ONLY Self Referral: 1. [facility] [place]   Address: [address], [place], [place] [address] Hours: Open today  9AM-7PM Phone: [phone]  2. [place] [place] Address: [address] [place], [place], [place] [address] Phone: [phone]    2. For your breast: - Continue Bactrim 800-160 mg tablet twice daily for 8 more days. - If resolves, you can cancel the [place]. If not resolved, continue with ultrasound.  If inflamed again, will need surgical incision and drainage w/ surgeon.  You will be due for FASTING BLOOD WORK (no food or drink after midnight before, only water or coffee without cream/sugar on the morning of)  - Please go ahead and schedule a "Lab Only" visit in the morning at the clinic for lab draw in 2-3 days before next Annual Physical  For Lab Results, once available within 2-3 days of blood draw, you can can log in to MyChart online to view your results and a brief explanation. Also, we can discuss results at next follow-up visit.    Please schedule a follow-up appointment with Cassell Smiles, AGNP. Return as needed for recurrence of symptoms.  Schedule an annual physical in about 4 weeks.  If you have any other questions or concerns, please feel free to call the clinic or send a message through [place]. You may also schedule an earlier appointment if necessary.  You will receive a survey after today's visit either digitally by e-mail or paper by [employer]. Your experiences and feedback matter to [place].  Please respond so we know how we are doing as we provide care for you.   Cassell Smiles, DNP, AGNP-BC Adult Gerontology Nurse Practitioner [place]

## 2017-04-11 NOTE — Progress Notes (Signed)
Subjective:    Patient ID: Theresa [name], female    DOB: [birth year], 46 y.o.   MRN: [id number]  Theresa [name] is a 46 y.o. female presenting on 04/11/2017 for Establish Care and Breast Mass (R sore, tender to the touch.)   HPI Establish Care New Provider Pt last seen by PCP last week at [facility] 6 months ago for PAP.    Recent medical history: She has had hysterectomy for dysmenorrhea and has had complications w/ adhesions requiring subsequent oophrectomy.  She was not bridged with any estrogen replacement and had suicide attempt.  Now well connected with mental health. She is seeing therapist Isa Rankin once weekly, [facility] Dr. Kasandra Knudsen.  She admits no recurrent SI or attempts to commit suicide.  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    New headaches since hysterectomy - Complications occurred postop as above w/o estrogen replacement.  Started estrogen patch - too much hormone and is now dealing with moderate menopausal symptoms and low dose estrogen po managed by OBGYN.   Right Breast Mass First noticed 2 days ago. Erythema, tenderness and soreness for last 2 days. Has taken bactrim twice daily x 2 days which was remaining after recent left breast abscess.  After antibiotics, notes mass has decreased in size today.  Left breast abscess was negative for MRSA.  Pt notes similar shape of oblong, cystic mass at that time as today.  Past Medical History:  Diagnosis Date  . Anemia   . Anxiety   . Attention deficit disorder   . Bipolar disorder ([place])    currently no meds- agitation noted during PAT assessment  . Depression   . GERD (gastroesophageal reflux disease)    resolved  . H/O transfusion    17 yrs ago postpartum  . Hyperlipidemia    resolved  . Hypertension    history of HTN prior to gastric bypass  . Hypoglycemia   . Left breast abscess 01/22/2017  . Sleep apnea, obstructive    cpap used auto settings, resolved gastric bypass     Past Surgical History:  Procedure Laterality Date  . ABDOMINAL HYSTERECTOMY    . ABDOMINOPLASTY    . AUGMENTATION MAMMAPLASTY Bilateral 2016  . CESAREAN SECTION    . GASTRIC ROUX-EN-Y N/A 12/06/2013   Procedure: LAPAROSCOPIC ROUX-EN-Y GASTRIC BYPASS WITH UPPER ENDOSCOPY;  Surgeon: Gayland Curry, MD;  Location: [facility];  Service: General;  Laterality: N/A;  . INCISION AND DRAINAGE ABSCESS Left 01/22/2017   Procedure: INCISION AND DRAINAGE LEFT  BREAST ABSCESS;  Surgeon: Fanny Skates, MD;  Location: [facility];  Service: General;  Laterality: Left;  . LAPAROTOMY N/A 02/18/2017   Procedure: LAPAROTOMY;  Surgeon: Molli Posey, MD;  Location: [facility];  Service: Gynecology;  Laterality: N/A;  . SALPINGOOPHORECTOMY Bilateral 02/18/2017   Procedure: BILATERAL SALPINGO OOPHORECTOMY;  Surgeon: Molli Posey, MD;  Location: [facility];  Service: Gynecology;  Laterality: Bilateral;  . TUBAL LIGATION    . UNILATERAL SALPINGECTOMY Right 09/05/2015   Procedure: UNILATERAL SALPINGECTOMY;  Surgeon: Molli Posey, MD;  Location: [facility];  Service: Gynecology;  Laterality: Right;  [name] VAGINAL HYSTERECTOMY N/A 09/05/2015   Procedure: HYSTERECTOMY VAGINAL;  Surgeon: Molli Posey, MD;  Location: [facility];  Service: Gynecology;  Laterality: N/A;  . WISDOM TOOTH EXTRACTION     Social History   Social History  . Marital status: Legally Separated    Spouse name: N/A  . Number of children: 4  .  Years of education: N/A   Occupational History  . 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 Smoking status: Never Smoker  . Smokeless tobacco: Never Used  . Alcohol use Yes     Comment: rare-occ. monthly  . Drug use: No  . Sexual activity: Yes    Birth control/ protection: Surgical   Other Topics Concern  . Not on file   Social History Narrative   Ms Theresa [name] lives with her husband some of her children & 1 grand-son. She works FT as Audiological scientist, also teaches.   Family  History  Problem Relation Age of Onset  . Hypertension Mother   . Cancer Mother 57       breast  . Diabetes Mother   . Hyperlipidemia Mother   . Thyroid disease Mother   . Breast cancer Mother 37  . Depression Mother   . Hypertension Father   . Diabetes Father   . Heart disease Father   . Stroke Father   . Depression Father   . Bipolar disorder Father   . Cancer Maternal Grandmother        Breast  . Hyperlipidemia Maternal Grandmother   . Heart disease Maternal Grandmother   . Stroke Maternal Grandmother   . Hypertension Maternal Grandmother   . Breast cancer Maternal Grandmother 26  . Hyperlipidemia Maternal Grandfather   . Hypertension Maternal Grandfather   . Cancer Paternal Grandmother        Colon  . Hyperlipidemia Paternal Grandmother   . Heart disease Paternal Grandmother   . Hyperlipidemia Paternal Grandfather   . Hypertension Paternal Grandfather    Current Outpatient Prescriptions on File Prior to Visit  Medication Sig  . estradiol (ESTRACE) 0.5 MG tablet Take 1 tablet (0.5 mg total) by mouth daily. (Patient taking differently: Take 1 mg by mouth 2 (two) times daily. )  . gabapentin (NEURONTIN) 100 MG capsule Take 2 capsules (200 mg total) by mouth 2 (two) times daily. (Patient taking differently: Take 300 mg by mouth 2 (two) times daily. )   No current facility-administered medications on file prior to visit.     Review of Systems  Constitutional: Negative.   HENT: Negative.   Eyes: Negative.   Respiratory: Negative.   Cardiovascular: Negative.   Gastrointestinal: Negative.   Endocrine: Negative.   Genitourinary: Positive for dyspareunia and vaginal pain.  Musculoskeletal: Negative.   Skin: Negative.   Allergic/Immunologic: Negative.   Neurological: Positive for headaches.  Hematological: Negative.    Per HPI unless specifically indicated above       Objective:    BP 121/79 (BP Location: Right Arm, Patient Position: Sitting, Cuff Size: Normal)    Pulse 93   Temp 98.3 F (36.8 C) (Oral)   Ht 5\' 4"  (1.626 m)   Wt 162 lb 6.4 oz (73.7 kg)   LMP 10/24/2014 (Approximate)   BMI 27.88 kg/m   Wt Readings from Last 3 Encounters:  04/11/17 162 lb 6.4 oz (73.7 kg)  02/18/17 162 lb 2 oz (73.5 kg)  02/13/17 162 lb 2 oz (73.5 kg)    Physical Exam  Constitutional: She is oriented to person, place, and time. She appears well-developed and well-nourished. No distress.  HENT:  Head: Normocephalic and atraumatic.  Mouth/Throat: Oropharynx is clear and moist.  Cardiovascular: Normal rate, regular rhythm, normal heart sounds and intact distal pulses.   Pulmonary/Chest: Effort normal and breath sounds normal. Right breast exhibits mass. Right breast exhibits no nipple discharge, no skin  change and no tenderness. Left breast exhibits no inverted nipple, no mass, no nipple discharge, no skin change and no tenderness. Breasts are symmetrical.    Breast implant in place  Lymphadenopathy:    She has no axillary adenopathy.  Neurological: She is alert and oriented to person, place, and time. No cranial nerve deficit.  Skin: Skin is warm and dry.  Psychiatric: She has a normal mood and affect. Her behavior is normal. Judgment and thought content normal.    Results for orders placed or performed during the hospital encounter of 03/04/17  Comprehensive metabolic panel  Result Value Ref Range   Sodium 140 135 - 145 mmol/L   Potassium 4.0 3.5 - 5.1 mmol/L   Chloride 105 101 - 111 mmol/L   CO2 26 22 - 32 mmol/L   Glucose, Bld 96 65 - 99 mg/dL   BUN 10 6 - 20 mg/dL   Creatinine, Ser 0.79 0.44 - 1.00 mg/dL   Calcium 8.9 8.9 - 10.3 mg/dL   Total Protein 7.4 6.5 - 8.1 g/dL   Albumin 4.5 3.5 - 5.0 g/dL   AST 30 15 - 41 U/L   ALT 37 14 - 54 U/L   Alkaline Phosphatase 87 38 - 126 U/L   Total Bilirubin 0.8 0.3 - 1.2 mg/dL   GFR calc non Af Amer >60 >60 mL/min   GFR calc Af Amer >60 >60 mL/min   Anion gap 9 5 - 15  Ethanol  Result Value Ref Range    Alcohol, Ethyl (B) <5 <5 mg/dL  Salicylate level  Result Value Ref Range   Salicylate Lvl <4.1 2.8 - 30.0 mg/dL  Acetaminophen level  Result Value Ref Range   Acetaminophen (Tylenol), Serum <10 (L) 10 - 30 ug/mL  cbc  Result Value Ref Range   WBC 6.6 4.0 - 10.5 K/uL   RBC 4.27 3.87 - 5.11 MIL/uL   Hemoglobin 12.9 12.0 - 15.0 g/dL   HCT 38.8 36.0 - 46.0 %   MCV 90.9 78.0 - 100.0 fL   MCH 30.2 26.0 - 34.0 pg   MCHC 33.2 30.0 - 36.0 g/dL   RDW 12.4 11.5 - 15.5 %   Platelets 311 150 - 400 K/uL  Rapid urine drug screen (hospital performed)  Result Value Ref Range   Opiates POSITIVE (A) NONE DETECTED   Cocaine NONE DETECTED NONE DETECTED   Benzodiazepines NONE DETECTED NONE DETECTED   Amphetamines NONE DETECTED NONE DETECTED   Tetrahydrocannabinol NONE DETECTED NONE DETECTED   Barbiturates NONE DETECTED NONE DETECTED      Assessment & Plan:   Problem List Items Addressed This Visit    None    Visit Diagnoses    Abscess of right breast    -  Primary Pt w/ new abscess of right breast. [place]blong in shape.  Responding to antibiotics pt started from prior left breast abscess.  Plan: 1. Continue Bactrim 800-160 mg bid x 8 more days for total 10 days therapy. 2. If persistent mass, proceed with breast ultrasound and consideration for I&D by general surgery. 3. Follow up as needed.   Relevant Medications   sulfamethoxazole-trimethoprim (BACTRIM DS) 800-160 MG tablet   Other Relevant Orders   US BREAST LTD UNI RIGHT INC AXILLA   Encounter to establish care     Pt establishing new PCP closer to home.  [facility].      Meds ordered this encounter  Medications  . ARIPiprazole (ABILIFY) 5 MG tablet  Sig: Take 5 mg by mouth daily.  . mirtazapine (REMERON) 7.5 MG tablet    Sig: Take 7.5 mg by mouth at bedtime.  . vitamin B-12 (CYANOCOBALAMIN) 1000 MCG tablet    Sig: Take 3,000 mcg by mouth daily.  . calcium citrate-vitamin D (CITRACAL+D) 315-200 MG-UNIT tablet     Sig: Take 2 tablets by mouth 2 (two) times daily.  [name] sulfamethoxazole-trimethoprim (BACTRIM DS) 800-160 MG tablet    Sig: Take 1 tablet by mouth 2 (two) times daily.    Dispense:  16 tablet    Refill:  0    Order Specific Question:   Supervising Provider    Answer:   Olin Hauser [[id number]]      Follow up plan: Return in about 4 weeks (around 05/09/2017) for annual physical. and as needed for persistent or worsening breast symptoms.  Cassell Smiles, DNP, AGPCNP-BC Adult Gerontology Primary Care Nurse Practitioner [facility] 04/11/2017, 11:18 AM

## 2017-04-11 NOTE — Progress Notes (Signed)
I have reviewed this encounter including the documentation in this note and/or discussed this patient with the provider, Cassell Smiles, AGPCNP-BC. I am certifying that I agree with the content of this note as supervising physician.  Nobie Putnam, [facility] 04/11/2017, 12:30 PM

## 2017-05-07 ENCOUNTER — Other Ambulatory Visit: Payer: Self-pay | Admitting: Nurse Practitioner

## 2017-05-07 DIAGNOSIS — Z0000 Encounter for general adult medical examination without abnormal findings: Secondary | ICD-10-CM

## 2017-05-08 ENCOUNTER — Other Ambulatory Visit: Payer: BC Managed Care – PPO

## 2017-05-09 ENCOUNTER — Other Ambulatory Visit: Payer: BC Managed Care – PPO

## 2017-05-09 DIAGNOSIS — Z0000 Encounter for general adult medical examination without abnormal findings: Secondary | ICD-10-CM

## 2017-05-09 LAB — CBC WITH DIFFERENTIAL/PLATELET
Basophils Absolute: 0 cells/uL (ref 0–200)
Basophils Relative: 0 %
Eosinophils Absolute: 144 cells/uL (ref 15–500)
Eosinophils Relative: 4 %
HCT: 39.2 % (ref 35.0–45.0)
Hemoglobin: 12.9 g/dL (ref 11.7–15.5)
Lymphocytes Relative: 36 %
Lymphs Abs: 1296 cells/uL (ref 850–3900)
MCH: 30.1 pg (ref 27.0–33.0)
MCHC: 32.9 g/dL (ref 32.0–36.0)
MCV: 91.6 fL (ref 80.0–100.0)
MPV: 10 fL (ref 7.5–12.5)
Monocytes Absolute: 252 cells/uL (ref 200–950)
Monocytes Relative: 7 %
Neutro Abs: 1908 cells/uL (ref 1500–7800)
Neutrophils Relative %: 53 %
Platelets: 207 10*3/uL (ref 140–400)
RBC: 4.28 MIL/uL (ref 3.80–5.10)
RDW: 12.8 % (ref 11.0–15.0)
WBC: 3.6 10*3/uL — ABNORMAL LOW (ref 3.8–10.8)

## 2017-05-10 LAB — LIPID PANEL
Cholesterol: 183 mg/dL (ref <200)
HDL: 64 mg/dL (ref >50)
LDL Cholesterol: 92 mg/dL (ref <100)
Total CHOL/HDL Ratio: 2.9 Ratio (ref <5.0)
Triglycerides: 136 mg/dL (ref <150)
VLDL: 27 mg/dL (ref <30)

## 2017-05-10 LAB — COMPREHENSIVE METABOLIC PANEL
ALT: 33 U/L — ABNORMAL HIGH (ref 6–29)
AST: 21 U/L (ref 10–30)
Albumin: 4.1 g/dL (ref 3.6–5.1)
Alkaline Phosphatase: 66 U/L (ref 33–115)
BUN: 8 mg/dL (ref 7–25)
CO2: 23 mmol/L (ref 20–32)
Calcium: 8.9 mg/dL (ref 8.6–10.2)
Chloride: 105 mmol/L (ref 98–110)
Creat: 0.79 mg/dL (ref 0.50–1.10)
Glucose, Bld: 87 mg/dL (ref 65–99)
Potassium: 4.1 mmol/L (ref 3.5–5.3)
Sodium: 140 mmol/L (ref 135–146)
Total Bilirubin: 0.5 mg/dL (ref 0.2–1.2)
Total Protein: 6.2 g/dL (ref 6.1–8.1)

## 2017-05-10 LAB — TSH: TSH: 0.83 mIU/L

## 2017-05-10 LAB — HEMOGLOBIN A1C
Hgb A1c MFr Bld: 4.9 % (ref <5.7)
Mean Plasma Glucose: 94 mg/dL

## 2017-05-14 ENCOUNTER — Ambulatory Visit (INDEPENDENT_AMBULATORY_CARE_PROVIDER_SITE_OTHER): Payer: BC Managed Care – PPO | Admitting: Nurse Practitioner

## 2017-05-14 ENCOUNTER — Encounter: Payer: Self-pay | Admitting: Nurse Practitioner

## 2017-05-14 VITALS — BP 132/83 | HR 71 | Temp 98.0°F | Ht 64.0 in | Wt 172.4 lb

## 2017-05-14 DIAGNOSIS — J00 Acute nasopharyngitis [common cold]: Secondary | ICD-10-CM

## 2017-05-14 DIAGNOSIS — Z0000 Encounter for general adult medical examination without abnormal findings: Secondary | ICD-10-CM | POA: Diagnosis not present

## 2017-05-14 MED ORDER — FLUTICASONE PROPIONATE 50 MCG/ACT NA SUSP: 2.0000 | Freq: Every day | NASAL | 6 refills | Status: DC

## 2017-05-14 NOTE — Progress Notes (Signed)
Subjective:    Patient ID: Theresa [name], female    DOB: 1971/08/10, 46 y.o.   MRN: [id number]  Theresa [name] is a 46 y.o. female presenting on 05/14/2017 for Annual Exam   HPI URI Started 2 days ago: Fever 101F, chills, body aches, sore throat, ear ache left ear. Sweats, but is surgically post-menopausal and thinks it is related to this. Denies congestion, cough, tooth/jaw pain, shortness of breath. Takes zyrtec daily.  Fever responds to tylenol and ibuprofen.  Annual Physical Exam Patient has been feeling unwell.  They have acute concerns today about URI. Sleeps 4-8 hours per night interrupted, but improving w/ remeron.  Depression improving.  Breast cyst resolved w/ antibiotics after last visit.  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  HEALTH MAINTENANCE: Weight/BMI: increasing some w/ psychiatric meds  Physical activity: Last 3 weeks almost none r/t hip injury (acupuncture), Previously 3x per week Diet: Few fried foods, meals from home, vegetables and fruits Seatbelt: always Sunscreen: Regularly PAP: Last was normal prior to hysterectomy Mammogram: Family history breast cancer - DONE in May 2018 Colon Cancer Screen: Grandmother had colon cancer at age 87, Not due now HIV: negative in March 2016 w/ no new sexual partners Optometry: No - has seen optometry in past Dentistry: Regular screenings  VACCINES: Tetanus: March 2016 Influenza: Needs flu vaccine, but defers for 2 weeks.     Past Medical History:  Diagnosis Date  . Anemia   . Anxiety   . Attention deficit disorder   . Bipolar disorder ([place])    currently no meds- agitation noted during PAT assessment  . Depression   . GERD (gastroesophageal reflux disease)    resolved  . H/O transfusion    17 yrs ago postpartum  . Hyperlipidemia    resolved  . Hypertension    history of HTN prior to gastric bypass  . Hypoglycemia   . Left breast  abscess 01/22/2017  . Sleep apnea, obstructive    cpap used auto settings, resolved gastric bypass   Past Surgical History:  Procedure Laterality Date  . ABDOMINAL HYSTERECTOMY    . ABDOMINOPLASTY    . AUGMENTATION MAMMAPLASTY Bilateral 2016  . CESAREAN SECTION    . GASTRIC ROUX-EN-Y N/A 12/06/2013   Procedure: LAPAROSCOPIC ROUX-EN-Y GASTRIC BYPASS WITH UPPER ENDOSCOPY;  Surgeon: Gayland Curry, MD;  Location: [facility];  Service: General;  Laterality: N/A;  . INCISION AND DRAINAGE ABSCESS Left 01/22/2017   Procedure: INCISION AND DRAINAGE LEFT  BREAST ABSCESS;  Surgeon: Fanny Skates, MD;  Location: [facility];  Service: General;  Laterality: Left;  . LAPAROTOMY N/A 02/18/2017   Procedure: LAPAROTOMY;  Surgeon: Molli Posey, MD;  Location: [facility];  Service: Gynecology;  Laterality: N/A;  . SALPINGOOPHORECTOMY Bilateral 02/18/2017   Procedure: BILATERAL SALPINGO OOPHORECTOMY;  Surgeon: Molli Posey, MD;  Location: [facility];  Service: Gynecology;  Laterality: Bilateral;  . TUBAL LIGATION    . UNILATERAL SALPINGECTOMY Right 09/05/2015   Procedure: UNILATERAL SALPINGECTOMY;  Surgeon: Molli Posey, MD;  Location: [facility];  Service: Gynecology;  Laterality: Right;  [name] VAGINAL HYSTERECTOMY N/A 09/05/2015   Procedure: HYSTERECTOMY VAGINAL;  Surgeon: Molli Posey, MD;  Location: [facility];  Service: Gynecology;  Laterality: N/A;  . WISDOM TOOTH EXTRACTION     Social History   Social History  . Marital status: Legally Separated    Spouse name: N/A  . Number of children: 4  .  Years of education: N/A   Occupational History  . 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 Smoking status: Never Smoker  . Smokeless tobacco: Never Used  . Alcohol use Yes     Comment: rare-occ. monthly  . Drug use: No  . Sexual activity: Yes    Birth control/ protection: Surgical   Other Topics Concern  . Not on file   Social History Narrative   [name] lives with her  husband some of her children & 1 grand-son. She works FT as Audiological scientist, also teaches.   Family History  Problem Relation Age of Onset  . Hypertension Mother   . Cancer Mother 4       breast  . Diabetes Mother   . Hyperlipidemia Mother   . Thyroid disease Mother   . Breast cancer Mother 22  . Depression Mother   . Hypertension Father   . Diabetes Father   . Heart disease Father   . Stroke Father   . Depression Father   . Bipolar disorder Father   . Cancer Maternal Grandmother        Breast  . Hyperlipidemia Maternal Grandmother   . Heart disease Maternal Grandmother   . Stroke Maternal Grandmother   . Hypertension Maternal Grandmother   . Breast cancer Maternal Grandmother 29  . Hyperlipidemia Maternal Grandfather   . Hypertension Maternal Grandfather   . Cancer Paternal Grandmother        Colon  . Hyperlipidemia Paternal Grandmother   . Heart disease Paternal Grandmother   . Hyperlipidemia Paternal Grandfather   . Hypertension Paternal Grandfather    Current Outpatient Prescriptions on File Prior to Visit  Medication Sig  . calcium citrate-vitamin D (CITRACAL+D) 315-200 MG-UNIT tablet Take 2 tablets by mouth 2 (two) times daily.  [name] estradiol (ESTRACE) 0.5 MG tablet Take 1 tablet (0.5 mg total) by mouth daily. (Patient taking differently: Take 1 mg by mouth 2 (two) times daily. )  . gabapentin (NEURONTIN) 100 MG capsule Take 2 capsules (200 mg total) by mouth 2 (two) times daily. (Patient taking differently: Take 300 mg by mouth 2 (two) times daily. )  . mirtazapine (REMERON) 7.5 MG tablet Take 7.5 mg by mouth at bedtime.  . vitamin B-12 (CYANOCOBALAMIN) 1000 MCG tablet Take 3,000 mcg by mouth daily.   No current facility-administered medications on file prior to visit.     Review of Systems Per HPI unless specifically indicated above      Objective:    BP 132/83 (BP Location: Right Arm, Patient Position: Sitting, Cuff Size: Normal)   Pulse 71   Temp 98 F (36.7  C) (Oral)   Ht '5\' 4"'  (1.626 m)   Wt 172 lb 6.4 oz (78.2 kg)   LMP 10/24/2014 (Approximate)   SpO2 100%   BMI 29.59 kg/m   Wt Readings from Last 3 Encounters:  05/14/17 172 lb 6.4 oz (78.2 kg)  04/11/17 162 lb 6.4 oz (73.7 kg)  02/18/17 162 lb 2 oz (73.5 kg)    Physical Exam  General - healthy, well-appearing, NAD HEENT - Normocephalic, atraumatic, PERRL, EOMI, patent nares w/ edema/erythema/congestion, oropharynx clear, MMM, left TM w/ effusion, not bulging/retracted. Right TM normal. Neck - supple, non-tender, mild cervical LAD, no thyromegaly Heart - RRR, no murmurs heard Lungs - Clear throughout all lobes, no wheezing, crackles, or rhonchi. Normal work of breathing. Abdomen - soft, NTND, no masses, no hepatosplenomegaly, active bowel sounds Breast - deferred as was performed  at last visit.  GU - derferred Extremeties - non-tender, no edema, cap refill < 2 seconds, peripheral pulses intact +2 bilaterally Skin - warm, dry, no rashes Neuro - awake, alert, oriented x3, CN II-X intact, intact muscle strength 5/5 bilaterally, intact distal sensation to light touch, normal coordination, normal gait Psych - Normal mood and affect, normal behavior    Results for orders placed or performed in visit on 05/09/17  CBC with Differential/Platelet  Result Value Ref Range   WBC 3.6 (L) 3.8 - 10.8 K/uL   RBC 4.28 3.80 - 5.10 MIL/uL   Hemoglobin 12.9 11.7 - 15.5 g/dL   HCT 39.2 35.0 - 45.0 %   MCV 91.6 80.0 - 100.0 fL   MCH 30.1 27.0 - 33.0 pg   MCHC 32.9 32.0 - 36.0 g/dL   RDW 12.8 11.0 - 15.0 %   Platelets 207 140 - 400 K/uL   MPV 10.0 7.5 - 12.5 fL   Neutro Abs 1,908 1,500 - 7,800 cells/uL   Lymphs Abs 1,296 850 - 3,900 cells/uL   Monocytes Absolute 252 200 - 950 cells/uL   Eosinophils Absolute 144 15 - 500 cells/uL   Basophils Absolute 0 0 - 200 cells/uL   Neutrophils Relative % 53 %   Lymphocytes Relative 36 %   Monocytes Relative 7 %   Eosinophils Relative 4 %   Basophils  Relative 0 %   Smear Review Criteria for review not met   Comprehensive metabolic panel  Result Value Ref Range   Sodium 140 135 - 146 mmol/L   Potassium 4.1 3.5 - 5.3 mmol/L   Chloride 105 98 - 110 mmol/L   CO2 23 20 - 32 mmol/L   Glucose, Bld 87 65 - 99 mg/dL   BUN 8 7 - 25 mg/dL   Creat 0.79 0.50 - 1.10 mg/dL   Total Bilirubin 0.5 0.2 - 1.2 mg/dL   Alkaline Phosphatase 66 33 - 115 U/L   AST 21 10 - 30 U/L   ALT 33 (H) 6 - 29 U/L   Total Protein 6.2 6.1 - 8.1 g/dL   Albumin 4.1 3.6 - 5.1 g/dL   Calcium 8.9 8.6 - 10.2 mg/dL  Hemoglobin A1c  Result Value Ref Range   Hgb A1c MFr Bld 4.9 <5.7 %   Mean Plasma Glucose 94 mg/dL  Lipid panel  Result Value Ref Range   Cholesterol 183 <200 mg/dL   Triglycerides 136 <150 mg/dL   HDL 64 >50 mg/dL   Total CHOL/HDL Ratio 2.9 <5.0 Ratio   VLDL 27 <30 mg/dL   LDL Cholesterol 92 <100 mg/dL  TSH  Result Value Ref Range   TSH 0.83 mIU/L      Assessment & Plan:   Problem List Items Addressed This Visit    None    Visit Diagnoses    Acute nasopharyngitis    -  Primary Acute illness. Fever responsive to NSAIDs and tylenol.  Symptoms not worsening. Consistent with viral illness x 2 days with no known sick contacts and no identifiable focal infections of ears, nose, throat.  Plan: 1. Reassurance, likely self-limited with cough lasting up to few weeks - Continue anti-histamine Cetirizine 62m daily,  - also can use Flonase 2 sprays each nostril daily for up to 4-6 weeks - Start Mucinex-DM OTC up to 7-10 days then stop 2. Supportive care with nasal saline, warm herbal tea with honey, 3. Improve hydration 4. Tylenol / Motrin PRN fevers 5. Return criteria given.  Will only  consider antibiotics at total of 7-10 days w/ illness or if changes in symptoms indicating focal infection.  Pt verbalizes understanding.    Relevant Medications   fluticasone (FLONASE) 50 MCG/ACT nasal spray   Encounter for annual physical exam     Physical exam  with no new findings except as defined by acute nasopharyngitis above.  Well adult with no other acute concerns.  Plan: 1. Obtain health maintenance screenings. 2. Return 1 year for annual physical.      Meds ordered this encounter  Medications  . ARIPiprazole (ABILIFY) 10 MG tablet    Refill:  0  . fluticasone (FLONASE) 50 MCG/ACT nasal spray    Sig: Place 2 sprays into both nostrils daily.    Dispense:  16 g    Refill:  6      Follow up plan: Return in about 1 year (around 05/14/2018) for annual physical and 2 weeks for influenza vaccine.  Cassell Smiles, DNP, AGPCNP-BC Adult Gerontology Primary Care Nurse Practitioner [facility] 05/16/2017, 1:04 PM

## 2017-05-14 NOTE — Patient Instructions (Addendum)
Lanae, Thank you for coming in to clinic today.  1. For your URI: 1. It sounds like you have a Upper Respiratory Virus - this will most likely run it's course in 7 to 10 days. Recommend good hand washing. - Continue anti-histamine cetirizine 10mg  daily - START Flonase 2 sprays each nostril daily for 2 weeks and up to 4-6 weeks - If congestion is worse, start OTC Mucinex (or may try Mucinex-DM for cough) up to 7-10 days then stop - Drink plenty of fluids to improve congestion - You may try over the counter Nasal Saline spray (Simply Saline, Ocean Spray) as needed to reduce congestion. - Drink warm herbal tea with honey for sore throat. - Start taking Tylenol extra strength 1 to 2 tablets every 6-8 hours for aches or fever/chills for next few days as needed.  Do not take more than 3,000 mg in 24 hours from all medicines.  May take Ibuprofen as well if tolerated 200-400mg  every 8 hours as needed.  If symptoms significantly worsening with persistent fevers/chills despite tylenol/ibpurofen, nausea, vomiting unable to tolerate food/fluids or medicine, body aches, or shortness of breath, sinus pain pressure or worsening productive cough, then follow-up for re-evaluation, may seek more immediate care at Urgent Care or ED if more concerned for emergency.   2. For your annual physical: - No new findings on exam today.  Please schedule a follow-up appointment with Cassell Smiles, AGNP. Return in about 1 year (around 05/14/2018) for annual physical and 2 weeks for influenza vaccine.   If another 5-7 days with cold symptoms, call clinic.   If you have any other questions or concerns, please feel free to call the clinic or send a message through [place]. You may also schedule an earlier appointment if necessary.  You will receive a survey after today's visit either digitally by e-mail or paper by [employer]. Your experiences and feedback matter to [place].  Please respond so we know how we are doing as we provide  care for you.   Cassell Smiles, DNP, AGNP-BC Adult Gerontology Nurse Practitioner [place]

## 2017-05-28 ENCOUNTER — Ambulatory Visit (INDEPENDENT_AMBULATORY_CARE_PROVIDER_SITE_OTHER): Payer: BC Managed Care – PPO

## 2017-05-28 ENCOUNTER — Other Ambulatory Visit: Payer: Self-pay

## 2017-05-28 VITALS — Temp 97.6°F

## 2017-05-28 DIAGNOSIS — N611 Abscess of the breast and nipple: Secondary | ICD-10-CM

## 2017-05-28 DIAGNOSIS — Z23 Encounter for immunization: Secondary | ICD-10-CM

## 2017-06-03 ENCOUNTER — Ambulatory Visit (INDEPENDENT_AMBULATORY_CARE_PROVIDER_SITE_OTHER): Payer: BC Managed Care – PPO | Admitting: Nurse Practitioner

## 2017-06-03 ENCOUNTER — Encounter: Payer: Self-pay | Admitting: Nurse Practitioner

## 2017-06-03 VITALS — BP 134/88 | HR 72 | Temp 97.6°F | Ht 64.0 in | Wt 177.4 lb

## 2017-06-03 DIAGNOSIS — F314 Bipolar disorder, current episode depressed, severe, without psychotic features: Secondary | ICD-10-CM

## 2017-06-03 DIAGNOSIS — D72819 Decreased white blood cell count, unspecified: Secondary | ICD-10-CM | POA: Diagnosis not present

## 2017-06-03 DIAGNOSIS — F5104 Psychophysiologic insomnia: Secondary | ICD-10-CM

## 2017-06-03 DIAGNOSIS — R1030 Lower abdominal pain, unspecified: Secondary | ICD-10-CM | POA: Diagnosis not present

## 2017-06-03 LAB — CBC WITH DIFFERENTIAL/PLATELET
Basophils Absolute: 20 cells/uL (ref 0–200)
Basophils Relative: 0.3 %
Eosinophils Absolute: 72 cells/uL (ref 15–500)
Eosinophils Relative: 1.1 %
HCT: 41.9 % (ref 35.0–45.0)
Hemoglobin: 13.8 g/dL (ref 11.7–15.5)
Lymphs Abs: 1762 cells/uL (ref 850–3900)
MCH: 30.1 pg (ref 27.0–33.0)
MCHC: 32.9 g/dL (ref 32.0–36.0)
MCV: 91.3 fL (ref 80.0–100.0)
MPV: 9.6 fL (ref 7.5–12.5)
Monocytes Relative: 4.8 %
Neutro Abs: 4336 cells/uL (ref 1500–7800)
Neutrophils Relative %: 66.7 %
Platelets: 209 10*3/uL (ref 140–400)
RBC: 4.59 10*6/uL (ref 3.80–5.10)
RDW: 11.7 % (ref 11.0–15.0)
Total Lymphocyte: 27.1 %
WBC mixed population: 312 cells/uL (ref 200–950)
WBC: 6.5 10*3/uL (ref 3.8–10.8)

## 2017-06-03 MED ORDER — BUPROPION HCL ER (SR) 100 MG PO TB12: 100.0000 mg | ORAL_TABLET | Freq: Two times a day (BID) | ORAL | 2 refills | Status: DC

## 2017-06-03 MED ORDER — SUVOREXANT 20 MG PO TABS: 20.0000 mg | ORAL_TABLET | Freq: Every evening | ORAL | 2 refills | Status: DC | PRN

## 2017-06-03 MED ORDER — MELOXICAM 7.5 MG PO TABS: 7.5000 mg | ORAL_TABLET | Freq: Every day | ORAL | 0 refills | Status: DC

## 2017-06-03 NOTE — Assessment & Plan Note (Signed)
Currently improving symptoms.  Notes significant weight gain and daytime drowsiness w/ Remeron.  Would like to change to belsomra.  Is only currently taking Remeron w/ abilify for mood stabilization.    Plan: 1. STOP remeron.  Prefer to maintain medication for moods w/ abilify. - START WellbutrinSR 100 mg twice daily. Discuss changes w/ [facility].  Consider paxil in future if needing assistance w/ postmenopausal symptoms w/ depression 2. Continue abilify w/o changes. 3. Continue psychology counseling. 4. Follow up as needed - defer primary management to [facility].

## 2017-06-03 NOTE — Assessment & Plan Note (Signed)
Recurrence of lower abdominal pain s/p hysterectomy and separate oophrectomy.  Pt has hx of adhesions and lysis w/ oophrectomy.  Currently concerned for recurrence of adhesions or another medical cause. - Differential Dx: adhesions, diverticulitis, serotonin imbalance w/ GI manifestation  Plan: 1. Abdominal CT scan evaluate anatomical changes, possible diverticulitis. 2. Manage bipolar affect, depression. 3. Consider GI referral if no additional cause identified. 4. Follow up 4 weeks.

## 2017-06-03 NOTE — Progress Notes (Signed)
 Subjective:    Patient ID: Theresa [name], female    DOB: 12/12/1970, 45 y.o.   MRN: [id number]  Theresa [name] is a 45 y.o. female presenting on 06/03/2017 for Abdominal Pain (constant lower abdominal pain )   HPI  Pt is accompanied by her boyfriend, Theresa [name].  Abdominal pain Occasionally dull, occasionally sharp, yesterday felt a constant "tearing sensation."  Not ovaries/uterus as pt is s/p hysterectomy and oophrectomy.  At oophrectomy procedure, noted to have significant adhesions w/ adhesion of left ovary to colon.  Today only constant dull pain w/ occasional sharp pain.  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 - Worse pain over last 2 days.  States diet seems to effect this. Increased carbs (simples) lead to worse pain. - Pain is constant dull even w/ proper eating. - Has had gastric bypass also. - Notes a more intense pain w/ urge to defecate. - No change in pain w/ mealtimes. - No blood in stool. - Of note, during oophrectomy, found that ovaries had attached to colon 2/2 adhesions.   - Family hx colon cancer: Grandmother had colon cancer age 70s - Is having the same left lower abdominal/left sided pelvic pain w/ intercourse also, which was reason for considering oophrectomy several months ago. Pt states if additional adhesions, will no consider repeat surgery.  Sleep Remeron - drowsy next day even w/ half tablet.  Has also tried Trazodone in past. Has had belsomra and helps w/ sleep w/o next day effects. Wishes to obtain refills on this medication.  Initially started w/ samples from OBGYN.  Pt prefers to have sleep managed here by PCP.  NCCSRS reviewed.  As reported by pt, no narcotic prescriptions were filled over the past 2 months.  Pt does not desire to resume opioid management of her pain.    Bipolar Disorder - Depression Irritability - improved less short temper Has panic attacks w/ celexa. Is  currently managed at [facility] [name].Pt states is going to start or has started taking Ritalin and has an appointment in the next couple of weeks.   Social History  Substance Use Topics  . Smoking status: Never Smoker  . Smokeless tobacco: Never Used  . Alcohol use Yes     Comment: rare-occ. monthly    Review of Systems Per HPI unless specifically indicated above     Objective:    BP 134/88 (BP Location: Right Arm, Patient Position: Sitting, Cuff Size: Normal)   Pulse 72   Temp 97.6 F (36.4 C) (Oral)   Ht 5' 4" (1.626 m)   Wt 177 lb 6.4 oz (80.5 kg)   LMP 10/24/2014 (Approximate)   BMI 30.45 kg/m   Wt Readings from Last 3 Encounters:  06/03/17 177 lb 6.4 oz (80.5 kg)  05/14/17 172 lb 6.4 oz (78.2 kg)  04/11/17 162 lb 6.4 oz (73.7 kg)    Physical Exam General - overweight, well-appearing, NAD HEENT - Normocephalic, atraumatic, PERRL, EOMI, patent nares w/o congestion, oropharynx clear, MMM Neck - supple, non-tender, no LAD Heart - RRR, no murmurs heard Lungs - Clear throughout all lobes, no wheezing, crackles, or rhonchi. Normal work of breathing. Abdomen - Increased pressure w/ palpation to LLQ, suprapubic area.  Otherwise soft, NTND, no masses, no hepatosplenomegaly, active bowel sounds, no guarding, no rebound tenderness. Extremeties - non-tender, no edema, cap refill < 2 seconds, peripheral pulses intact +2 bilaterally Skin - warm, dry, no   rashes Neuro - awake, alert, oriented x3, normal gait Psych - Normal mood and affect, normal behavior   Results for orders placed or performed in visit on 05/09/17  CBC with Differential/Platelet  Result Value Ref Range   WBC 3.6 (L) 3.8 - 10.8 K/uL   RBC 4.28 3.80 - 5.10 MIL/uL   Hemoglobin 12.9 11.7 - 15.5 g/dL   HCT 39.2 35.0 - 45.0 %   MCV 91.6 80.0 - 100.0 fL   MCH 30.1 27.0 - 33.0 pg   MCHC 32.9 32.0 - 36.0 g/dL   RDW 12.8 11.0 - 15.0 %   Platelets 207 140 - 400 K/uL   MPV 10.0 7.5 - 12.5 fL   Neutro Abs  1,908 1,500 - 7,800 cells/uL   Lymphs Abs 1,296 850 - 3,900 cells/uL   Monocytes Absolute 252 200 - 950 cells/uL   Eosinophils Absolute 144 15 - 500 cells/uL   Basophils Absolute 0 0 - 200 cells/uL   Neutrophils Relative % 53 %   Lymphocytes Relative 36 %   Monocytes Relative 7 %   Eosinophils Relative 4 %   Basophils Relative 0 %   Smear Review Criteria for review not met   Comprehensive metabolic panel  Result Value Ref Range   Sodium 140 135 - 146 mmol/L   Potassium 4.1 3.5 - 5.3 mmol/L   Chloride 105 98 - 110 mmol/L   CO2 23 20 - 32 mmol/L   Glucose, Bld 87 65 - 99 mg/dL   BUN 8 7 - 25 mg/dL   Creat 0.79 0.50 - 1.10 mg/dL   Total Bilirubin 0.5 0.2 - 1.2 mg/dL   Alkaline Phosphatase 66 33 - 115 U/L   AST 21 10 - 30 U/L   ALT 33 (H) 6 - 29 U/L   Total Protein 6.2 6.1 - 8.1 g/dL   Albumin 4.1 3.6 - 5.1 g/dL   Calcium 8.9 8.6 - 10.2 mg/dL  Hemoglobin A1c  Result Value Ref Range   Hgb A1c MFr Bld 4.9 <5.7 %   Mean Plasma Glucose 94 mg/dL  Lipid panel  Result Value Ref Range   Cholesterol 183 <200 mg/dL   Triglycerides 136 <150 mg/dL   HDL 64 >50 mg/dL   Total CHOL/HDL Ratio 2.9 <5.0 Ratio   VLDL 27 <30 mg/dL   LDL Cholesterol 92 <100 mg/dL  TSH  Result Value Ref Range   TSH 0.83 mIU/L      Assessment & Plan:   Problem List Items Addressed This Visit      Other   Bipolar affect, depressed ([facility])    Currently improving symptoms.  Notes significant weight gain and daytime drowsiness w/ Remeron.  Would like to change to belsomra.  Is only currently taking Remeron w/ abilify for mood stabilization.    Plan: 1. STOP remeron.  Prefer to maintain medication for moods w/ abilify. - START WellbutrinSR 100 mg twice daily. Discuss changes w/ Theresa [name].  Consider paxil in future if needing assistance w/ postmenopausal symptoms w/ depression 2. Continue abilify w/o changes. 3. Continue psychology counseling. 4. Follow up as needed - defer primary management  to Theresa [name].      Relevant Medications   buPROPion (WELLBUTRIN SR) 100 MG 12 hr tablet   Lower abdominal pain    Recurrence of lower abdominal pain s/p hysterectomy and separate oophrectomy.  Pt has hx of adhesions and lysis w/ oophrectomy.  Currently concerned for recurrence of adhesions or another medical cause. - Differential  Dx: adhesions, diverticulitis, serotonin imbalance w/ GI manifestation  Plan: 1. Abdominal CT scan evaluate anatomical changes, possible diverticulitis. 2. Manage bipolar affect, depression. 3. Consider GI referral if no additional cause identified. 4. Follow up 4 weeks.      Relevant Medications   meloxicam (MOBIC) 7.5 MG tablet   Other Relevant Orders   CT ABDOMEN PELVIS W CONTRAST    Other Visit Diagnoses    Psychophysiological insomnia    -  Primary   See AP for bipolar affect   Relevant Medications   Suvorexant (BELSOMRA) 20 MG TABS   Leukopenia, unspecified type       Low WBC on last labs.  Recheck for normal value today.   Relevant Orders   CBC with Differential/Platelet      Meds ordered this encounter  Medications  . buPROPion (WELLBUTRIN SR) 100 MG 12 hr tablet    Sig: Take 1 tablet (100 mg total) by mouth 2 (two) times daily.    Dispense:  60 tablet    Refill:  2  . meloxicam (MOBIC) 7.5 MG tablet    Sig: Take 1 tablet (7.5 mg total) by mouth daily.    Dispense:  30 tablet    Refill:  0  . Suvorexant (BELSOMRA) 20 MG TABS    Sig: Take 20 mg by mouth at bedtime as needed.    Dispense:  30 tablet    Refill:  2      Follow up plan: Return in about 4 weeks (around 07/01/2017) for abdominal pain.  Cassell Smiles, DNP, AGPCNP-BC Adult Gerontology Primary Care Nurse Practitioner [facility] 06/03/2017, 8:12 AM

## 2017-06-03 NOTE — Patient Instructions (Addendum)
Theresa [name], Thank you for coming in to clinic today.  1. For your abdominal pain: - Likely adhesions, but we cannot exclude other causes.  Abdominal CT scan within the week. - Continue your gabapentin. - START meloxicam 7.5 mg once daily as needed for your abdominal pain.  2. For your sleep and depression: - STOP Remeron - START Wellbutrin SR 100mg : Take 1 tablet once daily for 3 days. Then, take 1 tablet twice daily and continue. - START belsomra 20 mg once daily - TELL your psychiatrist about these changes.  Please schedule a follow-up appointment with Cassell Smiles, AGNP.  Return in about 4 weeks (around 07/01/2017) for abdominal pain.    If you have any other questions or concerns, please feel free to call the clinic or send a message through [place]. You may also schedule an earlier appointment if necessary.  You will receive a survey after today's visit either digitally by e-mail or paper by [employer]. Your experiences and feedback matter to [place].  Please respond so we know how we are doing as we provide care for you.   Cassell Smiles, DNP, AGNP-BC Adult Gerontology Nurse Practitioner [place]

## 2017-06-05 ENCOUNTER — Other Ambulatory Visit: Payer: Self-pay

## 2017-06-06 ENCOUNTER — Telehealth: Payer: Self-pay

## 2017-06-06 NOTE — Telephone Encounter (Signed)
Attempted to reach the pt to notify her of her appt change for her CT of Abdomen. No answer. I left a detail message on the pt personal vm of her appt changed w/  Scheduling phone number in cause she needs to rescheduled. The appt is scheduled at [facility] on 06/20/17 @ 10:00am.

## 2017-06-09 ENCOUNTER — Telehealth: Payer: Self-pay | Admitting: Nurse Practitioner

## 2017-06-09 NOTE — Telephone Encounter (Signed)
I would agree with patient.  Does not need any additional labs at this time.  Can cancel appointment.  Called and left message on voicemail.

## 2017-06-09 NOTE — Telephone Encounter (Signed)
Pt has upcoming appt for labs but said she did labs not too long ago.  Please call [phone] with results.

## 2017-06-11 ENCOUNTER — Ambulatory Visit: Payer: BC Managed Care – PPO

## 2017-06-16 ENCOUNTER — Telehealth: Payer: BC Managed Care – PPO | Admitting: Nurse Practitioner

## 2017-06-16 DIAGNOSIS — N3000 Acute cystitis without hematuria: Secondary | ICD-10-CM

## 2017-06-16 MED ORDER — NITROFURANTOIN MONOHYD MACRO 100 MG PO CAPS: 100.0000 mg | ORAL_CAPSULE | Freq: Two times a day (BID) | ORAL | 0 refills | Status: DC

## 2017-06-16 NOTE — Progress Notes (Signed)

## 2017-06-20 ENCOUNTER — Ambulatory Visit: Payer: BC Managed Care – PPO

## 2017-06-24 ENCOUNTER — Ambulatory Visit: Payer: BC Managed Care – PPO

## 2017-06-26 ENCOUNTER — Ambulatory Visit
Admission: RE | Admit: 2017-06-26 | Discharge: 2017-06-26 | Disposition: A | Discharge status: Home / Self Care | Payer: BC Managed Care – PPO | Source: Ambulatory Visit | Attending: Nurse Practitioner | Admitting: Nurse Practitioner

## 2017-06-26 DIAGNOSIS — K760 Fatty (change of) liver, not elsewhere classified: Secondary | ICD-10-CM | POA: Diagnosis not present

## 2017-06-26 DIAGNOSIS — R1030 Lower abdominal pain, unspecified: Secondary | ICD-10-CM | POA: Insufficient documentation

## 2017-06-26 DIAGNOSIS — Z9884 Bariatric surgery status: Secondary | ICD-10-CM | POA: Diagnosis not present

## 2017-06-26 IMAGING — CT CT ABD-PELV W/ CM
1 of 3 series · 14 of 32 positions shown, 19 images · IV contrast (APPLIED)
Comparison: CT abdomen and pelvis [DATE].

CLINICAL DATA: Worsening left lower quadrant pain over the past 9
months with nausea and constipation. History of gastric bypass in
[VI].

EXAM:
CT ABDOMEN AND PELVIS WITH CONTRAST
TECHNIQUE: Multidetector CT imaging of the abdomen and pelvis was performed
using the standard protocol following bolus administration of
intravenous contrast.
CONTRAST:  100 ml [VI] IOPAMIDOL ([VI]) INJECTION 61%

[Series 2: axial st · axial · 0.76mm/px · z∈[-988,-563]mm · 14 of 97 slices shown, 19 images]
[im 6/97  soft-tissue]
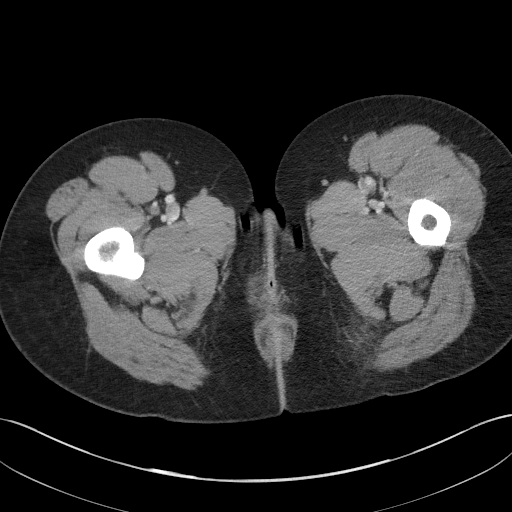
[im 6/97  bone]
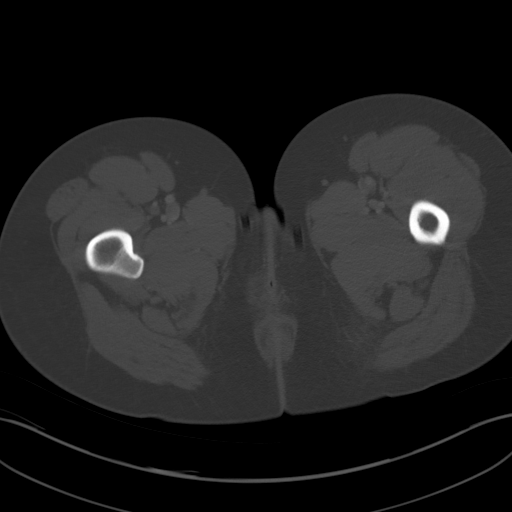
[im 11/97  soft-tissue]
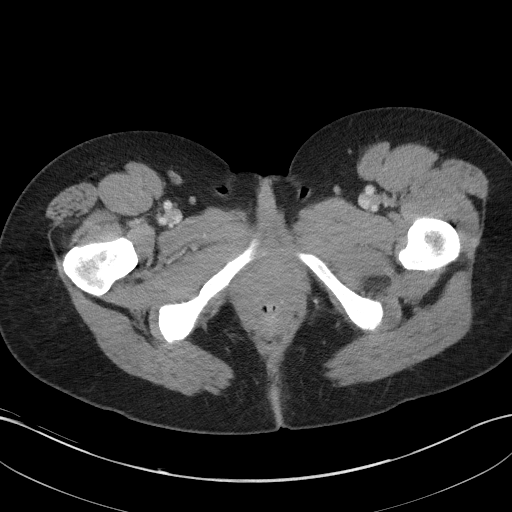
[im 22/97  soft-tissue]
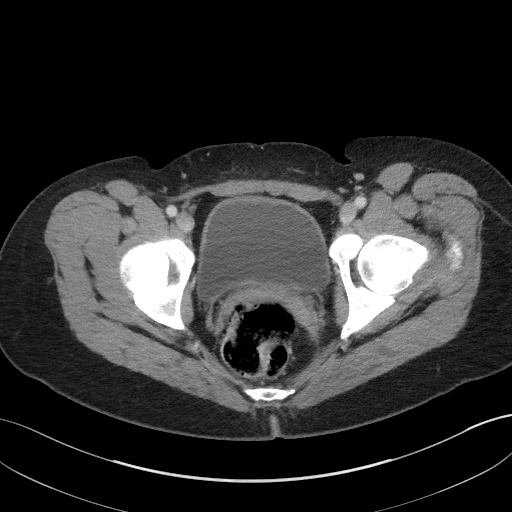
[im 27/97  soft-tissue]
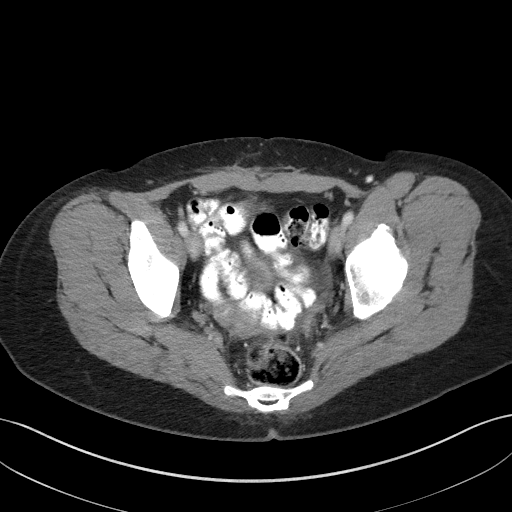
[im 33/97  soft-tissue]
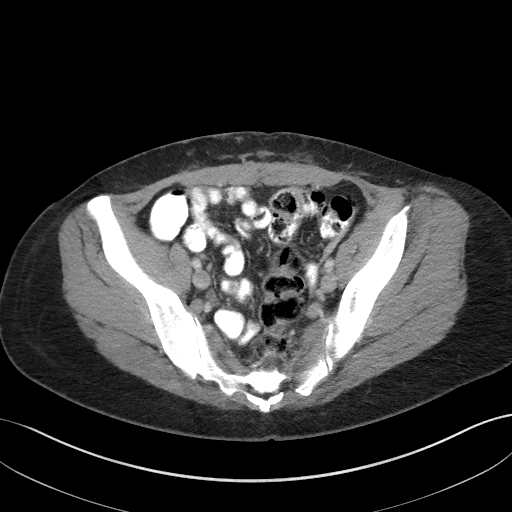
[im 43/97  soft-tissue]
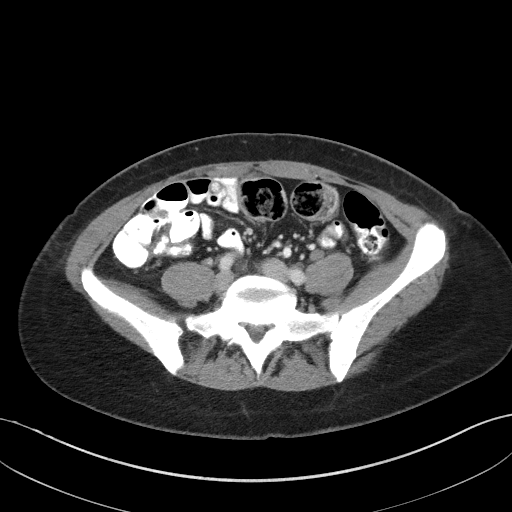
[im 49/97  soft-tissue]
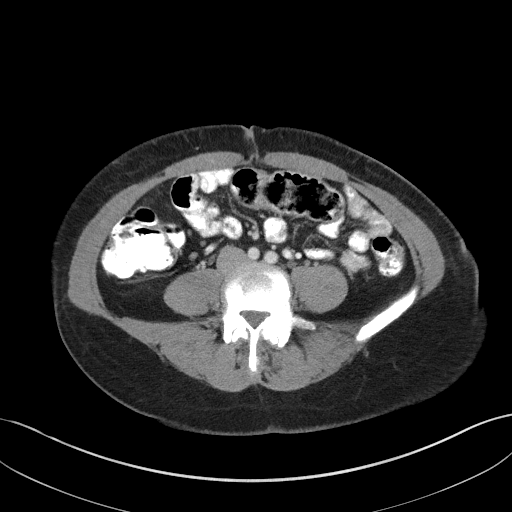
[im 54/97  soft-tissue]
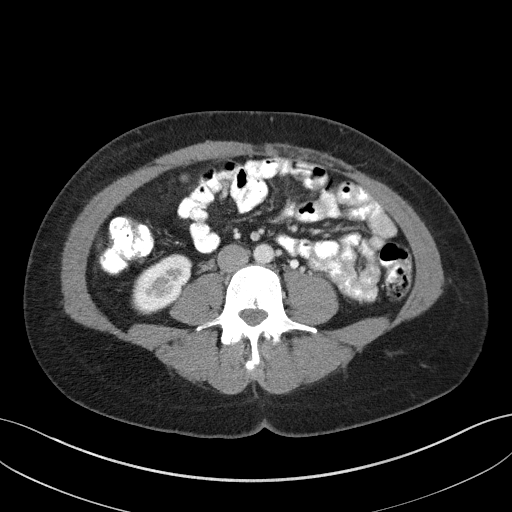
[im 65/97  soft-tissue]
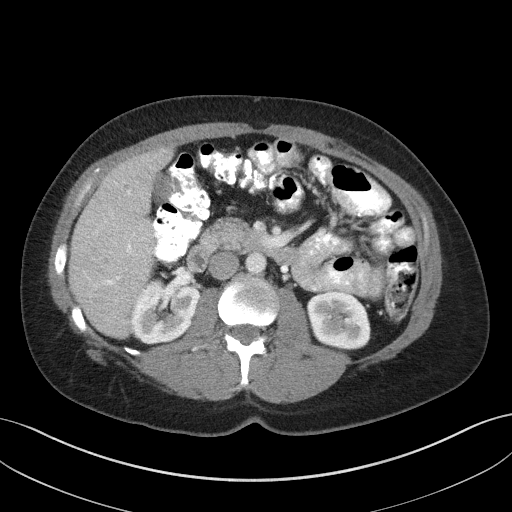
[im 65/97  bone]
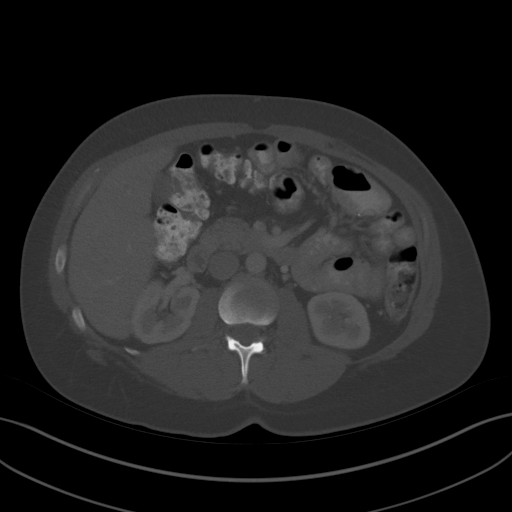
[im 70/97  soft-tissue]
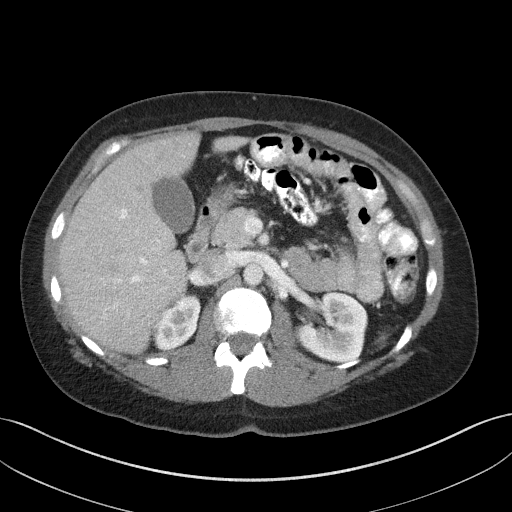
[im 75/97  soft-tissue]
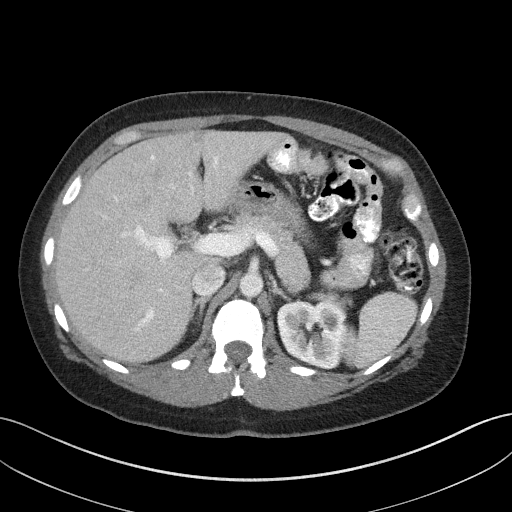
[im 75/97  lung]
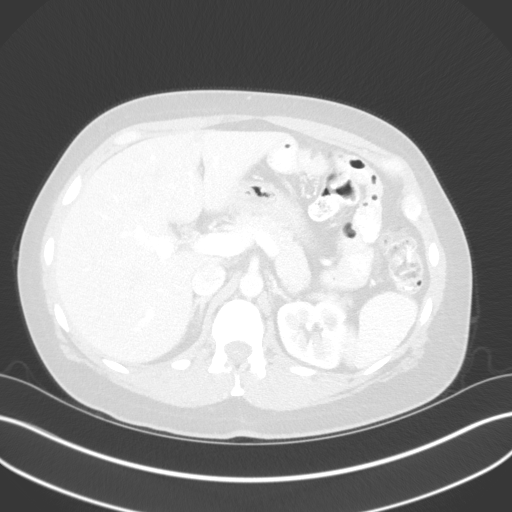
[im 81/97  lung]
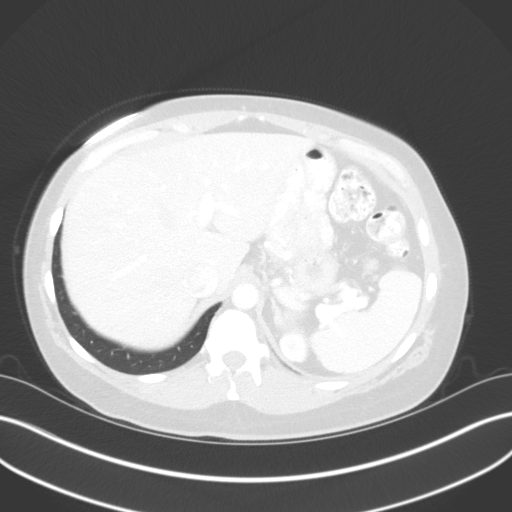
[im 86/97  soft-tissue]
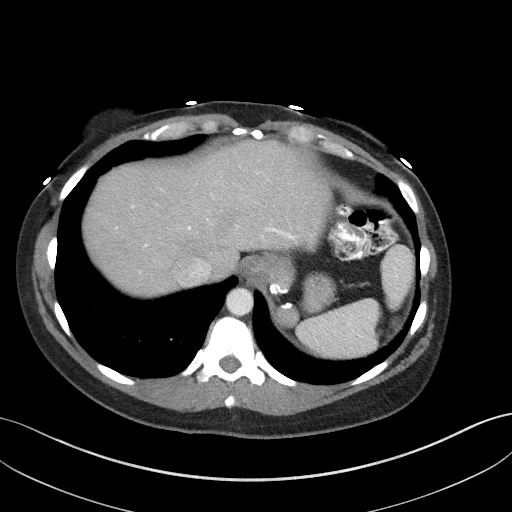
[im 86/97  lung]
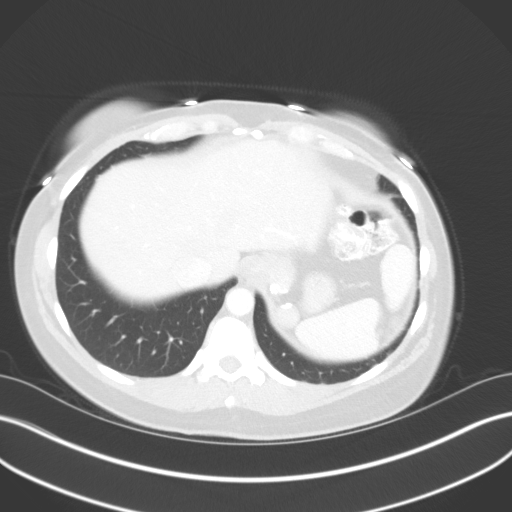
[im 91/97  soft-tissue]
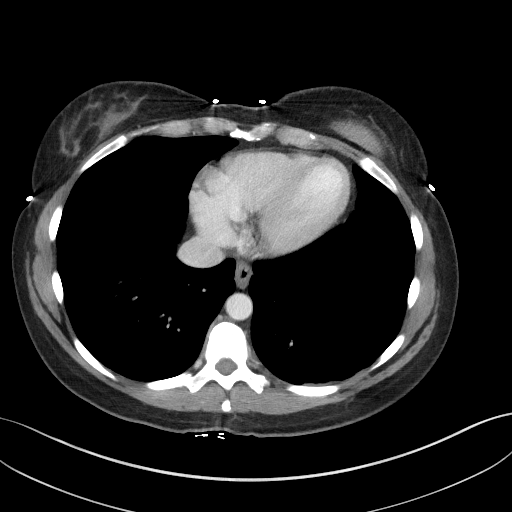
[im 91/97  lung]
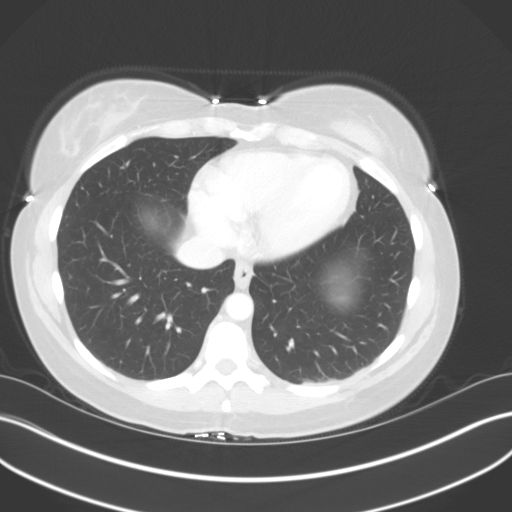

[14 of 32 positions shown; findings below may reference images not displayed]

FINDINGS: Lower chest: Minimal dependent atelectasis is present in the lung
bases. Heart size is normal. No pleural or pericardial effusion.

Hepatobiliary: No focal liver lesion. The liver is low attenuating
consistent with fatty infiltration. The gallbladder and biliary tree
appear normal.

Pancreas: Unremarkable. No pancreatic ductal dilatation or
surrounding inflammatory changes.

Spleen: Normal in size without focal abnormality.

Adrenals/Urinary Tract: The adrenal glands appear normal. Small
hypoattenuating lesions in the left kidney are likely cysts and
unchanged. The right kidney, ureters and urinary bladder appear
normal.

Stomach/Bowel: Status post gastric bypass. Stomach and small bowel
otherwise appear normal. The colon and appendix are normal in
appearance.

Vascular/Lymphatic: No significant vascular findings are present. No
enlarged abdominal or pelvic lymph nodes.

Reproductive: Status post hysterectomy. No adnexal masses.

Other: No ascites or hernia. Infiltration of subcutaneous fat in the
anterior pelvis may be due to contusion, injection of medicine or
inflammatory change. This is new since the prior exam.

Musculoskeletal: No fracture worrisome lesion.
IMPRESSION: Infiltration of subcutaneous fat in the anterior pelvis is
nonspecific but could be due to contusion, injection of medicine or
less likely inflammatory change.

No acute abnormality within the abdomen or pelvis. No finding to
explain patient's symptoms.

Fatty infiltration of the liver.

Status post gastric bypass.

## 2017-06-26 MED ORDER — IOPAMIDOL (ISOVUE-300) INJECTION 61%
100.0000 mL | Freq: Once | INTRAVENOUS | Status: AC | PRN
  Administered 2017-06-26: 100 mL via INTRAVENOUS

## 2017-06-30 ENCOUNTER — Other Ambulatory Visit: Payer: Self-pay | Admitting: Nurse Practitioner

## 2017-06-30 DIAGNOSIS — R1030 Lower abdominal pain, unspecified: Secondary | ICD-10-CM

## 2017-07-01 ENCOUNTER — Ambulatory Visit (INDEPENDENT_AMBULATORY_CARE_PROVIDER_SITE_OTHER): Payer: BC Managed Care – PPO | Admitting: Nurse Practitioner

## 2017-07-01 VITALS — BP 167/94 | HR 88 | Temp 98.1°F | Ht 64.0 in | Wt 177.0 lb

## 2017-07-01 DIAGNOSIS — M792 Neuralgia and neuritis, unspecified: Secondary | ICD-10-CM

## 2017-07-01 DIAGNOSIS — E2839 Other primary ovarian failure: Secondary | ICD-10-CM | POA: Diagnosis not present

## 2017-07-01 MED ORDER — ESTRADIOL 0.5 MG PO TABS: 0.5000 mg | ORAL_TABLET | Freq: Every day | ORAL | 5 refills | Status: DC

## 2017-07-01 MED ORDER — GABAPENTIN 300 MG PO CAPS: ORAL_CAPSULE | ORAL | 5 refills | Status: DC

## 2017-07-01 NOTE — Assessment & Plan Note (Addendum)
Pt states is currently stable on low dose of estrogen 0.5 mg once daily.  Is going to need refill prior to next 6 month follow up w/ GYN.  Plan: 1. GYN needs to continue directing taper down on medication.  Will send refill today for maintaining stable dose given stable clinical presentation and medical history of suicide attempt.

## 2017-07-01 NOTE — Patient Instructions (Addendum)
Theresa [name], Thank you for coming in to clinic today.  1. Your pain is most likely nerve-related pain or Neuropathic pain. - Sometimes this does get better, but in many cases it is a long-term problem. - INCREASE gabapentin to 1 capsule in am (300 mg), 1 capsule as needed in daytime for pain, and 2 capsules (600 mg) at bedtime.  Please schedule a follow-up appointment with Cassell Smiles, AGNP. Return in about 10 months (around 05/15/2018) for annual physical AND as needed.  If you have any other questions or concerns, please feel free to call the clinic or send a message through [place]. You may also schedule an earlier appointment if necessary.  You will receive a survey after today's visit either digitally by e-mail or paper by [employer]. Your experiences and feedback matter to [place].  Please respond so we know how we are doing as we provide care for you.   Cassell Smiles, DNP, AGNP-BC Adult Gerontology Nurse Practitioner [place]

## 2017-07-01 NOTE — Progress Notes (Signed)
Subjective:    Patient ID: Theresa [name], female    DOB: [birth year], 46 y.o.   MRN: [id number]  Theresa [name] is a 46 y.o. female presenting on 07/01/2017 for diagnostic study (Ct scan abdomen / pelvis results) and Abdominal Pain   HPI Follow up Abdominal Pain Has had negative CT scan for any pain explanation.  Results reviewed with patient in clinic. Left lower quadrant abdominal pain/pelvic pain is sharp, intense pain.  Had been off gabapentin x 4 days and pain was much more severe on those days.  Pain was so severe, she had to stay home from work for 2 days while gabapentin was resumed.  Psychiatric Review Pt reports overall has great moods recently.  Pt states psychiatrist agrees w/ addition of Wellbutrin and is going to assume responsibility for prescribing this and her Belsomra.  - Coworkers have begun commenting that she seems to be "back to my normal bubbly personality." - Boyfriend is pleased w/ current moods - She is pleased w/ her current moods. Denies SI/HI and has no plans to carry out if SI/HI arise.  Depression screen [facility] 2/9 07/01/2017 05/14/2017 04/11/2017 02/12/2016  Decreased Interest 0 1 2 0  Down, Depressed, Hopeless 0 1 3 0  PHQ - 2 Score 0 2 5 0  Altered sleeping 1 3 3  -  Tired, decreased energy 1 2 3  -  Change in appetite 1 2 3  -  Feeling bad or failure about yourself  - 0 3 -  Trouble concentrating 0 1 3 -  Moving slowly or fidgety/restless 0 0 3 -  Suicidal thoughts 0 0 1 -  PHQ-9 Score 3 10 24  -  Difficult doing work/chores Not difficult at all Somewhat difficult - -    Social History  Substance Use Topics  . Smoking status: Never Smoker  . Smokeless tobacco: Never Used  . Alcohol use Yes     Comment: rare-occ. monthly    Review of Systems Per HPI unless specifically indicated above     Objective:    BP (!) 167/94 (BP Location: Left Arm, Patient Position: Sitting, Cuff Size: Normal)   Pulse 88   Temp 98.1 F (36.7 C) (Oral)   Ht 5\' 4"   (1.626 m)   Wt 177 lb (80.3 kg)   LMP 10/24/2014 (Approximate)   BMI 30.38 kg/m   Wt Readings from Last 3 Encounters:  07/01/17 177 lb (80.3 kg)  06/03/17 177 lb 6.4 oz (80.5 kg)  05/14/17 172 lb 6.4 oz (78.2 kg)    Physical Exam  General - overweight, well-appearing, NAD HEENT - Normocephalic, atraumatic Heart - RRR, no murmurs heard Skin - warm, dry Neuro - awake, alert, oriented x3, normal gait Psych - Normal mood and affect, normal behavior    Results for orders placed or performed in visit on 06/03/17  CBC with Differential/Platelet  Result Value Ref Range   WBC 6.5 3.8 - 10.8 Thousand/uL   RBC 4.59 3.80 - 5.10 Million/uL   Hemoglobin 13.8 11.7 - 15.5 g/dL   HCT 41.9 35.0 - 45.0 %   MCV 91.3 80.0 - 100.0 fL   MCH 30.1 27.0 - 33.0 pg   MCHC 32.9 32.0 - 36.0 g/dL   RDW 11.7 11.0 - 15.0 %   Platelets 209 140 - 400 Thousand/uL   MPV 9.6 7.5 - 12.5 fL   Neutro Abs 4,336 1,500 - 7,800 cells/uL   Lymphs Abs 1,762 850 - 3,900 cells/uL   WBC mixed population 312  200 - 950 cells/uL   Eosinophils Absolute 72 15 - 500 cells/uL   Basophils Absolute 20 0 - 200 cells/uL   Neutrophils Relative % 66.7 %   Total Lymphocyte 27.1 %   Monocytes Relative 4.8 %   Eosinophils Relative 1.1 %   Basophils Relative 0.3 %   Personal review of Abdominal CT scan images and report from 06/26/17:  1. No identified cause of abdominal pain was found. 2. Fatty liver infiltration. We will continue with regular physical exams and labs to evaluate this. 3. A non-specific change of fat the subcutaneous pelvic tissue. This has many causes and is possibly, but unlikely, the cause of pt's pain.  Can also be inflammation r/t scarring process of skin s/p pelvic laparotomy.     Assessment & Plan:   Problem List Items Addressed This Visit      Other   Neuropathic pain - Primary    Chronic pain of left lower abdomen and pelvis.  Pt has had several pelvic surgeries.  Possiblity for nerve  damage/adhesions as cause for pain without any identifiable cause on CT scan.  Most likely, the subcutaneous tissue changes are inflammation r/t scarring process.  Less likely psychogenic pain r/t improved psychological symptoms at this time on multiple medications.  Pt on gabapentin w/ worst symptoms when not taking gabapentin.  Plan: 1. Reviewed CT scan results with patient in detail. 2. INCREASE gabapentin to 300 mg am and afternoon.  If not drowsy, can take in am together.  INCREASE gabapentin to 600 mg at bedtime. 3. Follow up as needed.and at least once annually.      Relevant Medications   gabapentin (NEURONTIN) 300 MG capsule   Estrogen deficiency    Pt states is currently stable on low dose of estrogen 0.5 mg once daily.  Is going to need refill prior to next 6 month follow up w/ GYN.  Plan: 1. GYN needs to continue directing taper down on medication.  Will send refill today for maintaining stable dose given stable clinical presentation and medical history of suicide attempt.      Relevant Medications   estradiol (ESTRACE) 0.5 MG tablet      Meds ordered this encounter  Medications  . methylphenidate (RITALIN LA) 20 MG 24 hr capsule    Refill:  0  . methylphenidate (RITALIN) 20 MG tablet    Refill:  0  . DISCONTD: gabapentin (NEURONTIN) 300 MG capsule    Refill:  0  . DISCONTD: estradiol (ESTRACE) 0.5 MG tablet    Sig: Take 1 tablet (0.5 mg total) by mouth daily.    Dispense:  30 tablet    Refill:  5    Order Specific Question:   Supervising Provider    Answer:   Olin Hauser [[id number]]  . gabapentin (NEURONTIN) 300 MG capsule    Sig: Take 1 capsule in am and 1 additional capsule during day as needed for pain.  Take 2 capsules at bedtime.    Dispense:  120 capsule    Refill:  5    Order Specific Question:   Supervising Provider    Answer:   Olin Hauser [[id number]]  . estradiol (ESTRACE) 0.5 MG tablet    Sig: Take 1 tablet (0.5 mg total) by mouth  daily.    Dispense:  30 tablet    Refill:  5    Order Specific Question:   Supervising Provider    Answer:   Olin Hauser [[id number]]  Follow up plan: Return in about 10 months (around 05/15/2018) for annual physical AND as needed.  Cassell Smiles, DNP, AGPCNP-BC Adult Gerontology Primary Care Nurse Practitioner [facility] 07/03/2017, 9:46 AM

## 2017-07-03 ENCOUNTER — Ambulatory Visit: Payer: BC Managed Care – PPO

## 2017-07-03 NOTE — Assessment & Plan Note (Addendum)
Chronic pain of left lower abdomen and pelvis.  Pt has had several pelvic surgeries.  Possiblity for nerve damage/adhesions as cause for pain without any identifiable cause on CT scan.  Most likely, the subcutaneous tissue changes are inflammation r/t scarring process.  Less likely psychogenic pain r/t improved psychological symptoms at this time on multiple medications.  Pt on gabapentin w/ worst symptoms when not taking gabapentin.  Plan: 1. Reviewed CT scan results with patient in detail. 2. INCREASE gabapentin to 300 mg am and afternoon.  If not drowsy, can take in am together.  INCREASE gabapentin to 600 mg at bedtime. 3. Follow up as needed.and at least once annually.

## 2017-07-07 ENCOUNTER — Encounter (HOSPITAL_COMMUNITY): Payer: Self-pay

## 2017-07-18 ENCOUNTER — Encounter: Payer: Self-pay | Admitting: Nurse Practitioner

## 2017-07-18 ENCOUNTER — Telehealth: Payer: Self-pay

## 2017-07-18 NOTE — Telephone Encounter (Signed)
Mail out letter

## 2017-08-30 ENCOUNTER — Other Ambulatory Visit: Payer: Self-pay | Admitting: Nurse Practitioner

## 2017-08-30 DIAGNOSIS — F314 Bipolar disorder, current episode depressed, severe, without psychotic features: Secondary | ICD-10-CM

## 2017-11-25 ENCOUNTER — Telehealth: Payer: BC Managed Care – PPO | Admitting: Family

## 2017-11-25 DIAGNOSIS — R300 Dysuria: Secondary | ICD-10-CM

## 2017-11-25 MED ORDER — NITROFURANTOIN MONOHYD MACRO 100 MG PO CAPS: 100.0000 mg | ORAL_CAPSULE | Freq: Two times a day (BID) | ORAL | 0 refills | Status: DC

## 2017-11-25 NOTE — Progress Notes (Signed)

## 2017-11-27 ENCOUNTER — Other Ambulatory Visit: Payer: Self-pay

## 2017-11-27 DIAGNOSIS — F314 Bipolar disorder, current episode depressed, severe, without psychotic features: Secondary | ICD-10-CM

## 2017-11-28 MED ORDER — BUPROPION HCL ER (SR) 100 MG PO TB12: 100.0000 mg | ORAL_TABLET | Freq: Two times a day (BID) | ORAL | 2 refills | Status: DC

## 2017-12-02 ENCOUNTER — Other Ambulatory Visit: Payer: Self-pay

## 2017-12-02 DIAGNOSIS — F314 Bipolar disorder, current episode depressed, severe, without psychotic features: Secondary | ICD-10-CM

## 2017-12-02 DIAGNOSIS — M792 Neuralgia and neuritis, unspecified: Secondary | ICD-10-CM

## 2017-12-02 MED ORDER — GABAPENTIN 300 MG PO CAPS: ORAL_CAPSULE | ORAL | 0 refills | Status: DC

## 2017-12-02 MED ORDER — BUPROPION HCL ER (SR) 100 MG PO TB12: 100.0000 mg | ORAL_TABLET | Freq: Two times a day (BID) | ORAL | 0 refills | Status: DC

## 2018-01-09 ENCOUNTER — Other Ambulatory Visit: Payer: Self-pay | Admitting: Nurse Practitioner

## 2018-01-09 DIAGNOSIS — R1030 Lower abdominal pain, unspecified: Secondary | ICD-10-CM

## 2018-02-10 ENCOUNTER — Other Ambulatory Visit: Payer: Self-pay | Admitting: Family Medicine

## 2018-02-10 DIAGNOSIS — R1030 Lower abdominal pain, unspecified: Secondary | ICD-10-CM

## 2018-03-23 ENCOUNTER — Other Ambulatory Visit: Payer: Self-pay | Admitting: Family Medicine

## 2018-03-23 DIAGNOSIS — M792 Neuralgia and neuritis, unspecified: Secondary | ICD-10-CM

## 2018-03-23 DIAGNOSIS — F314 Bipolar disorder, current episode depressed, severe, without psychotic features: Secondary | ICD-10-CM

## 2018-03-24 ENCOUNTER — Encounter: Payer: Self-pay | Admitting: Nurse Practitioner

## 2018-03-24 MED ORDER — GABAPENTIN 300 MG PO CAPS: ORAL_CAPSULE | ORAL | 0 refills | Status: DC

## 2018-03-24 NOTE — Addendum Note (Signed)
Addended by: Cleaster Corin on: 03/24/2018 08:43 AM   Modules accepted: Orders

## 2018-04-08 NOTE — Telephone Encounter (Signed)
-----   Message from Mikey College, NP sent at 04/07/2018  8:03 AM EDT ----- Regarding: Please call pt Please call patient.  She got her med refills, but needs an appointment.  This message is to inform you that the patient has not yet read the following message. (Notification date: April 07, 2018) Office visit needed   From Mikey College, NP To [place] 03/24/2018 8:43 AM [name],   I have refilled your medications as requested from your pharmacy. Please schedule a visit with me in the next 6-8 weeks or sooner so we can review your medications and continue them for the next year. You will need a visit before any additional refills.   Let me know if you have any questions.   Cassell Smiles, DNP, AGPCNP-BC  Adult Gerontology Primary Care Nurse Practitioner  [facility]   [address]   MyChart User Last Read On Theresa [name] Not Read

## 2018-04-08 NOTE — Telephone Encounter (Signed)
Attempted to contact the pt, no answer. LMOM to return my call.

## 2018-04-10 ENCOUNTER — Encounter: Payer: Self-pay | Admitting: Nurse Practitioner

## 2018-04-10 ENCOUNTER — Other Ambulatory Visit: Payer: Self-pay

## 2018-04-10 ENCOUNTER — Ambulatory Visit: Payer: BC Managed Care – PPO | Admitting: Nurse Practitioner

## 2018-04-10 VITALS — BP 119/81 | HR 76 | Temp 98.1°F | Ht 64.0 in | Wt 174.4 lb

## 2018-04-10 DIAGNOSIS — M792 Neuralgia and neuritis, unspecified: Secondary | ICD-10-CM

## 2018-04-10 DIAGNOSIS — R1030 Lower abdominal pain, unspecified: Secondary | ICD-10-CM | POA: Diagnosis not present

## 2018-04-10 DIAGNOSIS — R635 Abnormal weight gain: Secondary | ICD-10-CM | POA: Diagnosis not present

## 2018-04-10 DIAGNOSIS — K912 Postsurgical malabsorption, not elsewhere classified: Secondary | ICD-10-CM | POA: Insufficient documentation

## 2018-04-10 DIAGNOSIS — Z79899 Other long term (current) drug therapy: Secondary | ICD-10-CM

## 2018-04-10 DIAGNOSIS — Z9884 Bariatric surgery status: Secondary | ICD-10-CM | POA: Diagnosis not present

## 2018-04-10 DIAGNOSIS — Z1231 Encounter for screening mammogram for malignant neoplasm of breast: Secondary | ICD-10-CM

## 2018-04-10 DIAGNOSIS — Z1239 Encounter for other screening for malignant neoplasm of breast: Secondary | ICD-10-CM

## 2018-04-10 MED ORDER — MELOXICAM 7.5 MG PO TABS: 7.5000 mg | ORAL_TABLET | Freq: Every day | ORAL | 5 refills | Status: DC | PRN

## 2018-04-10 MED ORDER — GABAPENTIN 300 MG PO CAPS: ORAL_CAPSULE | ORAL | 1 refills | Status: DC

## 2018-04-10 NOTE — Patient Instructions (Addendum)
Candis Shine,   Thank you for coming in to clinic today.  1. Gabapentin - continue 600 mg breakfast and dinner, and start 300 mg in afternoon on higher pain days if needed.  2. Continue psychiatry.  3. Start a food log.  Look at overall fat content and total calorie intake after you have logged for a week.  If calories are higher than 1600 cal, reduce snack calories (with reducing fat).   For weight loss you may need to restrict to 1200-1400 calories.  Please schedule a follow-up appointment with Cassell Smiles, AGNP. Return in about 6 months (around 10/11/2018) for pelvic pain and weight.  If you have any other questions or concerns, please feel free to call the clinic or send a message through [place]. You may also schedule an earlier appointment if necessary.  You will receive a survey after today's visit either digitally by e-mail or paper by [employer]. Your experiences and feedback matter to [place].  Please respond so we know how we are doing as we provide care for you.   Cassell Smiles, DNP, AGNP-BC Adult Gerontology Nurse Practitioner [place]

## 2018-04-10 NOTE — Progress Notes (Signed)
Subjective:    Patient ID: Theresa [name], female    DOB: May 02, 1971, 47 y.o.   MRN: [id number]  Theresa [name] is a 47 y.o. female presenting on 04/10/2018 for Anxiety   HPI Bipolar disorder/ Weight Gain Is at [facility].  Off Abilify and belsomra and put on Risperdal.  Increased Wellbutrin by 150 mg SR bid.   Was last seen 1 week ago.  Is seeing improved moods, better sleep with Risperdal. Patient concern about these meds at this time is weight gain.  She is now stable.  Had reached 180 lbs and is already losing weight.  Eats 6 times daily (2-3 small meals and 3-4 snacks).  Focusing on high protein.  Has hypoglycemia if not eating.  No hypoglycemic events since starting Risperdal.  Has not done any labs with psychiatry.  Pelvic Pain Is still on gabapentin - prescribed by me continuing for pain. Physicians for [facility] - last visit was near last visit.    Increased pain with weather, activity levels.  Sometimes taking daily.  Occasionally 1-2 weeks without.  Gabapentin controls pain sometimes, other days none.  Heat, sleeping help with pain tolerance. Pain is low abdominal ache that is tolerable, occasionally sharp and unbearable.  Always in same left lower pelvic pain.  No consistently known increased pain trigger.  2-3 days per week has very high pain that interacts and prevents focus with work.  She did not get consistent relief with 300 in am 300 in afternoon and 600 at bedtime, so changed to 600 bid.  Gabapentin 600 mg does help in early am, but does not continue through the day on bad pain days.    Social History   Tobacco Use  . Smoking status: Never Smoker  . Smokeless tobacco: Never Used  Substance Use Topics  . Alcohol use: Yes    Comment: rare-occ. monthly  . Drug use: No    Review of Systems Per HPI unless specifically indicated above     Objective:    BP 119/81 (BP Location: Right Arm, Patient Position: Sitting, Cuff Size: Normal)   Pulse  76   Temp 98.1 F (36.7 C) (Oral)   Ht 5\' 4"  (1.626 m)   Wt 174 lb 6.4 oz (79.1 kg)   LMP 10/24/2014 (Approximate)   BMI 29.94 kg/m   Wt Readings from Last 3 Encounters:  04/10/18 174 lb 6.4 oz (79.1 kg)  07/01/17 177 lb (80.3 kg)  06/03/17 177 lb 6.4 oz (80.5 kg)    Physical Exam  Constitutional: She is oriented to person, place, and time. She appears well-developed and well-nourished. No distress.  HENT:  Head: Normocephalic and atraumatic.  Right Ear: External ear normal.  Left Ear: External ear normal.  Nose: Nose normal.  Mouth/Throat: Oropharynx is clear and moist.  Eyes: Pupils are equal, round, and reactive to light. Conjunctivae are normal.  Neck: Normal range of motion. Neck supple. No JVD present. No tracheal deviation present. No thyromegaly present.  Cardiovascular: Normal rate, regular rhythm, normal heart sounds and intact distal pulses. Exam reveals no gallop and no friction rub.  No murmur heard. Pulmonary/Chest: Effort normal and breath sounds normal. No respiratory distress.  Abdominal: Soft. Bowel sounds are normal. She exhibits no distension. There is no hepatosplenomegaly. There is no tenderness. There is no guarding.  Musculoskeletal: Normal range of motion.  Lymphadenopathy:    She has no cervical adenopathy.  Neurological: She is alert and oriented to person, place, and time. No  cranial nerve deficit.  Skin: Skin is warm and dry. Capillary refill takes less than 2 seconds.  Psychiatric: She has a normal mood and affect. Her behavior is normal. Judgment and thought content normal.  Nursing note and vitals reviewed.  Results for orders placed or performed in visit on 04/10/18  Hemoglobin A1c  Result Value Ref Range   Hgb A1c MFr Bld 5.0 <5.7 % of total Hgb   Mean Plasma Glucose 97 (calc)   eAG (mmol/L) 5.4 (calc)  COMPLETE METABOLIC PANEL WITH GFR  Result Value Ref Range   Glucose, Bld 92 65 - 139 mg/dL   BUN 12 7 - 25 mg/dL   Creat 0.92 0.50 - 1.10  mg/dL   GFR, Est Non African American 76 > OR = 60 mL/min/1.7m2   GFR, Est African American 88 > OR = 60 mL/min/1.48m2   BUN/Creatinine Ratio NOT APPLICABLE 6 - 22 (calc)   Sodium 139 135 - 146 mmol/L   Potassium 4.2 3.5 - 5.3 mmol/L   Chloride 104 98 - 110 mmol/L   CO2 28 20 - 32 mmol/L   Calcium 9.6 8.6 - 10.2 mg/dL   Total Protein 6.5 6.1 - 8.1 g/dL   Albumin 4.5 3.6 - 5.1 g/dL   Globulin 2.0 1.9 - 3.7 g/dL (calc)   AG Ratio 2.3 1.0 - 2.5 (calc)   Total Bilirubin 0.7 0.2 - 1.2 mg/dL   Alkaline phosphatase (APISO) 56 33 - 115 U/L   AST 13 10 - 30 U/L   ALT 19 6 - 29 U/L  CBC with Differential/Platelet  Result Value Ref Range   WBC 3.3 (L) 3.8 - 10.8 Thousand/uL   RBC 4.51 3.80 - 5.10 Million/uL   Hemoglobin 13.6 11.7 - 15.5 g/dL   HCT 40.2 35.0 - 45.0 %   MCV 89.1 80.0 - 100.0 fL   MCH 30.2 27.0 - 33.0 pg   MCHC 33.8 32.0 - 36.0 g/dL   RDW 12.4 11.0 - 15.0 %   Platelets 181 140 - 400 Thousand/uL   MPV 10.3 7.5 - 12.5 fL   Neutro Abs 1,990 1,500 - 7,800 cells/uL   Lymphs Abs 944 850 - 3,900 cells/uL   WBC mixed population 267 200 - 950 cells/uL   Eosinophils Absolute 89 15 - 500 cells/uL   Basophils Absolute 10 0 - 200 cells/uL   Neutrophils Relative % 60.3 %   Total Lymphocyte 28.6 %   Monocytes Relative 8.1 %   Eosinophils Relative 2.7 %   Basophils Relative 0.3 %  VITAMIN D 25 Hydroxy (Vit-D Deficiency, Fractures)  Result Value Ref Range   Vit D, 25-Hydroxy 48 30 - 100 ng/mL  B12  Result Value Ref Range   Vitamin B-12 297 200 - 1,100 pg/mL  Folate  Result Value Ref Range   Folate 8.4 ng/mL  Lipid panel  Result Value Ref Range   Cholesterol 175 <200 mg/dL   HDL 68 >50 mg/dL   Triglycerides 68 <150 mg/dL   LDL Cholesterol (Calc) 92 mg/dL (calc)   Total CHOL/HDL Ratio 2.6 <5.0 (calc)   Non-HDL Cholesterol (Calc) 107 <130 mg/dL (calc)      Assessment & Plan:   Problem List Items Addressed This Visit      Endocrine   Hypoglycemia after GI  (gastrointestinal) surgery     Other   Neuropathic pain - Primary   Lower abdominal pain   Relevant Medications   meloxicam (MOBIC) 7.5 MG tablet    Other Visit Diagnoses  History of gastric bypass       Relevant Orders   Hemoglobin A1c (Completed)   CBC with Differential/Platelet (Completed)   VITAMIN D 25 Hydroxy (Vit-D Deficiency, Fractures) (Completed)   B12 (Completed)   Folate (Completed)   Weight gain       Relevant Orders   Lipid panel   High risk medication use       Relevant Orders   Hemoglobin A1c (Completed)   COMPLETE METABOLIC PANEL WITH GFR (Completed)   Breast cancer screening       Relevant Orders   MM 3D SCREEN BREAST W/IMPLANT BILATERAL (Completed)      # Neuropathic and abdominal pain Chronic, stable.  Likely due to nerve injury after surgery and possible adhesions. - Continue gabapentin, start 300 mg additional dose in afternoon prn for higher pain days. - Continue meloxicam daily prn.  Avoid NSAIDs OTC while taking.  # Weight Gain, history of gastric bypass, hypoglycemia. Discussed possible causes, encouraged patient to do food log, reset macro nutrient ratios, keeping adequate protein and sufficient but not too much fat.  May need additional calorie restriction to 1200-1400 calories daily.  Can consider future referral back to bariatric surgery/bariatric medical management clinic. - hypoglycemia management with continuing regular food intake/snacks, but may need to adjust food content.  # Breast cancer screening Pt due for mammogram screening.  - Screening mammogram order placed.  Pt will call to schedule appointment.  Information given.   Meds ordered this encounter  Medications  . gabapentin (NEURONTIN) 300 MG capsule    Sig: Take 2 capsules in am and 1 additional capsule during day as needed for pain. Take 2 capsules at bedtime.    Dispense:  450 capsule    Refill:  1    Order Specific Question:   Supervising Provider    Answer:    Olin Hauser [[id number]]  . meloxicam (MOBIC) 7.5 MG tablet    Sig: Take 1 tablet (7.5 mg total) by mouth daily as needed (moderate back pain).    Dispense:  30 tablet    Refill:  5    Order Specific Question:   Supervising Provider    Answer:   Olin Hauser [[id number]]    Follow up plan: Return in about 6 months (around 10/11/2018) for pelvic pain and weight.  Cassell Smiles, DNP, AGPCNP-BC Adult Gerontology Primary Care Nurse Practitioner [facility] 04/10/2018, 9:01 AM

## 2018-04-11 LAB — COMPLETE METABOLIC PANEL WITH GFR
AG Ratio: 2.3 (calc) (ref 1.0–2.5)
ALT: 19 U/L (ref 6–29)
AST: 13 U/L (ref 10–30)
Albumin: 4.5 g/dL (ref 3.6–5.1)
Alkaline phosphatase (APISO): 56 U/L (ref 33–115)
BUN: 12 mg/dL (ref 7–25)
CO2: 28 mmol/L (ref 20–32)
Calcium: 9.6 mg/dL (ref 8.6–10.2)
Chloride: 104 mmol/L (ref 98–110)
Creat: 0.92 mg/dL (ref 0.50–1.10)
GFR, Est African American: 88 mL/min/{1.73_m2} (ref >=60)
GFR, Est Non African American: 76 mL/min/{1.73_m2} (ref >=60)
Globulin: 2 g/dL (calc) (ref 1.9–3.7)
Glucose, Bld: 92 mg/dL (ref 65–139)
Potassium: 4.2 mmol/L (ref 3.5–5.3)
Sodium: 139 mmol/L (ref 135–146)
Total Bilirubin: 0.7 mg/dL (ref 0.2–1.2)
Total Protein: 6.5 g/dL (ref 6.1–8.1)

## 2018-04-11 LAB — HEMOGLOBIN A1C
Hgb A1c MFr Bld: 5 % of total Hgb (ref <5.7)
Mean Plasma Glucose: 97 (calc)
eAG (mmol/L): 5.4 (calc)

## 2018-04-11 LAB — CBC WITH DIFFERENTIAL/PLATELET
Basophils Absolute: 10 cells/uL (ref 0–200)
Basophils Relative: 0.3 %
Eosinophils Absolute: 89 cells/uL (ref 15–500)
Eosinophils Relative: 2.7 %
HCT: 40.2 % (ref 35.0–45.0)
Hemoglobin: 13.6 g/dL (ref 11.7–15.5)
Lymphs Abs: 944 cells/uL (ref 850–3900)
MCH: 30.2 pg (ref 27.0–33.0)
MCHC: 33.8 g/dL (ref 32.0–36.0)
MCV: 89.1 fL (ref 80.0–100.0)
MPV: 10.3 fL (ref 7.5–12.5)
Monocytes Relative: 8.1 %
Neutro Abs: 1990 cells/uL (ref 1500–7800)
Neutrophils Relative %: 60.3 %
Platelets: 181 10*3/uL (ref 140–400)
RBC: 4.51 10*6/uL (ref 3.80–5.10)
RDW: 12.4 % (ref 11.0–15.0)
Total Lymphocyte: 28.6 %
WBC mixed population: 267 cells/uL (ref 200–950)
WBC: 3.3 10*3/uL — ABNORMAL LOW (ref 3.8–10.8)

## 2018-04-11 LAB — LIPID PANEL
Cholesterol: 175 mg/dL (ref <200)
HDL: 68 mg/dL (ref >50)
LDL Cholesterol (Calc): 92 mg/dL (calc)
Non-HDL Cholesterol (Calc): 107 mg/dL (calc) (ref <130)
Total CHOL/HDL Ratio: 2.6 (calc) (ref <5.0)
Triglycerides: 68 mg/dL (ref <150)

## 2018-04-11 LAB — VITAMIN D 25 HYDROXY (VIT D DEFICIENCY, FRACTURES): Vit D, 25-Hydroxy: 48 ng/mL (ref 30–100)

## 2018-04-11 LAB — FOLATE: Folate: 8.4 ng/mL

## 2018-04-11 LAB — VITAMIN B12: Vitamin B-12: 297 pg/mL (ref 200–1100)

## 2018-04-21 ENCOUNTER — Ambulatory Visit
Admission: RE | Admit: 2018-04-21 | Discharge: 2018-04-21 | Disposition: A | Discharge status: Home / Self Care | Payer: BC Managed Care – PPO | Source: Ambulatory Visit | Attending: Nurse Practitioner | Admitting: Nurse Practitioner

## 2018-04-21 DIAGNOSIS — Z1231 Encounter for screening mammogram for malignant neoplasm of breast: Secondary | ICD-10-CM | POA: Diagnosis present

## 2018-04-21 DIAGNOSIS — Z1239 Encounter for other screening for malignant neoplasm of breast: Secondary | ICD-10-CM

## 2018-04-21 IMAGING — MG MM  DIGITAL SCREENING BREAST BILAT IMPLANT W/ TOMO W/ CAD
8 of 16 series · 8 of 32 positions shown · non-contrast
Comparison: Previous exam(s).

CLINICAL DATA: Screening.

EXAM:
DIGITAL SCREENING BILATERAL MAMMOGRAM WITH IMPLANTS, CAD AND TOMO
The patient has retroglandular implants. Standard and implant
displaced views were performed.

[R MLO]
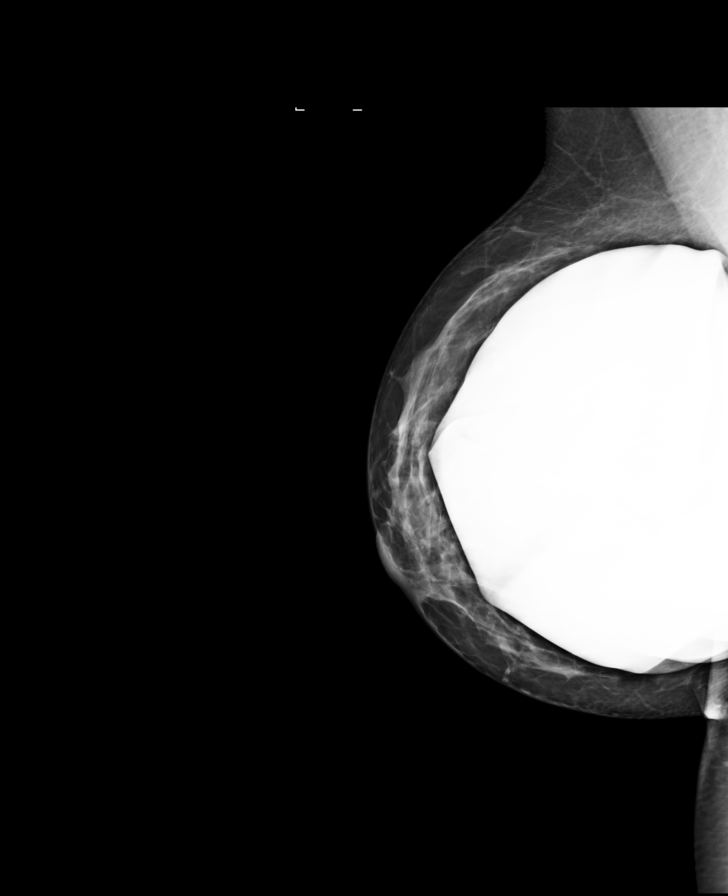

[L MLO (1 of 2)]
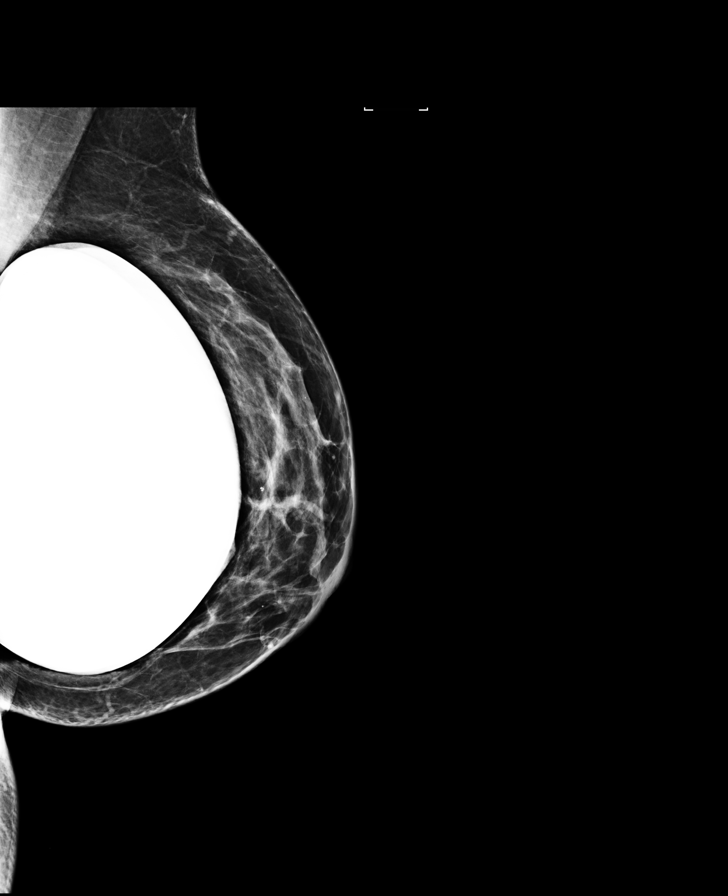

[L CC (1 of 2)]
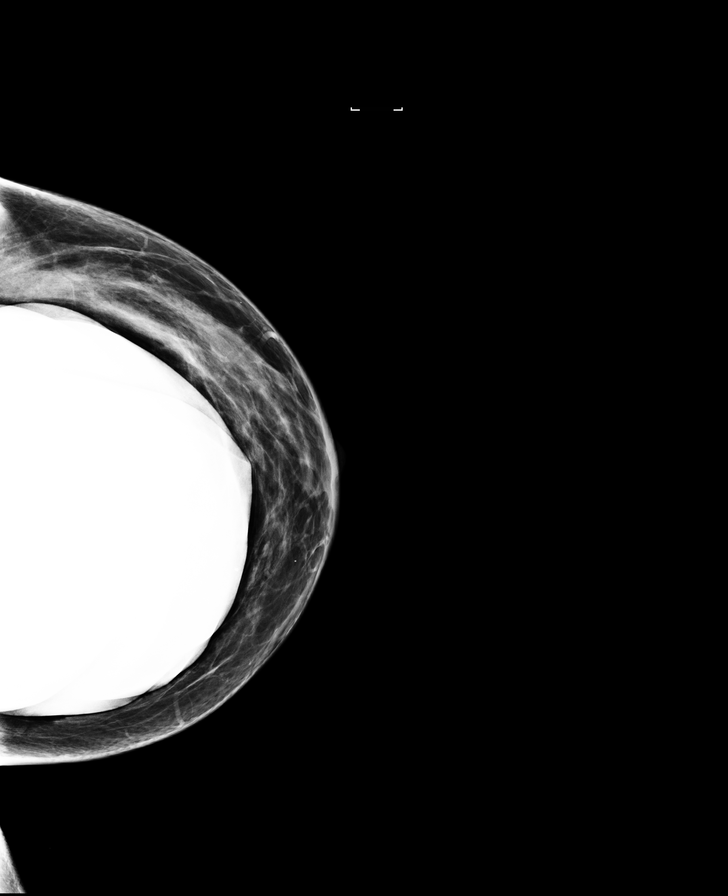

[R CC]
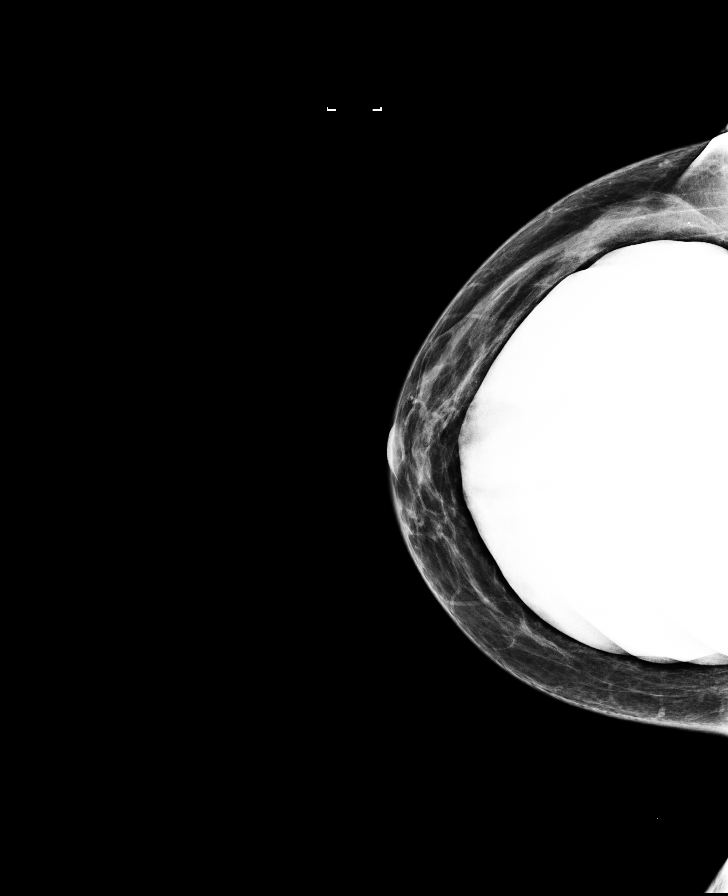

[L MLO (2 of 2)]
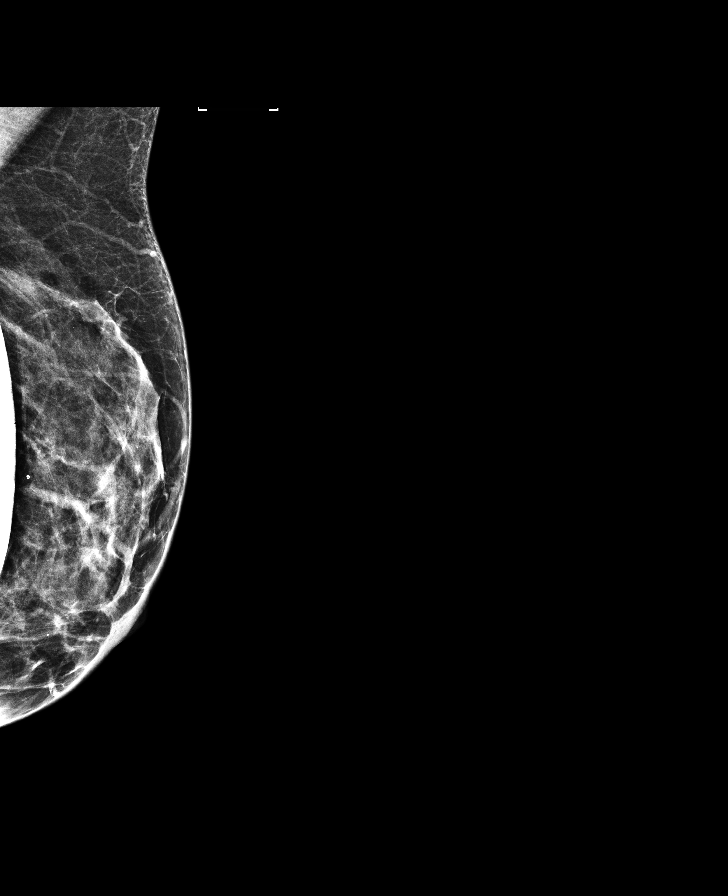

[L CC (2 of 2)]
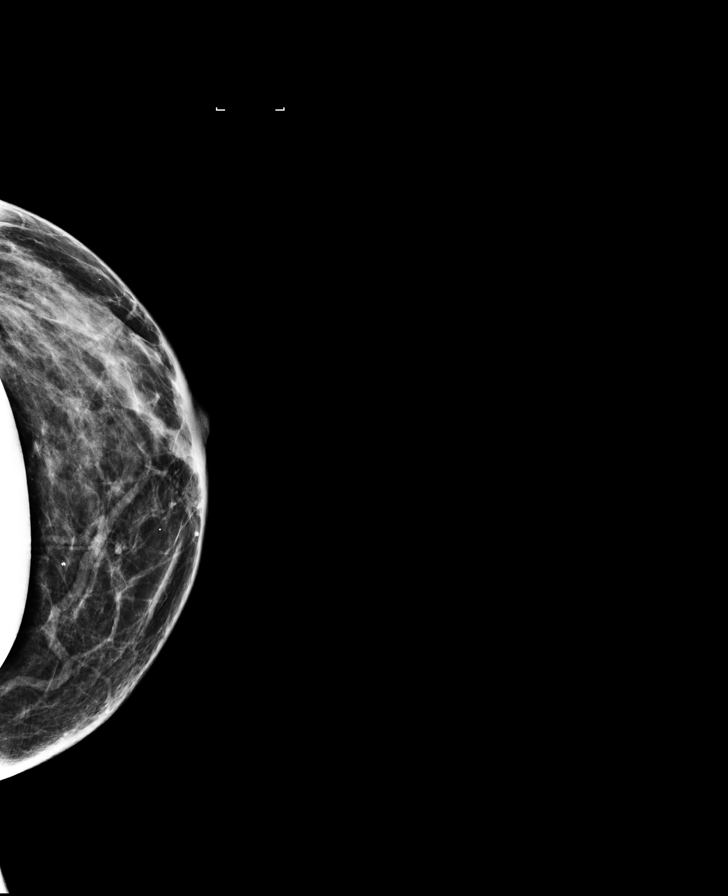

[R CC synth-2D]
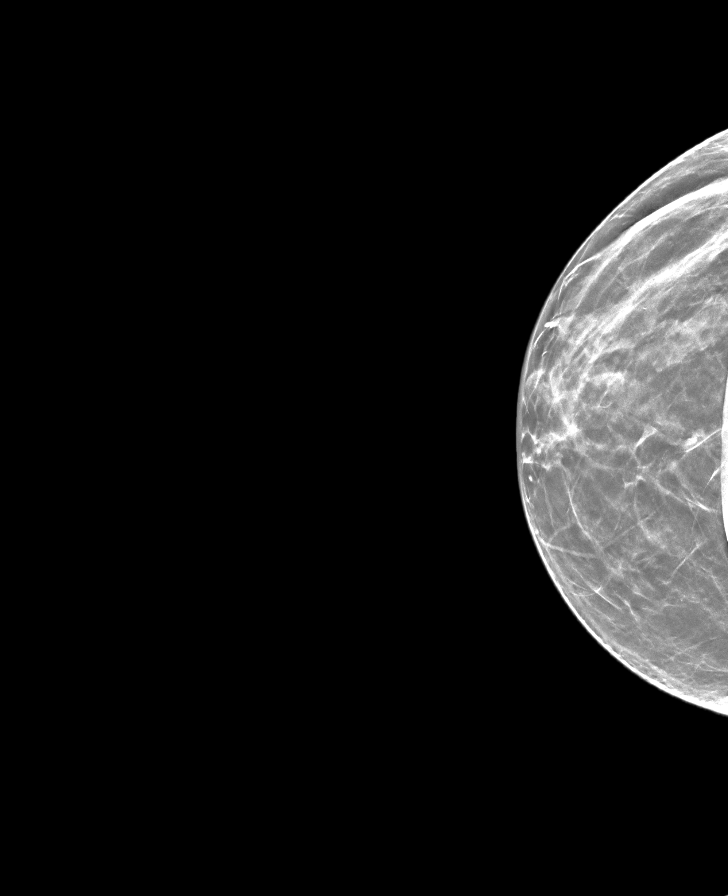

[L CC synth-2D]
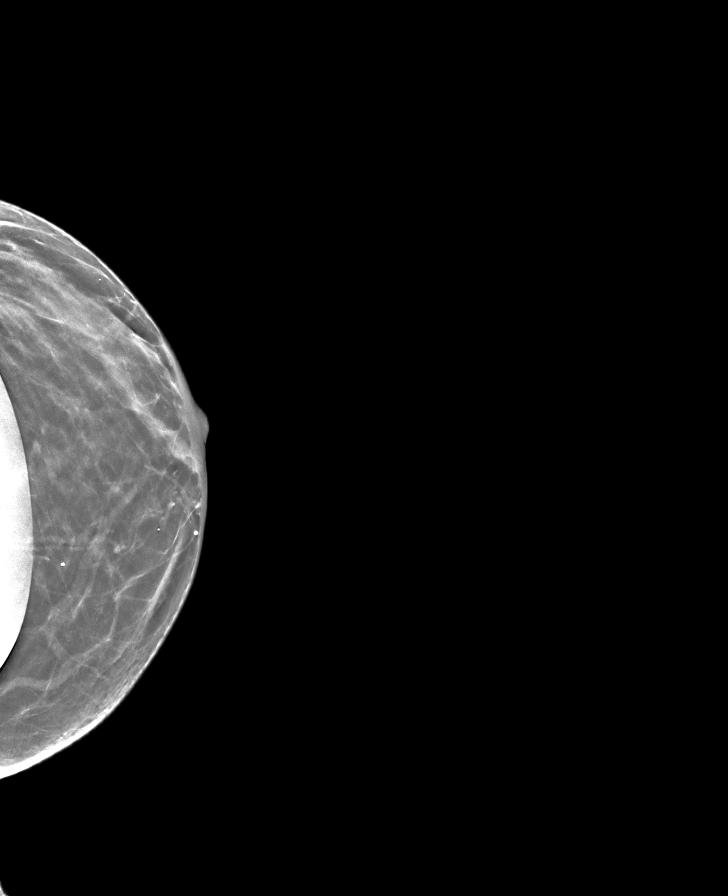

[8 of 32 positions shown; findings below may reference images not displayed]

ACR Breast Density Category c: The breast tissue is heterogeneously
dense, which may obscure small masses.
FINDINGS: Bilateral retroglandular silicone implants are unchanged.

There are no findings suspicious for malignancy. Images were
processed with CAD.
IMPRESSION: No mammographic evidence of malignancy. A result letter of this
screening mammogram will be mailed directly to the patient.

RECOMMENDATION:
Screening mammogram in one year. (Code:[VE])

BI-RADS CATEGORY  1:  Negative.

## 2018-05-12 ENCOUNTER — Other Ambulatory Visit: Payer: Self-pay | Admitting: Nurse Practitioner

## 2018-05-12 DIAGNOSIS — J00 Acute nasopharyngitis [common cold]: Secondary | ICD-10-CM

## 2018-06-16 ENCOUNTER — Other Ambulatory Visit: Payer: Self-pay | Admitting: Nurse Practitioner

## 2018-06-16 DIAGNOSIS — F314 Bipolar disorder, current episode depressed, severe, without psychotic features: Secondary | ICD-10-CM

## 2018-06-16 DIAGNOSIS — M792 Neuralgia and neuritis, unspecified: Secondary | ICD-10-CM

## 2018-06-16 MED ORDER — BUPROPION HCL ER (SR) 150 MG PO TB12: 150.0000 mg | ORAL_TABLET | Freq: Two times a day (BID) | ORAL | 0 refills | Status: DC

## 2018-06-16 MED ORDER — GABAPENTIN 300 MG PO CAPS: ORAL_CAPSULE | ORAL | 0 refills | Status: DC

## 2018-07-13 ENCOUNTER — Encounter: Payer: Self-pay | Admitting: Nurse Practitioner

## 2018-07-13 ENCOUNTER — Other Ambulatory Visit: Payer: Self-pay

## 2018-07-13 ENCOUNTER — Ambulatory Visit: Payer: BC Managed Care – PPO | Admitting: Nurse Practitioner

## 2018-07-13 VITALS — BP 133/74 | HR 92 | Temp 98.3°F | Ht 64.0 in | Wt 172.6 lb

## 2018-07-13 DIAGNOSIS — R1032 Left lower quadrant pain: Secondary | ICD-10-CM

## 2018-07-13 DIAGNOSIS — B001 Herpesviral vesicular dermatitis: Secondary | ICD-10-CM

## 2018-07-13 DIAGNOSIS — M792 Neuralgia and neuritis, unspecified: Secondary | ICD-10-CM | POA: Diagnosis not present

## 2018-07-13 LAB — POCT URINALYSIS DIPSTICK
Bilirubin, UA: NEGATIVE
Blood, UA: 5
Glucose, UA: NEGATIVE
Ketones, UA: NEGATIVE
Leukocytes, UA: NEGATIVE
Nitrite, UA: NEGATIVE
Protein, UA: NEGATIVE
Spec Grav, UA: <=1.005 — AB (ref 1.010–1.025)
Urobilinogen, UA: 0.2 E.U./dL
pH, UA: 5 (ref 5.0–8.0)

## 2018-07-13 MED ORDER — GABAPENTIN 300 MG PO CAPS: ORAL_CAPSULE | ORAL | 0 refills | Status: DC

## 2018-07-13 MED ORDER — VALACYCLOVIR HCL 1 G PO TABS: 1000.0000 mg | ORAL_TABLET | Freq: Three times a day (TID) | ORAL | 0 refills | Status: AC

## 2018-07-13 NOTE — Progress Notes (Signed)
Subjective:    Patient ID: Theresa [name], female    DOB: [birth year], 47 y.o.   MRN: [id number]  Theresa [name] is a 47 y.o. female presenting on 07/13/2018 for Abdominal Pain (severe LLQ sharp abdominal pain radiating around the back x 1 week., No urinary issues)   HPI Abdominal, lower pelvic pain Patient had return of pain with sudden, sharp onset Saturday afternoon.  Patient notes 1 week duration of lower abdominal / pelvis pain with radiation around the back x 10 days ago.  She has had to be out of work due to pain.   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  Is currently taking gabapentin 600 in am, 600 in pm, 300 mid day dose (added back her extra mid-day dose and increased evening dose). - Had been only taking 600 in am, 300 in pm until Saturday.  - Also has increased nausea with pain.  Took phenergan from surgery with relief.   - No change in bowel habits, BM increases pain exponentially. - Has had prior back injuries. No recent re-injury or radiculopathy noted. - Patient also notes recent re-flare of cold sores with persistent pain.  Social History   Tobacco Use  . Smoking status: Never Smoker  . Smokeless tobacco: Never Used  Substance Use Topics  . Alcohol use: Yes    Comment: rare-occ. monthly  . Drug use: No    Review of Systems Per HPI unless specifically indicated above     Objective:    BP 133/74   Pulse 92   Temp 98.3 F (36.8 C) (Oral)   Ht 5\' 4"  (1.626 m)   Wt 172 lb 9.6 oz (78.3 kg)   LMP 10/24/2014 (Approximate)   BMI 29.63 kg/m   Wt Readings from Last 3 Encounters:  07/13/18 172 lb 9.6 oz (78.3 kg)  04/10/18 174 lb 6.4 oz (79.1 kg)  07/01/17 177 lb (80.3 kg)    Physical Exam  Constitutional: She is oriented to person, place, and time. She appears well-developed and well-nourished. No  distress.  HENT:  Head: Normocephalic and atraumatic.  Mouth/Throat: Oral lesions (healing labial lesions) present.  Cardiovascular: Normal rate, regular rhythm, S1 normal, S2 normal, normal heart sounds and intact distal pulses.  Pulmonary/Chest: Effort normal and breath sounds normal. No respiratory distress.  Abdominal: Soft. Bowel sounds are normal. She exhibits no distension. There is no hepatosplenomegaly. There is tenderness in the left lower quadrant. No hernia.    Neurological: She is alert and oriented to person, place, and time.  Skin: Skin is warm and dry. Capillary refill takes less than 2 seconds.  Psychiatric: She has a normal mood and affect. Her behavior is normal.  Vitals reviewed.    Results for orders placed or performed in visit on 07/13/18  POCT Urinalysis Dipstick  Result Value Ref Range   Color, UA yellow    Clarity, UA clear    Glucose, UA Negative Negative   Bilirubin, UA negative    Ketones, UA negative    Spec Grav, UA <=1.005 (A) 1.010 - 1.025   Blood, UA 5.0    pH, UA 5.0 5.0 - 8.0   Protein, UA Negative Negative   Urobilinogen, UA 0.2 0.2 or 1.0 E.U./dL   Nitrite, UA negative    Leukocytes, UA Negative Negative   Appearance     Odor  Assessment & Plan:   Problem List Items Addressed This Visit      Other   Neuropathic pain Patient with sudden return of debilitating LLQ abdominal pain that is presumed to be neuropathic pain.  Patient is s/p total hysterectomy performed and completed related to abdominal pain.  No significant improvement after second surgery for completion.  6-12 months were required for pain to decrease to bearable level and now has had sudden return.  No known injury for exacerbation of pain.    Plan: 1. Start valacyclovir as pain is occurring with cold sore outbreak and healing lesions. 2. Continue gabapentin as ordered 600 mg in am, 300 mg in afternoon, 600 mg at bedtime.  3. Referral pain clinic. 4. Followup 2-3  weeks prn if no improvement.   Relevant Medications   valACYclovir (VALTREX) 1000 MG tablet   gabapentin (NEURONTIN) 300 MG capsule   Other Relevant Orders   Ambulatory referral to Pain Clinic    Other Visit Diagnoses    Recurrent cold sores    -  Primary Patient with recurrent cold sores.  May start valacyclovir for current outbreak.  Follow-up if needed for no improvement.   Relevant Medications   valACYclovir (VALTREX) 1000 MG tablet   Left lower quadrant abdominal pain     See neuropathic pain above.   Relevant Orders   POCT Urinalysis Dipstick (Completed)      Meds ordered this encounter  Medications  . valACYclovir (VALTREX) 1000 MG tablet    Sig: Take 1 tablet (1,000 mg total) by mouth 3 (three) times daily for 14 days.    Dispense:  21 tablet    Refill:  0    Order Specific Question:   Supervising Provider    Answer:   Olin Hauser [[id number]]  . gabapentin (NEURONTIN) 300 MG capsule    Sig: Take 2 capsules in am and 1 additional capsule during day as needed for pain. Take 2 capsules at bedtime.    Dispense:  450 capsule    Refill:  0    Order Specific Question:   Supervising Provider    Answer:   Olin Hauser [[id number]]    Follow up plan: Return 2-4 weeks if symptoms worsen or fail to improve.  Cassell Smiles, DNP, AGPCNP-BC Adult Gerontology Primary Care Nurse Practitioner [facility] 07/13/2018, 9:13 AM

## 2018-07-13 NOTE — Patient Instructions (Signed)
Candis Shine,   Thank you for coming in to clinic today.  1. Continue gabapentin at current dose 600 mg in am and pm and 300 mg at mid-day. - START lidocaine patch or muscle rub as needed for pain.    2. Cold sores - may take valtrex.  1,000 mg three times daily for 7 days.  This nerve pain may possibly be an internal shingles.  This valtrex will help treat that as well.  Not a high suspicion, but possible.   Please schedule a follow-up appointment with Cassell Smiles, AGNP. No follow-ups on file.  If you have any other questions or concerns, please feel free to call the clinic or send a message through [place]. You may also schedule an earlier appointment if necessary.  You will receive a survey after today's visit either digitally by e-mail or paper by [employer]. Your experiences and feedback matter to [place].  Please respond so we know how we are doing as we provide care for you.   Cassell Smiles, DNP, AGNP-BC Adult Gerontology Nurse Practitioner [place]

## 2018-07-15 ENCOUNTER — Other Ambulatory Visit: Payer: Self-pay | Admitting: Nurse Practitioner

## 2018-07-15 DIAGNOSIS — M792 Neuralgia and neuritis, unspecified: Secondary | ICD-10-CM

## 2018-07-15 DIAGNOSIS — R1032 Left lower quadrant pain: Secondary | ICD-10-CM

## 2018-07-15 MED ORDER — TRAMADOL HCL 50 MG PO TABS: 50.0000 mg | ORAL_TABLET | Freq: Three times a day (TID) | ORAL | 0 refills | Status: AC | PRN

## 2018-07-15 NOTE — Progress Notes (Signed)
Received paperwork for patient for FMLA for 3-day absence.  Pain is preventing patient from working as discussed at last visit.  Pain is persistent and actually worse today than in a long time.  At last surgery, patient reminds me the noted general inflammation at site. Pain relieving measures to date: - Ice helps numb short term. - Heat not helping either.  - Has tried family member's diclofenac topically without relief. - Is taking meloxicam 7.5 daily without significant relief. - Gabapentin with no significant relief.  - Is not doing any lifting as precaution.  - Pain management referral pending.  START tramadol 50 mg tid prn severe abdominal pain.  Will provide only 5 day supply.

## 2018-07-16 ENCOUNTER — Encounter: Payer: Self-pay | Admitting: Nurse Practitioner

## 2018-07-21 ENCOUNTER — Encounter: Payer: Self-pay | Admitting: Nurse Practitioner

## 2018-07-21 NOTE — Telephone Encounter (Signed)
Mychart message

## 2018-07-27 ENCOUNTER — Ambulatory Visit: Payer: BC Managed Care – PPO | Attending: Nurse Practitioner | Admitting: Nurse Practitioner

## 2018-07-27 ENCOUNTER — Encounter: Payer: Self-pay | Admitting: Nurse Practitioner

## 2018-07-27 ENCOUNTER — Other Ambulatory Visit: Payer: Self-pay

## 2018-07-27 VITALS — BP 131/89 | HR 88 | Temp 97.7°F | Ht 65.0 in | Wt 170.0 lb

## 2018-07-27 DIAGNOSIS — Z9884 Bariatric surgery status: Secondary | ICD-10-CM | POA: Diagnosis not present

## 2018-07-27 DIAGNOSIS — F4312 Post-traumatic stress disorder, chronic: Secondary | ICD-10-CM | POA: Insufficient documentation

## 2018-07-27 DIAGNOSIS — F319 Bipolar disorder, unspecified: Secondary | ICD-10-CM | POA: Diagnosis not present

## 2018-07-27 DIAGNOSIS — R1032 Left lower quadrant pain: Secondary | ICD-10-CM

## 2018-07-27 DIAGNOSIS — M545 Low back pain, unspecified: Secondary | ICD-10-CM | POA: Insufficient documentation

## 2018-07-27 DIAGNOSIS — M899 Disorder of bone, unspecified: Secondary | ICD-10-CM | POA: Insufficient documentation

## 2018-07-27 DIAGNOSIS — G8929 Other chronic pain: Secondary | ICD-10-CM

## 2018-07-27 DIAGNOSIS — G4733 Obstructive sleep apnea (adult) (pediatric): Secondary | ICD-10-CM | POA: Insufficient documentation

## 2018-07-27 DIAGNOSIS — I10 Essential (primary) hypertension: Secondary | ICD-10-CM | POA: Diagnosis not present

## 2018-07-27 DIAGNOSIS — K7581 Nonalcoholic steatohepatitis (NASH): Secondary | ICD-10-CM | POA: Diagnosis not present

## 2018-07-27 DIAGNOSIS — E785 Hyperlipidemia, unspecified: Secondary | ICD-10-CM | POA: Diagnosis not present

## 2018-07-27 DIAGNOSIS — Z79899 Other long term (current) drug therapy: Secondary | ICD-10-CM

## 2018-07-27 DIAGNOSIS — G894 Chronic pain syndrome: Secondary | ICD-10-CM | POA: Insufficient documentation

## 2018-07-27 DIAGNOSIS — K219 Gastro-esophageal reflux disease without esophagitis: Secondary | ICD-10-CM | POA: Insufficient documentation

## 2018-07-27 DIAGNOSIS — M792 Neuralgia and neuritis, unspecified: Secondary | ICD-10-CM | POA: Insufficient documentation

## 2018-07-27 DIAGNOSIS — Z789 Other specified health status: Secondary | ICD-10-CM | POA: Insufficient documentation

## 2018-07-27 NOTE — Patient Instructions (Addendum)
____________________________________________________________________________________________  Appointment Policy Summary  It is our goal and responsibility to provide the medical community with assistance in the evaluation and management of patients with chronic pain. Unfortunately our resources are limited. Because we do not have an unlimited amount of time, or available appointments, we are required to closely monitor and manage their use. The following rules exist to maximize their use:  Patient's responsibilities: 1. Punctuality:  At what time should I arrive? You should be physically present in our office 30 minutes before your scheduled appointment. Your scheduled appointment is with your assigned healthcare provider. However, it takes 5-10 minutes to be "checked-in", and another 15 minutes for the nurses to do the admission. If you arrive to our office at the time you were given for your appointment, you will end up being at least 20-25 minutes late to your appointment with the provider. 2. Tardiness:  What happens if I arrive only a few minutes after my scheduled appointment time? You will need to reschedule your appointment. The cutoff is your appointment time. This is why it is so important that you arrive at least 30 minutes before that appointment. If you have an appointment scheduled for 10:00 AM and you arrive at 10:01, you will be required to reschedule your appointment.  3. Plan ahead:  Always assume that you will encounter traffic on your way in. Plan for it. If you are dependent on a driver, make sure they understand these rules and the need to arrive early. 4. Other appointments and responsibilities:  Avoid scheduling any other appointments before or after your pain clinic appointments.  5. Be prepared:  Write down everything that you need to discuss with your healthcare provider and give this information to the admitting nurse. Write down the medications that you will need  refilled. Bring your pills and bottles (even the empty ones), to all of your appointments, except for those where a procedure is scheduled. 6. No children or pets:  Find someone to take care of them. It is not appropriate to bring them in. 7. Scheduling changes:  We request "advanced notification" of any changes or cancellations. 8. Advanced notification:  Defined as a time period of more than 24 hours prior to the originally scheduled appointment. This allows for the appointment to be offered to other patients. 9. Rescheduling:  When a visit is rescheduled, it will require the cancellation of the original appointment. For this reason they both fall within the category of "Cancellations".  10. Cancellations:  They require advanced notification. Any cancellation less than 24 hours before the  appointment will be recorded as a "No Show". 11. No Show:  Defined as an unkept appointment where the patient failed to notify or declare to the practice their intention or inability to keep the appointment.  Corrective process for repeat offenders:  1. Tardiness: Three (3) episodes of rescheduling due to late arrivals will be recorded as one (1) "No Show". 2. Cancellation or reschedule: Three (3) cancellations or rescheduling will be recorded as one (1) "No Show". 3. "No Shows": Three (3) "No Shows" within a 12 month period will result in discharge from the practice. ____________________________________________________________________________________________   ______________________________________________________________________________________________  Specialty Pain Scale  Introduction:  There are significant differences in how pain is reported. The word pain usually refers to physical pain, but it is also a common synonym of suffering. The medical community uses a scale from 0 (zero) to 10 (ten) to report pain level. Zero (0) is described as "no pain",  while ten (10) is described as "the worse pain  you can imagine". The problem with this scale is that physical pain is reported along with suffering. Suffering refers to mental pain, or more often yet it refers to any unpleasant feeling, emotion or aversion associated with the perception of harm or threat of harm. It is the psychological component of pain.  Pain Specialists prefer to separate the two components. The pain scale used by this practice is the Verbal Numerical Rating Scale (VNRS-11). This scale is for the physical pain only. DO NOT INCLUDE how your pain psychologically affects you. This scale is for adults 45 years of age and older. It has 11 (eleven) levels. The 1st level is 0/10. This means: "right now, I have no pain". In the context of pain management, it also means: "right now, my physical pain is under control with the current therapy".  General Information:  The scale should reflect your current level of pain. Unless you are specifically asked for the level of your worst pain, or your average pain. If you are asked for one of these two, then it should be understood that it is over the past 24 hours.  Levels 1 (one) through 5 (five) are described below, and can be treated as an outpatient. Ambulatory pain management facilities such as ours are more than adequate to treat these levels. Levels 6 (six) through 10 (ten) are also described below, however, these must be treated as a hospitalized patient. While levels 6 (six) and 7 (seven) may be evaluated at an urgent care facility, levels 8 (eight) through 10 (ten) constitute medical emergencies and as such, they belong in a hospital's emergency department. When having these levels (as described below), do not come to our office. Our facility is not equipped to manage these levels. Go directly to an urgent care facility or an emergency department to be evaluated.  Definitions:  Activities of Daily Living (ADL): Activities of daily living (ADL or ADLs) is a term used in healthcare to refer to  people's daily self-care activities. Health professionals often use a person's ability or inability to perform ADLs as a measurement of their functional status, particularly in regard to people post injury, with disabilities and the elderly. There are two ADL levels: Basic and Instrumental. Basic Activities of Daily Living (BADL  or BADLs) consist of self-care tasks that include: Bathing and showering; personal hygiene and grooming (including brushing/combing/styling hair); dressing; Toilet hygiene (getting to the toilet, cleaning oneself, and getting back up); eating and self-feeding (not including cooking or chewing and swallowing); functional mobility, often referred to as "transferring", as measured by the ability to walk, get in and out of bed, and get into and out of a chair; the broader definition (moving from one place to another while performing activities) is useful for people with different physical abilities who are still able to get around independently. Basic ADLs include the things many people do when they get up in the morning and get ready to go out of the house: get out of bed, go to the toilet, bathe, dress, groom, and eat. On the average, loss of function typically follows a particular order. Hygiene is the first to go, followed by loss of toilet use and locomotion. The last to go is the ability to eat. When there is only one remaining area in which the person is independent, there is a 62.9% chance that it is eating and only a 3.5% chance that it is hygiene. Instrumental Activities  of Daily Living (IADL or IADLs) are not necessary for fundamental functioning, but they let an individual live independently in a community. IADL consist of tasks that include: cleaning and maintaining the house; home establishment and maintenance; care of others (including selecting and supervising caregivers); care of pets; child rearing; managing money; managing financials (investments, etc.); meal preparation  and cleanup; shopping for groceries and necessities; moving within the community; safety procedures and emergency responses; health management and maintenance (taking prescribed medications); and using the telephone or other form of communication.  Instructions:  Most patients tend to report their pain as a combination of two factors, their physical pain and their psychosocial pain. This last one is also known as "suffering" and it is reflection of how physical pain affects you socially and psychologically. From now on, report them separately.  From this point on, when asked to report your pain level, report only your physical pain. Use the following table for reference.  Pain Clinic Pain Levels (0-5/10)  Pain Level Score  Description  No Pain 0   Mild pain 1 Nagging, annoying, but does not interfere with basic activities of daily living (ADL). Patients are able to eat, bathe, get dressed, toileting (being able to get on and off the toilet and perform personal hygiene functions), transfer (move in and out of bed or a chair without assistance), and maintain continence (able to control bladder and bowel functions). Blood pressure and heart rate are unaffected. A normal heart rate for a healthy adult ranges from 60 to 100 bpm (beats per minute).   Mild to moderate pain 2 Noticeable and distracting. Impossible to hide from other people. More frequent flare-ups. Still possible to adapt and function close to normal. It can be very annoying and may have occasional stronger flare-ups. With discipline, patients may get used to it and adapt.   Moderate pain 3 Interferes significantly with activities of daily living (ADL). It becomes difficult to feed, bathe, get dressed, get on and off the toilet or to perform personal hygiene functions. Difficult to get in and out of bed or a chair without assistance. Very distracting. With effort, it can be ignored when deeply involved in activities.   Moderately severe pain  4 Impossible to ignore for more than a few minutes. With effort, patients may still be able to manage work or participate in some social activities. Very difficult to concentrate. Signs of autonomic nervous system discharge are evident: dilated pupils (mydriasis); mild sweating (diaphoresis); sleep interference. Heart rate becomes elevated (>115 bpm). Diastolic blood pressure (lower number) rises above 100 mmHg. Patients find relief in laying down and not moving.   Severe pain 5 Intense and extremely unpleasant. Associated with frowning face and frequent crying. Pain overwhelms the senses.  Ability to do any activity or maintain social relationships becomes significantly limited. Conversation becomes difficult. Pacing back and forth is common, as getting into a comfortable position is nearly impossible. Pain wakes you up from deep sleep. Physical signs will be obvious: pupillary dilation; increased sweating; goosebumps; brisk reflexes; cold, clammy hands and feet; nausea, vomiting or dry heaves; loss of appetite; significant sleep disturbance with inability to fall asleep or to remain asleep. When persistent, significant weight loss is observed due to the complete loss of appetite and sleep deprivation.  Blood pressure and heart rate becomes significantly elevated. Caution: If elevated blood pressure triggers a pounding headache associated with blurred vision, then the patient should immediately seek attention at an urgent or emergency care unit, as  these may be signs of an impending stroke.    Emergency Department Pain Levels (6-10/10)  Emergency Room Pain 6 Severely limiting. Requires emergency care and should not be seen or managed at an outpatient pain management facility. Communication becomes difficult and requires great effort. Assistance to reach the emergency department may be required. Facial flushing and profuse sweating along with potentially dangerous increases in heart rate and blood pressure  will be evident.   Distressing pain 7 Self-care is very difficult. Assistance is required to transport, or use restroom. Assistance to reach the emergency department will be required. Tasks requiring coordination, such as bathing and getting dressed become very difficult.   Disabling pain 8 Self-care is no longer possible. At this level, pain is disabling. The individual is unable to do even the most "basic" activities such as walking, eating, bathing, dressing, transferring to a bed, or toileting. Fine motor skills are lost. It is difficult to think clearly.   Incapacitating pain 9 Pain becomes incapacitating. Thought processing is no longer possible. Difficult to remember your own name. Control of movement and coordination are lost.   The worst pain imaginable 10 At this level, most patients pass out from pain. When this level is reached, collapse of the autonomic nervous system occurs, leading to a sudden drop in blood pressure and heart rate. This in turn results in a temporary and dramatic drop in blood flow to the brain, leading to a loss of consciousness. Fainting is one of the body's self defense mechanisms. Passing out puts the brain in a calmed state and causes it to shut down for a while, in order to begin the healing process.    Summary: 1. Refer to this scale when providing [place] with your pain level. 2. Be accurate and careful when reporting your pain level. This will help with your care. 3. Over-reporting your pain level will lead to loss of credibility. 4. Even a level of 1/10 means that there is pain and will be treated at our facility. 5. High, inaccurate reporting will be documented as "Symptom Exaggeration", leading to loss of credibility and suspicions of possible secondary gains such as obtaining more narcotics, or wanting to appear disabled, for fraudulent reasons. 6. Only pain levels of 5 or below will be seen at our facility. 7. Pain levels of 6 and above will be sent to the  Emergency Department and the appointment cancelled. ______________________________________________________________________________________________   BMI Assessment: Estimated body mass index is 28.29 kg/m as calculated from the following:   Height as of this encounter: 5' 5"  (1.651 m).   Weight as of this encounter: 170 lb (77.1 kg).  BMI interpretation table: BMI level Category Range association with higher incidence of chronic pain  <18 kg/m2 Underweight   18.5-24.9 kg/m2 Ideal body weight   25-29.9 kg/m2 Overweight Increased incidence by 20%  30-34.9 kg/m2 Obese (Class I) Increased incidence by 68%  35-39.9 kg/m2 Severe obesity (Class II) Increased incidence by 136%  >40 kg/m2 Extreme obesity (Class III) Increased incidence by 254%   BMI Readings from Last 4 Encounters:  07/27/18 28.29 kg/m  07/13/18 29.63 kg/m  04/10/18 29.94 kg/m  07/01/17 30.38 kg/m   Wt Readings from Last 4 Encounters:  07/27/18 170 lb (77.1 kg)  07/13/18 172 lb 9.6 oz (78.3 kg)  04/10/18 174 lb 6.4 oz (79.1 kg)  07/01/17 177 lb (80.3 kg)

## 2018-07-27 NOTE — Progress Notes (Signed)
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 distress  Cervical Spine Exam  Inspection: No masses, redness, or swelling Alignment: Symmetrical Functional ROM: Unrestricted ROM      Stability: No instability detected Muscle strength & Tone: Functionally intact Sensory: Unimpaired Palpation: No palpable anomalies              Upper Extremity (UE) Exam    Side: Right upper extremity  Side: Left upper extremity  Inspection: No masses, redness, swelling, or asymmetry. No contractures  Inspection: No masses, redness, swelling, or asymmetry. No contractures  Functional ROM: Unrestricted ROM          Functional ROM: Unrestricted ROM          Muscle strength & Tone: Functionally intact  Muscle strength & Tone: Functionally intact  Sensory: Unimpaired  Sensory: Unimpaired  Palpation: No palpable anomalies              Palpation: No palpable anomalies              Specialized Test(s): Deferred         Specialized Test(s): Deferred          Thoracic Spine Exam  Inspection: No masses,  redness, or swelling Alignment: Symmetrical Functional ROM: Unrestricted ROM Stability: No instability detected Sensory: Unimpaired Muscle strength & Tone: No palpable anomalies   Abdomen: Tenderness with palpation to lower quadrants with rebound tenderness  Lumbar Spine Exam  Inspection: No masses, redness, or swelling Alignment: Symmetrical  Functional ROM: Unrestricted ROM      Stability: No instability detected Muscle strength & Tone: Functionally intact Sensory: Unimpaired Palpation: Non-tender       Provocative Tests: Lumbar Hyperextension and rotation test: Positive on the left for facet joint pain. Patrick's Maneuver: Negative                    Gait & Posture Assessment  Ambulation: Unassisted Gait: Relatively normal for age and body habitus Posture: WNL   Lower Extremity Exam    Side: Right lower extremity  Side: Left lower extremity  Inspection: No masses, redness, swelling, or asymmetry. No contractures  Inspection: No masses, redness, swelling, or asymmetry. No contractures  Functional ROM: Unrestricted ROM          Functional ROM: Unrestricted ROM          Muscle strength & Tone: Able to Toe-walk & Heel-walk without problems  Muscle strength & Tone: Able to Toe-walk & Heel-walk without problems  Sensory: Unimpaired  Sensory: Unimpaired  Palpation: No palpable anomalies  Palpation: No palpable anomalies   Assessment  Primary Diagnosis & Pertinent Problem List: The primary encounter diagnosis was Abdominal pain, chronic, left lower quadrant (Primary Area of Pain). Diagnoses of Chronic left-sided low back pain without sciatica, Chronic pain syndrome, Pharmacologic therapy, Disorder of skeletal system, and Problems influencing health status were also pertinent to this visit.  Visit Diagnosis: 1. Abdominal pain, chronic, left lower quadrant (Primary Area of Pain)   2. Chronic left-sided low back pain without sciatica   3. Chronic pain syndrome   4. Pharmacologic  therapy   5. Disorder of skeletal system   6. Problems influencing health status    Plan of Care  Initial treatment plan:  Please be advised that as per protocol, today's visit has been an evaluation only. We have not taken over the patient's controlled substance management.  Problem-specific plan: No problem-specific Assessment & Plan notes found for this encounter.  Ordered Lab-work, Procedure(s), Referral(s), & Consult(s): Orders Placed This Encounter  Procedures  . Compliance Drug Analysis, Ur  . Magnesium  . Sedimentation rate  . C-reactive protein  . Ambulatory referral to Physical Therapy   Pharmacotherapy: Medications ordered:  No orders of the defined types were placed in this encounter. She is not interested in any narcotics.  Instructed patient that increase in the gabapentin will probably be the best treatment option along with physical therapy. Medications administered during this visit: Reighlyn R. Gwenlyn Perking had no medications administered during this visit.   Pharmacotherapy under consideration:  Opioid Analgesics: The patient was informed that there is no guarantee that she would be a candidate for opioid analgesics. The decision will be made following CDC guidelines. This decision will be based on the results of diagnostic studies, as well as Ms. Albert's risk profile.  Membrane stabilizer: To be determined at a later time Muscle relaxant: To be determined at a later time NSAID: To be determined at a later time Other analgesic(s): To be determined at a later time   Interventional therapies under consideration: Ms. Gwenlyn Perking was informed that there is no guarantee that she would be a candidate for interventional therapies. The decision will be based on the results of diagnostic studies, as well as Ms. Albert's risk profile.  Possible procedure(s): Diagnostic celiac plexus nerve block Diagnostic left-sided lumbar facet nerve block   Provider-requested follow-up: Return  for 2nd Visit, w/ Dr. Holley Raring.  Future Appointments  Date Time Provider [place]  08/11/2018  11:45 AM Gillis Santa, MD [facility] None    Primary Care Physician: Mikey College, NP Location: [name] Outpatient Pain Management Facility Note by:  Date: 07/27/2018; Time: 3:35 PM  Pain Score Disclaimer: We use the NRS-11 scale. This is a self-reported, subjective measurement of pain severity with only modest accuracy. It is used primarily to identify changes within a particular patient. It must be understood that outpatient pain scales are significantly less accurate that those used for research, where they can be applied under ideal controlled circumstances with minimal exposure to variables. In reality, the score is likely to be a combination of pain intensity and pain affect, where pain affect describes the degree of emotional arousal or changes in action readiness caused by the sensory experience of pain. Factors such as social and work situation, setting, emotional state, anxiety levels, expectation, and prior pain experience may influence pain perception and show large inter-individual differences that may also be affected by time variables.  Patient instructions provided during this appointment: Patient Instructions   ____________________________________________________________________________________________  Appointment Policy Summary  It is our goal and responsibility to provide the medical community with assistance in the evaluation and management of patients with chronic pain. Unfortunately our resources are limited. Because we do not have an unlimited amount of time, or available appointments, we are required to closely monitor and manage their use. The following rules exist to maximize their use:  Patient's responsibilities: 1. Punctuality:  At what time should I arrive? You should be physically present in our office 30 minutes before your scheduled appointment. Your  scheduled appointment is with your assigned healthcare provider. However, it takes 5-10 minutes to be "checked-in", and another 15 minutes for the nurses to do the admission. If you arrive to our office at the time you were given for your appointment, you will end up being at least 20-25 minutes late to your appointment with the provider. 2. Tardiness:  What happens if I arrive only a few minutes after my scheduled appointment time? You will need to reschedule your appointment. The cutoff is your appointment time. This is why it is so important that you arrive at least 30 minutes before that appointment. If you have an appointment scheduled for 10:00 AM and you arrive at 10:01, you will be required to reschedule your appointment.  3. Plan ahead:  Always assume that you will encounter traffic on your way in. Plan for it. If you are dependent on a driver, make sure they understand these rules and the need to arrive early. 4. Other appointments and responsibilities:  Avoid scheduling any other appointments before or after your pain clinic appointments.  5. Be prepared:  Write down everything that you need to discuss with your healthcare provider and give this information to the admitting nurse. Write down the medications that you will need refilled. Bring your pills and bottles (even the empty ones), to all of your appointments, except for those where a procedure is scheduled. 6. No children or pets:  Find someone to take care of them. It is not appropriate to bring them in. 7. Scheduling changes:  We request "advanced notification" of any changes or cancellations. 8. Advanced notification:  Defined as a time period of more than 24 hours prior to the originally scheduled appointment. This allows for the appointment to be offered to other patients. 9. Rescheduling:  When a visit is rescheduled, it will require the cancellation of the original appointment. For this reason they both fall within the  category of "Cancellations".  10. Cancellations:  They require advanced notification. Any cancellation less than 24 hours before the  appointment will be recorded as a "No Show". 11. No Show:  Defined as an unkept appointment where the patient failed to notify or declare to the practice their intention or inability to keep the appointment.  Corrective process for repeat offenders:  1. Tardiness: Three (3) episodes of rescheduling due to late arrivals will be recorded as one (1) "No Show". 2. Cancellation or reschedule: Three (3) cancellations or rescheduling will be recorded as one (1) "No Show". 3. "No Shows": Three (3) "No Shows" within a 12 month period will result in discharge from the practice. ____________________________________________________________________________________________   ______________________________________________________________________________________________  Specialty Pain Scale  Introduction:  There are significant differences in how pain is reported. The word pain usually refers to physical pain, but it is also a common synonym of suffering. The medical community uses a scale from 0 (zero) to 10 (ten) to report pain level. Zero (0) is described as "no pain", while ten (10) is described as "the worse pain you can imagine". The problem with this scale is that physical pain is reported along with suffering. Suffering refers to mental pain, or more often yet it refers to any unpleasant feeling, emotion or aversion associated with the perception of harm or threat of harm. It is the psychological component of pain.  Pain Specialists prefer to separate the two components. The pain scale used by this practice is the Verbal Numerical Rating Scale (VNRS-11). This scale is for the physical pain only. DO NOT INCLUDE how your pain psychologically affects you. This scale is for adults 65 years of age and older. It has 11 (eleven) levels. The 1st level is 0/10. This means: "right  now, I have no pain". In the context of pain management, it also means: "right now, my physical pain is under control with the current therapy".  General Information:  The scale should reflect your current level of pain. Unless you are specifically asked for the level of your worst pain, or your average pain. If you are asked for one of these two, then it should be understood that it is over the past 24 hours.  Levels 1 (one) through 5 (five) are described below, and can be treated as an outpatient. Ambulatory pain management facilities such as ours are more than adequate to treat these levels. Levels 6 (six) through 10 (ten) are also described below, however, these must be treated as a hospitalized patient. While levels 6 (six) and 7 (seven) may be evaluated at an urgent care facility, levels 8 (eight) through 10 (ten) constitute medical emergencies and as such, they belong in a hospital's emergency department. When having these levels (as described below), do not come to our office. Our facility is not equipped to manage these levels. Go directly to an urgent care facility or an emergency department to be evaluated.  Definitions:  Activities of Daily Living (ADL): Activities of daily living (ADL or ADLs) is a term used in healthcare to refer to people's daily self-care activities. Health professionals often use a person's ability or inability to perform ADLs as a measurement of their functional status, particularly in regard to people post injury, with disabilities and the elderly. There are two ADL levels: Basic and Instrumental. Basic Activities of Daily Living (BADL  or BADLs) consist of self-care tasks that include: Bathing and showering; personal hygiene and grooming (including brushing/combing/styling hair); dressing; Toilet hygiene (getting to the  toilet, cleaning oneself, and getting back up); eating and self-feeding (not including cooking or chewing and swallowing); functional mobility, often  referred to as "transferring", as measured by the ability to walk, get in and out of bed, and get into and out of a chair; the broader definition (moving from one place to another while performing activities) is useful for people with different physical abilities who are still able to get around independently. Basic ADLs include the things many people do when they get up in the morning and get ready to go out of the house: get out of bed, go to the toilet, bathe, dress, groom, and eat. On the average, loss of function typically follows a particular order. Hygiene is the first to go, followed by loss of toilet use and locomotion. The last to go is the ability to eat. When there is only one remaining area in which the person is independent, there is a 62.9% chance that it is eating and only a 3.5% chance that it is hygiene. Instrumental Activities of Daily Living (IADL or IADLs) are not necessary for fundamental functioning, but they let an individual live independently in a community. IADL consist of tasks that include: cleaning and maintaining the house; home establishment and maintenance; care of others (including selecting and supervising caregivers); care of pets; child rearing; managing money; managing financials (investments, etc.); meal preparation and cleanup; shopping for groceries and necessities; moving within the community; safety procedures and emergency responses; health management and maintenance (taking prescribed medications); and using the telephone or other form of communication.  Instructions:  Most patients tend to report their pain as a combination of two factors, their physical pain and their psychosocial pain. This last one is also known as "suffering" and it is reflection of how physical pain affects you socially and psychologically. From now on, report them separately.  From this point on, when asked to report your pain level, report only your physical pain. Use the following table for  reference.  Pain Clinic Pain Levels (0-5/10)  Pain Level Score  Description  No Pain 0   Mild pain 1 Nagging, annoying, but does not interfere with basic activities of daily living (ADL). Patients are able to eat, bathe, get dressed, toileting (being able to get on and off the toilet and perform personal hygiene functions), transfer (move in and out of bed or a chair without assistance), and maintain continence (able to control bladder and bowel functions). Blood pressure and heart rate are unaffected. A normal heart rate for a healthy adult ranges from 60 to 100 bpm (beats per minute).   Mild to moderate pain 2 Noticeable and distracting. Impossible to hide from other people. More frequent flare-ups. Still possible to adapt and function close to normal. It can be very annoying and may have occasional stronger flare-ups. With discipline, patients may get used to it and adapt.   Moderate pain 3 Interferes significantly with activities of daily living (ADL). It becomes difficult to feed, bathe, get dressed, get on and off the toilet or to perform personal hygiene functions. Difficult to get in and out of bed or a chair without assistance. Very distracting. With effort, it can be ignored when deeply involved in activities.   Moderately severe pain 4 Impossible to ignore for more than a few minutes. With effort, patients may still be able to manage work or participate in some social activities. Very difficult to concentrate. Signs of autonomic nervous system discharge are evident: dilated pupils (mydriasis); mild  sweating (diaphoresis); sleep interference. Heart rate becomes elevated (>115 bpm). Diastolic blood pressure (lower number) rises above 100 mmHg. Patients find relief in laying down and not moving.   Severe pain 5 Intense and extremely unpleasant. Associated with frowning face and frequent crying. Pain overwhelms the senses.  Ability to do any activity or maintain social relationships becomes  significantly limited. Conversation becomes difficult. Pacing back and forth is common, as getting into a comfortable position is nearly impossible. Pain wakes you up from deep sleep. Physical signs will be obvious: pupillary dilation; increased sweating; goosebumps; brisk reflexes; cold, clammy hands and feet; nausea, vomiting or dry heaves; loss of appetite; significant sleep disturbance with inability to fall asleep or to remain asleep. When persistent, significant weight loss is observed due to the complete loss of appetite and sleep deprivation.  Blood pressure and heart rate becomes significantly elevated. Caution: If elevated blood pressure triggers a pounding headache associated with blurred vision, then the patient should immediately seek attention at an urgent or emergency care unit, as these may be signs of an impending stroke.    Emergency Department Pain Levels (6-10/10)  Emergency Room Pain 6 Severely limiting. Requires emergency care and should not be seen or managed at an outpatient pain management facility. Communication becomes difficult and requires great effort. Assistance to reach the emergency department may be required. Facial flushing and profuse sweating along with potentially dangerous increases in heart rate and blood pressure will be evident.   Distressing pain 7 Self-care is very difficult. Assistance is required to transport, or use restroom. Assistance to reach the emergency department will be required. Tasks requiring coordination, such as bathing and getting dressed become very difficult.   Disabling pain 8 Self-care is no longer possible. At this level, pain is disabling. The individual is unable to do even the most "basic" activities such as walking, eating, bathing, dressing, transferring to a bed, or toileting. Fine motor skills are lost. It is difficult to think clearly.   Incapacitating pain 9 Pain becomes incapacitating. Thought processing is no longer possible.  Difficult to remember your own name. Control of movement and coordination are lost.   The worst pain imaginable 10 At this level, most patients pass out from pain. When this level is reached, collapse of the autonomic nervous system occurs, leading to a sudden drop in blood pressure and heart rate. This in turn results in a temporary and dramatic drop in blood flow to the brain, leading to a loss of consciousness. Fainting is one of the body's self defense mechanisms. Passing out puts the brain in a calmed state and causes it to shut down for a while, in order to begin the healing process.    Summary: 1. Refer to this scale when providing [place] with your pain level. 2. Be accurate and careful when reporting your pain level. This will help with your care. 3. Over-reporting your pain level will lead to loss of credibility. 4. Even a level of 1/10 means that there is pain and will be treated at our facility. 5. High, inaccurate reporting will be documented as "Symptom Exaggeration", leading to loss of credibility and suspicions of possible secondary gains such as obtaining more narcotics, or wanting to appear disabled, for fraudulent reasons. 6. Only pain levels of 5 or below will be seen at our facility. 7. Pain levels of 6 and above will be sent to the Emergency Department and the appointment cancelled. ______________________________________________________________________________________________   BMI Assessment: Estimated body mass index is  28.29 kg/m as calculated from the following:   Height as of this encounter: 5' 5"  (1.651 m).   Weight as of this encounter: 170 lb (77.1 kg).  BMI interpretation table: BMI level Category Range association with higher incidence of chronic pain  <18 kg/m2 Underweight   18.5-24.9 kg/m2 Ideal body weight   25-29.9 kg/m2 Overweight Increased incidence by 20%  30-34.9 kg/m2 Obese (Class I) Increased incidence by 68%  35-39.9 kg/m2 Severe obesity (Class II)  Increased incidence by 136%  >40 kg/m2 Extreme obesity (Class III) Increased incidence by 254%   BMI Readings from Last 4 Encounters:  07/27/18 28.29 kg/m  07/13/18 29.63 kg/m  04/10/18 29.94 kg/m  07/01/17 30.38 kg/m   Wt Readings from Last 4 Encounters:  07/27/18 170 lb (77.1 kg)  07/13/18 172 lb 9.6 oz (78.3 kg)  04/10/18 174 lb 6.4 oz (79.1 kg)  07/01/17 177 lb (80.3 kg)

## 2018-07-28 LAB — SEDIMENTATION RATE: Sed Rate: 2 mm/hr (ref 0–32)

## 2018-07-28 LAB — MAGNESIUM: MAGNESIUM: 2.1 mg/dL (ref 1.6–2.3)

## 2018-07-28 LAB — C-REACTIVE PROTEIN: CRP: 2 mg/L (ref 0–10)

## 2018-07-31 LAB — COMPLIANCE DRUG ANALYSIS, UR

## 2018-08-11 ENCOUNTER — Ambulatory Visit
Payer: BC Managed Care – PPO | Attending: Student in an Organized Health Care Education/Training Program | Admitting: Student in an Organized Health Care Education/Training Program

## 2018-08-11 ENCOUNTER — Other Ambulatory Visit: Payer: Self-pay

## 2018-08-11 ENCOUNTER — Encounter: Payer: Self-pay | Admitting: Student in an Organized Health Care Education/Training Program

## 2018-08-11 VITALS — BP 144/100 | HR 76 | Temp 98.0°F | Resp 16 | Ht 65.0 in | Wt 172.0 lb

## 2018-08-11 DIAGNOSIS — M545 Low back pain: Secondary | ICD-10-CM | POA: Diagnosis not present

## 2018-08-11 DIAGNOSIS — G4733 Obstructive sleep apnea (adult) (pediatric): Secondary | ICD-10-CM | POA: Diagnosis not present

## 2018-08-11 DIAGNOSIS — Z79899 Other long term (current) drug therapy: Secondary | ICD-10-CM | POA: Diagnosis not present

## 2018-08-11 DIAGNOSIS — I10 Essential (primary) hypertension: Secondary | ICD-10-CM | POA: Insufficient documentation

## 2018-08-11 DIAGNOSIS — G894 Chronic pain syndrome: Secondary | ICD-10-CM | POA: Diagnosis not present

## 2018-08-11 DIAGNOSIS — F329 Major depressive disorder, single episode, unspecified: Secondary | ICD-10-CM | POA: Diagnosis not present

## 2018-08-11 DIAGNOSIS — M792 Neuralgia and neuritis, unspecified: Secondary | ICD-10-CM

## 2018-08-11 DIAGNOSIS — F419 Anxiety disorder, unspecified: Secondary | ICD-10-CM | POA: Diagnosis not present

## 2018-08-11 DIAGNOSIS — F4312 Post-traumatic stress disorder, chronic: Secondary | ICD-10-CM | POA: Insufficient documentation

## 2018-08-11 DIAGNOSIS — M899 Disorder of bone, unspecified: Secondary | ICD-10-CM | POA: Diagnosis not present

## 2018-08-11 DIAGNOSIS — R1032 Left lower quadrant pain: Secondary | ICD-10-CM | POA: Diagnosis not present

## 2018-08-11 DIAGNOSIS — Z791 Long term (current) use of non-steroidal anti-inflammatories (NSAID): Secondary | ICD-10-CM | POA: Insufficient documentation

## 2018-08-11 DIAGNOSIS — G8929 Other chronic pain: Secondary | ICD-10-CM

## 2018-08-11 DIAGNOSIS — F314 Bipolar disorder, current episode depressed, severe, without psychotic features: Secondary | ICD-10-CM

## 2018-08-11 DIAGNOSIS — E162 Hypoglycemia, unspecified: Secondary | ICD-10-CM | POA: Insufficient documentation

## 2018-08-11 MED ORDER — TIZANIDINE HCL 4 MG PO TABS: 4.0000 mg | ORAL_TABLET | Freq: Once | ORAL | 2 refills | Status: AC | PRN

## 2018-08-11 MED ORDER — GABAPENTIN 600 MG PO TABS: ORAL_TABLET | ORAL | 3 refills | Status: DC

## 2018-08-11 NOTE — Progress Notes (Signed)
Safety precautions to be maintained throughout the outpatient stay will include: orient to surroundings, keep bed in low position, maintain call bell within reach at all times, provide assistance with transfer out of bed and ambulation.  

## 2018-08-11 NOTE — Progress Notes (Signed)
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 ROM                  Muscle Tone/Strength: Functionally intact. No obvious neuro-muscular anomalies detected.  Muscle Tone/Strength: Functionally intact. No obvious neuro-muscular anomalies detected.  Sensory (Neurological): Unimpaired        Sensory (Neurological): Unimpaired        DTR: Patellar: deferred today Achilles: deferred today Plantar: deferred today  DTR: Patellar: deferred today Achilles: deferred today Plantar: deferred today  Palpation: No palpable anomalies  Palpation: No palpable anomalies   Assessment & Plan  Primary Diagnosis & Pertinent Problem List: The primary encounter diagnosis was Chronic left-sided low back pain without sciatica. Diagnoses of Disorder of skeletal system, Chronic pain syndrome,  Abdominal pain, chronic, left lower quadrant (Primary Area of Pain), Neuropathic pain, Bipolar disorder, current episode depressed, severe, without psychotic features ([place]), and Chronic post-traumatic stress disorder (PTSD) were also pertinent to this visit.  Visit Diagnosis: 1. Chronic left-sided low back pain without sciatica   2. Disorder of skeletal system   3. Chronic pain syndrome   4. Abdominal pain, chronic, left lower quadrant (Primary Area of Pain)   5. Neuropathic pain   6. Bipolar disorder, current episode depressed, severe, without psychotic features ([place])   7. Chronic post-traumatic stress disorder (PTSD)   General Recommendations: The pain condition that the patient suffers from is best treated with a multidisciplinary approach that involves an increase in physical activity to prevent de-conditioning and worsening of the pain cycle, as well as psychological counseling (formal and/or informal) to address the co-morbid psychological affects of pain. Treatment will often involve judicious use of pain medications and interventional procedures to decrease the pain, allowing the patient to participate in the physical activity that will ultimately produce long-lasting pain reductions. The goal of the multidisciplinary approach is to return the patient to a higher level of overall function  and to restore their ability to perform activities of daily living.  47 year old female with a history of chronic persistent left lower quadrant pain status post multiple abdominal/pelvic surgeries including hysterectomy, bilateral oophorectomies.  Patient also has a history of gastric bypass surgery in 2014.  She has been told to avoid NSAIDs.  Patient's pain is primarily neuropathic in nature as she describes burning and tingling in a dermatomal fashion.  This is likely secondary to nerve damage from multiple operations.  Current dose of gabapentin is 600 mg 3 times a day.  Patient does not endorse any side  effects and states that the medication helps.  Recommend patient increase her nighttime dose to 1200 mg nightly and continue 600 mg every morning and 600 mg every afternoon.  Will be a total daily dose of 2400 mg.  Do not recommend any further dose escalation beyond this.  Patient also endorses intermittent spasms in her left lower quadrant brought up on spontaneously without any noticeable inciting event.  We discussed tizanidine that the patient can take as needed for when she has abdominal tightness/spasms.  We also discussed interventional therapies.  This would include left transverses abdominis plane block, left tap block under ultrasound guidance.  We will discuss this procedure further in the future.  Plan: -Non-opioid management only -Increase gabapentin as below -Prescription for tizanidine as below -Avoid NSAIDs given history of gastric bypass  Future considerations: -Left tap block under ultrasound guidance -Lidocaine infusion -Lyrica, Cymbalta  Plan of Care  Pharmacotherapy (Medications Ordered): Meds ordered this encounter  Medications  . gabapentin (NEURONTIN) 600 MG tablet    Sig: 600 mg qAM, 600 mg qPM, 1200 mg qhs    Dispense:  120 tablet    Refill:  3    Do not place this medication, or any other prescription from our practice, on "Automatic Refill". Patient may have prescription filled one day early if pharmacy is closed on scheduled refill date.  [name] tiZANidine (ZANAFLEX) 4 MG tablet    Sig: Take 1 tablet (4 mg total) by mouth once as needed for muscle spasms.    Dispense:  30 tablet    Refill:  2   Provider-requested follow-up: Return in about 6 weeks (around 09/22/2018).  Future Appointments  Date Time Provider [place]  09/21/2018  2:00 PM Gillis Santa, MD [facility] None    Primary Care Physician: Mikey College, NP Location: [facility] Note by: Gillis Santa, M.D Date: 08/11/2018; Time: 12:45 PM  Patient  Instructions  2 prescriptions sent to your pharmacy.   gabapentin (NEURONTIN) 600 MG tablet tiZANidine (ZANAFLEX) 4 MG tablet

## 2018-08-11 NOTE — Patient Instructions (Addendum)
2 prescriptions sent to your pharmacy.   gabapentin (NEURONTIN) 600 MG tablet tiZANidine (ZANAFLEX) 4 MG tablet

## 2018-09-12 ENCOUNTER — Other Ambulatory Visit: Payer: Self-pay | Admitting: Nurse Practitioner

## 2018-09-12 DIAGNOSIS — F314 Bipolar disorder, current episode depressed, severe, without psychotic features: Secondary | ICD-10-CM

## 2018-09-21 ENCOUNTER — Ambulatory Visit: Payer: Self-pay | Admitting: Student in an Organized Health Care Education/Training Program

## 2018-10-09 ENCOUNTER — Ambulatory Visit (INDEPENDENT_AMBULATORY_CARE_PROVIDER_SITE_OTHER): Payer: BC Managed Care – PPO | Admitting: Nurse Practitioner

## 2018-10-09 ENCOUNTER — Other Ambulatory Visit: Payer: Self-pay

## 2018-10-09 ENCOUNTER — Encounter: Payer: Self-pay | Admitting: Nurse Practitioner

## 2018-10-09 VITALS — BP 136/81 | HR 101 | Temp 97.8°F | Ht 65.0 in | Wt 177.9 lb

## 2018-10-09 DIAGNOSIS — I10 Essential (primary) hypertension: Secondary | ICD-10-CM | POA: Diagnosis not present

## 2018-10-09 MED ORDER — LISINOPRIL 10 MG PO TABS: 10.0000 mg | ORAL_TABLET | Freq: Every day | ORAL | 1 refills | Status: DC

## 2018-10-09 NOTE — Patient Instructions (Addendum)
Theresa [name],   Thank you for coming in to clinic today.  1. Resume lisinopril 10 mg once daily. - BP goal < 130/80. - If regularly in 100-110 SBP range, call clinic for consideration stopping lisinopril.  2. Return to clinic in about 2 weeks for labs (non-fasting).  Please schedule a follow-up appointment with Cassell Smiles, AGNP. Return in about 3 months (around 01/07/2019) for hypertension and labs 2 weeks.  If you have any other questions or concerns, please feel free to call the clinic or send a message through [place]. You may also schedule an earlier appointment if necessary.  You will receive a survey after today's visit either digitally by e-mail or paper by [employer]. Your experiences and feedback matter to [place].  Please respond so we know how we are doing as we provide care for you.   Cassell Smiles, DNP, AGNP-BC Adult Gerontology Nurse Practitioner [place]

## 2018-10-09 NOTE — Progress Notes (Signed)
Subjective:    Patient ID: Theresa [name], female    DOB: 04-21-1971, 48 y.o.   MRN: [id number]  Theresa [name] is a 48 y.o. female presenting on 10/09/2018 for Hypertension (migraine x 3 days.  Pt states she went to see her Psychiatrist and blood pressure 150/100. The pt contributes the elevated blood pressure to her situational stress.)   HPI Hypertension Patient presents today with reports of high blood pressure readings at psychiatry office.  She had hypertension in past and used lisinopril without side effects.  She has avoided coming to clinic secondary to increased stress being likely because of high blood pressure.  However, patient has had elevated readings over the last 6 months including at home.  2 days ago, patient had a migraine and blood pressure was 150/100.  She does not currently have headache today or any other symptoms of high blood pressure. - Patient endorses having intermittent dizziness, headache/ migraine, flushed face, mild eye pain over last 6 months.   - Pt denies chest tightness/pressure, palpitations, leg swelling, sudden loss of speech or loss of consciousness.  - No other symptoms.  Social History   Tobacco Use  . Smoking status: Never Smoker  . Smokeless tobacco: Never Used  Substance Use Topics  . Alcohol use: Yes    Comment: rare-occ. monthly  . Drug use: No    Review of Systems Per HPI unless specifically indicated above     Objective:    BP 136/81 (BP Location: Right Arm, Patient Position: Sitting, Cuff Size: Normal)   Pulse (!) 101   Temp 97.8 F (36.6 C) (Oral)   Ht 5\' 5"  (1.651 m)   Wt 177 lb 14.4 oz (80.7 kg)   LMP 10/24/2014 (Approximate)   BMI 29.60 kg/m   Wt Readings from Last 3 Encounters:  10/09/18 177 lb 14.4 oz (80.7 kg)  08/11/18 172 lb (78 kg)  07/27/18 170 lb (77.1 kg)    Physical Exam Vitals signs reviewed.  Constitutional:      General: She is awake. She is not in acute distress.    Appearance: She is  well-developed.  HENT:     Head: Normocephalic and atraumatic.  Neck:     Musculoskeletal: Normal range of motion and neck supple.     Vascular: No carotid bruit.  Cardiovascular:     Rate and Rhythm: Normal rate and regular rhythm.     Pulses:          Radial pulses are 2+ on the right side and 2+ on the left side.       Posterior tibial pulses are 1+ on the right side and 1+ on the left side.     Heart sounds: Normal heart sounds, S1 normal and S2 normal.  Pulmonary:     Effort: Pulmonary effort is normal. No respiratory distress.     Breath sounds: Normal breath sounds and air entry.  Skin:    General: Skin is warm and dry.     Capillary Refill: Capillary refill takes less than 2 seconds.  Neurological:     General: No focal deficit present.     Mental Status: She is alert and oriented to person, place, and time.     Cranial Nerves: No cranial nerve deficit.     Sensory: No sensory deficit.     Motor: No weakness.     Coordination: Coordination normal.     Gait: Gait normal.     Deep Tendon Reflexes: Reflexes normal.  Psychiatric:        Attention and Perception: Attention normal.        Mood and Affect: Mood and affect normal.        Behavior: Behavior normal. Behavior is cooperative.        Thought Content: Thought content normal.        Judgment: Judgment normal.    Results for orders placed or performed in visit on 07/27/18  Compliance Drug Analysis, Ur  Result Value Ref Range   Summary FINAL   Magnesium  Result Value Ref Range   Magnesium 2.1 1.6 - 2.3 mg/dL  Sedimentation rate  Result Value Ref Range   Sed Rate 2 0 - 32 mm/hr  C-reactive protein  Result Value Ref Range   CRP 2 0 - 10 mg/L      Assessment & Plan:   Problem List Items Addressed This Visit    None    Visit Diagnoses    Essential hypertension    -  Primary   Relevant Medications   lisinopril (PRINIVIL,ZESTRIL) 10 MG tablet   Other Relevant Orders   BASIC METABOLIC PANEL WITH GFR      Uncontrolled hypertension.  BP goal < 130/80.  Pt is working on lifestyle modifications for eating low salt diet.  She is not getting regular activity.  Has need for improved stress management skills.  She has not recently been on medications.  No currently identified complications.  Plan: 1. START lisinopril 10 mg once daily.  Discussed possible side effects of angioedema (rare), cough (common and reversible), kidney damage (rare, increase monitoring with start). 2. Obtain labs in 2 weeks.  3. Encouraged heart healthy diet and increasing exercise to 30 minutes most days of the week. 4. Check BP 1-2 x per week at home, keep log, and bring to clinic at next appointment. - Call clinic if SBP regularly 100-110 to consider med change. 5. Follow up 3 months.   If stable, can extend to 6 months follow-up.  Meds ordered this encounter  Medications  . lisinopril (PRINIVIL,ZESTRIL) 10 MG tablet    Sig: Take 1 tablet (10 mg total) by mouth daily.    Dispense:  90 tablet    Refill:  1    Order Specific Question:   Supervising Provider    Answer:   Olin Hauser [[id number]]    Follow up plan: Return in about 3 months (around 01/07/2019) for hypertension and labs 2 weeks.  Cassell Smiles, DNP, AGPCNP-BC Adult Gerontology Primary Care Nurse Practitioner [facility] 10/09/2018, 9:34 AM

## 2018-10-21 ENCOUNTER — Other Ambulatory Visit: Payer: BC Managed Care – PPO

## 2018-10-22 LAB — BASIC METABOLIC PANEL WITH GFR
BUN: 12 mg/dL (ref 7–25)
CO2: 28 mmol/L (ref 20–32)
Calcium: 9.2 mg/dL (ref 8.6–10.2)
Chloride: 104 mmol/L (ref 98–110)
Creat: 0.97 mg/dL (ref 0.50–1.10)
GFR, Est African American: 82 mL/min/{1.73_m2} (ref >=60)
GFR, Est Non African American: 71 mL/min/{1.73_m2} (ref >=60)
Glucose, Bld: 86 mg/dL (ref 65–99)
Potassium: 4.6 mmol/L (ref 3.5–5.3)
Sodium: 140 mmol/L (ref 135–146)

## 2018-10-30 ENCOUNTER — Other Ambulatory Visit: Payer: Self-pay | Admitting: Student in an Organized Health Care Education/Training Program

## 2018-10-31 ENCOUNTER — Other Ambulatory Visit: Payer: Self-pay | Admitting: Student in an Organized Health Care Education/Training Program

## 2018-12-03 ENCOUNTER — Telehealth: Payer: Self-pay | Admitting: Nurse Practitioner

## 2018-12-03 DIAGNOSIS — I10 Essential (primary) hypertension: Secondary | ICD-10-CM

## 2018-12-03 MED ORDER — LISINOPRIL 20 MG PO TABS: 20.0000 mg | ORAL_TABLET | Freq: Every day | ORAL | 1 refills | Status: DC

## 2018-12-03 NOTE — Telephone Encounter (Signed)
The pt was notified. No questions or concerns. 

## 2018-12-03 NOTE — Telephone Encounter (Signed)
Pt's BP is 160/94, resting heart rate is 110.  Asked about increasing lisinopril [phone]

## 2018-12-03 NOTE — Telephone Encounter (Signed)
We can increase her lisinopril to 20 mg once daily.  Will need to keep follow-up appointment at end of April.

## 2018-12-08 ENCOUNTER — Other Ambulatory Visit: Payer: Self-pay | Admitting: Nurse Practitioner

## 2018-12-08 DIAGNOSIS — F314 Bipolar disorder, current episode depressed, severe, without psychotic features: Secondary | ICD-10-CM

## 2019-01-08 ENCOUNTER — Ambulatory Visit: Payer: BC Managed Care – PPO | Admitting: Nurse Practitioner

## 2019-01-08 ENCOUNTER — Encounter: Payer: Self-pay | Admitting: Family Medicine

## 2019-01-08 ENCOUNTER — Other Ambulatory Visit: Payer: Self-pay

## 2019-01-08 ENCOUNTER — Ambulatory Visit (INDEPENDENT_AMBULATORY_CARE_PROVIDER_SITE_OTHER): Payer: BC Managed Care – PPO | Admitting: Family Medicine

## 2019-01-08 VITALS — BP 138/92 | HR 110 | Ht 65.0 in | Wt 177.0 lb

## 2019-01-08 DIAGNOSIS — Z86018 Personal history of other benign neoplasm: Secondary | ICD-10-CM | POA: Diagnosis not present

## 2019-01-08 DIAGNOSIS — E2839 Other primary ovarian failure: Secondary | ICD-10-CM

## 2019-01-08 DIAGNOSIS — I10 Essential (primary) hypertension: Secondary | ICD-10-CM | POA: Diagnosis not present

## 2019-01-08 MED ORDER — METOPROLOL TARTRATE 25 MG PO TABS: 25.0000 mg | ORAL_TABLET | Freq: Two times a day (BID) | ORAL | 1 refills | Status: DC

## 2019-01-08 MED ORDER — ESTRADIOL 1 MG PO TABS: 1.0000 mg | ORAL_TABLET | Freq: Two times a day (BID) | ORAL | 1 refills | Status: DC

## 2019-01-08 NOTE — Assessment & Plan Note (Addendum)
Mildly elevated BP still, but IMPROVED on lisinopril, still has elevated HR - Home BP readings 130/90, HR >100 consistently     Plan:  1. ADD NEW - Metoprolol tartrate 25mg  BID - for BP and HR 2. Continue Lisinopril 10mg  daily 3. Encourage improved lifestyle - low sodium diet, regular exercise - recommended improve 4. Continue monitor BP outside office, bring readings to next visit, if persistently >140/90 or new symptoms notify office sooner 5. Follow-up sooner if need, otherwise within 3 month with PCP  Discussed may warrant further adrenal imaging in future to completely rule out any issues or recurrence, for now trial beta blocker

## 2019-01-08 NOTE — Patient Instructions (Addendum)
Refilled estrogen today for 3 month  Add new BP medication - Metoprolol tartrate 25mg  - take 1 pill twice a day along with Lisinopril still as instructed.  Keep improving lifestyle, diet, hydration low salt, exercise cardio with walking and bike to help improve heart rate and BP  Please discuss Adrenal issue with Lauren at next visit, we may need more imaging to investigate this if your heart rate is still high.  Please schedule a Follow-up Appointment to: Return in about 3 months (around 04/10/2019) for HTN.  If you have any other questions or concerns, please feel free to call the office or send a message through [place]. You may also schedule an earlier appointment if necessary.  Additionally, you may be receiving a survey about your experience at our office within a few days to 1 week by e-mail or mail. We value your feedback.  Nobie Putnam, DO [place]

## 2019-01-08 NOTE — Assessment & Plan Note (Signed)
Chronic problem, post surgical estrogen deficiency Managed by GYN previously for hormone therapy, recently by PCP in interval  Plan - Will refill now and defer to PCP within next 3-6 months on her return

## 2019-01-08 NOTE — Progress Notes (Signed)
Subjective:    Patient ID: Theresa [name], female    DOB: 10-09-1970, 48 y.o.   MRN: [id number]  Theresa [name] is a 48 y.o. female presenting on 01/08/2019 for Hypertension (B/P 138/92 pulse 110 )  Virtual / Telehealth Encounter - Video Visit via [email or web address] The purpose of this virtual visit is to provide medical care while limiting exposure to the novel coronavirus (COVID19) for both patient and office staff.  Consent was obtained for remote visit:  Yes.   Answered questions that patient had about telehealth interaction:  Yes.   I discussed the limitations, risks, security and privacy concerns of performing an evaluation and management service by video/telephone. I also discussed with the patient that there may be a patient responsible charge related to this service. The patient expressed understanding and agreed to proceed.  Patient Location: Home Provider Location: [facility] (Office)  PCP is Cassell Smiles, AGPCNP-BC - I am currently covering during her maternity leave.  HPI   FOLLOW-UP CHRONIC HTN: - Last visit with PCP 10/09/18, for same problem, treated with restarted ACEi with Lisinopril - had been off of in past had elevated readings up to 150/100, see prior notes for background information. - Interval update with improved BP but still not at goal, and elevated HR persistently now - her symptoms previously of dizziness and headache flushing have RESOLVED  - Today patient reports BP readings avg recently 135-140 / 90s on avg and HR 100-110 avg, her husband is paramedic and has checked her BP regularly Current Meds - Lisinopril 70m daily   Reports good compliance, took meds today. Tolerating well, w/o complaints. Lifestyle: - Diet: low salt diet, otherwise no particular diet - Exercise: trying to improve, has not fully restarted yet, plan to do more walking and bike Denies CP, dyspnea, HA, edema, dizziness / lightheadedness  History of Adrenal Tumor, s/p  resection Reports remote history >15 years ago with adrenal tumor, do not have this record, she says this was removed and told to be treated completely. At that time she recalls she was on Labetalol.  Estrogen Deficiency Managed by PCP. She takes Estradiol, due for refill. She had seen GYN in past.  [facility] 01/08/2019 08/11/2018 04/10/2018  Decreased Interest 0 0 0  Down, Depressed, Hopeless 0 0 0  PHQ - 2 Score 0 0 0  Altered sleeping - - 0  Tired, decreased energy - - 0  Change in appetite - - 0  Feeling bad or failure about yourself  - - 0  Trouble concentrating - - 0  Moving slowly or fidgety/restless - - 0  Suicidal thoughts - - 0  PHQ-9 Score - - 0  Difficult doing work/chores - - Not difficult at all    Social History   Tobacco Use  . Smoking status: Never Smoker  . Smokeless tobacco: Never Used  Substance Use Topics  . Alcohol use: Yes    Comment: rare-occ. monthly  . Drug use: No    Review of Systems Per HPI unless specifically indicated above     Objective:    BP (!) 138/92   Pulse (!) 110   Ht 5' 5"  (1.651 m)   Wt 177 lb (80.3 kg)   LMP 10/24/2014 (Approximate)   BMI 29.45 kg/m   Wt Readings from Last 3 Encounters:  01/08/19 177 lb (80.3 kg)  10/09/18 177 lb 14.4 oz (80.7 kg)  08/11/18 172 lb (78 kg)    Physical Exam  Note examination was completely remotely via video observation objective data only  Gen - well-appearing, no acute distress or apparent pain, comfortable HEENT - eyes appear clear without discharge or redness Heart/Lungs - cannot examine virtually - observed no evidence of coughing or labored breathing. Skin - face visible today- no rash Neuro - awake, alert, oriented Psych - not anxious appearing, good mood, normal speech and thoughts   Results for orders placed or performed in visit on 24/58/09  BASIC METABOLIC PANEL WITH GFR  Result Value Ref Range   Glucose, Bld 86 65 - 99 mg/dL   BUN 12 7 - 25 mg/dL   Creat  0.97 0.50 - 1.10 mg/dL   GFR, Est Non African American 71 > OR = 60 mL/min/1.33m   GFR, Est African American 82 > OR = 60 mL/min/1.772m  BUN/Creatinine Ratio NOT APPLICABLE 6 - 22 (calc)   Sodium 140 135 - 146 mmol/L   Potassium 4.6 3.5 - 5.3 mmol/L   Chloride 104 98 - 110 mmol/L   CO2 28 20 - 32 mmol/L   Calcium 9.2 8.6 - 10.2 mg/dL      Assessment & Plan:   Problem List Items Addressed This Visit    Essential hypertension - Primary    Mildly elevated BP still, but IMPROVED on lisinopril, still has elevated HR - Home BP readings 130/90, HR >100 consistently     Plan:  1. ADD NEW - Metoprolol tartrate 2583mID - for BP and HR 2. Continue Lisinopril 41m23mily 3. Encourage improved lifestyle - low sodium diet, regular exercise - recommended improve 4. Continue monitor BP outside office, bring readings to next visit, if persistently >140/90 or new symptoms notify office sooner 5. Follow-up sooner if need, otherwise within 3 month with PCP  Discussed may warrant further adrenal imaging in future to completely rule out any issues or recurrence, for now trial beta blocker      Relevant Medications   metoprolol tartrate (LOPRESSOR) 25 MG tablet   Estrogen deficiency    Chronic problem, post surgical estrogen deficiency Managed by GYN previously for hormone therapy, recently by PCP in interval  Plan - Will refill now and defer to PCP within next 3-6 months on her return      Relevant Medications   estradiol (ESTRACE) 1 MG tablet   History of benign adrenal tumor    Remote history 15 yr ago, surgical removal  Question if could be related to her BP - seems unlikely for recurrence given this report but - would recommend repeat imaging in future.         Meds ordered this encounter  Medications  . metoprolol tartrate (LOPRESSOR) 25 MG tablet    Sig: Take 1 tablet (25 mg total) by mouth 2 (two) times daily.    Dispense:  180 tablet    Refill:  1  . estradiol (ESTRACE) 1  MG tablet    Sig: Take 1 tablet (1 mg total) by mouth 2 (two) times daily.    Dispense:  180 tablet    Refill:  1    Follow-up: - Return in 3 months for HTN w/ PCP  Patient verbalizes understanding with the above medical recommendations including the limitation of remote medical advice.  Specific follow-up and call-back criteria were given for patient to follow-up or seek medical care more urgently if needed.  Total duration of direct patient care provided via video conference: 25 minutes   [facility]Nobie Putnam [facility]  [facility] 01/08/2019, 9:34 AM

## 2019-01-08 NOTE — Assessment & Plan Note (Signed)
Remote history 15 yr ago, surgical removal  Question if could be related to her BP - seems unlikely for recurrence given this report but - would recommend repeat imaging in future.

## 2019-03-25 ENCOUNTER — Other Ambulatory Visit: Payer: Self-pay | Admitting: Nurse Practitioner

## 2019-03-25 DIAGNOSIS — F314 Bipolar disorder, current episode depressed, severe, without psychotic features: Secondary | ICD-10-CM

## 2019-04-12 ENCOUNTER — Other Ambulatory Visit: Payer: Self-pay

## 2019-04-12 ENCOUNTER — Encounter: Payer: Self-pay | Admitting: Nurse Practitioner

## 2019-04-12 ENCOUNTER — Ambulatory Visit (INDEPENDENT_AMBULATORY_CARE_PROVIDER_SITE_OTHER): Payer: BC Managed Care – PPO | Admitting: Nurse Practitioner

## 2019-04-12 VITALS — BP 115/69 | HR 63 | Ht 65.0 in | Wt 178.6 lb

## 2019-04-12 DIAGNOSIS — E2839 Other primary ovarian failure: Secondary | ICD-10-CM | POA: Diagnosis not present

## 2019-04-12 DIAGNOSIS — R1030 Lower abdominal pain, unspecified: Secondary | ICD-10-CM

## 2019-04-12 DIAGNOSIS — F314 Bipolar disorder, current episode depressed, severe, without psychotic features: Secondary | ICD-10-CM | POA: Diagnosis not present

## 2019-04-12 DIAGNOSIS — R1032 Left lower quadrant pain: Secondary | ICD-10-CM | POA: Diagnosis not present

## 2019-04-12 DIAGNOSIS — F5104 Psychophysiologic insomnia: Secondary | ICD-10-CM

## 2019-04-12 DIAGNOSIS — G8929 Other chronic pain: Secondary | ICD-10-CM

## 2019-04-12 DIAGNOSIS — I10 Essential (primary) hypertension: Secondary | ICD-10-CM

## 2019-04-12 MED ORDER — GABAPENTIN 600 MG PO TABS: ORAL_TABLET | ORAL | 3 refills | Status: DC

## 2019-04-12 MED ORDER — METOPROLOL SUCCINATE ER 25 MG PO TB24: 25.0000 mg | ORAL_TABLET | Freq: Every day | ORAL | 3 refills | Status: DC

## 2019-04-12 MED ORDER — BUPROPION HCL ER (SR) 150 MG PO TB12: 150.0000 mg | ORAL_TABLET | Freq: Two times a day (BID) | ORAL | 1 refills | Status: DC

## 2019-04-12 MED ORDER — BELSOMRA 15 MG PO TABS: 15.0000 mg | ORAL_TABLET | Freq: Every evening | ORAL | 1 refills | Status: DC | PRN

## 2019-04-12 MED ORDER — LISINOPRIL 20 MG PO TABS: 20.0000 mg | ORAL_TABLET | Freq: Every day | ORAL | 1 refills | Status: DC

## 2019-04-12 MED ORDER — ESTRADIOL 1 MG PO TABS: 1.0000 mg | ORAL_TABLET | Freq: Every day | ORAL | 1 refills | Status: DC

## 2019-04-12 NOTE — Progress Notes (Signed)
Subjective:    Patient ID: Theresa [name], female    DOB: 08-Jun-1971, 48 y.o.   MRN: [id number]  Theresa [name] is a 48 y.o. female presenting on 04/12/2019 for Hypertension  HPI Hypertension  - She is checking BP at home or outside of clinic.  Readings 110-120/70-80. Resting HR is also around 60-72. - Current medications: metoprolol tartrate 25 mg (taking only once daily), lisinopril 20 mg once daily, tolerating well without side effects - She is not currently symptomatic. - Pt denies headache, lightheadedness, dizziness, changes in vision, chest tightness/pressure, palpitations, leg swelling, sudden loss of speech or loss of consciousness. - She  reports no regular exercise routine. - Her diet is moderate in salt, moderate in fat, and moderate in carbohydrates.   Anxiety and Depression primary mgt at [facility] - is taking xanax 0.5 mg occasionally from an old prescription.   Uses this one dose only every 2-3 days.  Patient has disclosed to them she has restarted her xanax. Father-in-law passed away since last visit.  Has had more difficulty with moods, but is remaining stable overall.  No SI/HI.  Insomnia - patient was taking Belsomra in past for about 2 months.  Then stopped and has had normal sleep pattern since.  Disrupted sleep pattern about 1 month ago once her father-in-law died.  She had been getting up to take care of him every 2 hours.  Patient has pan cycle sleep disturbance.  Has eliminated caffeine, increased water.  Phone away from bedside.  Going to bed earlier and consistent time.  None have helped. - Patient has used melatonin for the past couple weeks, which helps her fall asleep but not stay asleep.  Unisom no improvement.   Chronic lower quadrant abdominal pain Patient notes pain is fully resolved.  She is no longer seeing Dr. Holley Raring.  Gabapentin dose is sufficient for pain control.  Social History   Tobacco Use  . Smoking status: Never Smoker  .  Smokeless tobacco: Never Used  Substance Use Topics  . Alcohol use: Yes    Comment: rare-occ. monthly  . Drug use: No    Review of Systems Per HPI unless specifically indicated above     Objective:    BP 115/69 (BP Location: Right Arm, Patient Position: Sitting, Cuff Size: Normal)   Pulse 63   Ht 5\' 5"  (1.651 m)   Wt 178 lb 9.6 oz (81 kg)   LMP 10/24/2014 (Approximate)   BMI 29.72 kg/m   Wt Readings from Last 3 Encounters:  04/12/19 178 lb 9.6 oz (81 kg)  01/08/19 177 lb (80.3 kg)  10/09/18 177 lb 14.4 oz (80.7 kg)    Physical Exam Vitals signs reviewed.  Constitutional:      General: She is not in acute distress.    Appearance: She is well-developed.  HENT:     Head: Normocephalic and atraumatic.  Cardiovascular:     Rate and Rhythm: Normal rate and regular rhythm.     Pulses:          Radial pulses are 2+ on the right side and 2+ on the left side.       Posterior tibial pulses are 1+ on the right side and 1+ on the left side.     Heart sounds: Normal heart sounds, S1 normal and S2 normal.  Pulmonary:     Effort: Pulmonary effort is normal. No respiratory distress.     Breath sounds: Normal breath sounds and air entry.  Musculoskeletal:     Right lower leg: No edema.     Left lower leg: No edema.  Skin:    General: Skin is warm and dry.     Capillary Refill: Capillary refill takes less than 2 seconds.  Neurological:     General: No focal deficit present.     Mental Status: She is alert and oriented to person, place, and time. Mental status is at baseline.  Psychiatric:        Attention and Perception: Attention normal.        Mood and Affect: Mood and affect normal.        Behavior: Behavior normal. Behavior is cooperative.        Thought Content: Thought content normal.        Judgment: Judgment normal.     Results for orders placed or performed in visit on 16/10/96  BASIC METABOLIC PANEL WITH GFR  Result Value Ref Range   Glucose, Bld 86 65 - 99 mg/dL    BUN 12 7 - 25 mg/dL   Creat 0.97 0.50 - 1.10 mg/dL   GFR, Est Non African American 71 > OR = 60 mL/min/1.61m2   GFR, Est African American 82 > OR = 60 mL/min/1.21m2   BUN/Creatinine Ratio NOT APPLICABLE 6 - 22 (calc)   Sodium 140 135 - 146 mmol/L   Potassium 4.6 3.5 - 5.3 mmol/L   Chloride 104 98 - 110 mmol/L   CO2 28 20 - 32 mmol/L   Calcium 9.2 8.6 - 10.2 mg/dL      Assessment & Plan:   Problem List Items Addressed This Visit      Cardiovascular and Mediastinum   Essential hypertension Controlled hypertension.  BP goal < 130/80.  Pt is beginning to work on lifestyle modifications again.  Taking medications tolerating well without side effects. No current complications.  Plan: 1. Continue taking medications  - Change to metoprolol succinate for 24 hr coverage.  Patient only taking metoprolol tartrate once daily in past. 2. Obtain labs   3. Encouraged heart healthy diet and increasing exercise to 30 minutes most days of the week. 4. Check BP 1-2 x per week at home, keep log, and bring to clinic at next appointment. 5. Follow up 6 months.    Relevant Medications   lisinopril (ZESTRIL) 20 MG tablet   metoprolol succinate (TOPROL-XL) 25 MG 24 hr tablet     Other   Abdominal pain, chronic, left lower quadrant (Primary Area of Pain) (Chronic) Stable and resolved at this time on medications.  Continue current medications.  Refills provided.  Follow-up 6 months.   Relevant Medications   buPROPion (WELLBUTRIN SR) 150 MG 12 hr tablet   gabapentin (NEURONTIN) 600 MG tablet   Bipolar affect, depressed (HCC) Stable.  Primary med adjust with psychiatry.  Patient needs refills today and are  Provided.   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  Continue estradiol until at least age 17 when we will discuss  options for tapering.  Refills provided.  Follow-up 1 year.   Relevant Medications   estradiol (ESTRACE) 1 MG tablet    Other Visit Diagnoses    Psychophysiological insomnia    -  Primary Recurrence of insomnia that had previously been controlled. 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  Prior medication that was effective was Belsomra 15 mg qhs.  Lower dose not previously effective.  Take for next 2-3 months, focus on sleep hygiene, then try to taper off med and take only prn.  FOLLOW-UP 3 months prn if needing to continue med.   Relevant Medications   Suvorexant (BELSOMRA) 15 MG TABS      Meds ordered this encounter  Medications  . Suvorexant (BELSOMRA) 15 MG TABS    Sig: Take 15 mg by mouth at bedtime as needed.    Dispense:  30 tablet    Refill:  1    Order Specific Question:   Supervising Provider    Answer:   Olin Hauser [[id number]]  . buPROPion (WELLBUTRIN SR) 150 MG 12 hr tablet    Sig: Take 1 tablet (150 mg total) by mouth 2 (two) times daily.    Dispense:  180 tablet    Refill:  1    Order Specific Question:   Supervising Provider    Answer:   Olin Hauser [[id number]]  . estradiol (ESTRACE) 1 MG tablet    Sig: Take 1 tablet (1 mg total) by mouth daily.    Dispense:  90 tablet    Refill:  1    Order Specific Question:   Supervising Provider    Answer:   Olin Hauser [[id number]]  . gabapentin (NEURONTIN) 600 MG tablet    Sig: 600 mg qAM, 600 mg qPM, 1200 mg qhs    Dispense:  120 tablet    Refill:  3    Do not place this medication, or any other prescription from our practice, on "Automatic Refill". Patient may have prescription filled one day early if pharmacy is closed on scheduled refill date.    Order Specific Question:   Supervising Provider    Answer:   Olin Hauser [[id number]]  . lisinopril (ZESTRIL) 20 MG tablet    Sig: Take 1 tablet (20 mg total) by mouth daily.    Dispense:  90 tablet     Refill:  1    Order Specific Question:   Supervising Provider    Answer:   Olin Hauser [[id number]]  . metoprolol succinate (TOPROL-XL) 25 MG 24 hr tablet    Sig: Take 1 tablet (25 mg total) by mouth daily.    Dispense:  90 tablet    Refill:  3    Order Specific Question:   Supervising Provider    Answer:   Olin Hauser [[id number]]   Follow up plan: Return in about 6 months (around 10/13/2019) for hypertension, insomnia AND sooner if needed.  Cassell Smiles, DNP, AGPCNP-BC Adult Gerontology Primary Care Nurse Practitioner [facility] 04/12/2019, 8:20 AM

## 2019-04-12 NOTE — Patient Instructions (Addendum)
Theresa [name],   Thank you for coming in to clinic today.  1. Restart belsomra 15 mg one tablet as needed at bedtime for insomnia.  2. Avoid multiple sedating medications at bedtime.    3. Change your metoprolol to metoprolol succinate 25 mg once daily (24 hr tablet)  4. Continue lisinopril.  Please schedule a follow-up appointment with Cassell Smiles, AGNP. Return in about 6 months (around 10/13/2019) for hypertension, insomnia AND sooner if needed.  If you have any other questions or concerns, please feel free to call the clinic or send a message through [place]. You may also schedule an earlier appointment if necessary.  You will receive a survey after today's visit either digitally by e-mail or paper by [employer]. Your experiences and feedback matter to [place].  Please respond so we know how we are doing as we provide care for you.   Cassell Smiles, DNP, AGNP-BC Adult Gerontology Nurse Practitioner [facility], [facility]   Sleep Hygiene Tips  Take medicines only as directed by your health care provider.  Keep regular sleeping and waking hours. Avoid naps.  Keep a sleep diary to help you and your health care provider figure out what could be causing your insomnia. Include:  When you sleep.  When you wake up during the night.  How well you sleep.  How rested you feel the next day.  Any side effects of medicines you are taking.  What you eat and drink.  Make your bedroom a comfortable place where it is easy to fall asleep:  Put up shades or special blackout curtains to block light from outside.  Use a white noise machine to block noise.  Keep the temperature cool.  Exercise regularly as directed by your health care provider. Avoid exercising right before bedtime.  Use relaxation techniques to manage stress. Ask your health care provider to suggest some techniques that may work well for you. These may include:  Breathing exercises.  Routines to  release muscle tension.  Visualizing peaceful scenes.  Cut back on alcohol, caffeinated beverages, and cigarettes, especially close to bedtime. These can disrupt your sleep.  Do not overeat or eat spicy foods right before bedtime. This can lead to digestive discomfort that can make it hard for you to sleep.  Limit screen use before bedtime. This includes:  Watching TV.  Using your smartphone, tablet, and computer.  Stick to a routine. This can help you fall asleep faster. Try to do a quiet activity, brush your teeth, and go to bed at the same time each night.  Get out of bed if you are still awake after 15 minutes of trying to sleep. Keep the lights down, but try reading or doing a quiet activity. When you feel sleepy, go back to bed.  Make sure that you drive carefully. Avoid driving if you feel very sleepy.  Keep all follow-up appointments as directed by your health care provider. This is important.

## 2019-04-14 ENCOUNTER — Encounter: Payer: Self-pay | Admitting: Nurse Practitioner

## 2019-04-27 ENCOUNTER — Other Ambulatory Visit: Payer: Self-pay | Admitting: Nurse Practitioner

## 2019-04-27 DIAGNOSIS — J00 Acute nasopharyngitis [common cold]: Secondary | ICD-10-CM

## 2019-06-21 ENCOUNTER — Other Ambulatory Visit: Payer: Self-pay | Admitting: Family Medicine

## 2019-06-21 DIAGNOSIS — F314 Bipolar disorder, current episode depressed, severe, without psychotic features: Secondary | ICD-10-CM

## 2019-06-25 ENCOUNTER — Other Ambulatory Visit: Payer: Self-pay | Admitting: Family Medicine

## 2019-06-25 DIAGNOSIS — I10 Essential (primary) hypertension: Secondary | ICD-10-CM

## 2019-07-06 ENCOUNTER — Other Ambulatory Visit: Payer: Self-pay | Admitting: Family Medicine

## 2019-07-06 DIAGNOSIS — E2839 Other primary ovarian failure: Secondary | ICD-10-CM

## 2019-08-28 ENCOUNTER — Other Ambulatory Visit: Payer: Self-pay | Admitting: Nurse Practitioner

## 2019-08-28 DIAGNOSIS — G8929 Other chronic pain: Secondary | ICD-10-CM

## 2019-08-28 DIAGNOSIS — R1032 Left lower quadrant pain: Secondary | ICD-10-CM

## 2019-10-03 ENCOUNTER — Other Ambulatory Visit: Payer: Self-pay | Admitting: Family Medicine

## 2019-10-03 DIAGNOSIS — E2839 Other primary ovarian failure: Secondary | ICD-10-CM

## 2019-10-13 ENCOUNTER — Ambulatory Visit: Payer: BC Managed Care – PPO | Admitting: Nurse Practitioner

## 2019-10-28 ENCOUNTER — Ambulatory Visit: Payer: BC Managed Care – PPO | Admitting: Family Medicine

## 2019-11-09 ENCOUNTER — Encounter: Payer: Self-pay | Admitting: Family Medicine

## 2019-11-09 ENCOUNTER — Ambulatory Visit (INDEPENDENT_AMBULATORY_CARE_PROVIDER_SITE_OTHER): Payer: BC Managed Care – PPO | Admitting: Family Medicine

## 2019-11-09 ENCOUNTER — Other Ambulatory Visit: Payer: Self-pay

## 2019-11-09 ENCOUNTER — Other Ambulatory Visit: Payer: Self-pay | Admitting: Nurse Practitioner

## 2019-11-09 VITALS — BP 124/79 | HR 77 | Temp 97.8°F | Ht 65.0 in | Wt 171.4 lb

## 2019-11-09 DIAGNOSIS — E2839 Other primary ovarian failure: Secondary | ICD-10-CM | POA: Diagnosis not present

## 2019-11-09 DIAGNOSIS — I10 Essential (primary) hypertension: Secondary | ICD-10-CM

## 2019-11-09 DIAGNOSIS — F5104 Psychophysiologic insomnia: Secondary | ICD-10-CM

## 2019-11-09 DIAGNOSIS — E785 Hyperlipidemia, unspecified: Secondary | ICD-10-CM | POA: Diagnosis not present

## 2019-11-09 DIAGNOSIS — Z23 Encounter for immunization: Secondary | ICD-10-CM

## 2019-11-09 DIAGNOSIS — G4700 Insomnia, unspecified: Secondary | ICD-10-CM | POA: Insufficient documentation

## 2019-11-09 MED ORDER — BELSOMRA 15 MG PO TABS: 15.0000 mg | ORAL_TABLET | Freq: Every evening | ORAL | 1 refills | Status: DC | PRN

## 2019-11-09 MED ORDER — ESTRADIOL 1 MG PO TABS: 1.0000 mg | ORAL_TABLET | Freq: Two times a day (BID) | ORAL | 0 refills | Status: DC

## 2019-11-09 NOTE — Assessment & Plan Note (Signed)
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  Previously managed by GYN for hormone therapy, will continue at this time as tolerating well.  Low risk for complications and will discuss titration off of medications as approaches closer to 57 or any change in health concerns that may increase risk for complications.

## 2019-11-09 NOTE — Assessment & Plan Note (Signed)
Stable and well controlled on current medication regimen.  Currently taking lisinopril 20mg  daily and metoprolol 25mg  daily and tolerating it well.   Plan: 1) Labs to be drawn today 2) Continue lisinopril 20mg  daily and metoprolol 25mg  daily  3) Take blood pressure readings regularly and write in a log.  Bring that log to your next appointment. 4) Heart healthy diet and to exercise every other day for 30 minutes per day, going no more than 2 days in a row without exercise. 5) We will see you back in 6 months

## 2019-11-09 NOTE — Assessment & Plan Note (Signed)
Unknown control, last lab levels 04/10/2018 show lipids well controlled.  Has not been on medications.  Will have labs drawn today to assess levels.

## 2019-11-09 NOTE — Assessment & Plan Note (Signed)
Currently tolerating Belsomra and is sleeping well when takes the medication, awaking well rested, and feels is tolerating well without side effect.  Will continue this medication at current dosage.

## 2019-11-09 NOTE — Progress Notes (Signed)
Subjective:    Patient ID: Theresa [name], female    DOB: 10/26/1970, 49 y.o.   MRN: [id number]  Theresa [name] is a 49 y.o. female presenting on 11/09/2019 for Insomnia and Hypertension   HPI  Theresa [name] presents to clinic for follow up on her hypertension and on her insomnia.  Is currently taking all lisinopril 41m daily, metoprolol 235mdaily for hypertension and belsomra 1534mRN at bedtime for insomnia and tolerating all well.  Denies any headaches, dizziness, visual changes, lightheadedness, chest pain, SOB, abdominal pain, n/v/d, leg swelling, numbness, tingling, weakness.  [facility] 11/09/2019 04/12/2019 01/08/2019  Decreased Interest 0 0 0  Down, Depressed, Hopeless 0 0 0  PHQ - 2 Score 0 0 0  Altered sleeping - 3 -  Tired, decreased energy - 0 -  Change in appetite - 0 -  Feeling bad or failure about yourself  - 0 -  Trouble concentrating - 0 -  Moving slowly or fidgety/restless - 0 -  Suicidal thoughts - 0 -  PHQ-9 Score - 3 -  Difficult doing work/chores - Not difficult at all -    Social History   Tobacco Use  . Smoking status: Never Smoker  . Smokeless tobacco: Never Used  Substance Use Topics  . Alcohol use: Yes    Comment: rare-occ. monthly  . Drug use: No    Review of Systems  Constitutional: Negative.   HENT: Negative.   Eyes: Negative.   Respiratory: Negative.   Cardiovascular: Negative.   Gastrointestinal: Negative.   Endocrine: Negative.   Genitourinary: Negative.   Musculoskeletal: Negative.   Skin: Negative.   Allergic/Immunologic: Negative.   Neurological: Negative.   Hematological: Negative.   Psychiatric/Behavioral: Negative.    Per HPI unless specifically indicated above     Objective:    BP 124/79 (BP Location: Right Arm, Patient Position: Sitting, Cuff Size: Normal)   Pulse 77   Temp 97.8 F (36.6 C) (Temporal)   Ht 5' 5"  (1.651 m)   Wt 171 lb 6.4 oz (77.7 kg)   LMP 10/24/2014 (Approximate)   BMI 28.52  kg/m   Wt Readings from Last 3 Encounters:  11/09/19 171 lb 6.4 oz (77.7 kg)  04/12/19 178 lb 9.6 oz (81 kg)  01/08/19 177 lb (80.3 kg)    Physical Exam Vitals reviewed.  Constitutional:      General: She is not in acute distress.    Appearance: Normal appearance. She is well-groomed and overweight. She is not ill-appearing or toxic-appearing.  HENT:     Head: Normocephalic.  Eyes:     General: Lids are normal. Vision grossly intact.        Right eye: No discharge.        Left eye: No discharge.     Extraocular Movements: Extraocular movements intact.     Conjunctiva/sclera: Conjunctivae normal.     Pupils: Pupils are equal, round, and reactive to light.  Cardiovascular:     Rate and Rhythm: Normal rate and regular rhythm.     Pulses: Normal pulses.          Dorsalis pedis pulses are 2+ on the right side and 2+ on the left side.       Posterior tibial pulses are 2+ on the right side and 2+ on the left side.     Heart sounds: Normal heart sounds. No murmur. No friction rub. No gallop.   Pulmonary:     Effort: Pulmonary effort is  normal. No respiratory distress.     Breath sounds: Normal breath sounds.  Abdominal:     General: Abdomen is flat. Bowel sounds are normal. There is no distension.     Palpations: Abdomen is soft. There is no hepatomegaly, splenomegaly or mass.     Tenderness: There is abdominal tenderness (Chronic abdominal tenderness, unchanged from baseline) in the left lower quadrant. There is no guarding or rebound.     Hernia: No hernia is present.     Comments: Chronic abdominal tenderness, per patient at baseline  Musculoskeletal:        General: Normal range of motion.     Cervical back: Normal range of motion.     Right lower leg: No edema.     Left lower leg: No edema.  Feet:     Right foot:     Skin integrity: Skin integrity normal.     Left foot:     Skin integrity: Skin integrity normal.  Skin:    General: Skin is warm and dry.     Capillary  Refill: Capillary refill takes less than 2 seconds.  Neurological:     General: No focal deficit present.     Mental Status: She is alert and oriented to person, place, and time.     Cranial Nerves: Cranial nerves are intact.     Sensory: Sensation is intact.     Motor: Motor function is intact.     Coordination: Coordination normal.     Gait: Gait normal.  Psychiatric:        Attention and Perception: Attention and perception normal.        Mood and Affect: Mood and affect normal.        Speech: Speech normal.        Behavior: Behavior normal. Behavior is cooperative.        Thought Content: Thought content normal.        Cognition and Memory: Cognition and memory normal.        Judgment: Judgment normal.     Results for orders placed or performed in visit on 24/58/09  BASIC METABOLIC PANEL WITH GFR  Result Value Ref Range   Glucose, Bld 86 65 - 99 mg/dL   BUN 12 7 - 25 mg/dL   Creat 0.97 0.50 - 1.10 mg/dL   GFR, Est Non African American 71 > OR = 60 mL/min/1.83m   GFR, Est African American 82 > OR = 60 mL/min/1.721m  BUN/Creatinine Ratio NOT APPLICABLE 6 - 22 (calc)   Sodium 140 135 - 146 mmol/L   Potassium 4.6 3.5 - 5.3 mmol/L   Chloride 104 98 - 110 mmol/L   CO2 28 20 - 32 mmol/L   Calcium 9.2 8.6 - 10.2 mg/dL      Assessment & Plan:   Problem List Items Addressed This Visit      Cardiovascular and Mediastinum   Essential hypertension    Stable and well controlled on current medication regimen.  Currently taking lisinopril 2053maily and metoprolol 82m71mily and tolerating it well.   Plan: 1) Labs to be drawn today 2) Continue lisinopril 20mg44mly and metoprolol 82mg 48my  3) Take blood pressure readings regularly and write in a log.  Bring that log to your next appointment. 4) Heart healthy diet and to exercise every other day for 30 minutes per day, going no more than 2 days in a row without exercise. 5) We will see you back in 6 months  Relevant  Orders   CBC with Differential   COMPLETE METABOLIC PANEL WITH GFR     Other   Hyperlipidemia    Unknown control, last lab levels 04/10/2018 show lipids well controlled.  Has not been on medications.  Will have labs drawn today to assess levels.      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  Previously managed by GYN for hormone therapy, will continue at this time as tolerating well.  Low risk for complications and will discuss titration off of medications as approaches closer to 57 or any change in health concerns that may increase risk for complications.      Relevant Medications   estradiol (ESTRACE) 1 MG tablet   Insomnia    Currently tolerating Belsomra and is sleeping well when takes the medication, awaking well rested, and feels is tolerating well without side effect.  Will continue this medication at current dosage.      Relevant Medications   Suvorexant (BELSOMRA) 15 MG TABS    Other Visit Diagnoses    Need for immunization against influenza    -  Primary   Relevant Orders   Flu Vaccine QUAD 6+ mos PF IM (Fluarix Quad PF)      Meds ordered this encounter  Medications  . estradiol (ESTRACE) 1 MG tablet    Sig: Take 1 tablet (1 mg total) by mouth 2 (two) times daily.    Dispense:  180 tablet    Refill:  0  . Suvorexant (BELSOMRA) 15 MG TABS    Sig: Take 15 mg by mouth at bedtime as needed.    Dispense:  30 tablet    Refill:  1      Follow up plan: Return in about 6 months (around 05/11/2020) for HTN, Hyperlipidemia, Insomnia F/U.   Harlin Rain, [place] [facility] Nurse Practitioner [facility] 11/09/2019, 9:12 AM

## 2019-11-09 NOTE — Patient Instructions (Addendum)
I have put in orders for labs and we will be in touch once the results are received.  I have sent in refills to your pharmacy.  If you need additional refills, contact your pharmacy and they will send in a request.  Sleep hygiene is the single most effective treatment for sleep issues, but it is hard work.  Tips for a good night's sleep:  -Keep sleep environment comfortable and conducive to sleep -Keep regular sleep schedule 7 nights a week -Avoiding naps during the day -Avoiding going to bed until drowsy and ready to sleep, not trying to sleep, and not watching the clock -Get out of bed if not asleep within 15-20 minutes and returning only when drowsy -Avoiding caffeine, nicotine, alcohol, and other substances that interfere with sleep before bedtime -Take an hour before your set bedtime and start to wind down: bath/shower, no more TV or phone (the blue light can interfere with sleeping), listen to soothing music, or meditation -No TV in your bedroom -Exercising regularly, at least 6 hours before sleep. Yoga and Tai Chi can improve sleep quality  There are a lot of books and apps that may help guide you with any of the following:   -Progressive muscle relaxation (involves methodical tension and relaxation of different Muscle groups throughout body)  Guided imagery  -YouTube - Gwynne Edinger has free videos on YouTube that can help with meditation and some   Abdominal breathing   Over the counter sleep aid one hour before bed- and gradually wean your use over 2-4 weeks  Some examples are : *Melatonin 5-10 mg *Sleepology (Can find on [employer]) taken according to packaging directions  There are a few online evidence based online programs, unfortunately they are not free.   Developed by a sleep expert who created a drug-free program for insomnia proven more effective than sleeping pills.  www.cbtforinsomnia.com Sleepio is an evidence-based digital sleep improvement program    www.sleepio.com SHUTi is designed to actively help retrain your body and mind for great sleep through six engaging Cognitive Behavioral Therapy for Insomnia strategy and learning sessions  BloggerCourse.com  Having high HDL (good) cholesterol and low triglyceride levels can reduce your risk of heart attack and stroke.  The following steps can be taken to reduce triglyceride levels and increase HDL (good) cholesterol levels.  Avoid or limit alcohol Alcohol significantly raises triglyceride levels.  If your triglycerides are higher than 150, avoid or limit alcohol.  Limit fruit juice and dried fruits Even fresh fruit juice contains a large amount of fructose sugar and can increase triglyceride and blood sugar levels.  Fruit juice and dried fruit are also high in calories and can add unwanted pounds.  Replace fruit juice and dried fruits with fresh fruit that has more fiber and fewer calories.  Limit non-diet soft drinks and sports drinks Non-diet soft drinks and sport drinks contain as many as 12 teaspoons of sugar per serving.  This sugar can increase triglyceride and blood sugar levels.  Non-diet soft drinks and sport drinks also contain calories that add unwanted pounds.  Replace non-diet soft drinks and sports drinks with water, unsweetened tea, diet colas or beverages sweetened with Nutra-Sweet.  Limit concentrated sweets Even fat free or low fat sweets can raise your triglyceride and/or blood sugar levels.  Concentrated sweets include sugar, honey, jelly, candy, cookies, cakes, pies, pastries, frozen desserts, puddings, and sugar sweetened cereals.  Replace concentrated sweets with high fiber foods such as fruit, low fat yogurt, sugar free gelatin  and low fat puddings.  Limit refined carbohydrates Refined carbohydrates include white flour, white bread, white rice, and some pasta.  Limit portion sizes of these foods or replace them with whole wheat bread, lentils, whole grains, brown  rice, and spinach or whole-wheat pastas.  Reduce your weight, if needed Even a 5-10 pound weight loss can cause your triglyceride level to decrease significantly.  You can lose 1-2 pounds per week by limiting your portion sizes and exercising 5-6 times per week.  Include monounsaturated fats in your diet.  Include Monounsaturated fats in your diet Moderate amounts of monounsaturated fat can raise your HDL (good) cholesterol.  Sources of monounsaturated fat include olive oil, canola oil, peanut oil, peanuts, peanut butter, cashews, olives, and avocados.  Choose peanut butter that does not have sugar in the ingredient list.  Since these foods are high in calories, serving sizes should be moderate.  Include fish in your diet Fish is low in saturated fat and rich in Omega-3 fatty acids.  Good choices include mackerel, salmon, herring, bluefish, lake trout, tuna, and sardines canned in oil.  If you smoke, quit Smoking lowers HDL (good) cholesterol and raises triglycerides.  When you quit smoking your HDL (good) cholesterol increases and triglycerides decrease.  Stay active Exercise raises your HDL (good) cholesterol.  Walk, ride a bike, or swim for at least 20 minutes, 3-5 times per week.  Ideally, you should try to exercise for 30-60 minutes most days of the week.  Check with your Healthcare Provider before beginning an exercise program.  Try to get exercise a minimum of 30 minutes per day at least 5 days per week as well as  adequate water intake all while measuring blood pressure a few times per week.  Keep a blood pressure log and bring back to clinic at your next visit.  If your readings are consistently over 140/90 to contact our office/send me a MyChart message and we will see you sooner.  Can try DASH and Mediterranean diet options, avoiding processed foods, lowering sodium intake, avoiding pork products, and eating a plant based diet for optimal health.  You will receive a survey after  today's visit either digitally by e-mail or paper by [employer]. Your experiences and feedback matter to [place].  Please respond so we know how we are doing as we provide care for you.  Call us with any questions/concerns/needs.  It is my goal to be available to you for your health concerns.  Thanks for choosing me to be a partner in your healthcare needs!  Harlin Rain, FNP-C Family Nurse Practitioner [facility] Phone: [phone]

## 2019-11-10 ENCOUNTER — Other Ambulatory Visit: Payer: Self-pay | Admitting: Family Medicine

## 2019-11-10 DIAGNOSIS — R739 Hyperglycemia, unspecified: Secondary | ICD-10-CM

## 2019-11-10 LAB — COMPLETE METABOLIC PANEL WITH GFR
AG Ratio: 2 (calc) (ref 1.0–2.5)
ALT: 17 U/L (ref 6–29)
AST: 13 U/L (ref 10–35)
Albumin: 3.9 g/dL (ref 3.6–5.1)
Alkaline phosphatase (APISO): 61 U/L (ref 31–125)
BUN: 8 mg/dL (ref 7–25)
CO2: 29 mmol/L (ref 20–32)
Calcium: 8.7 mg/dL (ref 8.6–10.2)
Chloride: 105 mmol/L (ref 98–110)
Creat: 0.83 mg/dL (ref 0.50–1.10)
GFR, Est African American: 98 mL/min/{1.73_m2} (ref >=60)
GFR, Est Non African American: 85 mL/min/{1.73_m2} (ref >=60)
Globulin: 2 g/dL (calc) (ref 1.9–3.7)
Glucose, Bld: 109 mg/dL — ABNORMAL HIGH (ref 65–99)
Potassium: 4.5 mmol/L (ref 3.5–5.3)
Sodium: 140 mmol/L (ref 135–146)
Total Bilirubin: 0.5 mg/dL (ref 0.2–1.2)
Total Protein: 5.9 g/dL — ABNORMAL LOW (ref 6.1–8.1)

## 2019-11-10 LAB — CBC WITH DIFFERENTIAL/PLATELET
Absolute Monocytes: 263 cells/uL (ref 200–950)
Basophils Absolute: 11 cells/uL (ref 0–200)
Basophils Relative: 0.3 %
Eosinophils Absolute: 59 cells/uL (ref 15–500)
Eosinophils Relative: 1.6 %
HCT: 35.8 % (ref 35.0–45.0)
Hemoglobin: 12.1 g/dL (ref 11.7–15.5)
Lymphs Abs: 1240 cells/uL (ref 850–3900)
MCH: 30.6 pg (ref 27.0–33.0)
MCHC: 33.8 g/dL (ref 32.0–36.0)
MCV: 90.4 fL (ref 80.0–100.0)
MPV: 10.2 fL (ref 7.5–12.5)
Monocytes Relative: 7.1 %
Neutro Abs: 2128 cells/uL (ref 1500–7800)
Neutrophils Relative %: 57.5 %
Platelets: 181 10*3/uL (ref 140–400)
RBC: 3.96 10*6/uL (ref 3.80–5.10)
RDW: 11.5 % (ref 11.0–15.0)
Total Lymphocyte: 33.5 %
WBC: 3.7 10*3/uL — ABNORMAL LOW (ref 3.8–10.8)

## 2019-11-10 LAB — LIPID PANEL
Cholesterol: 172 mg/dL (ref <200)
HDL: 60 mg/dL (ref >=50)
LDL Cholesterol (Calc): 93 mg/dL (calc)
Non-HDL Cholesterol (Calc): 112 mg/dL (calc) (ref <130)
Total CHOL/HDL Ratio: 2.9 (calc) (ref <5.0)
Triglycerides: 101 mg/dL (ref <150)

## 2019-12-08 ENCOUNTER — Encounter: Payer: Self-pay | Admitting: Family Medicine

## 2019-12-08 ENCOUNTER — Ambulatory Visit: Payer: BC Managed Care – PPO | Admitting: Family Medicine

## 2019-12-08 ENCOUNTER — Other Ambulatory Visit: Payer: Self-pay

## 2019-12-08 VITALS — BP 126/80 | HR 63 | Temp 97.7°F | Ht 65.0 in | Wt 173.0 lb

## 2019-12-08 DIAGNOSIS — N6320 Unspecified lump in the left breast, unspecified quadrant: Secondary | ICD-10-CM

## 2019-12-08 DIAGNOSIS — Z803 Family history of malignant neoplasm of breast: Secondary | ICD-10-CM

## 2019-12-08 DIAGNOSIS — R591 Generalized enlarged lymph nodes: Secondary | ICD-10-CM | POA: Diagnosis not present

## 2019-12-08 NOTE — Assessment & Plan Note (Signed)
Left breast lump at 12 o'clock position, shotty, mobile, non-tender, present x 1 day.  Reports long family history of breast cancer, Mother at age 47, Maternal Grandmother with Mastectomy s/p Breast Cancer and Paternal Aunt with breast cancer.  Has not yet met with genetics.  Reports was having mammograms every 6 months due to family history of breast cancer.  Plan: 1) Diagnostic Mammogram, Left and Right Axillary ultrasound ordered and to be completed 2) Referral placed for clinical genetics for BRCA testing 3) To contact once we receive the results.

## 2019-12-08 NOTE — Patient Instructions (Addendum)
I have put in an order for diagnostic mammogram and both axillary areas for ultrasound for lymphadenopathy.  I have also put in a referral to genetic counseling to discuss BRCA testing and other genetic testing that may be needed.  They will be in touch to schedule your mammogram and ultrasounds.  If you have not heard anything back from them in 1 week to contact our office and I will reach out to the referral coordinator.  For Mammogram screening for breast cancer   Call the [facility] below anytime to schedule your own appointment now that order has been placed.  [facility] [place], [place] [address] Phone: [phone]  [facility] [address] [place], [place] [address] Phone: [phone]  We will plan to see you back in as needed for this and we will contact you once we receive your results.  You will receive a survey after today's visit either digitally by e-mail or paper by [employer]. Your experiences and feedback matter to [place].  Please respond so we know how we are doing as we provide care for you.  Call us with any questions/concerns/needs.  It is my goal to be available to you for your health concerns.  Thanks for choosing me to be a partner in your healthcare needs!  Harlin Rain, FNP-C Family Nurse Practitioner [facility] Phone: [phone]

## 2019-12-08 NOTE — Assessment & Plan Note (Addendum)
Lymphadenopathy of left axilla x 1 week.  4 weeks s/p COVID vaccinations in right arm. Right axilla with lymphadenopathy - all shotty, mobile, non-tender on exam today.  See left breast lump A/P.

## 2019-12-08 NOTE — Progress Notes (Signed)
Subjective:    Patient ID: Theresa [name], female    DOB: March 20, 1971, 49 y.o.   MRN: [id number]  Theresa [name] is a 49 y.o. female presenting on 12/08/2019 for Mass (3 cm lump at 12o'clock of the left breast w/ no tenderness x 1 day, but admits some swelling in the left axillary and arm x 1 week )   HPI  Theresa [name] presents to clinic for concerns of a new breast lump in her left breast at approximately 12 o'clock with some left axilla lymphadenopathy x 1 week.  Reports had received her COVID vaccine approx 4 weeks ago and had both doses in her right arm.  Denies fevers, nipple drainage, swelling, skin changes, fatigue, weight loss, additional swollen lymph nodes, easily bruising, abdominal pain, n/v/d.  Depression screen [facility] 2/9 11/09/2019 04/12/2019 01/08/2019  Decreased Interest 0 0 0  Down, Depressed, Hopeless 0 0 0  PHQ - 2 Score 0 0 0  Altered sleeping - 3 -  Tired, decreased energy - 0 -  Change in appetite - 0 -  Feeling bad or failure about yourself  - 0 -  Trouble concentrating - 0 -  Moving slowly or fidgety/restless - 0 -  Suicidal thoughts - 0 -  PHQ-9 Score - 3 -  Difficult doing work/chores - Not difficult at all -    Social History   Tobacco Use  . Smoking status: Never Smoker  . Smokeless tobacco: Never Used  Substance Use Topics  . Alcohol use: Yes    Comment: rare-occ. monthly  . Drug use: No    Review of Systems  Constitutional: Negative.        Left breast lump  HENT: Negative.   Eyes: Negative.   Respiratory: Negative.   Cardiovascular: Negative.   Gastrointestinal: Negative.   Endocrine: Negative.   Genitourinary: Negative.   Musculoskeletal: Negative.   Skin: Negative.   Allergic/Immunologic: Negative.   Neurological: Negative.   Hematological: Positive for adenopathy. Does not bruise/bleed easily.  Psychiatric/Behavioral: Negative.    Per HPI unless specifically indicated above     Objective:    BP 126/80 (BP Location: Right  Arm, Patient Position: Sitting, Cuff Size: Normal)   Pulse 63   Temp 97.7 F (36.5 C) (Temporal)   Ht 5' 5"  (1.651 m)   Wt 173 lb (78.5 kg)   LMP 10/24/2014 (Approximate)   BMI 28.79 kg/m   Wt Readings from Last 3 Encounters:  12/08/19 173 lb (78.5 kg)  11/09/19 171 lb 6.4 oz (77.7 kg)  04/12/19 178 lb 9.6 oz (81 kg)    Physical Exam Vitals reviewed.  Constitutional:      General: She is not in acute distress.    Appearance: Normal appearance. She is well-developed, well-groomed and overweight. She is not ill-appearing or toxic-appearing.  HENT:     Head: Normocephalic.  Eyes:     General: Lids are normal. Vision grossly intact.        Right eye: No discharge.        Left eye: No discharge.     Extraocular Movements: Extraocular movements intact.     Conjunctiva/sclera: Conjunctivae normal.     Pupils: Pupils are equal, round, and reactive to light.  Cardiovascular:     Pulses: Normal pulses.  Pulmonary:     Effort: Pulmonary effort is normal.  Chest:     Chest wall: No lacerations, deformity, swelling, tenderness, crepitus or edema.     Breasts:  Right: Normal. No swelling, bleeding, inverted nipple, mass, nipple discharge, skin change or tenderness.        Left: Mass present. No swelling, bleeding, inverted nipple, nipple discharge, skin change or tenderness.     Comments: Left breast mass at 12 o'clock position, shotty, mobile, non-tender, approx 2-3 cm.  Left axilla with lymphadenopathy, mobile, shotty, non-tender  Right axilla with lymphadenopathy, mobile, shotty, non-tender.  Discussed right axilla lymphadenopathy can be related to COVID vaccination completed 4 weeks ago. Abdominal:     General: Abdomen is flat. There is no distension.     Palpations: Abdomen is soft.     Tenderness: There is no abdominal tenderness.  Lymphadenopathy:     Upper Body:     Right upper body: Axillary adenopathy present. No supraclavicular or pectoral adenopathy.     Left  upper body: Axillary adenopathy present. No supraclavicular or pectoral adenopathy.  Skin:    General: Skin is warm and dry.     Capillary Refill: Capillary refill takes less than 2 seconds.  Neurological:     General: No focal deficit present.     Mental Status: She is alert and oriented to person, place, and time.     Cranial Nerves: No cranial nerve deficit.     Sensory: No sensory deficit.     Motor: No weakness.     Coordination: Coordination normal.     Gait: Gait normal.  Psychiatric:        Mood and Affect: Mood normal.        Behavior: Behavior normal. Behavior is cooperative.        Thought Content: Thought content normal.        Judgment: Judgment normal.     Results for orders placed or performed in visit on 11/09/19  CBC with Differential  Result Value Ref Range   WBC 3.7 (L) 3.8 - 10.8 Thousand/uL   RBC 3.96 3.80 - 5.10 Million/uL   Hemoglobin 12.1 11.7 - 15.5 g/dL   HCT 35.8 35.0 - 45.0 %   MCV 90.4 80.0 - 100.0 fL   MCH 30.6 27.0 - 33.0 pg   MCHC 33.8 32.0 - 36.0 g/dL   RDW 11.5 11.0 - 15.0 %   Platelets 181 140 - 400 Thousand/uL   MPV 10.2 7.5 - 12.5 fL   Neutro Abs 2,128 1,500 - 7,800 cells/uL   Lymphs Abs 1,240 850 - 3,900 cells/uL   Absolute Monocytes 263 200 - 950 cells/uL   Eosinophils Absolute 59 15 - 500 cells/uL   Basophils Absolute 11 0 - 200 cells/uL   Neutrophils Relative % 57.5 %   Total Lymphocyte 33.5 %   Monocytes Relative 7.1 %   Eosinophils Relative 1.6 %   Basophils Relative 0.3 %  COMPLETE METABOLIC PANEL WITH GFR  Result Value Ref Range   Glucose, Bld 109 (H) 65 - 99 mg/dL   BUN 8 7 - 25 mg/dL   Creat 0.83 0.50 - 1.10 mg/dL   GFR, Est Non African American 85 > OR = 60 mL/min/1.31m   GFR, Est African American 98 > OR = 60 mL/min/1.723m  BUN/Creatinine Ratio NOT APPLICABLE 6 - 22 (calc)   Sodium 140 135 - 146 mmol/L   Potassium 4.5 3.5 - 5.3 mmol/L   Chloride 105 98 - 110 mmol/L   CO2 29 20 - 32 mmol/L   Calcium 8.7 8.6 -  10.2 mg/dL   Total Protein 5.9 (L) 6.1 - 8.1 g/dL   Albumin 3.9  3.6 - 5.1 g/dL   Globulin 2.0 1.9 - 3.7 g/dL (calc)   AG Ratio 2.0 1.0 - 2.5 (calc)   Total Bilirubin 0.5 0.2 - 1.2 mg/dL   Alkaline phosphatase (APISO) 61 31 - 125 U/L   AST 13 10 - 35 U/L   ALT 17 6 - 29 U/L  Lipid Profile  Result Value Ref Range   Cholesterol 172 <200 mg/dL   HDL 60 > OR = 50 mg/dL   Triglycerides 101 <150 mg/dL   LDL Cholesterol (Calc) 93 mg/dL (calc)   Total CHOL/HDL Ratio 2.9 <5.0 (calc)   Non-HDL Cholesterol (Calc) 112 <130 mg/dL (calc)      Assessment & Plan:   Problem List Items Addressed This Visit      Immune and Lymphatic   Lymphadenopathy    Lymphadenopathy of left axilla x 1 week.  4 weeks s/p COVID vaccinations in right arm. Right axilla with lymphadenopathy - all shotty, mobile, non-tender on exam today.  See left breast lump A/P.        Other   Left breast lump    Left breast lump at 12 o'clock position, shotty, mobile, non-tender, present x 1 day.  Reports long family history of breast cancer, Mother at age 106, Maternal Grandmother with Mastectomy s/p Breast Cancer and Paternal Aunt with breast cancer.  Has not yet met with genetics.  Reports was having mammograms every 6 months due to family history of breast cancer.  Plan: 1) Diagnostic Mammogram, Left and Right Axillary ultrasound ordered and to be completed 2) Referral placed for clinical genetics for BRCA testing 3) To contact once we receive the results.       Other Visit Diagnoses    Breast mass, left    -  Primary   Relevant Orders   US BREAST LTD UNI LEFT INC AXILLA   US BREAST LTD UNI RIGHT INC AXILLA   MM DIAG BREAST TOMO BILATERAL   Family history of breast cancer       Relevant Orders   Ambulatory referral to Genetics      No orders of the defined types were placed in this encounter.     Follow up plan: Return if symptoms worsen or fail to improve.   Harlin Rain, [place] Family Nurse  Practitioner [facility] 12/08/2019, 10:26 AM

## 2019-12-16 ENCOUNTER — Inpatient Hospital Stay: Payer: BC Managed Care – PPO | Attending: Licensed Clinical Social Worker | Admitting: Licensed Clinical Social Worker

## 2019-12-17 ENCOUNTER — Other Ambulatory Visit: Payer: BC Managed Care – PPO

## 2019-12-21 ENCOUNTER — Ambulatory Visit
Admission: RE | Admit: 2019-12-21 | Discharge: 2019-12-21 | Disposition: A | Discharge status: Home / Self Care | Payer: BC Managed Care – PPO | Source: Ambulatory Visit | Attending: Family Medicine | Admitting: Family Medicine

## 2019-12-21 ENCOUNTER — Other Ambulatory Visit: Payer: Self-pay | Admitting: Family Medicine

## 2019-12-21 DIAGNOSIS — N6320 Unspecified lump in the left breast, unspecified quadrant: Secondary | ICD-10-CM

## 2019-12-21 IMAGING — MG MM  DIGITAL DIAGNOSTIC BREAST BILAT IMPLANT W/ TOMO W/ CAD
8 of 12 series · 8 of 32 positions shown · non-contrast
Comparison: Previous exam(s).

CLINICAL DATA: Palpable lump in the left breast

EXAM:
DIGITAL DIAGNOSTIC BILATERAL MAMMOGRAM WITH IMPLANTS, CAD AND TOMO
ULTRASOUND LEFT BREAST
The patient has prepectoral implants. Standard and implant displaced
views were performed.

[L MLO]
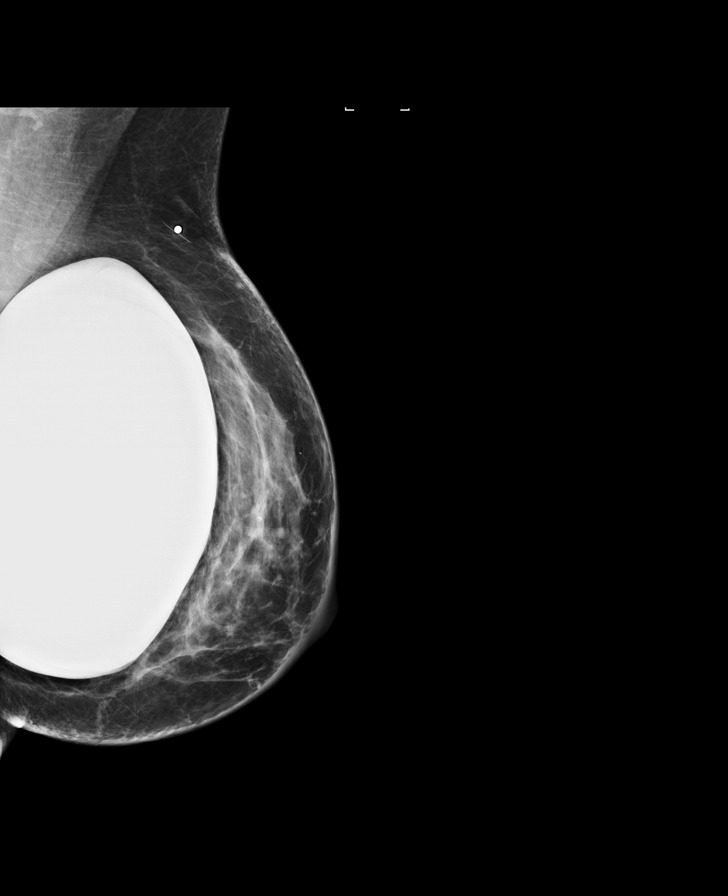

[R CC synth-2D]
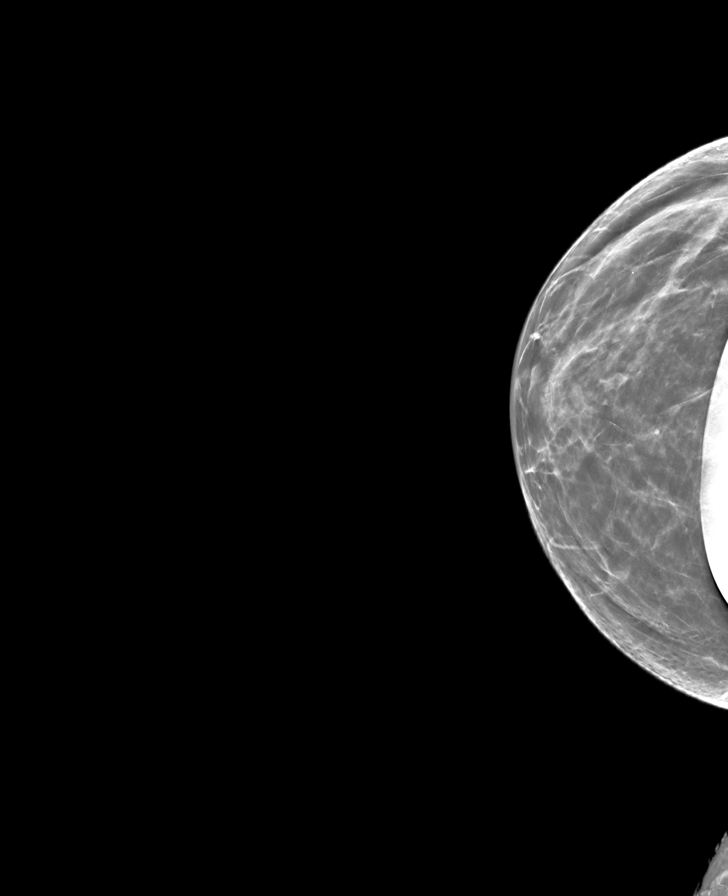

[L MLO synth-2D (1 of 2)]
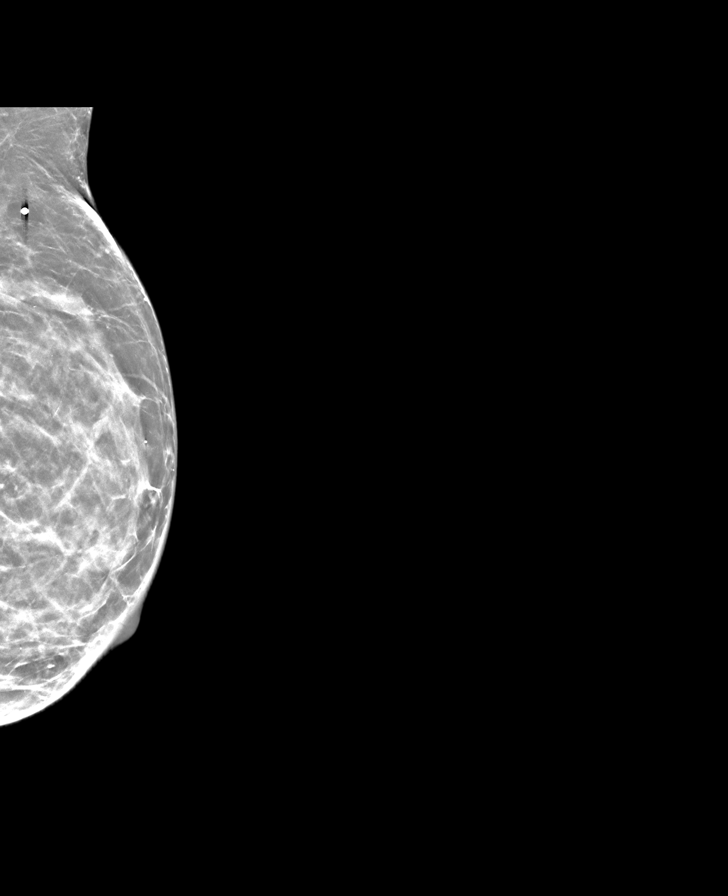

[L TAN synth-2D]
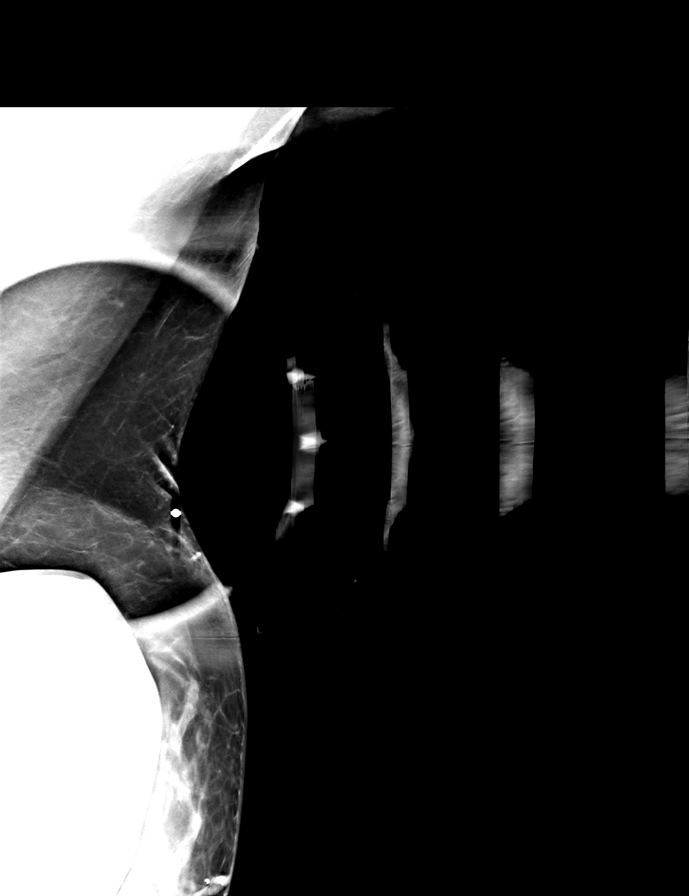

[R MLO synth-2D]
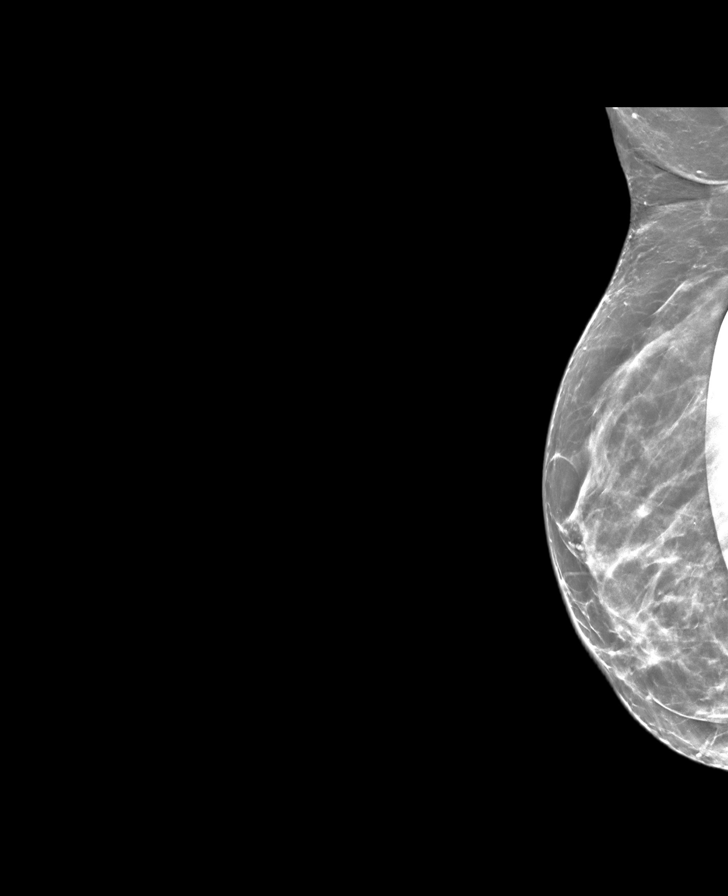

[L MLO synth-2D (2 of 2)]
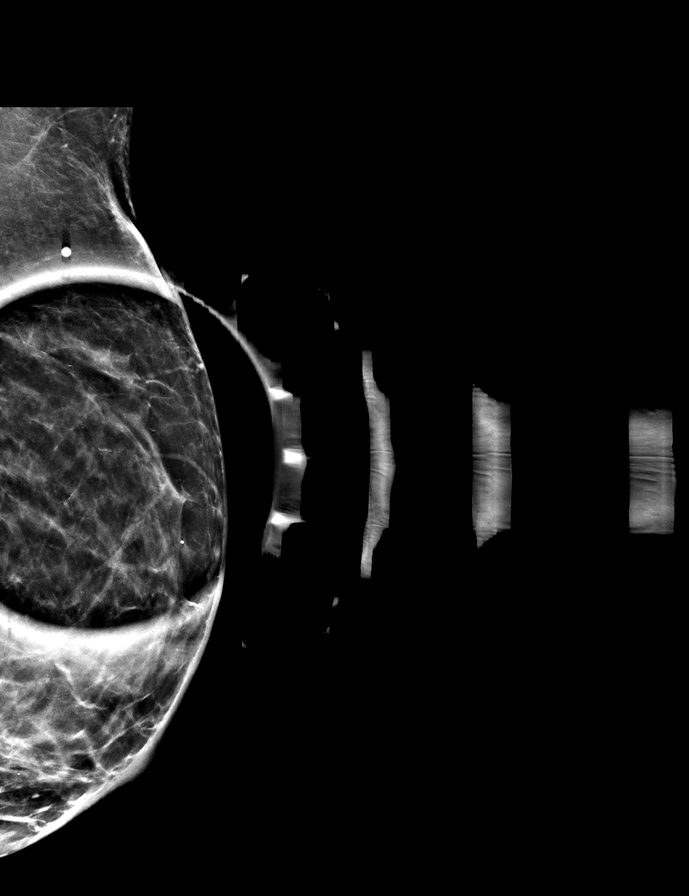

[L CC synth-2D]
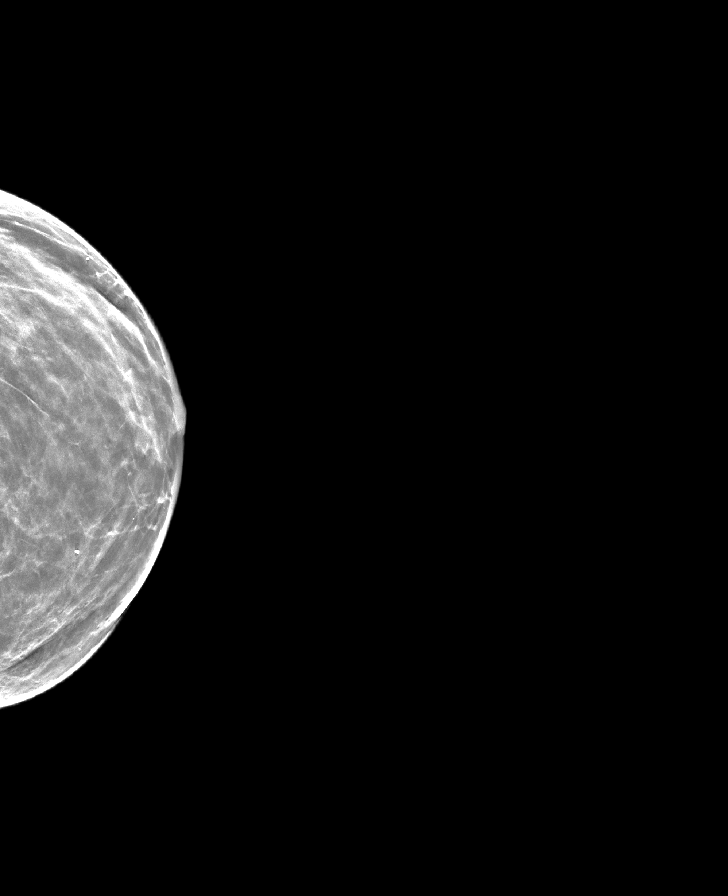

[R MLOID BREAST TOMOSYNTHESIS IMAGE tomo · tomo slice 30/59.0]
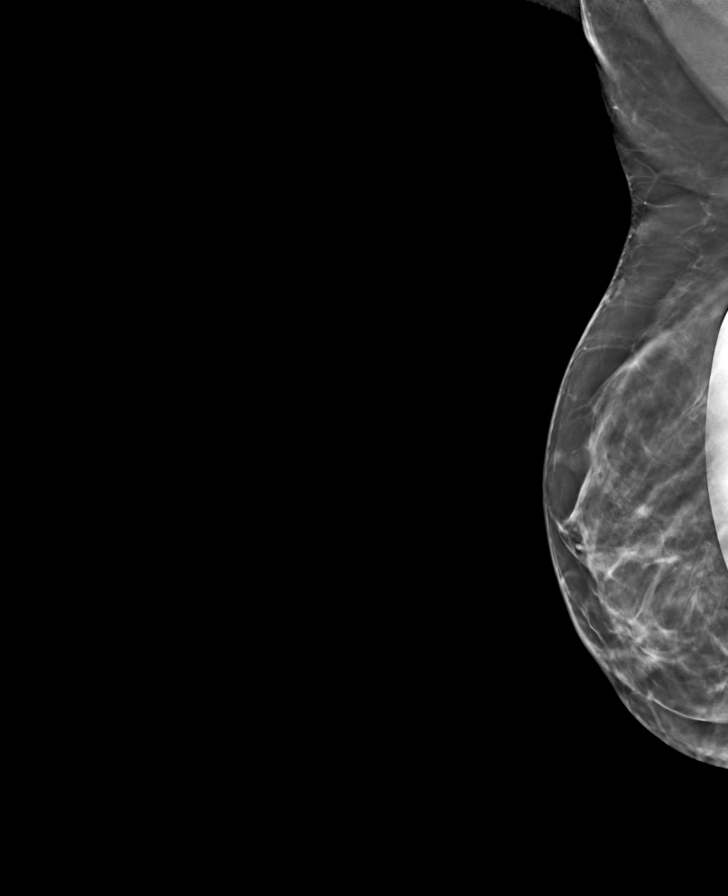

[8 of 32 positions shown; findings below may reference images not displayed]

ACR Breast Density Category b: There are scattered areas of
fibroglandular density.
FINDINGS: An asymmetry in the superior left breast on the pushback view
improves on additional imaging and is likely not changed compared to
previous mammograms given difference in technique. No other
suspicious mammographic findings are seen bilaterally.

On physical exam, no suspicious lumps are identified

Targeted ultrasound is performed, showing a probably benign mass in
the left breast at 1 o'clock, 7 cm from the nipple measuring 7 x 9 x
3 mm, likely correlating with the asymmetry seen mammographically.
This is probably benign and increased conspicuity is likely due to
difference in technique. No abnormalities in the region of the
patient's lump. The edge of the patient's implant is in this region.

Mammographic images were processed with CAD.
IMPRESSION: The patient may be feeling the edge of her left implant. There is a
probably benign left breast mass at 1 o'clock, 7 cm from the nipple.
No other suspicious findings.

RECOMMENDATION:
Recommend six-month follow-up mammogram and ultrasound of the
probably benign left breast mass. Treatment of the patient's
symptoms should be based on clinical and physical exam given lack of
imaging findings.

I have discussed the findings and recommendations with the patient.
If applicable, a reminder letter will be sent to the patient
regarding the next appointment.

BI-RADS CATEGORY  3: Probably benign.

## 2019-12-21 IMAGING — US US BREAST*L* LIMITED INC AXILLA
1 series · 7 of 7 positions shown · non-contrast
Comparison: Previous exam(s).

CLINICAL DATA: Palpable lump in the left breast

EXAM:
DIGITAL DIAGNOSTIC BILATERAL MAMMOGRAM WITH IMPLANTS, CAD AND TOMO
ULTRASOUND LEFT BREAST
The patient has prepectoral implants. Standard and implant displaced
views were performed.

[Series 1: us breast*left* limited inc axilla · 0.06mm/px · 7 of 7 slices shown]
[im 1/7]
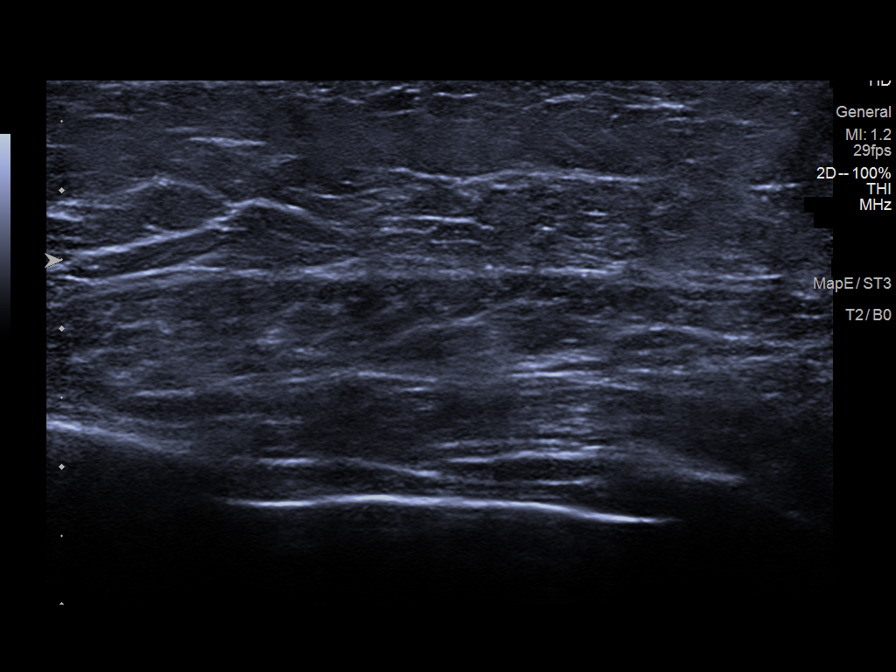
[im 2/7]
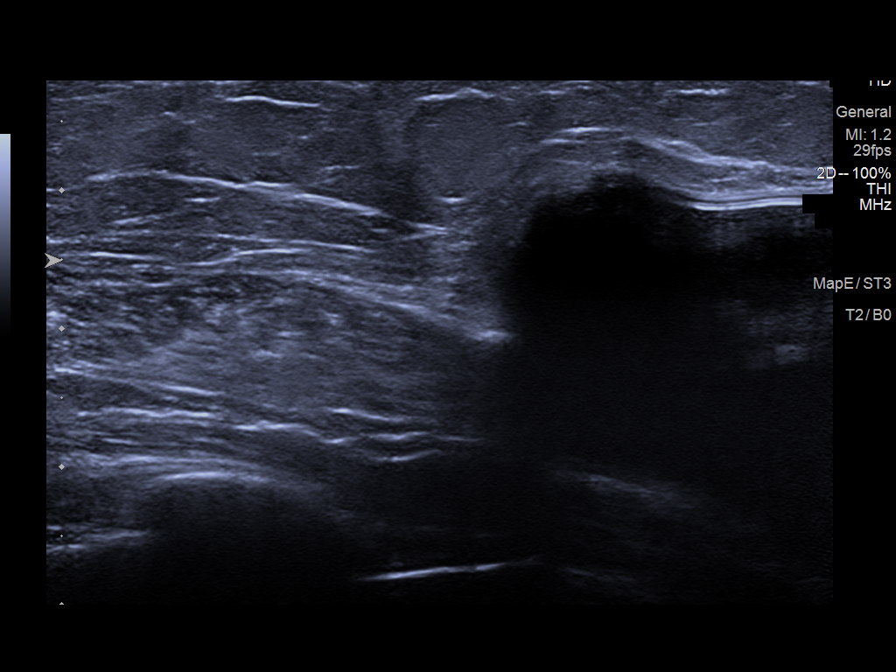
[im 3/7]
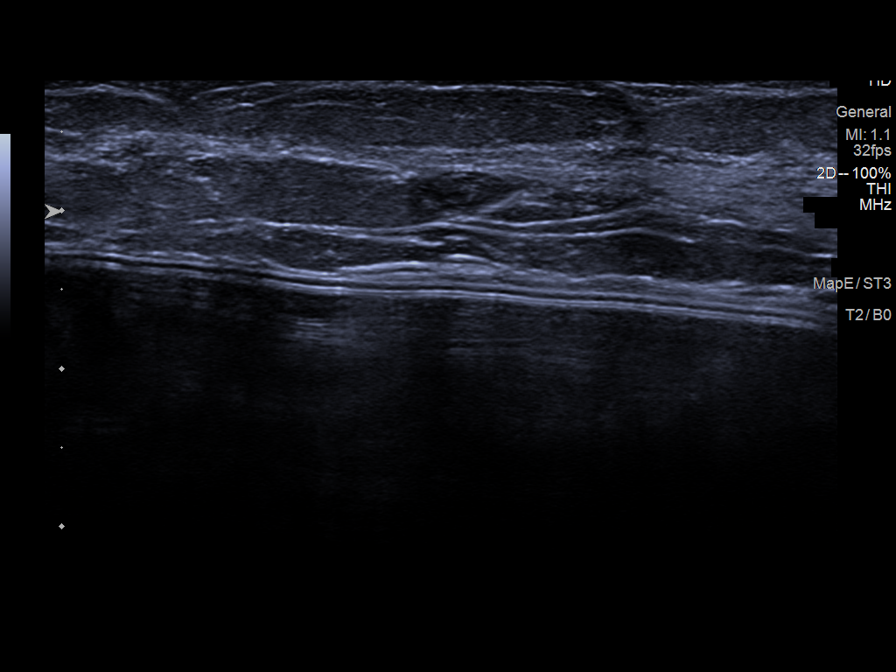
[im 4/7]
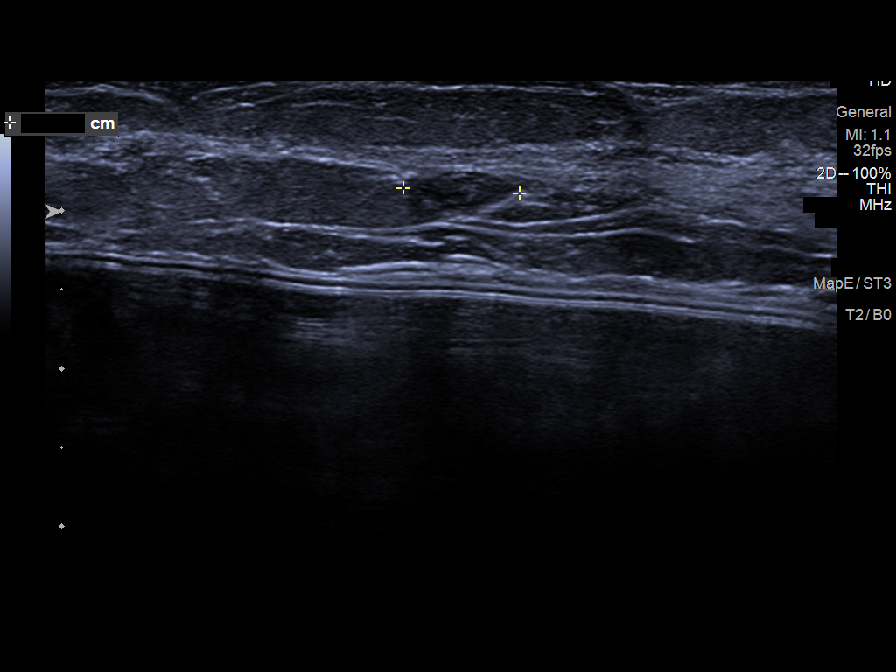
[im 5/7]
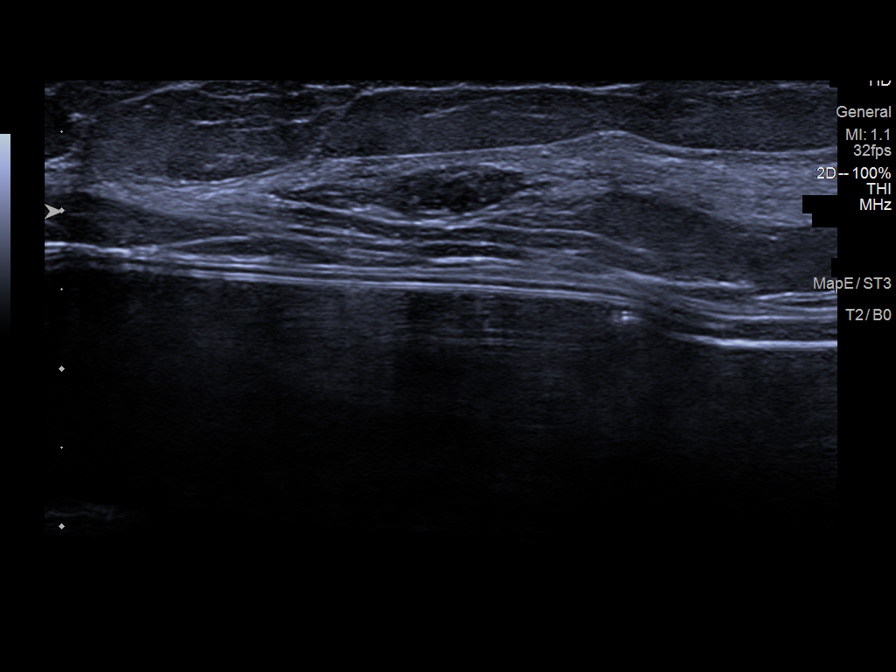
[im 6/7]
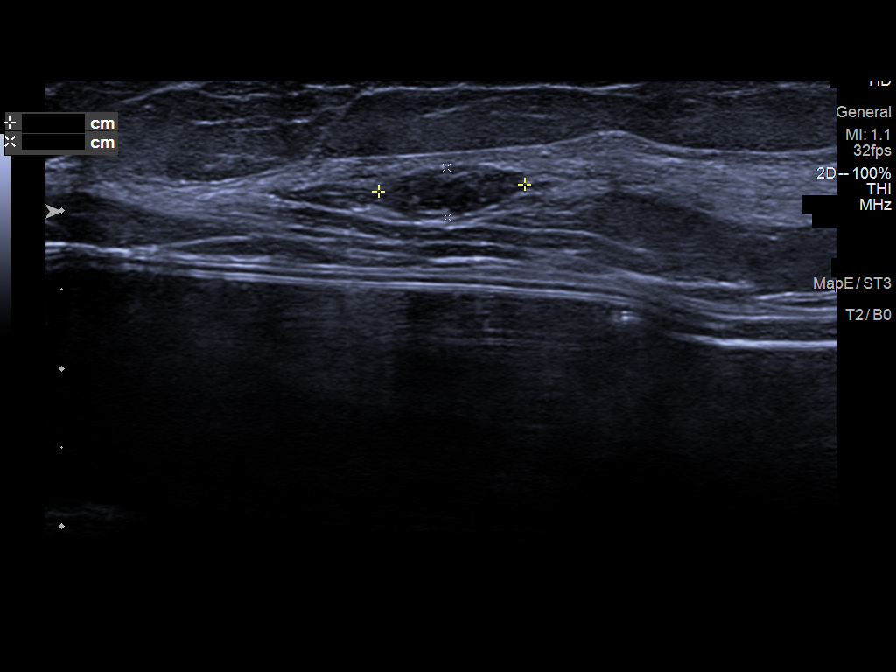
[im 7/7]
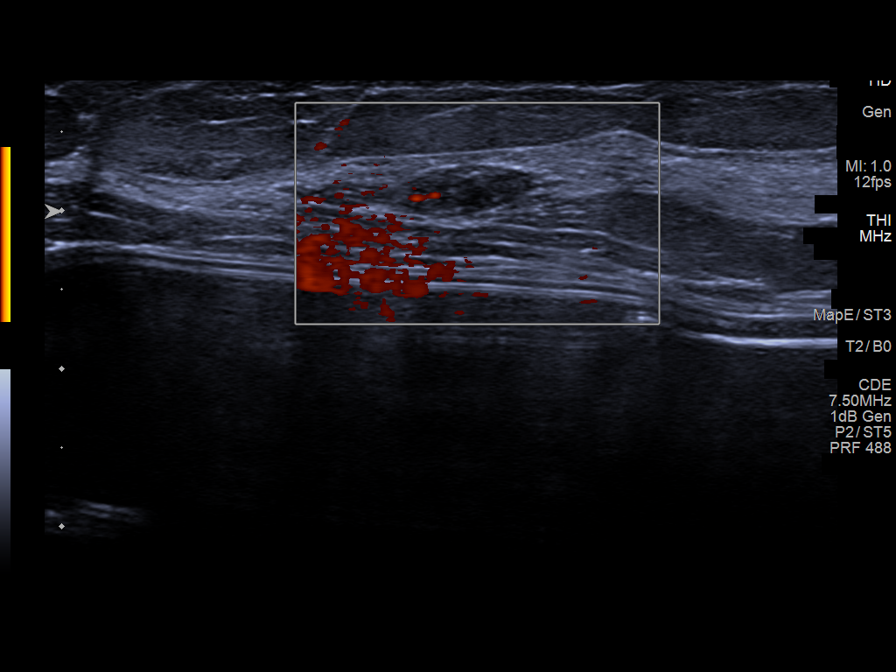

[7 of 7 positions shown; findings below may reference images not displayed]

ACR Breast Density Category b: There are scattered areas of
fibroglandular density.
FINDINGS: An asymmetry in the superior left breast on the pushback view
improves on additional imaging and is likely not changed compared to
previous mammograms given difference in technique. No other
suspicious mammographic findings are seen bilaterally.

On physical exam, no suspicious lumps are identified

Targeted ultrasound is performed, showing a probably benign mass in
the left breast at 1 o'clock, 7 cm from the nipple measuring 7 x 9 x
3 mm, likely correlating with the asymmetry seen mammographically.
This is probably benign and increased conspicuity is likely due to
difference in technique. No abnormalities in the region of the
patient's lump. The edge of the patient's implant is in this region.

Mammographic images were processed with CAD.
IMPRESSION: The patient may be feeling the edge of her left implant. There is a
probably benign left breast mass at 1 o'clock, 7 cm from the nipple.
No other suspicious findings.

RECOMMENDATION:
Recommend six-month follow-up mammogram and ultrasound of the
probably benign left breast mass. Treatment of the patient's
symptoms should be based on clinical and physical exam given lack of
imaging findings.

I have discussed the findings and recommendations with the patient.
If applicable, a reminder letter will be sent to the patient
regarding the next appointment.

BI-RADS CATEGORY  3: Probably benign.

## 2019-12-21 NOTE — Progress Notes (Signed)
Results from mammo and ultrasound reported as likely benign and to repeat in 6 months with mammogram and ultrasound.

## 2019-12-24 ENCOUNTER — Encounter: Payer: Self-pay | Admitting: Licensed Clinical Social Worker

## 2020-01-02 ENCOUNTER — Other Ambulatory Visit: Payer: Self-pay | Admitting: Nurse Practitioner

## 2020-01-02 DIAGNOSIS — F314 Bipolar disorder, current episode depressed, severe, without psychotic features: Secondary | ICD-10-CM

## 2020-01-02 NOTE — Telephone Encounter (Signed)
Requested Prescriptions  Pending Prescriptions Disp Refills  . buPROPion (WELLBUTRIN SR) 150 MG 12 hr tablet [Pharmacy Med Name: BUPROPION HCL SR 150 MG TABLET] 180 tablet 1    Sig: TAKE 1 TABLET BY MOUTH TWICE A DAY     Psychiatry: Antidepressants - bupropion Passed - 01/02/2020 10:00 AM      Passed - Completed PHQ-2 or PHQ-9 in the last 360 days.      Passed - Last BP in normal range    BP Readings from Last 1 Encounters:  12/08/19 126/80         Passed - Valid encounter within last 6 months    Recent Outpatient Visits          3 weeks ago Breast mass, left   [place], FNP   1 month ago Need for immunization against influenza   [place], FNP   8 months ago Psychophysiological insomnia   [facility] [employer], NP   11 months ago Essential hypertension   [facility] Olin Hauser, DO   1 year ago Essential hypertension   [facility] [employer], NP      Future Appointments            In 4 months [place], Lupita Raider, [facility], [facility]

## 2020-01-17 ENCOUNTER — Other Ambulatory Visit: Payer: Self-pay | Admitting: Family Medicine

## 2020-01-17 DIAGNOSIS — R1032 Left lower quadrant pain: Secondary | ICD-10-CM

## 2020-01-17 DIAGNOSIS — G8929 Other chronic pain: Secondary | ICD-10-CM

## 2020-01-17 NOTE — Telephone Encounter (Signed)
Requested Prescriptions  Pending Prescriptions Disp Refills  . gabapentin (NEURONTIN) 600 MG tablet [Pharmacy Med Name: GABAPENTIN 600 MG TABLET] 120 tablet 3    Sig: TAKE 1 TABLET BY MOUTH IN THE MORNING TAKE 1 TABLET IN THE EVENING AND TAKE 2 TABLETS AT BEDTIME     Neurology: Anticonvulsants - gabapentin Passed - 01/17/2020  1:30 AM      Passed - Valid encounter within last 12 months    Recent Outpatient Visits          1 month ago Breast mass, left   [facility], Lupita Raider, FNP   2 months ago Need for immunization against influenza   [place], FNP   9 months ago Psychophysiological insomnia   [facility] Merrilyn Puma, Jerrel Ivory, NP   1 year ago Essential hypertension   [facility] Olin Hauser, DO   1 year ago Essential hypertension   [facility] Mikey [employer], NP      Future Appointments            In 3 months [place], Lupita Raider, [facility], [facility]

## 2020-03-08 ENCOUNTER — Other Ambulatory Visit: Payer: Self-pay | Admitting: Family Medicine

## 2020-03-08 DIAGNOSIS — E2839 Other primary ovarian failure: Secondary | ICD-10-CM

## 2020-03-09 MED ORDER — ESTRADIOL 1 MG PO TABS: 1.0000 mg | ORAL_TABLET | Freq: Two times a day (BID) | ORAL | 0 refills | Status: DC

## 2020-04-01 ENCOUNTER — Other Ambulatory Visit: Payer: Self-pay | Admitting: Nurse Practitioner

## 2020-04-01 DIAGNOSIS — I10 Essential (primary) hypertension: Secondary | ICD-10-CM

## 2020-04-01 NOTE — Telephone Encounter (Signed)
Requested Prescriptions  Pending Prescriptions Disp Refills  . metoprolol succinate (TOPROL-XL) 25 MG 24 hr tablet [Pharmacy Med Name: METOPROLOL SUCC ER 25 MG TAB] 90 tablet 0    Sig: TAKE 1 TABLET BY MOUTH EVERY DAY     Cardiovascular:  Beta Blockers Passed - 04/01/2020 10:21 AM      Passed - Last BP in normal range    BP Readings from Last 1 Encounters:  12/08/19 126/80         Passed - Last Heart Rate in normal range    Pulse Readings from Last 1 Encounters:  12/08/19 63         Passed - Valid encounter within last 6 months    Recent Outpatient Visits          3 months ago Breast mass, left   [place], FNP   4 months ago Need for immunization against influenza   [place], FNP   11 months ago Psychophysiological insomnia   [facility] Merrilyn Puma, Jerrel Ivory, NP   1 year ago Essential hypertension   [place], Devonne Doughty, DO   1 year ago Essential hypertension   [place], Jerrel Ivory, NP      Future Appointments            In 1 month [place], Lupita Raider, [facility], [facility]

## 2020-05-10 ENCOUNTER — Other Ambulatory Visit: Payer: Self-pay | Admitting: Family Medicine

## 2020-05-10 DIAGNOSIS — I10 Essential (primary) hypertension: Secondary | ICD-10-CM

## 2020-05-11 ENCOUNTER — Ambulatory Visit: Payer: BC Managed Care – PPO | Admitting: Family Medicine

## 2020-05-11 ENCOUNTER — Other Ambulatory Visit: Payer: Self-pay

## 2020-05-11 ENCOUNTER — Encounter: Payer: Self-pay | Admitting: Family Medicine

## 2020-05-11 ENCOUNTER — Other Ambulatory Visit: Payer: Self-pay | Admitting: Family Medicine

## 2020-05-11 VITALS — BP 114/73 | HR 65 | Temp 98.2°F | Resp 18 | Ht 65.0 in | Wt 173.6 lb

## 2020-05-11 DIAGNOSIS — E2839 Other primary ovarian failure: Secondary | ICD-10-CM | POA: Diagnosis not present

## 2020-05-11 DIAGNOSIS — N6320 Unspecified lump in the left breast, unspecified quadrant: Secondary | ICD-10-CM

## 2020-05-11 DIAGNOSIS — F314 Bipolar disorder, current episode depressed, severe, without psychotic features: Secondary | ICD-10-CM

## 2020-05-11 DIAGNOSIS — I10 Essential (primary) hypertension: Secondary | ICD-10-CM

## 2020-05-11 DIAGNOSIS — F5104 Psychophysiologic insomnia: Secondary | ICD-10-CM

## 2020-05-11 DIAGNOSIS — R739 Hyperglycemia, unspecified: Secondary | ICD-10-CM | POA: Diagnosis not present

## 2020-05-11 LAB — POCT GLYCOSYLATED HEMOGLOBIN (HGB A1C): Hemoglobin A1C: 4.8 % (ref 4.0–5.6)

## 2020-05-11 MED ORDER — ESTRADIOL 1 MG PO TABS: 1.0000 mg | ORAL_TABLET | Freq: Two times a day (BID) | ORAL | 0 refills | Status: AC

## 2020-05-11 NOTE — Assessment & Plan Note (Signed)
Controlled hypertension.  BP is at goal < 130/80.  Pt is working on lifestyle modifications.  Taking medications tolerating well without side effects.  Complications: Overweight, NASH, HLD, Chronic Pain, ETOH Use  Plan: 1. Continue taking metoprolol XL 72m daily and lisinopril 221mdaily 2. Obtain labs at next office visit 3. Encouraged heart healthy diet and increasing exercise to 30 minutes most days of the week, going no more than 2 days in a row without exercise. 4. Check BP 1-2 x per week at home, keep log, and bring to clinic at next appointment. 5. Follow up 6 months.

## 2020-05-11 NOTE — Assessment & Plan Note (Signed)
Left breast lump that had Mammo & U/S 12/21/2019, to repeat with Mammo & Left Breast U/S in October 2021.  Discussed this with patient, provided contact information.  Orders placed for October 2021 for diagnostic mammogram and left breast ultrasound.

## 2020-05-11 NOTE — Assessment & Plan Note (Signed)
Currently stable and well controlled.  Following with [facility], [place], [place], [place].

## 2020-05-11 NOTE — Assessment & Plan Note (Signed)
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  Previously managed by GYN for hormone therapy, but tolerating well so will continue to manage in primary care clinic.  Low risk for complications and will discuss titration off of medications as approaches closer to 57 or any change in health concerns that may increase risk for complications.

## 2020-05-11 NOTE — Assessment & Plan Note (Signed)
Currently taking Belsomra and tolerating well.  Has some nights that the medication does not work as well as would like.  Does awake feeling well rested.  Will continue current medication at this dosage.

## 2020-05-11 NOTE — Patient Instructions (Signed)
Continue all medications as directed  For Mammogram screening for breast cancer   Call the [facility] below anytime to schedule your own appointment now that order has been placed.  [facility] [place], [place] [address] Phone: [phone]  [facility] [address] [place],  [address] Phone: [phone]  Continue lisinopril once daily.    Some of the possible side effects are:  - angioedema: swelling of lips, mouth, and tongue.  If this rare side effect occurs, please go to ED. - cough: you could develop a dry, hacking cough caused by this medicine.  If it occurs, it will go away after stopping this medicine.  Call the clinic before stopping the medication. - kidney damage: we will monitor your labs when we start this medicine and at least one time per year.  If you do not have an change in kidney function when starting this medicine, it will provide kidney protection over time.  We will plan to see you back in 6 months for hypertension and insomnia follow up visit  You will receive a survey after today's visit either digitally by e-mail or paper by [place] mail. Your experiences and feedback matter to [place].  Please respond so we know how we are doing as we provide care for you.  Call us with any questions/concerns/needs.  It is my goal to be available to you for your health concerns.  Thanks for choosing me to be a partner in your healthcare needs!  Harlin Rain, FNP-C Family Nurse Practitioner [facility] Phone: [phone]

## 2020-05-11 NOTE — Progress Notes (Signed)
Subjective:    Patient ID: Theresa [name], female    DOB: 1971/05/25, 49 y.o.   MRN: [id number]  Theresa [name] is a 49 y.o. female presenting on 05/11/2020 for Hypertension and Insomnia   HPI   [name] presents to clinic for follow up on her hypertension and insomnia.  Reports she has continued to take her belsomra, typically with a good night of sleep, has had a few intermittent nights when the medication does not work as well as it has.  Hypertension - She is not checking BP at home or outside of clinic.    - Current medications: lisinopril 76m daily and metoprolol XL 265mdaily, tolerating well without side effects - She is not currently symptomatic. - Pt denies headache, lightheadedness, dizziness, changes in vision, chest tightness/pressure, palpitations, leg swelling, sudden loss of speech or loss of consciousness. - She  reports no regular exercise routine. - Her diet is high in salt, high in fat, and high in carbohydrates.  [facility] 05/11/2020 11/09/2019 04/12/2019  Decreased Interest 0 0 0  Down, Depressed, Hopeless 0 0 0  PHQ - 2 Score 0 0 0  Altered sleeping 3 - 3  Tired, decreased energy 0 - 0  Change in appetite 0 - 0  Feeling bad or failure about yourself  0 - 0  Trouble concentrating 1 - 0  Moving slowly or fidgety/restless 0 - 0  Suicidal thoughts 0 - 0  PHQ-9 Score 4 - 3  Difficult doing work/chores Not difficult at all - Not difficult at all    Social History   Tobacco Use  . Smoking status: Never Smoker  . Smokeless tobacco: Never Used  Vaping Use  . Vaping Use: Never used  Substance Use Topics  . Alcohol use: Yes    Comment: rare-occ. monthly  . Drug use: No    Review of Systems  Constitutional: Negative.   HENT: Negative.   Eyes: Negative.   Respiratory: Negative.   Cardiovascular: Negative.   Gastrointestinal: Negative.   Endocrine: Negative.   Genitourinary: Negative.   Musculoskeletal: Negative.   Skin:  Negative.   Allergic/Immunologic: Negative.   Neurological: Negative.   Hematological: Negative.   Psychiatric/Behavioral: Negative.    Per HPI unless specifically indicated above     Objective:    BP 114/73 (BP Location: Right Arm, Patient Position: Sitting, Cuff Size: Normal)   Pulse 65   Temp 98.2 F (36.8 C) (Oral)   Resp 18   Ht 5' 5"  (1.651 m)   Wt 173 lb 9.6 oz (78.7 kg)   LMP 10/24/2014 (Approximate)   SpO2 99%   BMI 28.89 kg/m   Wt Readings from Last 3 Encounters:  05/11/20 173 lb 9.6 oz (78.7 kg)  12/08/19 173 lb (78.5 kg)  11/09/19 171 lb 6.4 oz (77.7 kg)    Physical Exam Vitals reviewed.  Constitutional:      General: She is not in acute distress.    Appearance: Normal appearance. She is well-developed and well-groomed. She is not ill-appearing or toxic-appearing.  HENT:     Head: Normocephalic and atraumatic.     Nose:     Comments: FaLizbeth Barks in place, covering mouth and nose. Eyes:     General: Lids are normal. Vision grossly intact.        Right eye: No discharge.        Left eye: No discharge.     Extraocular Movements: Extraocular movements intact.  Conjunctiva/sclera: Conjunctivae normal.     Pupils: Pupils are equal, round, and reactive to light.  Cardiovascular:     Rate and Rhythm: Normal rate and regular rhythm.     Pulses: Normal pulses.     Heart sounds: Normal heart sounds. No murmur heard.  No friction rub. No gallop.   Pulmonary:     Effort: Pulmonary effort is normal. No respiratory distress.     Breath sounds: Normal breath sounds.  Musculoskeletal:     Right lower leg: No edema.     Left lower leg: No edema.  Skin:    General: Skin is warm and dry.     Capillary Refill: Capillary refill takes less than 2 seconds.  Neurological:     General: No focal deficit present.     Mental Status: She is alert and oriented to person, place, and time.  Psychiatric:        Attention and Perception: Attention and perception normal.         Mood and Affect: Mood and affect normal.        Speech: Speech normal.        Behavior: Behavior normal. Behavior is cooperative.        Thought Content: Thought content normal.        Cognition and Memory: Cognition and memory normal.        Judgment: Judgment normal.    Results for orders placed or performed in visit on 05/11/20  POCT glycosylated hemoglobin (Hb A1C)  Result Value Ref Range   Hemoglobin A1C 4.8 4.0 - 5.6 %   HbA1c POC (<> result, manual entry)     HbA1c, POC (prediabetic range)     HbA1c, POC (controlled diabetic range)        Assessment & Plan:   Problem List Items Addressed This Visit      Cardiovascular and Mediastinum   Essential hypertension - Primary    Controlled hypertension.  BP is at goal < 130/80.  Pt is working on lifestyle modifications.  Taking medications tolerating well without side effects.  Complications: Overweight, NASH, HLD, Chronic Pain, ETOH Use  Plan: 1. Continue taking metoprolol XL 68m daily and lisinopril 210mdaily 2. Obtain labs at next office visit 3. Encouraged heart healthy diet and increasing exercise to 30 minutes most days of the week, going no more than 2 days in a row without exercise. 4. Check BP 1-2 x per week at home, keep log, and bring to clinic at next appointment. 5. Follow up 6 months.           Other   Hyperglycemia    Hyperglycemia with last set of labs drawn.  POCT A1C in clinic today 4.8%.  Discussed with patient.  No follow up needed at this time.      Relevant Orders   POCT glycosylated hemoglobin (Hb A1C) (Completed)   Bipolar affect, depressed ([place]   Currently stable and well controlled.  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  Previously managed by GYN for hormone therapy, but tolerating well so will continue to manage in primary care clinic.   Low risk for complications and will discuss titration off of medications as approaches closer to 57 or any change in health concerns that may increase risk for complications.      Relevant Medications   estradiol (ESTRACE) 1 MG  tablet   Insomnia    Currently taking Belsomra and tolerating well.  Has some nights that the medication does not work as well as would like.  Does awake feeling well rested.  Will continue current medication at this dosage.      Left breast lump    Left breast lump that had Mammo & U/S 12/21/2019, to repeat with Mammo & Left Breast U/S in October 2021.  Discussed this with patient, provided contact information.  Orders placed for October 2021 for diagnostic mammogram and left breast ultrasound.      Relevant Orders   US BREAST LTD UNI LEFT INC AXILLA   MM DIAG BREAST TOMO BILATERAL      Meds ordered this encounter  Medications  . estradiol (ESTRACE) 1 MG tablet    Sig: Take 1 tablet (1 mg total) by mouth 2 (two) times daily.    Dispense:  180 tablet    Refill:  0    Follow up plan: Return in about 6 months (around 11/08/2020) for HTN and insomnia F/U.   Harlin Rain, [place] Family Nurse Practitioner [facility] 05/11/2020, 8:34 AM

## 2020-05-11 NOTE — Assessment & Plan Note (Signed)
Hyperglycemia with last set of labs drawn.  POCT A1C in clinic today 4.8%.  Discussed with patient.  No follow up needed at this time.

## 2020-06-22 ENCOUNTER — Ambulatory Visit
Admission: RE | Admit: 2020-06-22 | Discharge: 2020-06-22 | Disposition: A | Discharge status: Home / Self Care | Payer: BC Managed Care – PPO | Source: Ambulatory Visit | Attending: Family Medicine | Admitting: Family Medicine

## 2020-06-22 ENCOUNTER — Other Ambulatory Visit: Payer: Self-pay

## 2020-06-22 DIAGNOSIS — N6320 Unspecified lump in the left breast, unspecified quadrant: Secondary | ICD-10-CM | POA: Insufficient documentation

## 2020-06-22 IMAGING — MG MM DIGITAL DIAGNOSTIC UNILAT*L* IMPLANT W/ TOMO W/ CAD
8 series · 8 of 24 positions shown · non-contrast
Comparison: Previous exam(s).

CLINICAL DATA: 48-year-old female for six-month follow-up of LEFT
breast mass.

EXAM:
DIGITAL DIAGNOSTIC LEFT MAMMOGRAM WITH IMPLANTS, CAD AND TOMO
ULTRASOUND LEFT BREAST
The patient has retropectoral implants. Standard and implant
displaced views were performed.

[L MLO synth-2D (1 of 3)]
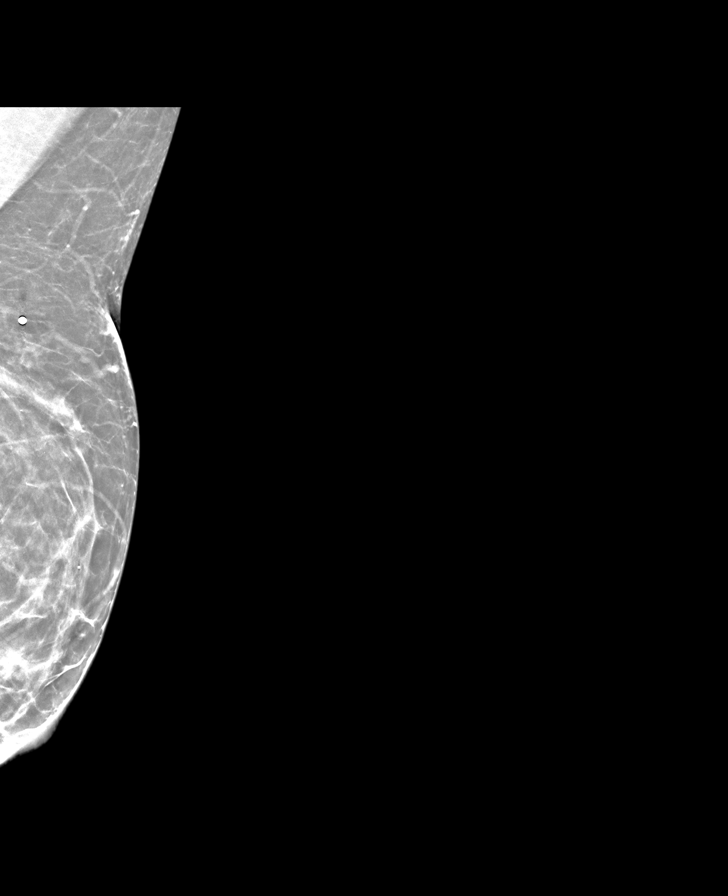

[L MLO synth-2D (2 of 3)]
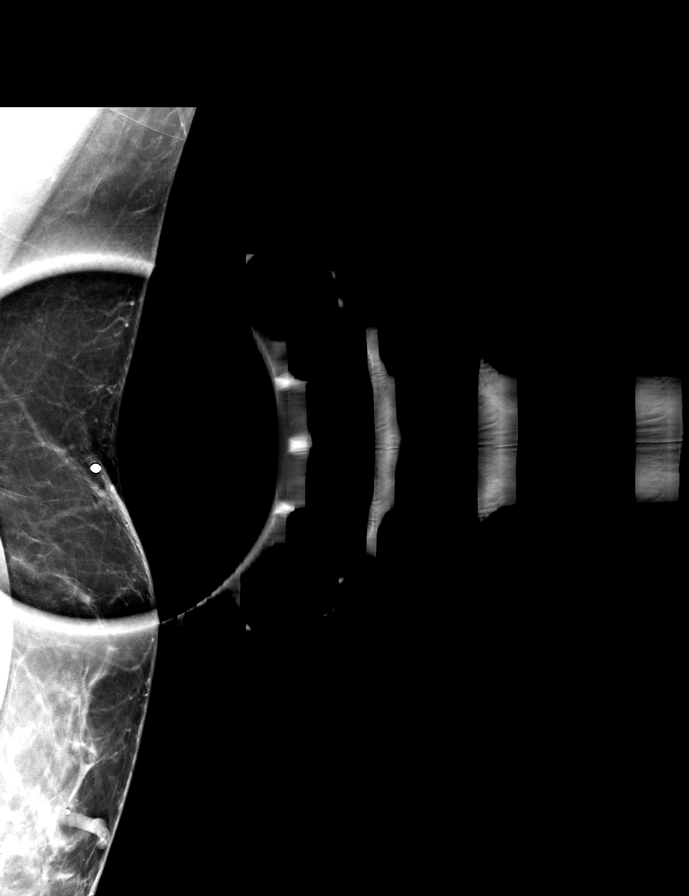

[L MLO synth-2D (3 of 3)]
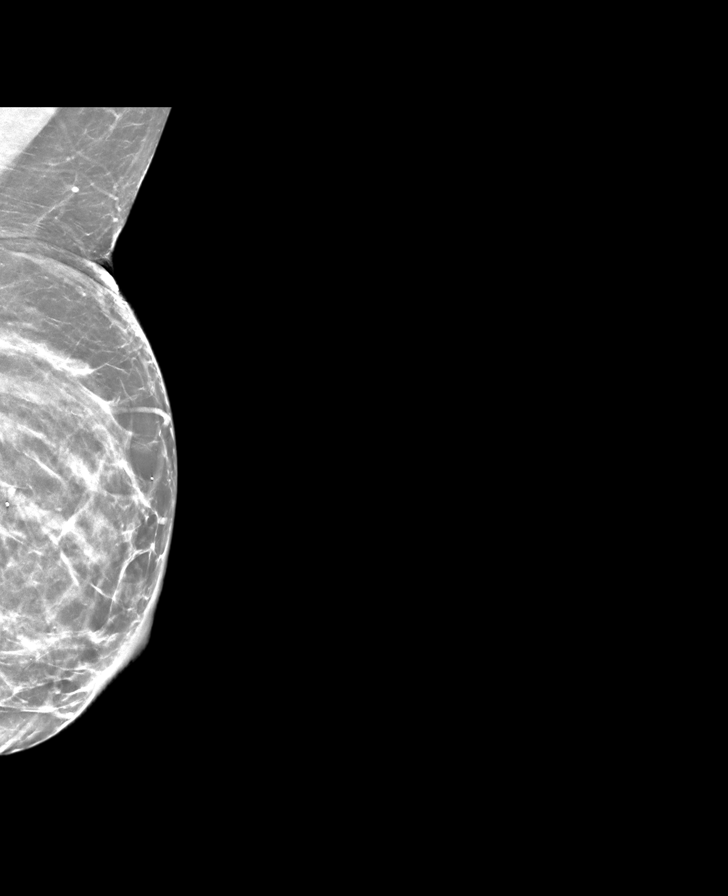

[L CC synth-2D]
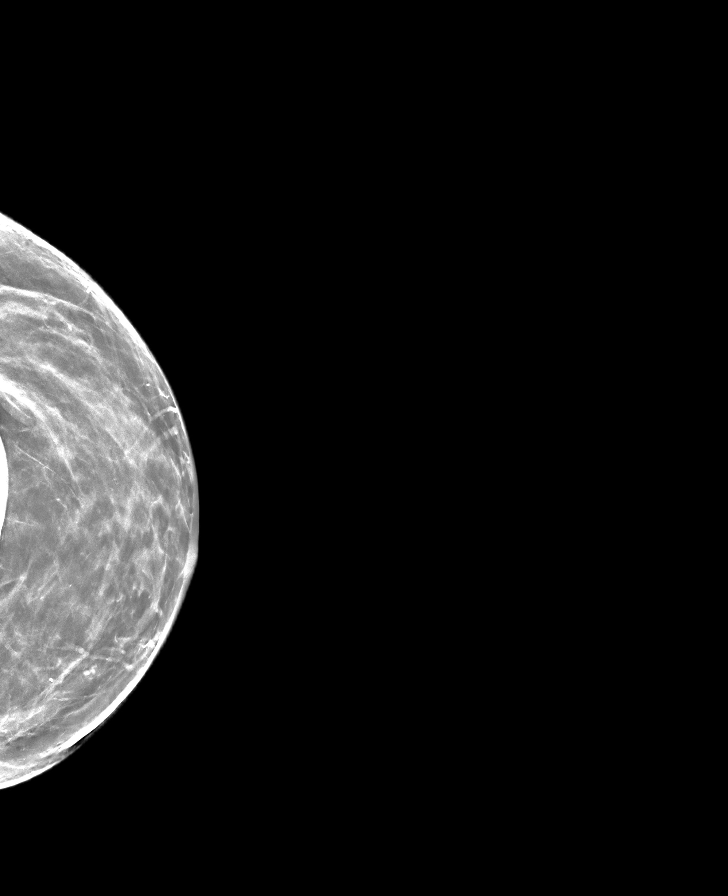

[L MLOID BREAST TOMOSYNTHESIS IMAGE tomo (1 of 3) · tomo slice 27/52.0]
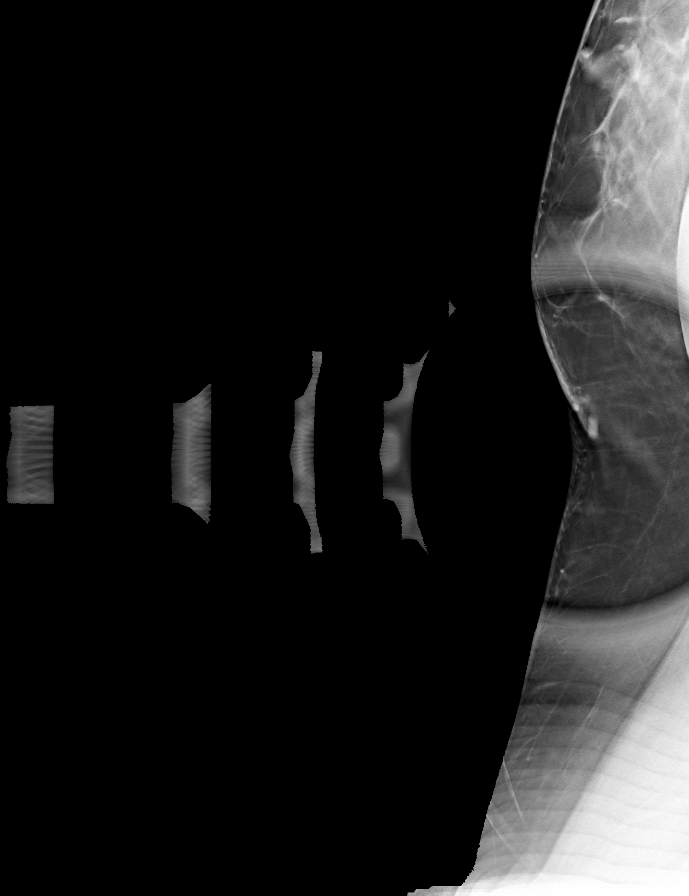

[L MLOID BREAST TOMOSYNTHESIS IMAGE tomo (2 of 3) · tomo slice 33/65.0]
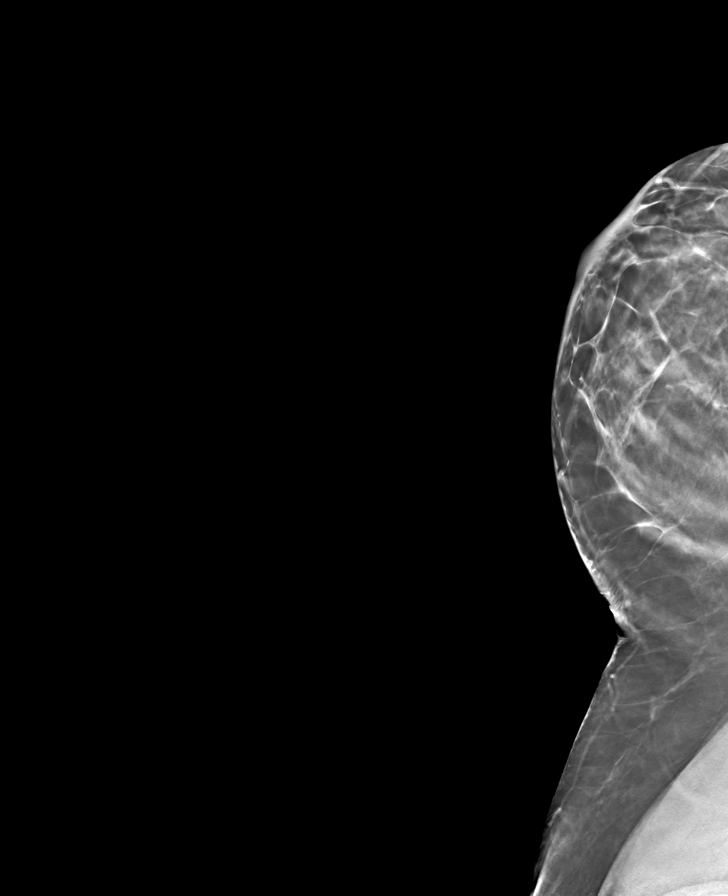

[L MLOID BREAST TOMOSYNTHESIS IMAGE tomo (3 of 3) · tomo slice 27/53.0]
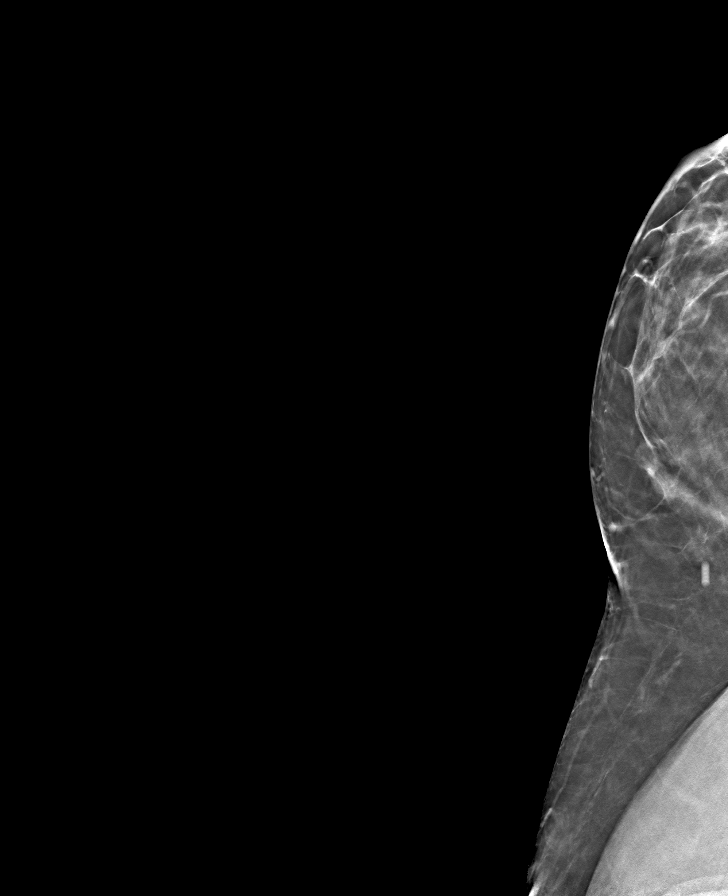

[L CCID BREAST TOMOSYNTHESIS IMAGE tomo · tomo slice 31/60.0]
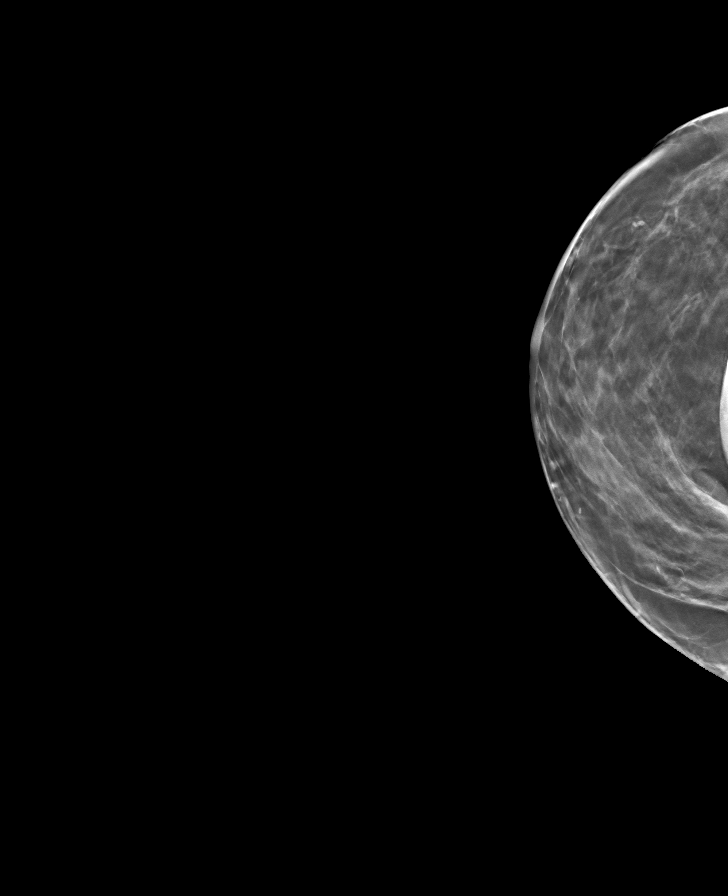

[8 of 24 positions shown; findings below may reference images not displayed]

ACR Breast Density Category c: The breast tissue is heterogeneously
dense, which may obscure small masses.
FINDINGS: 2D/3D full field and spot compression views of the LEFT breast
demonstrate no suspicious mass, distortion or worrisome
calcifications. The previously identified UPPER LEFT breast
asymmetry is less conspicuous.

Mammographic images were processed with CAD.

Targeted ultrasound is performed, showing no sonographic
abnormalities within the UPPER LEFT breast.
IMPRESSION: 1. No persistent abnormality in the area of the previously
identified asymmetry/mass.
2. No mammographic evidence of LEFT breast malignancy.

RECOMMENDATION:
Bilateral screening mammogram in [DATE] to resume annual
mammogram schedule.

I have discussed the findings and recommendations with the patient.
If applicable, a reminder letter will be sent to the patient
regarding the next appointment.

BI-RADS CATEGORY  1: Negative.

## 2020-08-15 ENCOUNTER — Ambulatory Visit: Payer: Self-pay | Admitting: *Deleted

## 2020-08-15 NOTE — Telephone Encounter (Signed)
  Pt called in wanting to know if she should be tested for COVID.   She's got fever, nasal congestion shortness of breath and a dry cough.  I let her know the testing availability of local sites and the [facility] website too.  She thanked me for my help.   I offered her a televisit appt with Cyndia Skeeters but she didn't want an appt at this time.   "I want to test first"   Reason for Disposition . COVID-19 Testing, questions about  Answer Assessment - Initial Assessment Questions 1. COVID-19 DIAGNOSIS: "Who made your Coronavirus (COVID-19) diagnosis?" "Was it confirmed by a positive lab test?" If not diagnosed by a HCP, ask "Are there lots of cases (community spread) where you live?" (See public health department website, if unsure)     Pt called in c/o short of breath, dry cough, fever and not feeling well.   I'm fully vaccinated for COVID but should I be tested anyway? 2. COVID-19 EXPOSURE: "Was there any known exposure to COVID before the symptoms began?" CDC Definition of close contact: within 6 feet (2 meters) for a total of 15 minutes or more over a 24-hour period.      I'm not sure.   Not that I know of. 3. ONSET: "When did the COVID-19 symptoms start?"      2 days ago 4. WORST SYMPTOM: "What is your worst symptom?" (e.g., cough, fever, shortness of breath, muscle aches)     Dry cough 5. COUGH: "Do you have a cough?" If Yes, ask: "How bad is the cough?"       Dry cough 6. FEVER: "Do you have a fever?" If Yes, ask: "What is your temperature, how was it measured, and when did it start?"     yes 7. RESPIRATORY STATUS: "Describe your breathing?" (e.g., shortness of breath, wheezing, unable to speak)      Short of breath when I'm up and around.   Nasal congestion too. 8. BETTER-SAME-WORSE: "Are you getting better, staying the same or getting worse compared to yesterday?"  If getting worse, ask, "In what way?"     Same just wondering about getting tested for COVID. 9. HIGH RISK DISEASE:  "Do you have any chronic medical problems?" (e.g., asthma, heart or lung disease, weak immune system, obesity, etc.)     *No Answer* 10. PREGNANCY: "Is there any chance you are pregnant?" "When was your last menstrual period?"       Not asked 11. OTHER SYMPTOMS: "Do you have any other symptoms?"  (e.g., chills, fatigue, headache, loss of smell or taste, muscle pain, sore throat; new loss of smell or taste especially support the diagnosis of COVID-19)       fatigue  Protocols used: CORONAVIRUS (COVID-19) DIAGNOSED OR SUSPECTED-A-AH

## 2021-02-16 ENCOUNTER — Ambulatory Visit: Payer: BC Managed Care – PPO | Admitting: Nurse Practitioner

## 2021-02-28 ENCOUNTER — Other Ambulatory Visit: Payer: Self-pay

## 2021-02-28 ENCOUNTER — Encounter (INDEPENDENT_AMBULATORY_CARE_PROVIDER_SITE_OTHER): Payer: Self-pay | Admitting: *Deleted

## 2021-03-02 ENCOUNTER — Other Ambulatory Visit: Payer: Self-pay

## 2021-03-02 DIAGNOSIS — I10 Essential (primary) hypertension: Secondary | ICD-10-CM

## 2021-03-02 MED ORDER — LISINOPRIL 20 MG PO TABS: 1.0000 | ORAL_TABLET | Freq: Every day | ORAL | 0 refills | Status: DC

## 2021-03-08 ENCOUNTER — Ambulatory Visit: Payer: Self-pay | Admitting: Pediatrics

## 2021-03-21 ENCOUNTER — Other Ambulatory Visit: Payer: Self-pay

## 2021-05-29 ENCOUNTER — Telehealth (INDEPENDENT_AMBULATORY_CARE_PROVIDER_SITE_OTHER): Payer: Self-pay

## 2021-05-29 ENCOUNTER — Other Ambulatory Visit (INDEPENDENT_AMBULATORY_CARE_PROVIDER_SITE_OTHER): Payer: Self-pay

## 2021-05-29 ENCOUNTER — Encounter (INDEPENDENT_AMBULATORY_CARE_PROVIDER_SITE_OTHER): Payer: Self-pay | Admitting: *Deleted

## 2021-05-29 ENCOUNTER — Encounter (INDEPENDENT_AMBULATORY_CARE_PROVIDER_SITE_OTHER): Payer: Self-pay

## 2021-05-29 DIAGNOSIS — Z1211 Encounter for screening for malignant neoplasm of colon: Secondary | ICD-10-CM

## 2021-05-29 DIAGNOSIS — Z800 Family history of malignant neoplasm of digestive organs: Secondary | ICD-10-CM

## 2021-05-29 MED ORDER — PEG 3350-KCL-NA BICARB-NACL 420 G PO SOLR: 4000.0000 mL | Freq: Once | ORAL | 0 refills | Status: AC

## 2021-05-29 NOTE — Telephone Encounter (Signed)
[place]

## 2021-05-29 NOTE — Telephone Encounter (Signed)
Referring MD/PCP: Mallie Snooks, Np  Procedure: Tcs  Reason/Indication:  screening and family hx of colon ca  Has patient had this procedure before?  No  If so, when, by whom and where?    Is there a family history of colon cancer?  Yes,   Who?  What age when diagnosed? Grandmother  Is patient diabetic? If yes, Type 1 or Type 2   No      Does patient have prosthetic heart valve or mechanical valve?  No  Do you have a pacemaker/defibrillator?  No  Has patient ever had endocarditis/atrial fibrillation? No  Does patient use oxygen? No  Has patient had joint replacement within last 12 months?  No  Is patient constipated or do they take laxatives? No  Does patient have a history of alcohol/drug use?  No  Have you had a stroke/heart attack last 6 mths? No  Do you take medicine for weight loss?  No  For female patients,: do you still have your menstrual cycle? No  Is patient on blood thinner such as Coumadin, Plavix and/or Aspirin? No  Medications: Gabapentin 600 mg bid, Wellbutrin, lisinopril 20 mg daily, trazodone 50 mg daily,Risperdal 1 mg daily,Estradiol 1 mg bid, Metoprolol 25 mg daily.  Allergies: Nkda  Procedure date & time: 07/04/2021 2:00 pm, patient told to be there at 1:00 pm.

## 2021-05-30 NOTE — Telephone Encounter (Signed)
error 

## 2021-06-07 NOTE — Telephone Encounter (Signed)
Ok to schedule.  Thanks,  Harvel Quale, MD Gastroenterology and Hepatology [facility]

## 2021-06-20 ENCOUNTER — Encounter (INDEPENDENT_AMBULATORY_CARE_PROVIDER_SITE_OTHER): Payer: Self-pay

## 2021-07-04 ENCOUNTER — Ambulatory Visit (HOSPITAL_COMMUNITY)
Admission: RE | Admit: 2021-07-04 | Discharge: 2021-07-04 | Disposition: A | Discharge status: Home / Self Care | Payer: BC Managed Care – PPO | Attending: Gastroenterology | Admitting: Gastroenterology

## 2021-07-04 ENCOUNTER — Ambulatory Visit (HOSPITAL_COMMUNITY): Payer: BC Managed Care – PPO | Admitting: Anesthesiology

## 2021-07-04 ENCOUNTER — Encounter (HOSPITAL_COMMUNITY)
Admission: RE | Disposition: A | Discharge status: Home / Self Care | Payer: Self-pay | Source: Home / Self Care | Attending: Gastroenterology

## 2021-07-04 ENCOUNTER — Other Ambulatory Visit: Payer: Self-pay

## 2021-07-04 ENCOUNTER — Encounter (INDEPENDENT_AMBULATORY_CARE_PROVIDER_SITE_OTHER): Payer: Self-pay | Admitting: *Deleted

## 2021-07-04 ENCOUNTER — Encounter (HOSPITAL_COMMUNITY): Payer: Self-pay | Admitting: Gastroenterology

## 2021-07-04 DIAGNOSIS — G4733 Obstructive sleep apnea (adult) (pediatric): Secondary | ICD-10-CM | POA: Diagnosis not present

## 2021-07-04 DIAGNOSIS — D123 Benign neoplasm of transverse colon: Secondary | ICD-10-CM | POA: Diagnosis not present

## 2021-07-04 DIAGNOSIS — Z1211 Encounter for screening for malignant neoplasm of colon: Secondary | ICD-10-CM | POA: Diagnosis not present

## 2021-07-04 DIAGNOSIS — E785 Hyperlipidemia, unspecified: Secondary | ICD-10-CM | POA: Diagnosis not present

## 2021-07-04 DIAGNOSIS — Z79899 Other long term (current) drug therapy: Secondary | ICD-10-CM | POA: Insufficient documentation

## 2021-07-04 DIAGNOSIS — K219 Gastro-esophageal reflux disease without esophagitis: Secondary | ICD-10-CM | POA: Insufficient documentation

## 2021-07-04 DIAGNOSIS — Z9884 Bariatric surgery status: Secondary | ICD-10-CM | POA: Diagnosis not present

## 2021-07-04 DIAGNOSIS — I10 Essential (primary) hypertension: Secondary | ICD-10-CM | POA: Diagnosis not present

## 2021-07-04 DIAGNOSIS — K648 Other hemorrhoids: Secondary | ICD-10-CM | POA: Diagnosis not present

## 2021-07-04 DIAGNOSIS — Z800 Family history of malignant neoplasm of digestive organs: Secondary | ICD-10-CM | POA: Insufficient documentation

## 2021-07-04 HISTORY — PX: COLONOSCOPY WITH PROPOFOL: SHX5780

## 2021-07-04 HISTORY — PX: POLYPECTOMY: SHX5525

## 2021-07-04 LAB — HM COLONOSCOPY

## 2021-07-04 SURGERY — COLONOSCOPY WITH PROPOFOL
Anesthesia: General

## 2021-07-04 MED ORDER — LACTATED RINGERS IV SOLN: INTRAVENOUS | Status: DC

## 2021-07-04 MED ORDER — PROPOFOL 500 MG/50ML IV EMUL
INTRAVENOUS | Status: DC | PRN
  Administered 2021-07-04: 150 ug/kg/min via INTRAVENOUS

## 2021-07-04 MED ORDER — STERILE WATER FOR IRRIGATION IR SOLN
Status: DC | PRN
  Administered 2021-07-04: 120 mL

## 2021-07-04 MED ORDER — PROPOFOL 10 MG/ML IV BOLUS
INTRAVENOUS | Status: DC | PRN
  Administered 2021-07-04: 100 mg via INTRAVENOUS
  Administered 2021-07-04 (×3): 50 mg via INTRAVENOUS

## 2021-07-04 MED ORDER — LIDOCAINE HCL (CARDIAC) PF 100 MG/5ML IV SOSY
PREFILLED_SYRINGE | INTRAVENOUS | Status: DC | PRN
  Administered 2021-07-04: 50 mg via INTRAVENOUS

## 2021-07-04 NOTE — H&P (Signed)
Theresa [name] is an 50 y.o. female.   Chief Complaint: Screening colonoscopy HPI: 50 year old female with past medical history of bipolar disorder, anxiety, attention deficit disorder, depression, GERD, hyperlipidemia, hypertension And sleep apnea, coming for screening colonoscopy. The patient has never had a colonoscopy in the past.  The patient denies having any complaints such as melena, hematochezia, abdominal distention, change in her bowel movement consistency or frequency, no changes in her weight recently.  Family history remarkable for grandmother with history of colon cancer.   Past Medical History:  Diagnosis Date   Anemia    Anxiety    Attention deficit disorder    Bipolar disorder ([place])    currently no meds- agitation noted during PAT assessment   Depression    GERD (gastroesophageal reflux disease)    resolved   H/O transfusion    17 yrs ago postpartum   Hyperlipidemia    resolved   Hypertension    history of HTN prior to gastric bypass   Hypoglycemia    Left breast abscess 01/22/2017   Sleep apnea, obstructive    cpap used auto settings, resolved gastric bypass    Past Surgical History:  Procedure Laterality Date   ABDOMINAL HYSTERECTOMY     ABDOMINOPLASTY     AUGMENTATION MAMMAPLASTY Bilateral 2016   BREAST BIOPSY Left 01/2017   abscess removed   CESAREAN SECTION     GASTRIC ROUX-EN-Y N/A 12/06/2013   Procedure: LAPAROSCOPIC ROUX-EN-Y GASTRIC BYPASS WITH UPPER ENDOSCOPY;  Surgeon: Gayland Curry, MD;  Location: [facility];  Service: General;  Laterality: N/A;   INCISION AND DRAINAGE ABSCESS Left 01/22/2017   Procedure: INCISION AND DRAINAGE LEFT  BREAST ABSCESS;  Surgeon: Fanny Skates, MD;  Location: [facility];  Service: General;  Laterality: Left;   LAPAROTOMY N/A 02/18/2017   Procedure: LAPAROTOMY;  Surgeon: Molli Posey, MD;  Location: [facility];  Service: Gynecology;  Laterality: N/A;   SALPINGOOPHORECTOMY Bilateral 02/18/2017   Procedure:  BILATERAL SALPINGO OOPHORECTOMY;  Surgeon: Molli Posey, MD;  Location: [facility];  Service: Gynecology;  Laterality: Bilateral;   TUBAL LIGATION     UNILATERAL SALPINGECTOMY Right 09/05/2015   Procedure: UNILATERAL SALPINGECTOMY;  Surgeon: Molli Posey, MD;  Location: [facility];  Service: Gynecology;  Laterality: Right;   VAGINAL HYSTERECTOMY N/A 09/05/2015   Procedure: HYSTERECTOMY VAGINAL;  Surgeon: Molli Posey, MD;  Location: [facility];  Service: Gynecology;  Laterality: N/A;   WISDOM TOOTH EXTRACTION      Family History  Problem Relation Age of Onset   Hypertension Mother    Cancer Mother 83       breast   Diabetes Mother    Hyperlipidemia Mother    Thyroid disease Mother    Breast cancer Mother 76   Depression Mother    Hypertension Father    Diabetes Father    Heart disease Father    Stroke Father    Depression Father    Bipolar disorder Father    Cancer Maternal Grandmother        Breast   Hyperlipidemia Maternal Grandmother    Heart disease Maternal Grandmother    Stroke Maternal Grandmother    Hypertension Maternal Grandmother    Breast cancer Maternal Grandmother 22   Hyperlipidemia Maternal Grandfather    Hypertension Maternal Grandfather    Cancer Paternal Grandmother        Colon   Hyperlipidemia Paternal Grandmother    Heart disease Paternal Grandmother    Hyperlipidemia Paternal Grandfather    Hypertension  Paternal Grandfather    Breast cancer Paternal Aunt    Social History:  reports that she has never smoked. She has never used smokeless tobacco. She reports current alcohol use. She reports that she does not use drugs.  Allergies:  Allergies  Allergen Reactions   Other Hives    Needs benadryl when given narcotics    Medications Prior to Admission  Medication Sig Dispense Refill   buPROPion (WELLBUTRIN SR) 150 MG 12 hr tablet TAKE 1 TABLET BY MOUTH TWICE A DAY 180 tablet 1   calcium citrate-vitamin D (CITRACAL+D) 315-200 MG-UNIT tablet Take 2  tablets by mouth 2 (two) times daily.     estradiol (ESTRACE) 1 MG tablet Take 1 tablet (1 mg total) by mouth 2 (two) times daily. 180 tablet 0   ferrous sulfate 325 (65 FE) MG EC tablet Take 325 mg by mouth 3 (three) times daily with meals.     gabapentin (NEURONTIN) 600 MG tablet TAKE 1 TABLET BY MOUTH IN THE MORNING TAKE 1 TABLET IN THE EVENING AND TAKE 2 TABLETS AT BEDTIME (Patient taking differently: Take 600-1,200 mg by mouth See admin instructions. TAKE 1 TABLET BY MOUTH IN THE MORNING AND TAKE 2 TABLETS AT BEDTIME) 120 tablet 3   lisinopril (ZESTRIL) 20 MG tablet Take 1 tablet (20 mg total) by mouth daily. 90 tablet 0   methylphenidate (RITALIN) 20 MG tablet Take 20 mg by mouth daily.   0   metoprolol succinate (TOPROL-XL) 25 MG 24 hr tablet TAKE 1 TABLET BY MOUTH EVERY DAY 90 tablet 0   traZODone (DESYREL) 100 MG tablet Take 100 mg by mouth at bedtime.     ALPRAZolam (XANAX) 0.5 MG tablet Take 0.5 mg by mouth daily as needed for anxiety.      No results found for this or any previous visit (from the past 48 hour(s)). No results found.  Review of Systems  Constitutional: Negative.   HENT: Negative.    Eyes: Negative.   Respiratory: Negative.    Cardiovascular: Negative.   Gastrointestinal: Negative.   Endocrine: Negative.   Genitourinary: Negative.   Musculoskeletal: Negative.   Skin: Negative.   Allergic/Immunologic: Negative.   Neurological: Negative.   Hematological: Negative.   Psychiatric/Behavioral: Negative.     Blood pressure (!) 154/87, pulse 67, temperature 98.6 F (37 C), temperature source Oral, resp. rate 17, height 5' 4"  (1.626 m), weight 71.7 kg, last menstrual period 10/24/2014, SpO2 99 %. Physical Exam  GENERAL: The patient is AO x3, in no acute distress. HEENT: Head is normocephalic and atraumatic. EOMI are intact. Mouth is well hydrated and without lesions. NECK: Supple. No masses LUNGS: Clear to auscultation. No presence of rhonchi/wheezing/rales.  Adequate chest expansion HEART: RRR, normal s1 and s2. ABDOMEN: Soft, nontender, no guarding, no peritoneal signs, and nondistended. BS +. No masses. EXTREMITIES: Without any cyanosis, clubbing, rash, lesions or edema. NEUROLOGIC: AOx3, no focal motor deficit. SKIN: no jaundice, no rashes   Assessment/Plan 50 year old female with past medical history of bipolar disorder, anxiety, attention deficit disorder, depression, GERD, hyperlipidemia, hypertension And sleep apnea, coming for screening colonoscopy.The patient is at average risk for colorectal cancer.  We will proceed with colonoscopy today.   Harvel Quale, MD 07/04/2021, 11:50 AM

## 2021-07-04 NOTE — Transfer of Care (Signed)
Immediate Anesthesia Transfer of Care Note  Patient: Theresa [name]  Procedure(s) Performed: COLONOSCOPY WITH PROPOFOL POLYPECTOMY  Patient Location: Endoscopy Unit  Anesthesia Type:General  Level of Consciousness: drowsy  Airway & Oxygen Therapy: Patient Spontanous Breathing  Post-op Assessment: Report given to RN and Post -op Vital signs reviewed and stable  Post vital signs: Reviewed and stable  Last Vitals:  Vitals Value Taken Time  BP    Temp    Pulse    Resp    SpO2      Last Pain:  Vitals:   07/04/21 1242  TempSrc:   PainSc: 10-Worst pain ever      Patients Stated Pain Goal: 8 (65/99/78 7765)  Complications: No notable events documented.

## 2021-07-04 NOTE — Op Note (Signed)
[facility] Patient Name: Theresa [name] Procedure Date: 07/04/2021 12:30 PM MRN: [id number] Date of Birth: [birth year] Attending MD: [name] ,  CSN: [id number] Age: 50 Admit Type: Outpatient Procedure:                Colonoscopy Indications:              Screening for colorectal malignant neoplasm Providers:                [name], Herbert Pun,                            Technician Referring MD:              Medicines:                Monitored Anesthesia Care Complications:            No immediate complications. Estimated Blood Loss:     Estimated blood loss: none. Procedure:                Pre-Anesthesia Assessment:                           - Prior to the procedure, a History and Physical                            was performed, and patient medications, allergies                            and sensitivities were reviewed. The patient's                            tolerance of previous anesthesia was reviewed.                           - The risks and benefits of the procedure and the                            sedation options and risks were discussed with the                            patient. All questions were answered and informed                            consent was obtained.                           - ASA Grade Assessment: II - A patient with mild                            systemic disease.                           After obtaining informed consent, the colonoscope                            was passed under direct vision. Throughout the  procedure, the patient's blood pressure, pulse, and                            oxygen saturations were monitored continuously. The                            [id number]-HQ190L ([id number]) scope was introduced through                            the anus and advanced to the the cecum, identified                            by appendiceal orifice and ileocecal valve. The                             colonoscopy was performed without difficulty. The                            patient tolerated the procedure well. The quality                            of the bowel preparation was adequate to identify                            polyps 6 mm and larger in size. Scope In: 12:45:48 PM Scope Out: 1:18:19 PM Scope Withdrawal Time: 0 hours 27 minutes 43 seconds  Total Procedure Duration: 0 hours 32 minutes 31 seconds  Findings:      The perianal and digital rectal examinations were normal.      A 5 mm polyp was found in the transverse colon. The polyp was sessile.       The polyp was removed with a cold snare. Resection was complete, but the       polyp tissue was not retrieved.      Non-bleeding internal hemorrhoids were found during retroflexion. The       hemorrhoids were small. Impression:               - One 5 mm polyp in the transverse colon, removed                            with a cold snare. Complete resection. Polyp tissue                            not retrieved.                           - Non-bleeding internal hemorrhoids. Moderate Sedation:      Per Anesthesia Care Recommendation:           - Discharge patient to home (ambulatory).                           - Resume previous diet.                           - Repeat colonoscopy in 3  years for surveillance                            due to prep quality, will need a two day prep. Procedure Code(s):        --- Professional ---                           [id number], Colonoscopy, flexible; with removal of                            tumor(s), polyp(s), or other lesion(s) by snare                            technique Diagnosis Code(s):        --- Professional ---                           Z12.11, Encounter for screening for malignant                            neoplasm of colon                           K63.5, Polyp of colon                           K64.8, Other hemorrhoids CPT copyright 2019 American Medical  Association. All rights reserved. The codes documented in this report are preliminary and upon coder review may  be revised to meet current compliance requirements. [name], MD [name],  07/04/2021 1:23:00 PM This report has been signed electronically. Number of Addenda: 0

## 2021-07-04 NOTE — Anesthesia Postprocedure Evaluation (Signed)
Anesthesia Post Note  Patient: Theresa [name]  Procedure(s) Performed: COLONOSCOPY WITH PROPOFOL POLYPECTOMY  Patient location during evaluation: Endoscopy Anesthesia Type: General Level of consciousness: awake and alert and oriented Pain management: pain level controlled Vital Signs Assessment: post-procedure vital signs reviewed and stable Respiratory status: spontaneous breathing, nonlabored ventilation and respiratory function stable Cardiovascular status: blood pressure returned to baseline and stable Postop Assessment: no apparent nausea or vomiting Anesthetic complications: no   No notable events documented.   Last Vitals:  Vitals:   07/04/21 1100 07/04/21 1321  BP: (!) 154/87 112/68  Pulse:    Resp:  13  Temp:  36.4 C  SpO2:  98%    Last Pain:  Vitals:   07/04/21 1326  TempSrc:   PainSc: 10-Worst pain ever                 Signe Tackitt C Anjuli Gemmill

## 2021-07-04 NOTE — Discharge Instructions (Addendum)
You are being discharged to home.  Resume your previous diet.  Your physician has recommended a repeat colonoscopy in three years for surveillance due to prep quality, will need a two day prep.

## 2021-07-04 NOTE — Anesthesia Procedure Notes (Signed)
Date/Time: 07/04/2021 12:46 PM Performed by: Orlie Dakin, CRNA Pre-anesthesia Checklist: Patient identified, Emergency Drugs available, Patient being monitored and Suction available Patient Re-evaluated:Patient Re-evaluated prior to induction Oxygen Delivery Method: Nasal cannula Induction Type: IV induction Placement Confirmation: positive ETCO2

## 2021-07-04 NOTE — Anesthesia Preprocedure Evaluation (Addendum)
Anesthesia Evaluation  Patient identified by MRN, date of birth, ID band Patient awake    Reviewed: Allergy & Precautions, NPO status , Patient's Chart, lab work & pertinent test results, reviewed documented beta blocker date and time   Airway Mallampati: II  TM Distance: >3 FB Neck ROM: Full    Dental  (+) Dental Advisory Given, Teeth Intact   Pulmonary sleep apnea (resolved after gastric bypass) ,    Pulmonary exam normal breath sounds clear to auscultation       Cardiovascular Exercise Tolerance: Good hypertension, Pt. on medications and Pt. on home beta blockers Normal cardiovascular exam Rhythm:Regular Rate:Normal     Neuro/Psych PSYCHIATRIC DISORDERS Anxiety Depression Bipolar Disorder negative neurological ROS     GI/Hepatic GERD  Controlled,(+) Hepatitis -  Endo/Other  negative endocrine ROS  Renal/GU negative Renal ROS  negative genitourinary   Musculoskeletal negative musculoskeletal ROS (+)   Abdominal   Peds negative pediatric ROS (+)  Hematology  (+) Blood dyscrasia, anemia ,   Anesthesia Other Findings   Reproductive/Obstetrics negative OB ROS                            Anesthesia Physical Anesthesia Plan  ASA: 2  Anesthesia Plan: General   Post-op Pain Management:    Induction: Intravenous  PONV Risk Score and Plan: TIVA  Airway Management Planned: Nasal Cannula and Natural Airway  Additional Equipment:   Intra-op Plan:   Post-operative Plan:   Informed Consent: I have reviewed the patients History and Physical, chart, labs and discussed the procedure including the risks, benefits and alternatives for the proposed anesthesia with the patient or authorized representative who has indicated his/her understanding and acceptance.     Dental advisory given  Plan Discussed with: CRNA and Surgeon  Anesthesia Plan Comments:         Anesthesia Quick  Evaluation

## 2021-07-05 LAB — SURGICAL PATHOLOGY

## 2021-07-06 ENCOUNTER — Encounter (HOSPITAL_COMMUNITY): Payer: Self-pay | Admitting: Gastroenterology

## 2021-08-27 ENCOUNTER — Other Ambulatory Visit (HOSPITAL_COMMUNITY): Payer: Self-pay | Admitting: Family Medicine

## 2021-08-27 DIAGNOSIS — Z1231 Encounter for screening mammogram for malignant neoplasm of breast: Secondary | ICD-10-CM

## 2021-11-28 ENCOUNTER — Emergency Department (HOSPITAL_COMMUNITY): Payer: BC Managed Care – PPO

## 2021-11-28 ENCOUNTER — Other Ambulatory Visit: Payer: Self-pay

## 2021-11-28 ENCOUNTER — Emergency Department (HOSPITAL_COMMUNITY)
Admission: EM | Admit: 2021-11-28 | Discharge: 2021-11-28 | Disposition: A | Discharge status: Home / Self Care | Payer: BC Managed Care – PPO | Attending: Emergency Medicine | Admitting: Emergency Medicine

## 2021-11-28 DIAGNOSIS — R739 Hyperglycemia, unspecified: Secondary | ICD-10-CM | POA: Insufficient documentation

## 2021-11-28 DIAGNOSIS — R1030 Lower abdominal pain, unspecified: Secondary | ICD-10-CM | POA: Insufficient documentation

## 2021-11-28 DIAGNOSIS — D72819 Decreased white blood cell count, unspecified: Secondary | ICD-10-CM | POA: Diagnosis not present

## 2021-11-28 DIAGNOSIS — R17 Unspecified jaundice: Secondary | ICD-10-CM | POA: Diagnosis not present

## 2021-11-28 DIAGNOSIS — R519 Headache, unspecified: Secondary | ICD-10-CM | POA: Insufficient documentation

## 2021-11-28 DIAGNOSIS — R7401 Elevation of levels of liver transaminase levels: Secondary | ICD-10-CM | POA: Diagnosis not present

## 2021-11-28 DIAGNOSIS — Z7982 Long term (current) use of aspirin: Secondary | ICD-10-CM | POA: Insufficient documentation

## 2021-11-28 LAB — URINALYSIS, ROUTINE W REFLEX MICROSCOPIC
Bilirubin Urine: NEGATIVE
Glucose, UA: 150 mg/dL — AB
Hgb urine dipstick: NEGATIVE
Ketones, ur: NEGATIVE mg/dL
Leukocytes,Ua: NEGATIVE
Nitrite: NEGATIVE
Protein, ur: NEGATIVE mg/dL
Specific Gravity, Urine: 1.004 — ABNORMAL LOW (ref 1.005–1.030)
pH: 6 (ref 5.0–8.0)

## 2021-11-28 LAB — COMPREHENSIVE METABOLIC PANEL
ALT: 564 U/L — ABNORMAL HIGH (ref 0–44)
AST: 378 U/L — ABNORMAL HIGH (ref 15–41)
Albumin: 3.3 g/dL — ABNORMAL LOW (ref 3.5–5.0)
Alkaline Phosphatase: 106 U/L (ref 38–126)
Anion gap: 6 (ref 5–15)
BUN: 10 mg/dL (ref 6–20)
CO2: 25 mmol/L (ref 22–32)
Calcium: 8.9 mg/dL (ref 8.9–10.3)
Chloride: 105 mmol/L (ref 98–111)
Creatinine, Ser: 1.07 mg/dL — ABNORMAL HIGH (ref 0.44–1.00)
GFR, Estimated: >60 mL/min (ref >60)
Glucose, Bld: 199 mg/dL — ABNORMAL HIGH (ref 70–99)
Potassium: 4.4 mmol/L (ref 3.5–5.1)
Sodium: 136 mmol/L (ref 135–145)
Total Bilirubin: 2.3 mg/dL — ABNORMAL HIGH (ref 0.3–1.2)
Total Protein: 5.9 g/dL — ABNORMAL LOW (ref 6.5–8.1)

## 2021-11-28 LAB — CBC
HCT: 41.7 % (ref 36.0–46.0)
Hemoglobin: 13.4 g/dL (ref 12.0–15.0)
MCH: 28.9 pg (ref 26.0–34.0)
MCHC: 32.1 g/dL (ref 30.0–36.0)
MCV: 89.9 fL (ref 80.0–100.0)
Platelets: 141 10*3/uL — ABNORMAL LOW (ref 150–400)
RBC: 4.64 MIL/uL (ref 3.87–5.11)
RDW: 14.5 % (ref 11.5–15.5)
WBC: 2.7 10*3/uL — ABNORMAL LOW (ref 4.0–10.5)
nRBC: 0 % (ref 0.0–0.2)

## 2021-11-28 LAB — ACETAMINOPHEN LEVEL: Acetaminophen (Tylenol), Serum: <10 ug/mL — ABNORMAL LOW (ref 10–30)

## 2021-11-28 LAB — I-STAT BETA HCG BLOOD, ED (MC, WL, AP ONLY): I-stat hCG, quantitative: <5 m[IU]/mL (ref <5)

## 2021-11-28 LAB — LIPASE, BLOOD: Lipase: 39 U/L (ref 11–51)

## 2021-11-28 IMAGING — CT CT ABD-PELV W/ CM
2 of 5 series · 16 of 46 positions shown, 18 images · IV contrast (agent unspecified)
Comparison: [DATE]

CLINICAL DATA: Abdominal pain

EXAM:
CT ABDOMEN AND PELVIS WITH CONTRAST
TECHNIQUE: Multidetector CT imaging of the abdomen and pelvis was performed
using the standard protocol following bolus administration of
intravenous contrast.

[Series 3: a/p w/ 5mm · axial · 0.92mm/px · z∈[+761,+1196]mm · 13 of 97 slices shown, 15 images]
[im 5/97  soft-tissue]
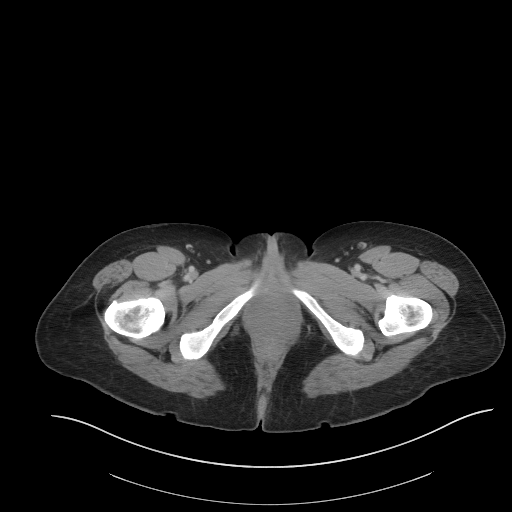
[im 5/97  bone]
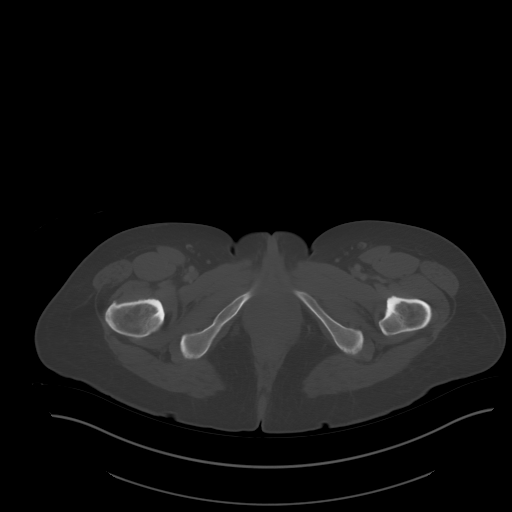
[im 15/97  soft-tissue]
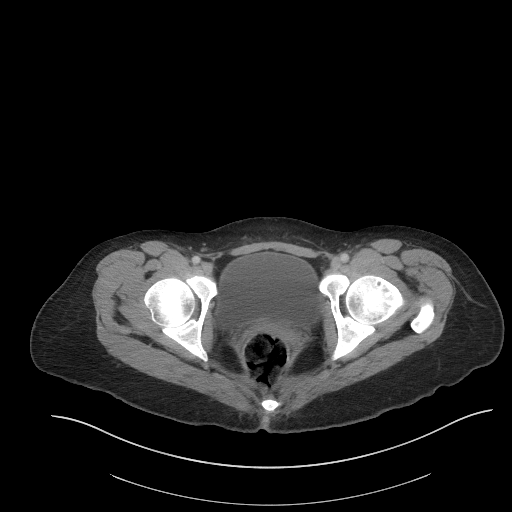
[im 20/97  soft-tissue]
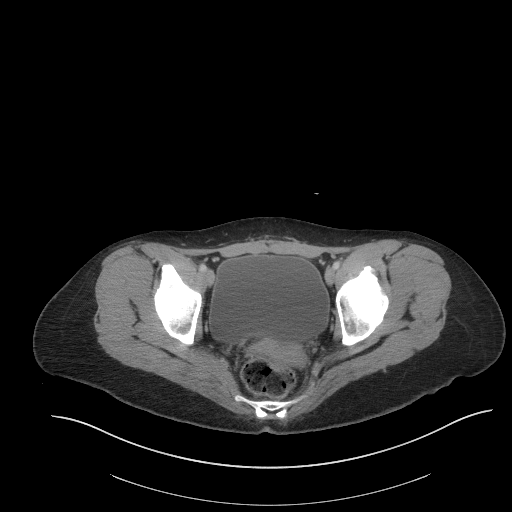
[im 29/97  soft-tissue]
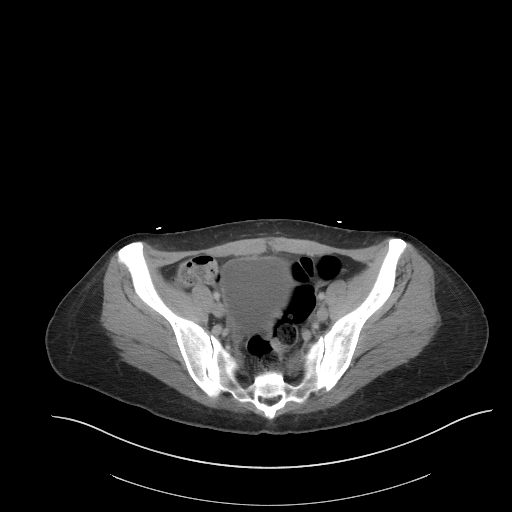
[im 34/97  soft-tissue]
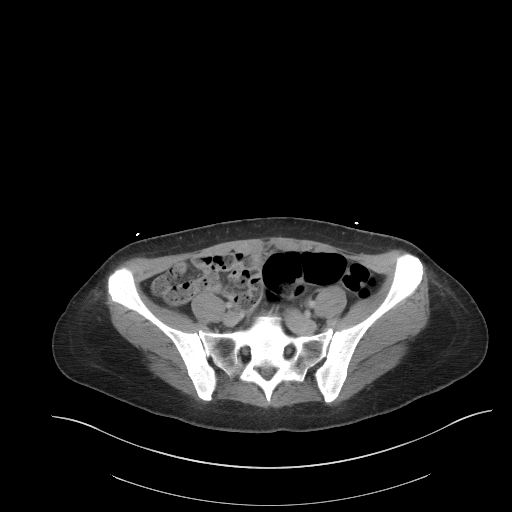
[im 44/97  soft-tissue]
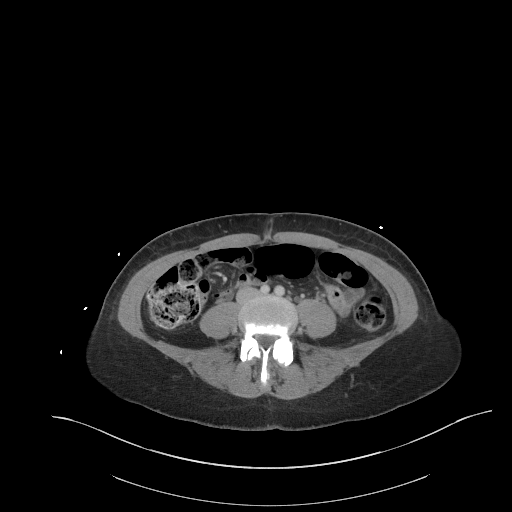
[im 49/97  soft-tissue]
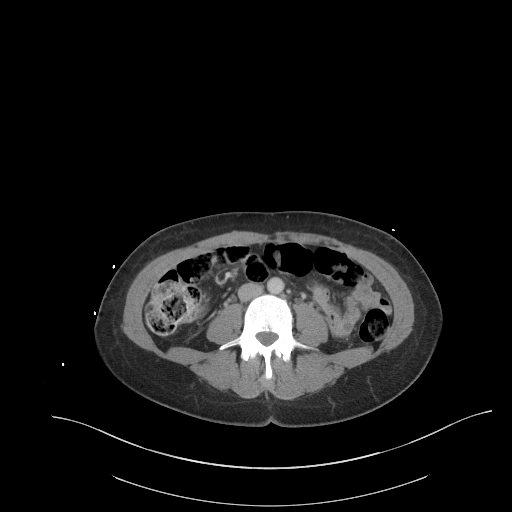
[im 53/97  soft-tissue]
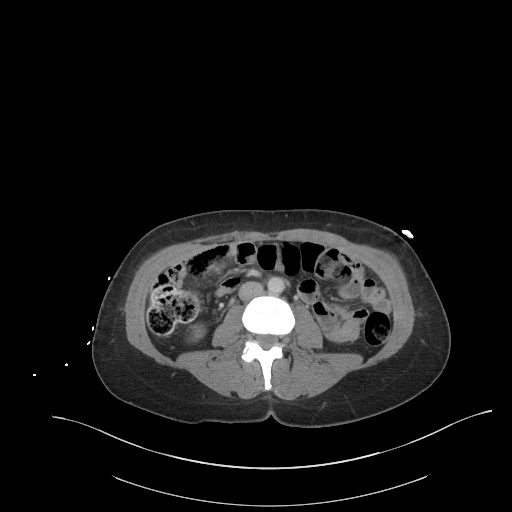
[im 63/97  soft-tissue]
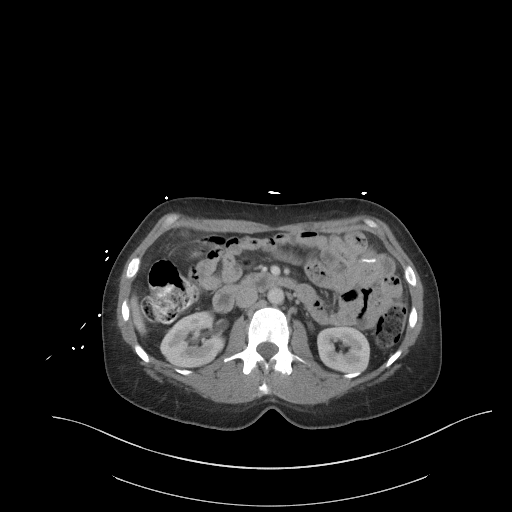
[im 63/97  bone]
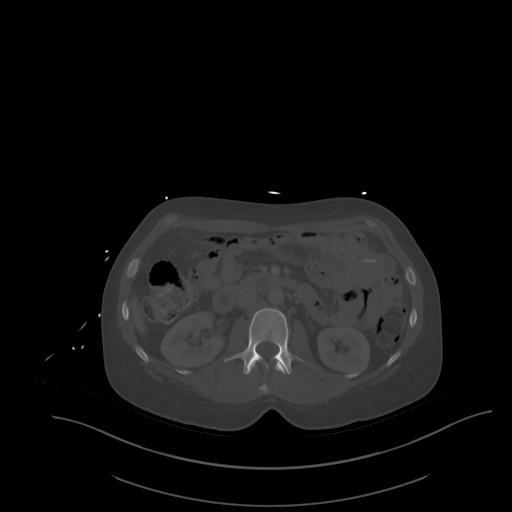
[im 68/97  soft-tissue]
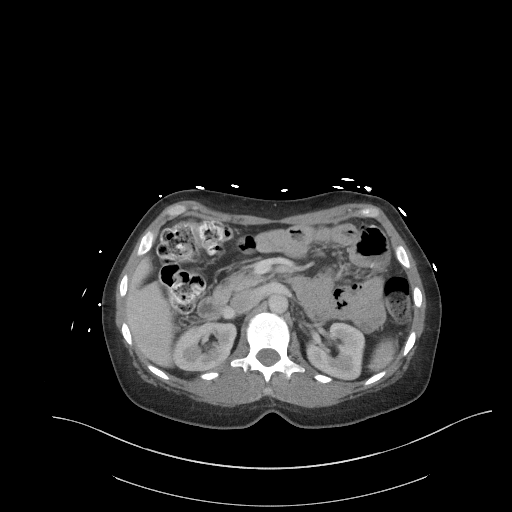
[im 77/97  soft-tissue]
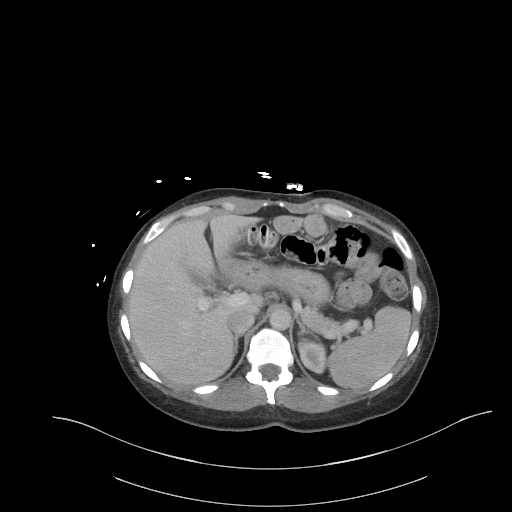
[im 82/97  soft-tissue]
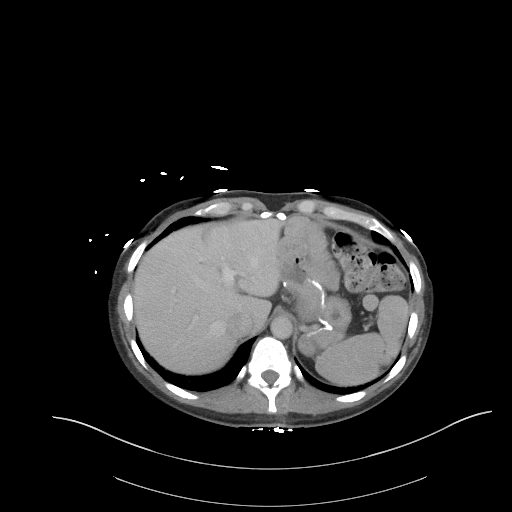
[im 92/97  soft-tissue]
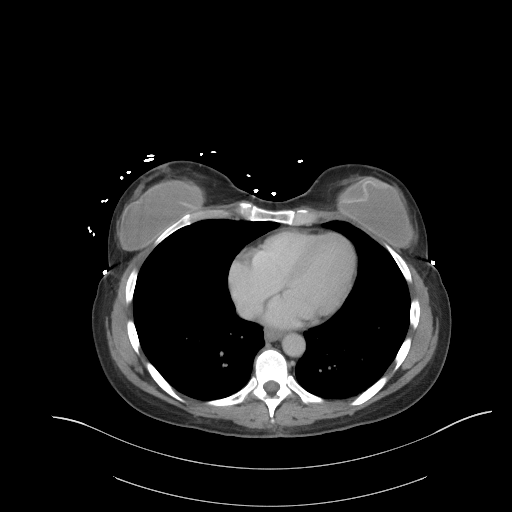

[Series 6: a/p w/ cor · coronal · 0.81mm/px · 3 of 150 slices shown]
[im 50/150  soft-tissue]
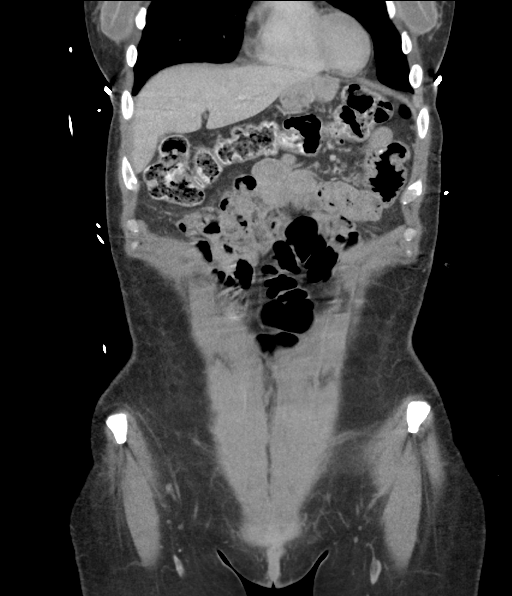
[im 67/150  soft-tissue]
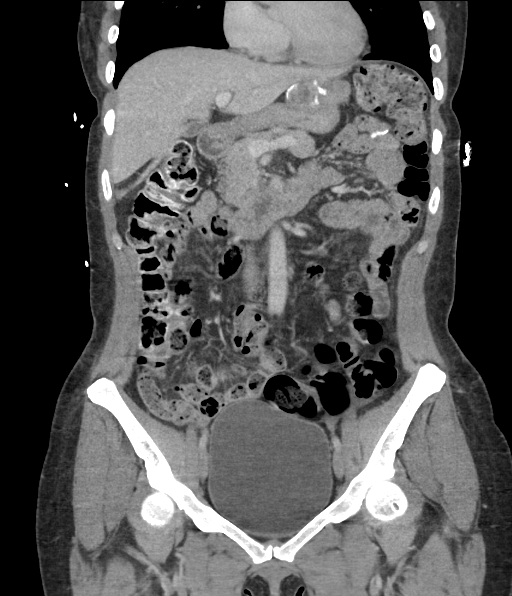
[im 83/150  soft-tissue]
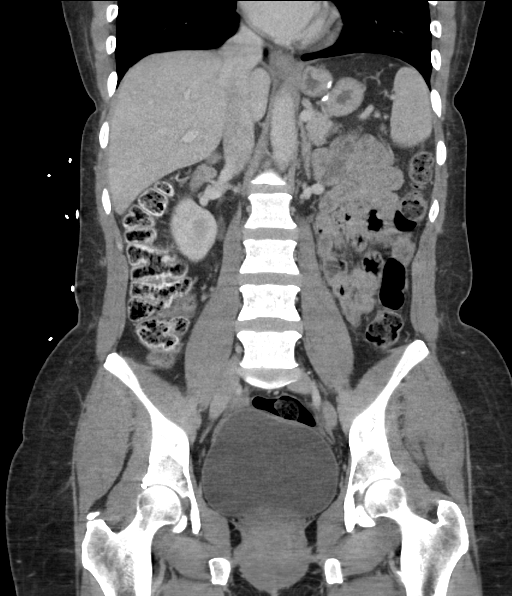

[16 of 46 positions shown; findings below may reference images not displayed]

RADIATION DOSE REDUCTION: This exam was performed according to the
departmental dose-optimization program which includes automated
exposure control, adjustment of the mA and/or kV according to
patient size and/or use of iterative reconstruction technique.

CONTRAST:  80mL OMNIPAQUE IOHEXOL 300 MG/ML  SOLN
FINDINGS: Lower chest: Augmentation/reconstruction prostheses are seen in both
breasts. Visualized lower lung fields are clear.

Hepatobiliary: No significant focal abnormality is seen. Gallbladder
is not distended. There is no dilation of bile ducts.

Pancreas: No focal abnormality is seen.

Spleen: Unremarkable.

Adrenals/Urinary Tract: Adrenals are unremarkable. There is no
hydronephrosis. There are no ureteral stones. Urinary bladder is
unremarkable.

Stomach/Bowel: Surgical staples are seen in the stomach suggesting
previous bariatric surgery. Surgical staples are seen in the
jejunum. There is no significant small bowel dilation. Appendix is
not dilated. There is no significant wall thickening in colon. There
is no pericolic stranding.

Vascular/Lymphatic: Unremarkable.

Reproductive: Uterus is not seen.

Other: There is no ascites or pneumoperitoneum.

Musculoskeletal: Unremarkable.
IMPRESSION: There is no evidence of intestinal obstruction or pneumoperitoneum.
There is no hydronephrosis. Appendix is not dilated.

Other findings as described in the body of the report.

## 2021-11-28 IMAGING — CT CT HEAD W/O CM
4 series · 15 of 47 positions shown, 17 images · non-contrast
Comparison: [DATE]

CLINICAL DATA: Headaches



[Series 2: head without · axial · non-contrast · 0.47mm/px · z∈[+1530,+1650]mm · 7 of 32 slices shown, 9 images]
[im 4/32  brain]
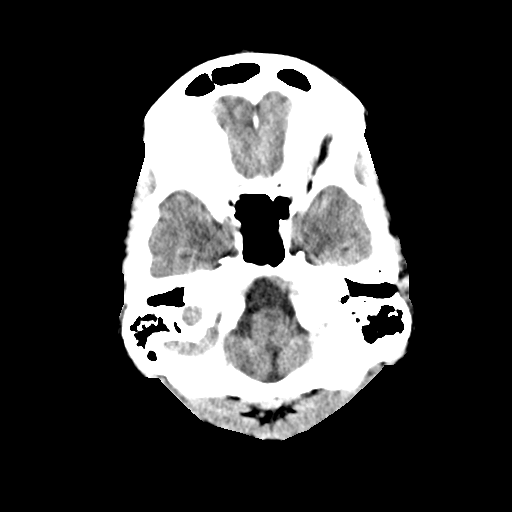
[im 4/32  bone]
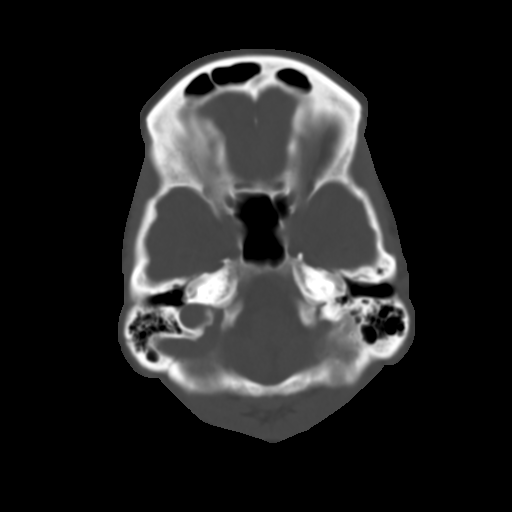
[im 8/32  brain]
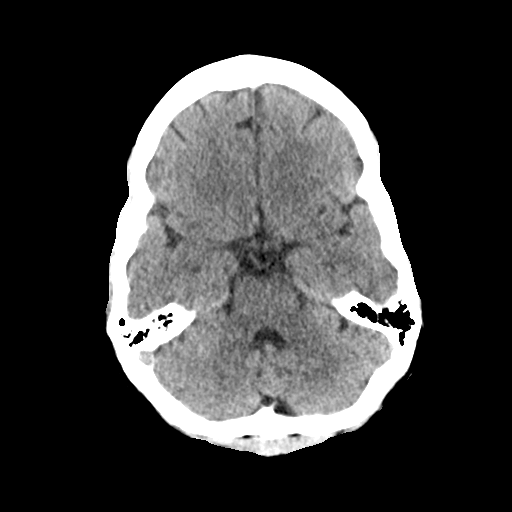
[im 12/32  brain]
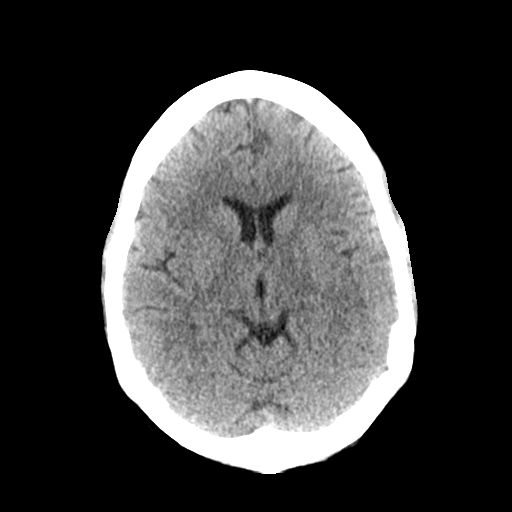
[im 16/32  brain]
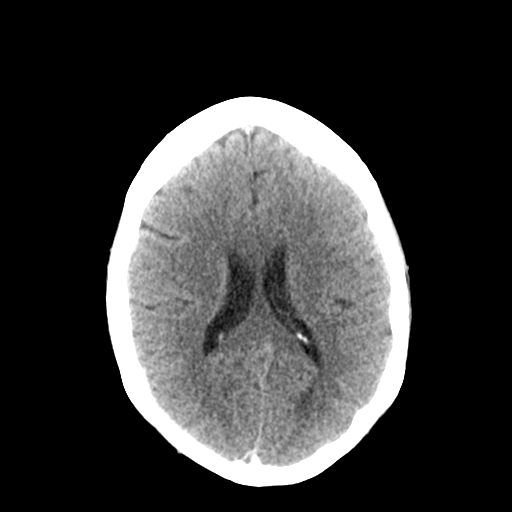
[im 20/32  brain]
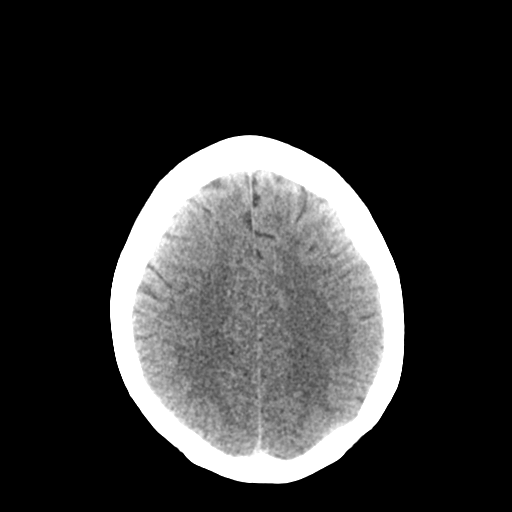
[im 20/32  bone]
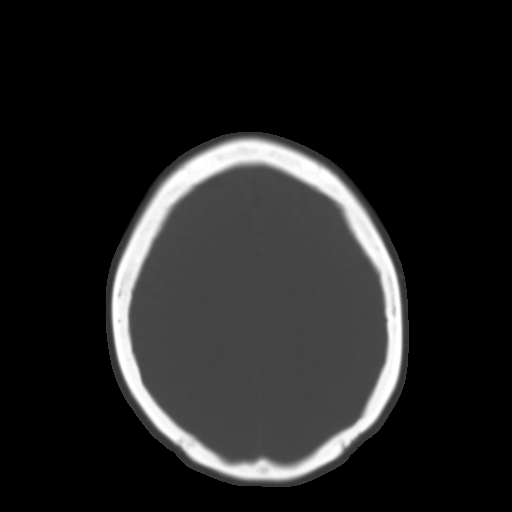
[im 24/32  brain]
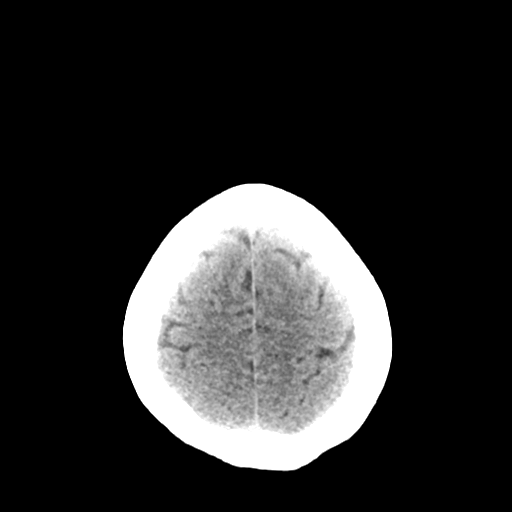
[im 28/32  brain]
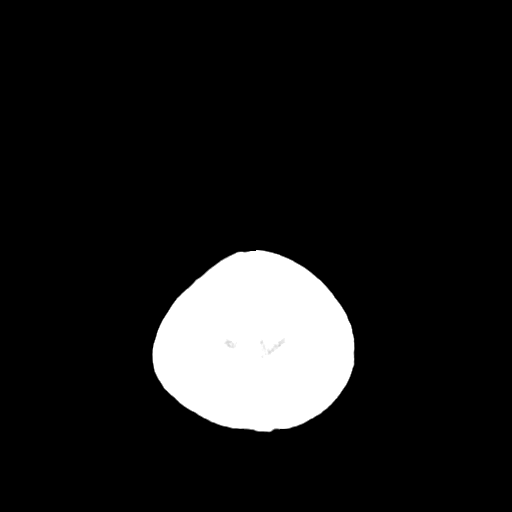

[Series 3: head bone · axial · 0.47mm/px · z∈[+1529,+1545]mm · 2 of 78 slices shown]
[im 8/78  bone]
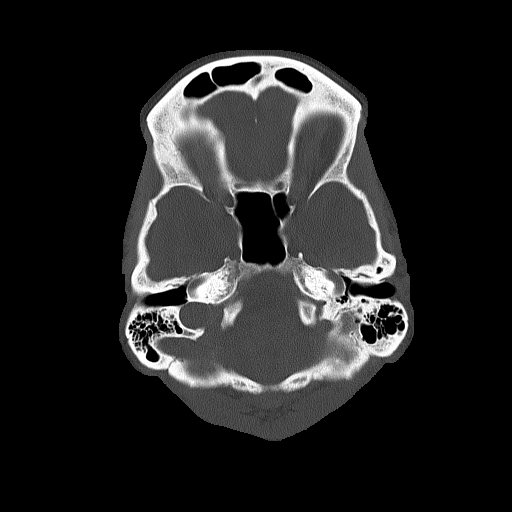
[im 16/78  bone]
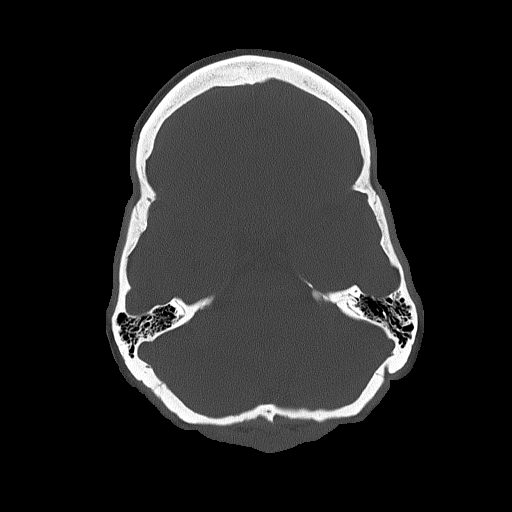

[Series 4: head without cor · coronal · non-contrast · 0.30mm/px · 3 of 77 slices shown]
[im 26/77  brain]
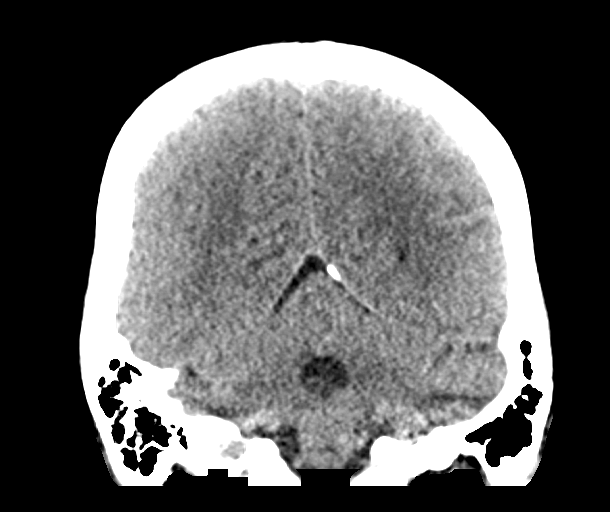
[im 34/77  brain]
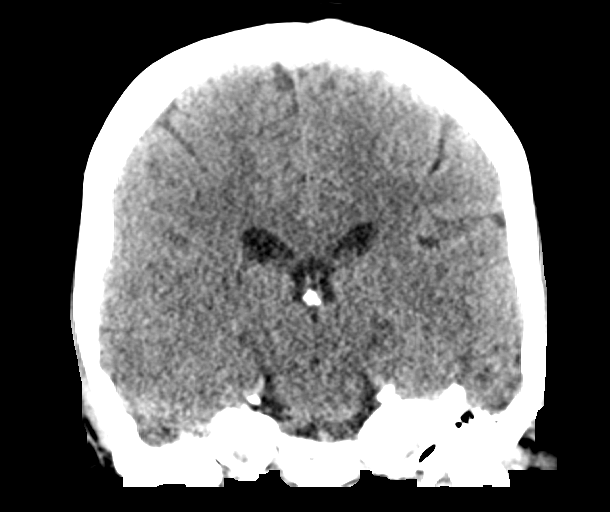
[im 43/77  brain]
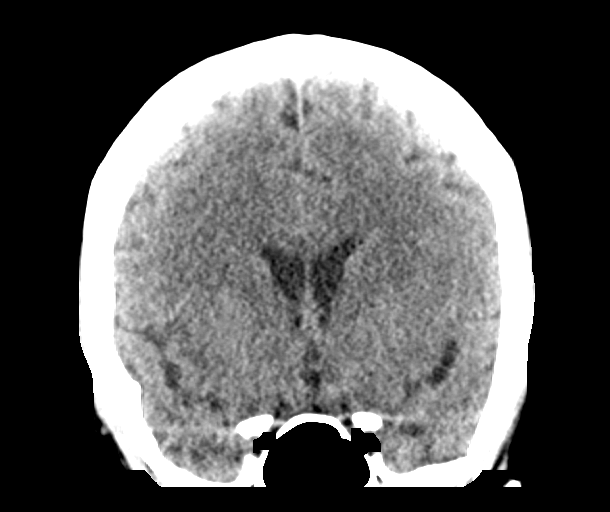

[Series 5: head without sag · sagittal · non-contrast · 0.30mm/px · 3 of 63 slices shown]
[im 21/63  brain]
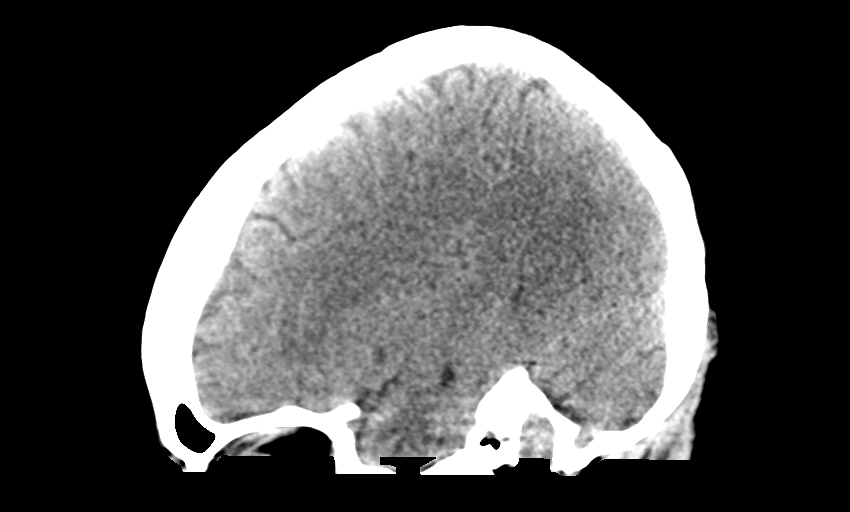
[im 32/63  brain]
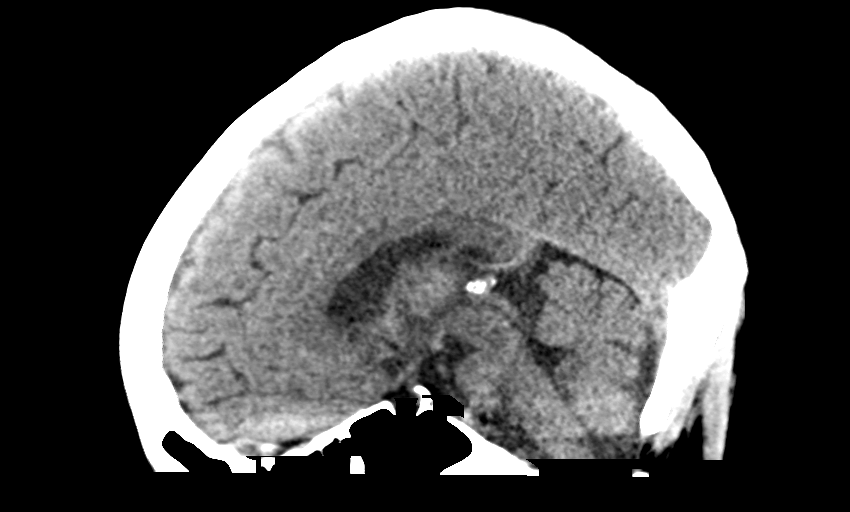
[im 42/63  brain]
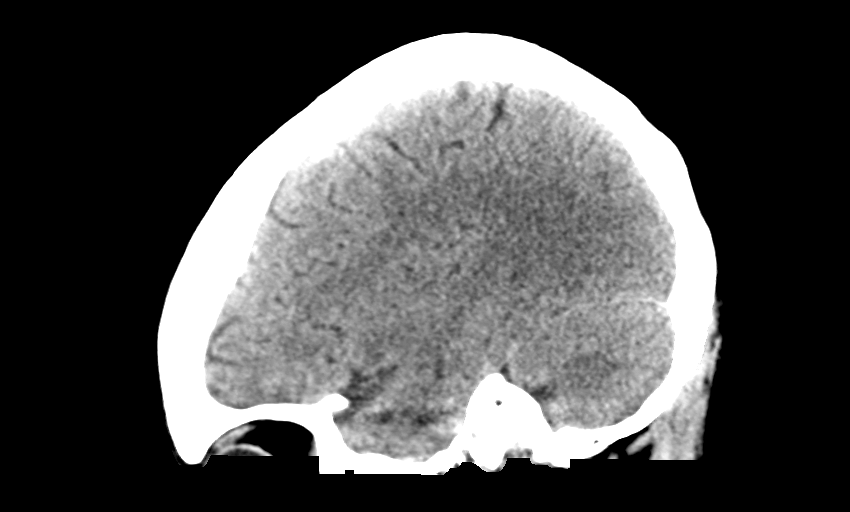

[15 of 47 positions shown; findings below may reference images not displayed]

FINDINGS: Brain: No acute intracranial findings are seen. Ventricles are not
dilated. There is no shift of midline structures. There are no
epidural or subdural fluid collections.

Vascular: Unremarkable

Skull: Unremarkable.

Sinuses/Orbits: Unremarkable.

Other: None
IMPRESSION: No acute intracranial findings are seen in noncontrast CT brain.

## 2021-11-28 MED ORDER — IOHEXOL 300 MG/ML  SOLN
80.0000 mL | Freq: Once | INTRAMUSCULAR | Status: AC | PRN
  Administered 2021-11-28: 80 mL via INTRAVENOUS

## 2021-11-28 MED ORDER — PROCHLORPERAZINE EDISYLATE 10 MG/2ML IJ SOLN
10.0000 mg | Freq: Once | INTRAMUSCULAR | Status: AC
  Administered 2021-11-28: 10 mg via INTRAVENOUS
  Filled 2021-11-28: qty 2

## 2021-11-28 MED ORDER — DIPHENHYDRAMINE HCL 50 MG/ML IJ SOLN
25.0000 mg | Freq: Once | INTRAMUSCULAR | Status: AC
  Administered 2021-11-28: 25 mg via INTRAVENOUS
  Filled 2021-11-28: qty 1

## 2021-11-28 MED ORDER — SODIUM CHLORIDE 0.9 % IV BOLUS
1000.0000 mL | Freq: Once | INTRAVENOUS | Status: AC
  Administered 2021-11-28: 1000 mL via INTRAVENOUS

## 2021-11-28 MED ORDER — PANTOPRAZOLE SODIUM 20 MG PO TBEC: 20.0000 mg | DELAYED_RELEASE_TABLET | Freq: Every day | ORAL | 0 refills | Status: DC

## 2021-11-28 NOTE — ED Triage Notes (Signed)
Pt. Stated, Ive had abdominal and a migraine headache with some decrease in urinary output. This started a week ago. ?

## 2021-11-28 NOTE — Discharge Instructions (Signed)
You can take ibuprofen as needed for headaches like we discussed.  I would stop taking Tylenol until you follow-up with GI.  I will also give you a prescription for Protonix please take it 30 to 60 minutes before you ingest any food morning.  Please return to the emergency department for any worsening symptoms you might have. ?

## 2021-11-28 NOTE — ED Provider Notes (Signed)
?[facility] ?Provider Note ? ? ?CSN: [id number] ?Arrival date & time: 11/28/21  1001 ? ?  ? ?History ?Chief Complaint  ?Patient presents with  ? Abdominal Pain  ? Migraine  ? ? ?Theresa [name] is a 51 y.o. female with history of migraines, gastric bypass, and hysterectomy who presents to the emergency department with headache, dizziness, and lower abdominal pain has been ongoing for the past week.  Patient states she ran out of her estradiol a day prior to her symptoms starting.  Normally when this happens and she restarts her estradiol her symptoms resolved however this time it is gotten worse.  Patient has a longstanding history of migraines however has not had 1 in some time this particular migraine is the worst when she is ever had.  Patient denies any nausea, vomiting, diarrhea.  No fever or chills.  No dysuria or urinary frequency.  She does report decreased urinary output. ? ? ?Abdominal Pain ?Migraine ?Associated symptoms include abdominal pain.  ? ?  ? ?Home Medications ?Prior to Admission medications   ?Medication Sig Start Date End Date Taking? Authorizing Provider  ?ALPRAZolam (XANAX) 0.5 MG tablet Take 0.5 mg by mouth daily as needed for anxiety. 06/09/19  Yes [provider]  ?aspirin-acetaminophen-caffeine (EXCEDRIN MIGRAINE) [phone] MG tablet Take 2 tablets by mouth every 6 (six) hours as needed for headache or migraine.   Yes [provider]  ?buPROPion (WELLBUTRIN SR) 150 MG 12 hr tablet TAKE 1 TABLET BY MOUTH TWICE A DAY 01/02/20  Yes Malfi, Lupita Raider, FNP  ?estradiol (ESTRACE) 1 MG tablet Take 1 tablet (1 mg total) by mouth 2 (two) times daily. 05/11/20  Yes Malfi, Lupita Raider, FNP  ?gabapentin (NEURONTIN) 600 MG tablet TAKE 1 TABLET BY MOUTH IN THE MORNING TAKE 1 TABLET IN THE EVENING AND TAKE 2 TABLETS AT BEDTIME ?Patient taking differently: Take 600-1,200 mg by mouth See admin instructions. TAKE 1 TABLET BY MOUTH IN THE MORNING AND TAKE 2  TABLETS AT BEDTIME 01/17/20  Yes Malfi, Lupita Raider, FNP  ?ibuprofen (ADVIL) 200 MG tablet Take 400 mg by mouth every 6 (six) hours as needed for headache.   Yes [provider]  ?lisinopril (ZESTRIL) 20 MG tablet Take 1 tablet (20 mg total) by mouth daily. 03/02/21  Yes Jearld Fenton, NP  ?methylphenidate (RITALIN) 20 MG tablet Take 20 mg by mouth daily as needed (Focus). 06/23/17  Yes [provider]  ?metoprolol succinate (TOPROL-XL) 25 MG 24 hr tablet TAKE 1 TABLET BY MOUTH EVERY DAY 04/01/20  Yes Malfi, Lupita Raider, FNP  ?pantoprazole (PROTONIX) 20 MG tablet Take 1 tablet (20 mg total) by mouth daily. 11/28/21  Yes Raul Del, Mariadelaluz Guggenheim M, PA-C  ?traZODone (DESYREL) 100 MG tablet Take 100 mg by mouth at bedtime.   Yes [provider]  ?calcium citrate-vitamin D (CITRACAL+D) 315-200 MG-UNIT tablet Take 2 tablets by mouth 2 (two) times daily. ?Patient not taking: Reported on 11/28/2021    [provider]  ?ferrous sulfate 325 (65 FE) MG EC tablet Take 325 mg by mouth 3 (three) times daily with meals. ?Patient not taking: Reported on 11/28/2021    [provider]  ?   ? ?Allergies    ?Other   ? ?Review of Systems   ?Review of Systems  ?Gastrointestinal:  Positive for abdominal pain.  ?All other systems reviewed and are negative. ? ?Physical Exam ?Updated Vital Signs ?BP 138/84   Pulse 70   Temp 97.7 ?F (  36.5 ?C) (Oral)   Resp (!) 24   LMP 10/24/2014 (Approximate)   SpO2 100%  ?Physical Exam ?Vitals and nursing note reviewed.  ?Constitutional:   ?   General: She is not in acute distress. ?   Appearance: Normal appearance.  ?HENT:  ?   Head: Normocephalic and atraumatic.  ?Eyes:  ?   General:     ?   Right eye: No discharge.     ?   Left eye: No discharge.  ?Cardiovascular:  ?   Comments: Regular rate and rhythm.  S1/S2 are distinct without any evidence of murmur, rubs, or gallops.  Radial pulses are 2+ bilaterally.  Dorsalis pedis pulses are 2+ bilaterally.  No evidence of pedal  edema. ?Pulmonary:  ?   Comments: Clear to auscultation bilaterally.  Normal effort.  No respiratory distress.  No evidence of wheezes, rales, or rhonchi heard throughout. ?Abdominal:  ?   General: Abdomen is flat. Bowel sounds are normal. There is no distension.  ?   Tenderness: There is no guarding or rebound.  ?   Comments: Minimal suprapubic tenderness to deep palpation.  ?Musculoskeletal:     ?   General: Normal range of motion.  ?   Cervical back: Neck supple.  ?Skin: ?   General: Skin is warm and dry.  ?   Findings: No rash.  ?Neurological:  ?   General: No focal deficit present.  ?   Mental Status: She is alert.  ?   Comments: Cranial nerves II to XII are intact.  Pupils are equal round reactive to light.  5/5 strength to the upper and lower extremities.  Normal sensation to the upper and lower extremities.  No nystagmus with extraocular movements.  ?Psychiatric:     ?   Mood and Affect: Mood normal.     ?   Behavior: Behavior normal.  ? ? ?ED Results / Procedures / Treatments   ?Labs ?(all labs ordered are listed, but only abnormal results are displayed) ?Labs Reviewed  ?COMPREHENSIVE METABOLIC PANEL - Abnormal; Notable for the following components:  ?    Result Value  ? Glucose, Bld 199 (*)   ? Creatinine, Ser 1.07 (*)   ? Total Protein 5.9 (*)   ? Albumin 3.3 (*)   ? AST 378 (*)   ? ALT 564 (*)   ? Total Bilirubin 2.3 (*)   ? All other components within normal limits  ?CBC - Abnormal; Notable for the following components:  ? WBC 2.7 (*)   ? Platelets 141 (*)   ? All other components within normal limits  ?URINALYSIS, ROUTINE W REFLEX MICROSCOPIC - Abnormal; Notable for the following components:  ? Specific Gravity, Urine 1.004 (*)   ? Glucose, UA 150 (*)   ? All other components within normal limits  ?ACETAMINOPHEN LEVEL - Abnormal; Notable for the following components:  ? Acetaminophen (Tylenol), Serum <10 (*)   ? All other components within normal limits  ?URINE CULTURE  ?LIPASE, BLOOD  ?I-STAT BETA HCG  BLOOD, ED ([facility], [facility], [facility] ONLY)  ? ? ?EKG ?None ? ?Radiology ?CT Head Wo Contrast ? ?Result Date: 11/28/2021 ?CLINICAL DATA:  Headaches EXAM: CT HEAD WITHOUT CONTRAST TECHNIQUE: Contiguous axial images were obtained from the base of the skull through the vertex without intravenous contrast. RADIATION DOSE REDUCTION: This exam was performed according to the departmental dose-optimization program which includes automated exposure control, adjustment of the mA and/or kV according to patient size and/or use of iterative  reconstruction technique. COMPARISON:  09/03/2006 FINDINGS: Brain: No acute intracranial findings are seen. Ventricles are not dilated. There is no shift of midline structures. There are no epidural or subdural fluid collections. Vascular: Unremarkable Skull: Unremarkable. Sinuses/Orbits: Unremarkable. Other: None IMPRESSION: No acute intracranial findings are seen in noncontrast CT brain. Electronically Signed   By: Elmer Picker M.D.   On: 11/28/2021 12:36  ? ?CT ABDOMEN PELVIS W CONTRAST ? ?Result Date: 11/28/2021 ?CLINICAL DATA:  Abdominal pain EXAM: CT ABDOMEN AND PELVIS WITH CONTRAST TECHNIQUE: Multidetector CT imaging of the abdomen and pelvis was performed using the standard protocol following bolus administration of intravenous contrast. RADIATION DOSE REDUCTION: This exam was performed according to the departmental dose-optimization program which includes automated exposure control, adjustment of the mA and/or kV according to patient size and/or use of iterative reconstruction technique. CONTRAST:  2m OMNIPAQUE IOHEXOL 300 MG/ML  SOLN COMPARISON:  06/26/2017 FINDINGS: Lower chest: Augmentation/reconstruction prostheses are seen in both breasts. Visualized lower lung fields are clear. Hepatobiliary: No significant focal abnormality is seen. Gallbladder is not distended. There is no dilation of bile ducts. Pancreas: No focal abnormality is seen. Spleen: Unremarkable. Adrenals/Urinary Tract:  Adrenals are unremarkable. There is no hydronephrosis. There are no ureteral stones. Urinary bladder is unremarkable. Stomach/Bowel: Surgical staples are seen in the stomach suggesting previous bariatric surge

## 2021-11-28 NOTE — ED Notes (Signed)
Got patient into a gown on the monitor patient is resting with call bell in reach and family at bedside ?

## 2021-11-29 LAB — URINE CULTURE: Culture: NO GROWTH

## 2021-12-24 ENCOUNTER — Encounter (INDEPENDENT_AMBULATORY_CARE_PROVIDER_SITE_OTHER): Payer: Self-pay | Admitting: Gastroenterology

## 2021-12-24 ENCOUNTER — Ambulatory Visit (INDEPENDENT_AMBULATORY_CARE_PROVIDER_SITE_OTHER): Payer: BC Managed Care – PPO | Admitting: Gastroenterology

## 2021-12-24 VITALS — BP 100/66 | HR 82 | Temp 98.2°F | Ht 65.0 in | Wt 149.8 lb

## 2021-12-24 DIAGNOSIS — K5909 Other constipation: Secondary | ICD-10-CM

## 2021-12-24 DIAGNOSIS — R7989 Other specified abnormal findings of blood chemistry: Secondary | ICD-10-CM | POA: Insufficient documentation

## 2021-12-24 DIAGNOSIS — K5904 Chronic idiopathic constipation: Secondary | ICD-10-CM

## 2021-12-24 DIAGNOSIS — R112 Nausea with vomiting, unspecified: Secondary | ICD-10-CM | POA: Insufficient documentation

## 2021-12-24 DIAGNOSIS — K5900 Constipation, unspecified: Secondary | ICD-10-CM | POA: Insufficient documentation

## 2021-12-24 DIAGNOSIS — K219 Gastro-esophageal reflux disease without esophagitis: Secondary | ICD-10-CM

## 2021-12-24 MED ORDER — LUBIPROSTONE 8 MCG PO CAPS: 8.0000 ug | ORAL_CAPSULE | Freq: Two times a day (BID) | ORAL | 1 refills | Status: DC

## 2021-12-24 MED ORDER — PANTOPRAZOLE SODIUM 40 MG PO TBEC: 40.0000 mg | DELAYED_RELEASE_TABLET | Freq: Every day | ORAL | 3 refills | Status: DC

## 2021-12-24 NOTE — Patient Instructions (Signed)
Schedule EGD  ?Perform blood workup ?Start Amitiza 8 mcg twice a day ?Increase Protonix 40 mg qday ?Explained presumed etiology of reflux symptoms. Instruction provided in the use of antireflux medication - patient should take medication in the morning 30-45 minutes before eating breakfast. Discussed avoidance of eating within 2 hours of lying down to sleep and benefit of blocks to elevate head of bed. Also, will benefit from avoiding carbonated drinks/sodas or food that has tomatoes, spicy or greasy food. ? ?

## 2021-12-24 NOTE — Progress Notes (Signed)
Theresa [name], M.D. ?Gastroenterology & Hepatology ?[facility] ?[address] ?[place], [place] [address] ? ?Primary Care Physician: ?Tilda Burrow, NP ?[address] [address] ? ?I will communicate my assessment and recommendations to the referring MD via EMR. ? ?Problems: ?Elevated LFTs ?Persistent nausea and vomiting ? ?History of Present Illness: ?Theresa [name] is a 51 y.o. female with past medical history of bipolar disorder, anxiety, attention deficit disorder, depression, obesity s/p gastric bypass, GERD, hyperlipidemia, hypertension and sleep apnea, coming to the clinic for evaluation of persistent nausea and vomiting, as well as elevated LFTs. ? ?The patient was last seen on 07/04/2021 for screening colonoscopy.   ? ?She states that for close to a month she has presented  recurrent episodes of nausea and vomiting.  She reported that she was feeling nauseated every day and had intermittent episodes of vomiting that made her very concerned.  States more recently the vomiting has improved on its own. Tried phernegan in the past but made her sleepy without decreasing the nausea. Believes she had some Zofran in the ER at [facility] (described below). She also presented episodes of burning sensation in her lower abdomen which migrates through her abdomen diffusely, but has also presented episodes of heartburn. ? ?She reports that her constipation has been worse recently but has presented worsening of her symptoms. She takes a stool softener daily and OTC laxative (unknown type) every 3-4 days. Has a BM usually once a week. Has also presented significant bloating. ? ?Patient was seen by her PCP and was advised to go to the ER as she had elevated bilirubin. In the ER at [facility] on 11/28/2021 after presenting acute onset of lower abdominal pain and headache.  Blood work-up at that point showed AST of 378, ALT of 564, total  bilirubin of 2.3, lipase of 39, alkaline phosphatase of 106, CBC showed white blood cell count of 2.7, hemoglobin of 13.4 and platelets of 141.  She underwent a CT of the abdomen and pelvis with IV contrast in the same day which was unremarkable, but I identified significant amount of stool in the rectum.  Also had a CT of the head without IV contrast which was unremarkable.  She was discharged on pantoprazole 20 mg every day. She feels her symptoms have somewhat improved with Protonix. However, her appetite has been decreased with the recent events. ? ?Denies starting any new medication at the time her symptoms started. No sick contacts. ? ?Notably, prior to this her most recent blood work-up from 2021 showed normal liver function tests. ? ?The patient denies having any fever, chills, hematochezia, melena, hematemesis, diarrhea, jaundice, pruritus or weight loss. ? ?Last EGD: never ?Last Colonoscopy: 07/04/2021, 5 mm polyp in transverse colon removed with a cold snare (consistent with SSL) ?Repeat colonoscopy in 3 years due to quality of the prep ? ?Past Medical History: ?Past Medical History:  ?Diagnosis Date  ? Anemia   ? Anxiety   ? Attention deficit disorder   ? Bipolar disorder ([place])   ? currently no meds- agitation noted during PAT assessment  ? Depression   ? GERD (gastroesophageal reflux disease)   ? resolved  ? H/O transfusion   ? 17 yrs ago postpartum  ? Hyperlipidemia   ? resolved  ? Hypertension   ? history of HTN prior to gastric bypass  ? Hypoglycemia   ? Left breast abscess 01/22/2017  ? Sleep apnea, obstructive   ?  cpap used auto settings, resolved gastric bypass  ? ? ?Past Surgical History: ?Past Surgical History:  ?Procedure Laterality Date  ? ABDOMINAL HYSTERECTOMY    ? ABDOMINOPLASTY    ? AUGMENTATION MAMMAPLASTY Bilateral 2016  ? BREAST BIOPSY Left 01/2017  ? abscess removed  ? CESAREAN SECTION    ? COLONOSCOPY WITH PROPOFOL N/A 07/04/2021  ? Procedure: COLONOSCOPY WITH PROPOFOL;  Surgeon:  Harvel Quale, MD;  [facility];  Service: Gastroenterology;  Laterality: N/A;  2:00 pm  ? GASTRIC ROUX-EN-Y N/A 12/06/2013  ? Procedure: LAPAROSCOPIC ROUX-EN-Y GASTRIC BYPASS WITH UPPER ENDOSCOPY;  Surgeon: Gayland Curry, MD;  Location: [facility];  Service: General;  Laterality: N/A;  ? INCISION AND DRAINAGE ABSCESS Left 01/22/2017  ? Procedure: INCISION AND DRAINAGE LEFT  BREAST ABSCESS;  Surgeon: Fanny Skates, MD;  Location: [facility];  Service: General;  Laterality: Left;  ? LAPAROTOMY N/A 02/18/2017  ? Procedure: LAPAROTOMY;  Surgeon: Molli Posey, MD;  Location: [facility];  Service: Gynecology;  Laterality: N/A;  ? POLYPECTOMY  07/04/2021  ? Procedure: POLYPECTOMY;  Surgeon: Harvel Quale, MD;  Location: [facility];  Service: Gastroenterology;;  ? SALPINGOOPHORECTOMY Bilateral 02/18/2017  ? Procedure: BILATERAL SALPINGO OOPHORECTOMY;  Surgeon: Molli Posey, MD;  Location: [facility];  Service: Gynecology;  Laterality: Bilateral;  ? TUBAL LIGATION    ? UNILATERAL SALPINGECTOMY Right 09/05/2015  ? Procedure: UNILATERAL SALPINGECTOMY;  Surgeon: Molli Posey, MD;  Location: [facility];  Service: Gynecology;  Laterality: Right;  ? VAGINAL HYSTERECTOMY N/A 09/05/2015  ? Procedure: HYSTERECTOMY VAGINAL;  Surgeon: Molli Posey, MD;  Location: [facility];  Service: Gynecology;  Laterality: N/A;  ? WISDOM TOOTH EXTRACTION    ? ? ?Family History: ?Family History  ?Problem Relation Age of Onset  ? Hypertension Mother   ? Cancer Mother 80  ?     breast  ? Diabetes Mother   ? Hyperlipidemia Mother   ? Thyroid disease Mother   ? Breast cancer Mother 15  ? Depression Mother   ? Hypertension Father   ? Diabetes Father   ? Heart disease Father   ? Stroke Father   ? Depression Father   ? Bipolar disorder Father   ? Cancer Maternal Grandmother   ?     Breast  ? Hyperlipidemia Maternal Grandmother   ? Heart disease Maternal Grandmother   ? Stroke Maternal Grandmother   ?  Hypertension Maternal Grandmother   ? Breast cancer Maternal Grandmother 41  ? Hyperlipidemia Maternal Grandfather   ? Hypertension Maternal Grandfather   ? Cancer Paternal Grandmother   ?     Colon  ? Hyperlipidemia Paternal Grandmother   ? Heart disease Paternal Grandmother   ? Hyperlipidemia Paternal Grandfather   ? Hypertension Paternal Grandfather   ? Breast cancer Paternal Aunt   ? ? ?Social History: ?Social History  ? ?Tobacco Use  ?Smoking Status Never  ?Smokeless Tobacco Never  ? ?Social History  ? ?Substance and Sexual Activity  ?Alcohol Use Yes  ? Comment: rare-occ. monthly  ? ?Social History  ? ?Substance and Sexual Activity  ?Drug Use No  ? ? ?Allergies: ?Allergies  ?Allergen Reactions  ? Other Hives  ?  Needs benadryl when given narcotics  ? ? ?Medications: ?Current Outpatient Medications  ?Medication Sig Dispense Refill  ? ALPRAZolam (XANAX) 0.5 MG tablet Take 0.5 mg by mouth daily as needed for anxiety.    ? buPROPion (WELLBUTRIN SR) 150 MG 12 hr tablet TAKE 1 TABLET  BY MOUTH TWICE A DAY (Patient taking differently: Take 150 mg by mouth daily.) 180 tablet 1  ? estradiol (ESTRACE) 1 MG tablet Take 1 tablet (1 mg total) by mouth 2 (two) times daily. 180 tablet 0  ? ferrous sulfate 325 (65 FE) MG EC tablet Take 325 mg by mouth daily with breakfast.    ? gabapentin (NEURONTIN) 600 MG tablet TAKE 1 TABLET BY MOUTH IN THE MORNING TAKE 1 TABLET IN THE EVENING AND TAKE 2 TABLETS AT BEDTIME (Patient taking differently: Take 600-1,200 mg by mouth See admin instructions. TAKE 1 TABLET BY MOUTH IN THE MORNING AND TAKE 2 TABLETS AT BEDTIME) 120 tablet 3  ? ibuprofen (ADVIL) 200 MG tablet Take 400 mg by mouth every 6 (six) hours as needed for headache.    ? lisinopril (ZESTRIL) 20 MG tablet Take 1 tablet (20 mg total) by mouth daily. 90 tablet 0  ? methylphenidate (RITALIN) 20 MG tablet Take 20 mg by mouth daily as needed (Focus).  0  ? metoprolol succinate (TOPROL-XL) 25 MG 24 hr tablet TAKE 1 TABLET BY MOUTH  EVERY DAY 90 tablet 0  ? pantoprazole (PROTONIX) 20 MG tablet Take 1 tablet (20 mg total) by mouth daily. 30 tablet 0  ? traZODone (DESYREL) 100 MG tablet Take 100 mg by mouth at bedtime.    ? ?No current facility-administered

## 2021-12-25 LAB — CBC WITH DIFFERENTIAL/PLATELET
Absolute Monocytes: 447 cells/uL (ref 200–950)
Basophils Absolute: 29 cells/uL (ref 0–200)
Basophils Relative: 0.7 %
Eosinophils Absolute: 62 cells/uL (ref 15–500)
Eosinophils Relative: 1.5 %
HCT: 40.8 % (ref 35.0–45.0)
Hemoglobin: 13.7 g/dL (ref 11.7–15.5)
Lymphs Abs: 1271 cells/uL (ref 850–3900)
MCH: 29.6 pg (ref 27.0–33.0)
MCHC: 33.6 g/dL (ref 32.0–36.0)
MCV: 88.1 fL (ref 80.0–100.0)
MPV: 10.5 fL (ref 7.5–12.5)
Monocytes Relative: 10.9 %
Neutro Abs: 2292 cells/uL (ref 1500–7800)
Neutrophils Relative %: 55.9 %
Platelets: 178 10*3/uL (ref 140–400)
RBC: 4.63 10*6/uL (ref 3.80–5.10)
RDW: 13.3 % (ref 11.0–15.0)
Total Lymphocyte: 31 %
WBC: 4.1 10*3/uL (ref 3.8–10.8)

## 2021-12-25 LAB — COMPREHENSIVE METABOLIC PANEL
AG Ratio: 1.7 (calc) (ref 1.0–2.5)
ALT: 559 U/L — ABNORMAL HIGH (ref 6–29)
AST: 319 U/L — ABNORMAL HIGH (ref 10–35)
Albumin: 4 g/dL (ref 3.6–5.1)
Alkaline phosphatase (APISO): 137 U/L — ABNORMAL HIGH (ref 31–125)
BUN/Creatinine Ratio: 12 (calc) (ref 6–22)
BUN: 12 mg/dL (ref 7–25)
CO2: 30 mmol/L (ref 20–32)
Calcium: 9.6 mg/dL (ref 8.6–10.2)
Chloride: 101 mmol/L (ref 98–110)
Creat: 1.01 mg/dL — ABNORMAL HIGH (ref 0.50–0.99)
Globulin: 2.4 g/dL (calc) (ref 1.9–3.7)
Glucose, Bld: 83 mg/dL (ref 65–99)
Potassium: 4.7 mmol/L (ref 3.5–5.3)
Sodium: 138 mmol/L (ref 135–146)
Total Bilirubin: 4.1 mg/dL — ABNORMAL HIGH (ref 0.2–1.2)
Total Protein: 6.4 g/dL (ref 6.1–8.1)

## 2021-12-25 LAB — PROTIME-INR
INR: 1.1
Prothrombin Time: 11.4 s (ref 9.0–11.5)

## 2021-12-26 ENCOUNTER — Other Ambulatory Visit (INDEPENDENT_AMBULATORY_CARE_PROVIDER_SITE_OTHER): Payer: Self-pay | Admitting: Gastroenterology

## 2021-12-26 DIAGNOSIS — R7989 Other specified abnormal findings of blood chemistry: Secondary | ICD-10-CM

## 2021-12-26 NOTE — Addendum Note (Signed)
Addended by: Harvel Quale on: 12/26/2021 03:23 PM ? ? Modules accepted: Orders ? ?

## 2022-01-06 ENCOUNTER — Encounter (INDEPENDENT_AMBULATORY_CARE_PROVIDER_SITE_OTHER): Payer: Self-pay | Admitting: Gastroenterology

## 2022-01-07 LAB — ANTI-SMOOTH MUSCLE ANTIBODY, IGG: Actin (Smooth Muscle) Antibody (IGG): <20 U (ref <20)

## 2022-01-07 LAB — ANA: Anti Nuclear Antibody (ANA): POSITIVE — AB

## 2022-01-07 LAB — CERULOPLASMIN: Ceruloplasmin: 21 mg/dL (ref 18–53)

## 2022-01-07 LAB — ANTI-NUCLEAR AB-TITER (ANA TITER): ANA Titer 1: 1:80 {titer} — ABNORMAL HIGH

## 2022-01-07 LAB — IGG: IgG (Immunoglobin G), Serum: 984 mg/dL (ref 600–1640)

## 2022-01-07 LAB — HEPATITIS PANEL, ACUTE
Hep A IgM: NONREACTIVE
Hep B C IgM: NONREACTIVE
Hepatitis B Surface Ag: NONREACTIVE
Hepatitis C Ab: NONREACTIVE
SIGNAL TO CUT-OFF: 0.07 (ref <1.00)

## 2022-01-07 LAB — HCV RNA, QUANT REAL-TIME PCR W/REFLEX
HCV RNA, PCR, QN (Log): <1.18 LogIU/mL
HCV RNA, PCR, QN: <15 IU/mL

## 2022-01-07 LAB — HEPATITIS B DNA, ULTRAQUANTITATIVE, PCR
Hepatitis B DNA (Calc): <1 Log IU/mL
Hepatitis B DNA: <10 IU/mL

## 2022-01-07 LAB — IRON, TOTAL/TOTAL IRON BINDING CAP
%SAT: 30 % (calc) (ref 16–45)
Iron: 121 ug/dL (ref 40–190)
TIBC: 398 mcg/dL (calc) (ref 250–450)

## 2022-01-07 LAB — FERRITIN: Ferritin: 55 ng/mL (ref 16–232)

## 2022-01-09 ENCOUNTER — Encounter (INDEPENDENT_AMBULATORY_CARE_PROVIDER_SITE_OTHER): Payer: Self-pay

## 2022-01-09 ENCOUNTER — Other Ambulatory Visit (INDEPENDENT_AMBULATORY_CARE_PROVIDER_SITE_OTHER): Payer: Self-pay

## 2022-01-09 ENCOUNTER — Other Ambulatory Visit: Payer: Self-pay | Admitting: Neurology

## 2022-01-09 ENCOUNTER — Encounter (INDEPENDENT_AMBULATORY_CARE_PROVIDER_SITE_OTHER): Payer: Self-pay | Admitting: Gastroenterology

## 2022-01-09 ENCOUNTER — Other Ambulatory Visit (HOSPITAL_COMMUNITY): Payer: Self-pay | Admitting: Neurology

## 2022-01-09 DIAGNOSIS — R12 Heartburn: Secondary | ICD-10-CM

## 2022-01-09 DIAGNOSIS — R42 Dizziness and giddiness: Secondary | ICD-10-CM

## 2022-01-09 DIAGNOSIS — R112 Nausea with vomiting, unspecified: Secondary | ICD-10-CM

## 2022-01-09 DIAGNOSIS — R296 Repeated falls: Secondary | ICD-10-CM

## 2022-01-09 DIAGNOSIS — R7989 Other specified abnormal findings of blood chemistry: Secondary | ICD-10-CM

## 2022-01-18 ENCOUNTER — Encounter (INDEPENDENT_AMBULATORY_CARE_PROVIDER_SITE_OTHER): Payer: Self-pay | Admitting: Gastroenterology

## 2022-01-18 NOTE — Telephone Encounter (Signed)
Patient aware there are no providers here today and I asked if she would call her pcp to see if he would help her. She states she will do that and call back on Monday if not doing better.  ?

## 2022-01-22 ENCOUNTER — Other Ambulatory Visit (INDEPENDENT_AMBULATORY_CARE_PROVIDER_SITE_OTHER): Payer: Self-pay

## 2022-01-24 ENCOUNTER — Other Ambulatory Visit (INDEPENDENT_AMBULATORY_CARE_PROVIDER_SITE_OTHER): Payer: Self-pay | Admitting: Gastroenterology

## 2022-01-24 ENCOUNTER — Ambulatory Visit (HOSPITAL_COMMUNITY)
Admission: RE | Admit: 2022-01-24 | Discharge: 2022-01-24 | Disposition: A | Discharge status: Home / Self Care | Payer: BC Managed Care – PPO | Source: Ambulatory Visit | Attending: Neurology | Admitting: Neurology

## 2022-01-24 ENCOUNTER — Ambulatory Visit (HOSPITAL_COMMUNITY)
Admission: RE | Admit: 2022-01-24 | Discharge: 2022-01-24 | Disposition: A | Discharge status: Home / Self Care | Payer: BC Managed Care – PPO | Source: Ambulatory Visit | Attending: Gastroenterology | Admitting: Gastroenterology

## 2022-01-24 DIAGNOSIS — R42 Dizziness and giddiness: Secondary | ICD-10-CM | POA: Diagnosis present

## 2022-01-24 DIAGNOSIS — R7989 Other specified abnormal findings of blood chemistry: Secondary | ICD-10-CM

## 2022-01-24 DIAGNOSIS — R296 Repeated falls: Secondary | ICD-10-CM | POA: Diagnosis present

## 2022-01-24 IMAGING — MR MR ABDOMEN WO/W CM MRCP
21 of 22 series · 45 of 48 positions shown · IV contrast (gadavist)
Comparison: CT on [DATE]

CLINICAL DATA: Abdominal pain for 3 months. Elevated liver function
tests. Previous gastric bypass surgery.

EXAM:
MRI ABDOMEN WITHOUT AND WITH CONTRAST (INCLUDING MRCP)
TECHNIQUE: Multiplanar multisequence MR imaging of the abdomen was performed
both before and after the administration of intravenous contrast.
Heavily T2-weighted images of the biliary and pancreatic ducts were
obtained, and three-dimensional MRCP images were rendered by post
processing.
CONTRAST:  7mL GADAVIST GADOBUTROL 1 MMOL/ML IV SOLN

[Series 3: cor haste · coronal · 6.0mm · 1.25mm/px · 1 of 26 slices shown]
[im 1/26]
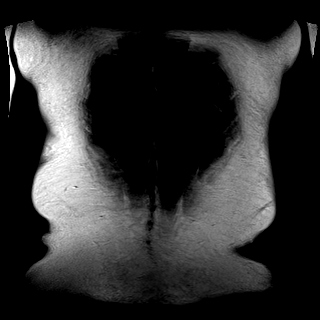

[Series 4: T2 fat-sat · axial · 6.0mm · 1.19mm/px · 1 of 30 slices shown]
[im 1/30]
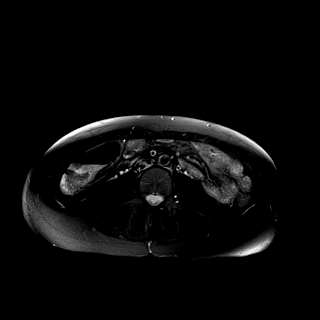

[Series 5: DWI · axial · 6.0mm · 1.42mm/px · 1 of 30 slices shown (1 of 4)]
[im 1/30]
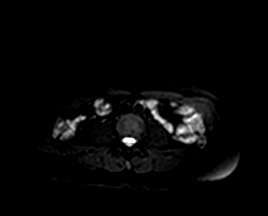

[Series 5: DWI · axial · 6.0mm · 1.42mm/px · 1 of 30 slices shown (2 of 4)]
[im 1/30]
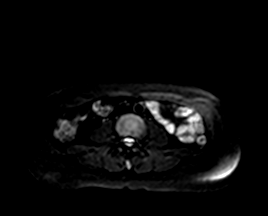

[Series 5: DWI · axial · 6.0mm · 1.42mm/px · 1 of 30 slices shown (3 of 4)]
[im 1/30]
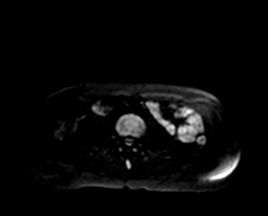

[Series 6: DWI · axial · 6.0mm · 1.42mm/px · 1 of 30 slices shown (4 of 4)]
[im 1/30]
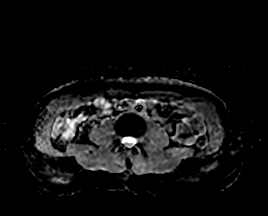

[Series 7: ax in and · axial · 3.0mm · 1.25mm/px · z∈[-113,+100]mm · 3 of 72 slices shown (1 of 2)]
[im 1/72]
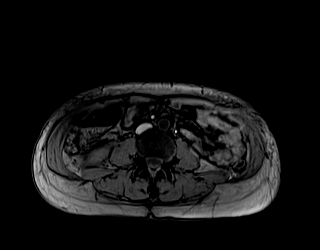
[im 36/72]
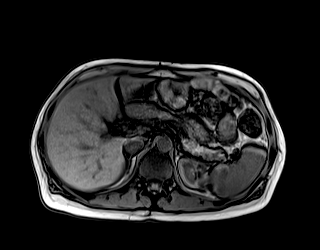
[im 72/72]
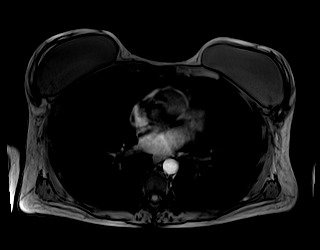

[Series 8: ax in and · axial · 3.0mm · 1.25mm/px · z∈[-113,+100]mm · 3 of 72 slices shown (2 of 2)]
[im 1/72]
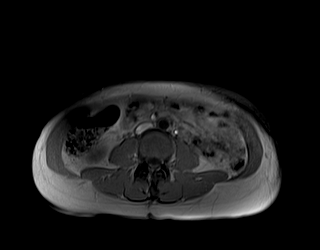
[im 36/72]
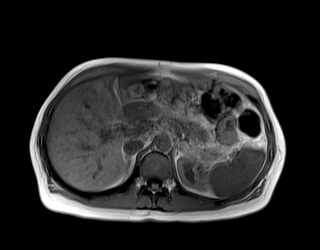
[im 72/72]
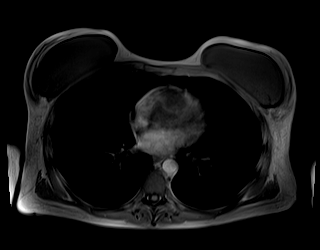

[Series 9: ax haste bh · axial · 6.0mm · 1.19mm/px · 1 of 30 slices shown]
[im 1/30]
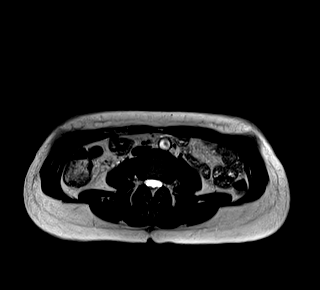

[Series 13: MRCP · coronal · 50.0mm · 0.78mm/px · 1 of 5 slices shown (1 of 2)]
[im 1/5]
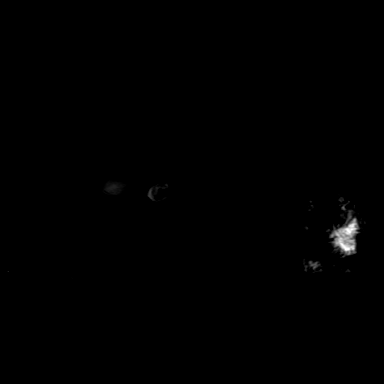

[Series 14: MRCP · coronal · 4.0mm · 1.12mm/px · 1 of 15 slices shown (2 of 2)]
[im 1/15]
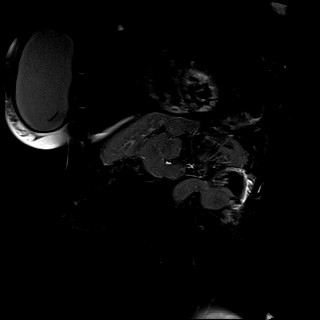

[Series 15: T1 dynamic · axial · 3.0mm · 1.19mm/px · z∈[-118,+95]mm · 3 of 72 slices shown]
[im 1/72]
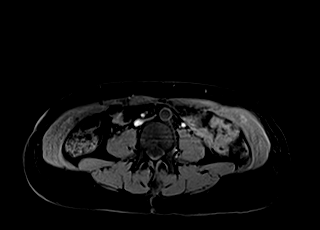
[im 36/72]
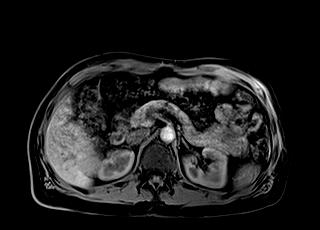
[im 72/72]
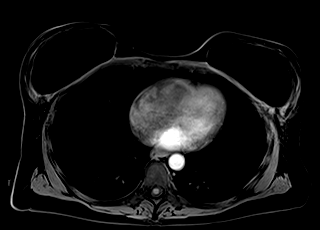

[Series 16: T1 dynamic post-contrast · axial · 3.0mm · 1.19mm/px · z∈[-118,+95]mm · 3 of 72 slices shown (1 of 9)]
[im 1/72]
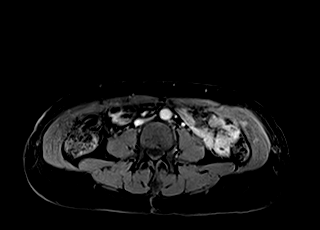
[im 36/72]
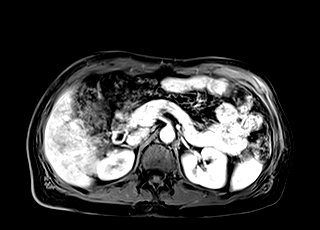
[im 72/72]
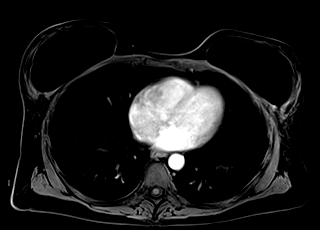

[Series 17: T1 dynamic post-contrast · axial · 3.0mm · 1.19mm/px · z∈[-118,+95]mm · 3 of 72 slices shown (2 of 9)]
[im 1/72]
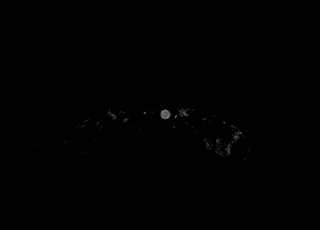
[im 36/72]
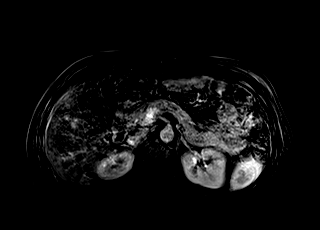
[im 72/72]
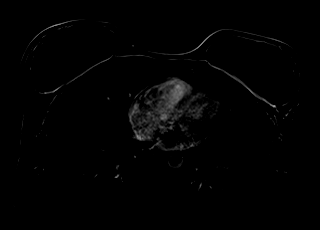

[Series 18: T1 dynamic post-contrast · axial · 3.0mm · 1.19mm/px · z∈[-118,+95]mm · 3 of 72 slices shown (3 of 9)]
[im 1/72]
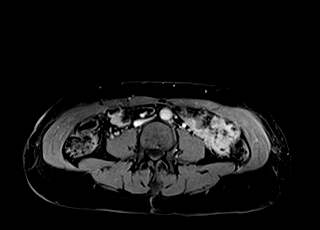
[im 36/72]
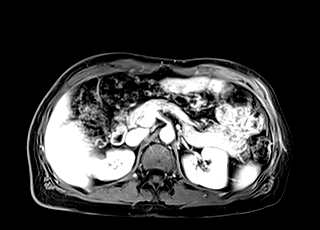
[im 72/72]
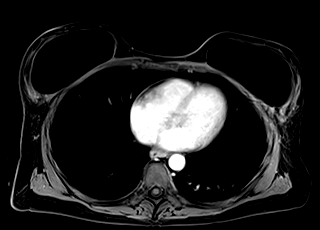

[Series 19: T1 dynamic post-contrast · axial · 3.0mm · 1.19mm/px · z∈[-118,+95]mm · 3 of 72 slices shown (4 of 9)]
[im 1/72]
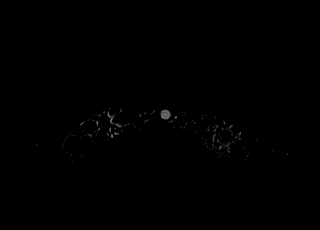
[im 36/72]
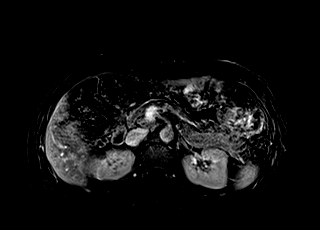
[im 72/72]
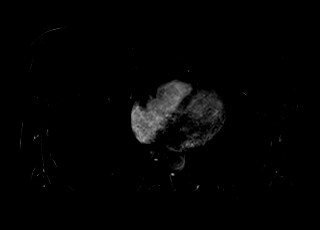

[Series 20: T1 dynamic post-contrast · axial · 3.0mm · 1.19mm/px · z∈[-118,+95]mm · 3 of 72 slices shown (5 of 9)]
[im 1/72]
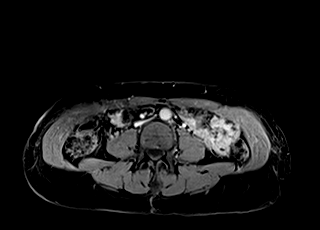
[im 36/72]
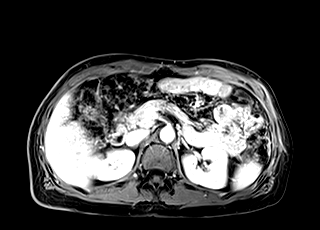
[im 72/72]
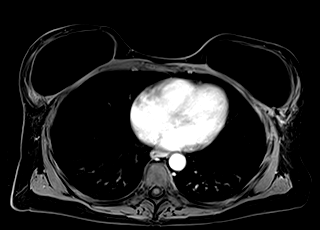

[Series 21: T1 dynamic post-contrast · axial · 3.0mm · 1.19mm/px · z∈[-118,+95]mm · 3 of 72 slices shown (6 of 9)]
[im 1/72]
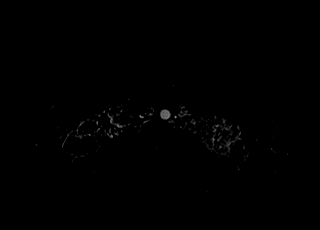
[im 36/72]
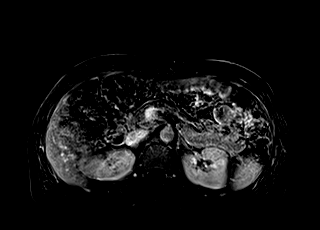
[im 72/72]
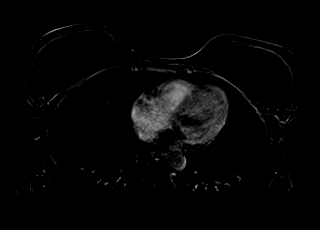

[Series 22: T1 dynamic post-contrast · coronal · 3.0mm · 1.31mm/px · 3 of 72 slices shown (7 of 9)]
[im 1/72]
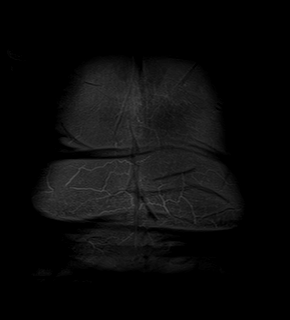
[im 36/72]
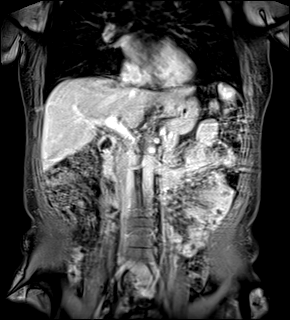
[im 72/72]
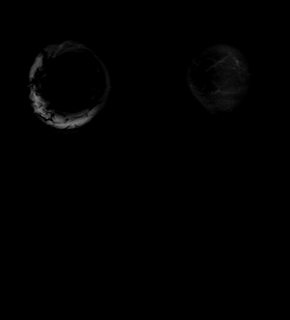

[Series 23: T1 dynamic post-contrast · axial · 3.0mm · 1.19mm/px · z∈[-118,+95]mm · 3 of 72 slices shown (8 of 9)]
[im 1/72]
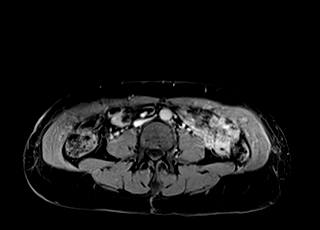
[im 36/72]
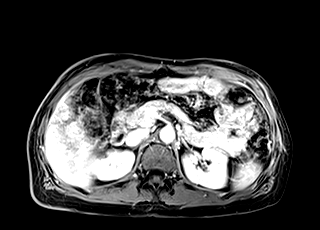
[im 72/72]
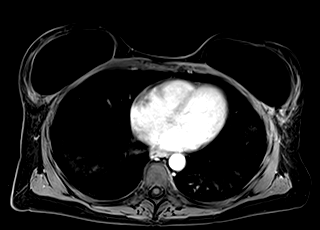

[Series 24: T1 dynamic post-contrast · axial · 3.0mm · 1.19mm/px · z∈[-118,+95]mm · 3 of 72 slices shown (9 of 9)]
[im 1/72]
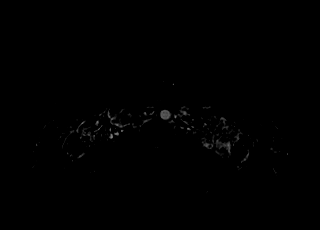
[im 36/72]
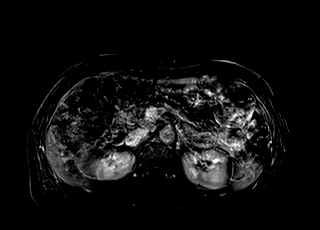
[im 72/72]
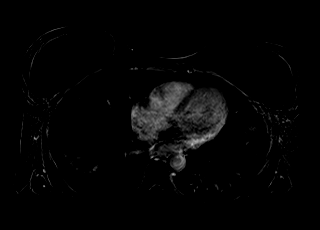

[45 of 48 positions shown; findings below may reference images not displayed]

FINDINGS: Lower chest: No acute findings.

Hepatobiliary: No hepatic masses identified. No evidence of
steatosis on chemical shift imaging. Gallbladder is unremarkable. No
evidence of biliary ductal dilatation.

Pancreas: No mass or inflammatory changes. No evidence of pancreatic
ductal dilatation.

Spleen:  Within normal limits in size and appearance.

Adrenals/Urinary Tract: No masses identified. No evidence of
hydronephrosis.

Stomach/Bowel: Prior gastric bypass surgery noted. Otherwise
unremarkable.

Vascular/Lymphatic: No pathologically enlarged lymph nodes
identified. No acute vascular findings.

Other:  None.

Musculoskeletal:  No suspicious bone lesions identified.
IMPRESSION: No acute findings. No hepatobiliary or other significant
abnormality.

## 2022-01-24 IMAGING — MR MR HEAD W/O CM
14 series · 48 of 48 positions shown · non-contrast
Comparison: No prior MRI, correlation is made with CT head
[DATE]

CLINICAL DATA: Dizziness, migraines, tingling on the right side,
difficulty walking

EXAM:
MRI HEAD WITHOUT CONTRAST
TECHNIQUE: Multiplanar, multiecho pulse sequences of the brain and surrounding
structures were obtained without intravenous contrast.

[Series 5: DWI · axial · 3.0mm · 0.77mm/px · z∈[-72,+73]mm · 4 of 50 slices shown (1 of 6)]
[im 1/50]
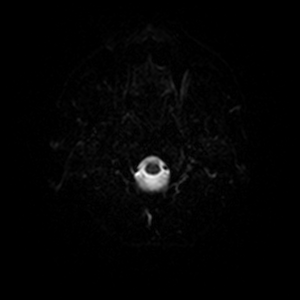
[im 17/50]
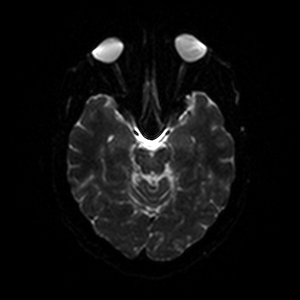
[im 33/50]
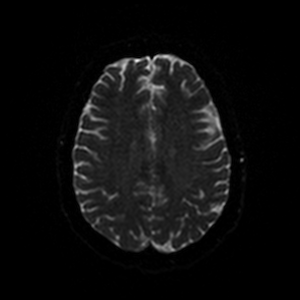
[im 50/50]
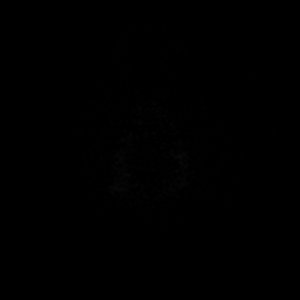

[Series 5: DWI · axial · 3.0mm · 0.77mm/px · z∈[-72,+73]mm · 3 of 50 slices shown (2 of 6)]
[im 1/50]
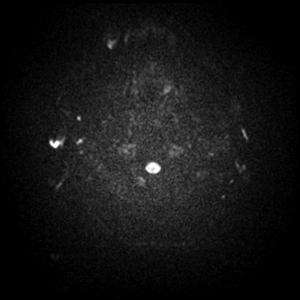
[im 25/50]
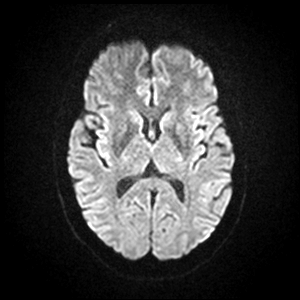
[im 50/50]
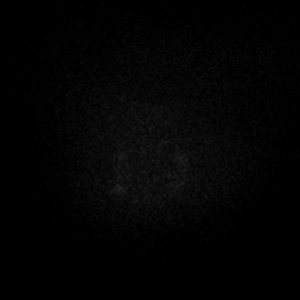

[Series 6: DWI · axial · 3.0mm · 0.77mm/px · z∈[-72,+73]mm · 3 of 49 slices shown (3 of 6)]
[im 1/49]
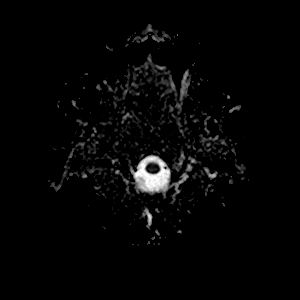
[im 25/49]
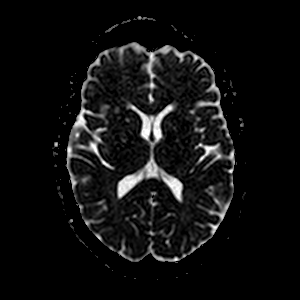
[im 49/49]
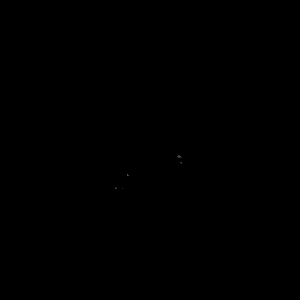

[Series 7: DWI · coronal · 5.0mm · 0.88mm/px · 2 of 28 slices shown (4 of 6)]
[im 1/28]
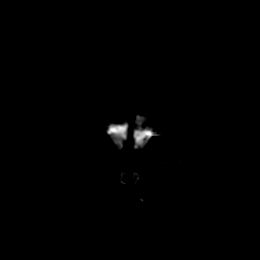
[im 28/28]
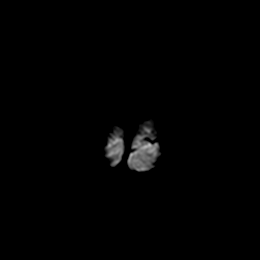

[Series 7: DWI · coronal · 5.0mm · 0.88mm/px · 2 of 28 slices shown (5 of 6)]
[im 1/28]
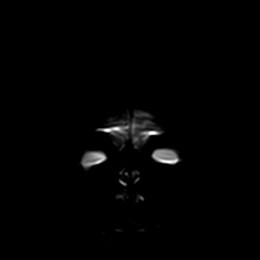
[im 28/28]
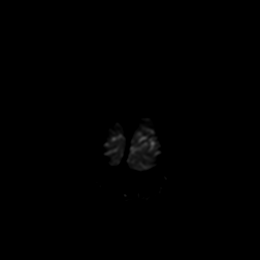

[Series 8: DWI · coronal · 5.0mm · 0.88mm/px · 2 of 28 slices shown (6 of 6)]
[im 1/28]
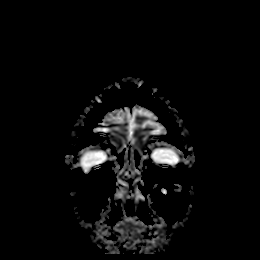
[im 28/28]
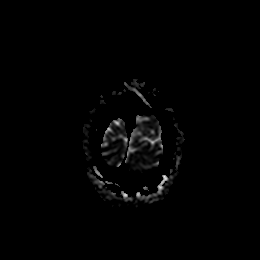

[Series 9: T1 · sagittal · 5.0mm · 0.75mm/px · 1 of 21 slices shown (1 of 2)]
[im 1/21]
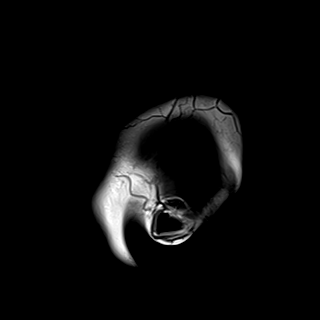

[Series 10: T2 · axial · 5.0mm · 0.72mm/px · z∈[-75,+76]mm · 2 of 23 slices shown (1 of 2)]
[im 1/23]
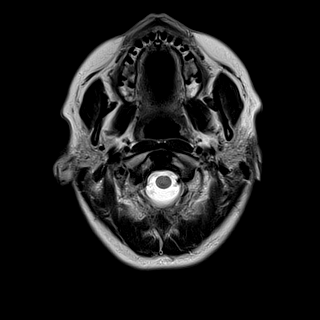
[im 23/23]
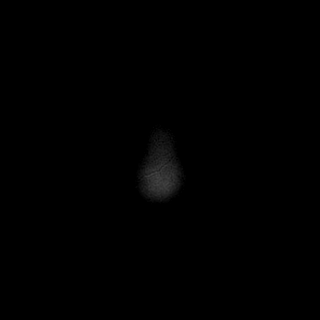

[Series 11: mag_images · axial · 3.0mm · 0.90mm/px · z∈[-85,+89]mm · 4 of 60 slices shown]
[im 1/60]
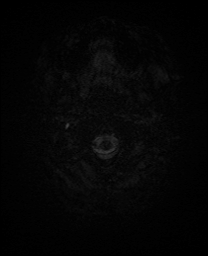
[im 20/60]
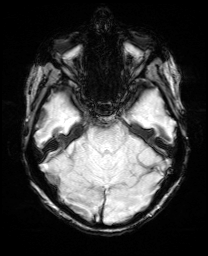
[im 40/60]
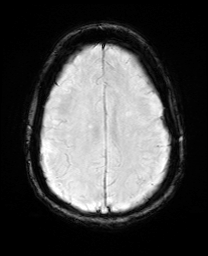
[im 60/60]
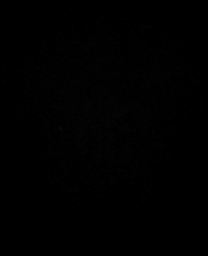

[Series 12: pha_images · axial · 3.0mm · 0.90mm/px · z∈[-85,+77]mm · 4 of 56 slices shown]
[im 1/56]
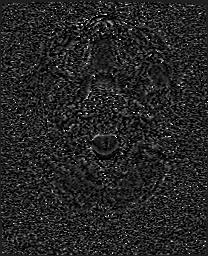
[im 19/56]
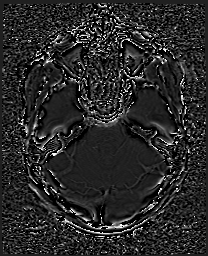
[im 37/56]
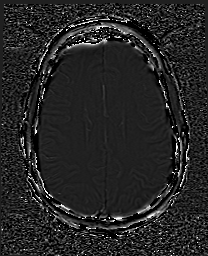
[im 56/56]
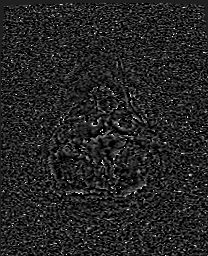

[Series 13: swi_images · axial · 3.0mm · 0.90mm/px · z∈[-85,+89]mm · 4 of 60 slices shown]
[im 1/60]
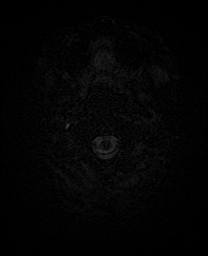
[im 20/60]
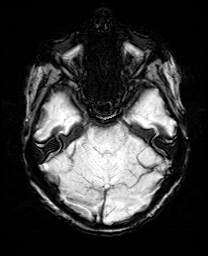
[im 40/60]
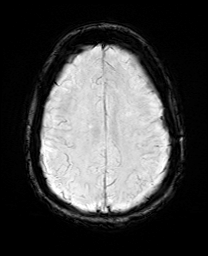
[im 60/60]
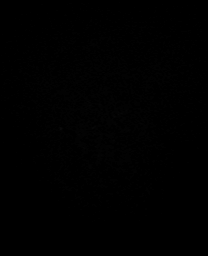

[Series 15: FLAIR · axial · 3.0mm · 0.45mm/px · z∈[-70,+74]mm · 3 of 50 slices shown]
[im 1/50]
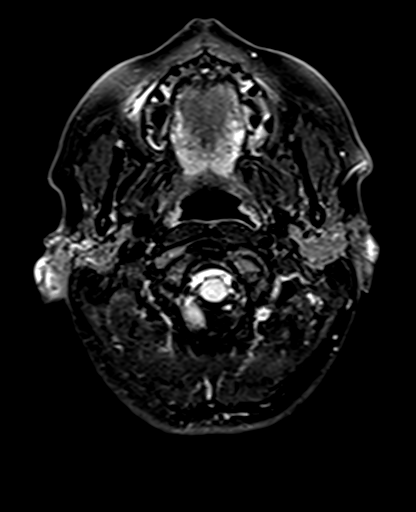
[im 25/50]
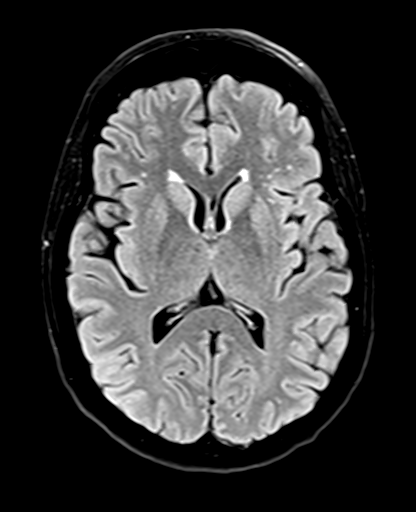
[im 50/50]
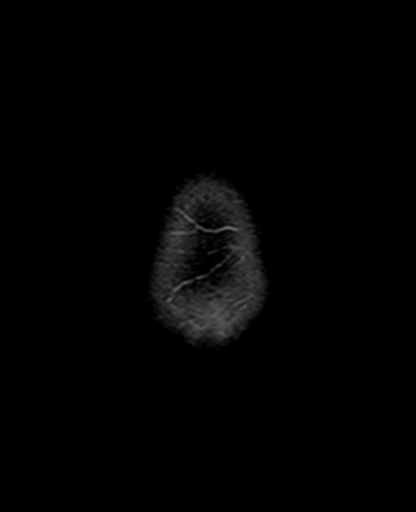

[Series 16: T1 · axial · 1.0mm · 0.98mm/px · z∈[-86,+85]mm · 12 of 173 slices shown (2 of 2)]
[im 1/173]
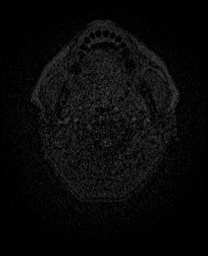
[im 16/173]
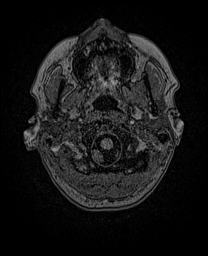
[im 32/173]
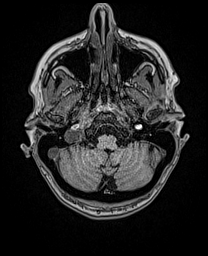
[im 47/173]
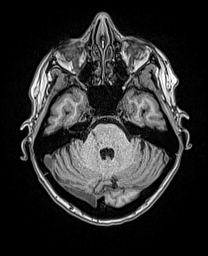
[im 63/173]
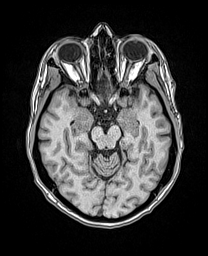
[im 79/173]
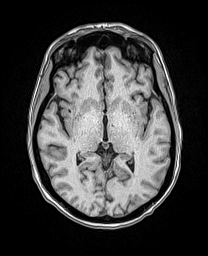
[im 94/173]
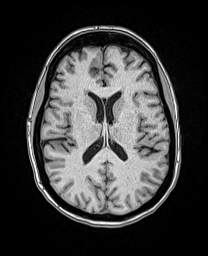
[im 110/173]
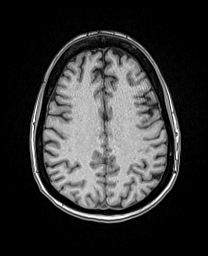
[im 126/173]
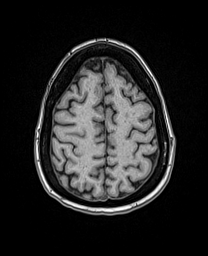
[im 141/173]
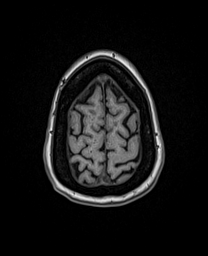
[im 157/173]
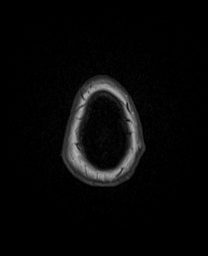
[im 173/173]
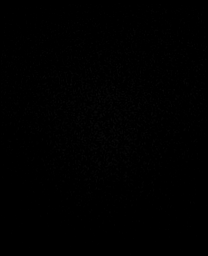

[Series 17: T2 · coronal · 5.0mm · 0.72mm/px · 2 of 28 slices shown (2 of 2)]
[im 1/28]
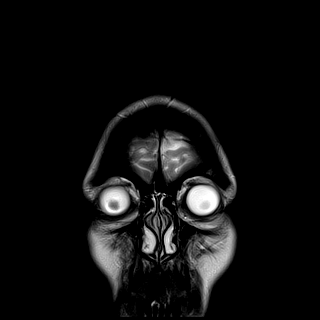
[im 28/28]
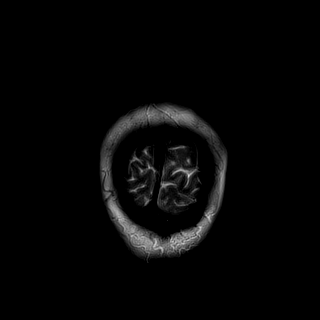

[48 of 48 positions shown; findings below may reference images not displayed]

FINDINGS: Brain: No restricted diffusion to suggest acute or subacute infarct.
No acute hemorrhage, mass, mass effect, or midline shift. No
hydrocephalus or extra-axial collection. No hemosiderin deposition
to suggest remote hemorrhage. Scattered T2 hyperintense signal in
the periventricular white matter, likely the sequela of mild chronic
small vessel ischemic disease.

Vascular: Normal arterial flow voids.

Skull and upper cervical spine: Normal marrow signal.

Sinuses/Orbits: Negative.

Other: The mastoids are well aerated.
IMPRESSION: No acute intracranial process.

## 2022-01-24 MED ORDER — GADOBUTROL 1 MMOL/ML IV SOLN
7.0000 mL | Freq: Once | INTRAVENOUS | Status: AC | PRN
  Administered 2022-01-24: 7 mL via INTRAVENOUS

## 2022-01-29 ENCOUNTER — Other Ambulatory Visit (INDEPENDENT_AMBULATORY_CARE_PROVIDER_SITE_OTHER): Payer: Self-pay

## 2022-01-29 ENCOUNTER — Ambulatory Visit (INDEPENDENT_AMBULATORY_CARE_PROVIDER_SITE_OTHER): Payer: BC Managed Care – PPO | Admitting: Gastroenterology

## 2022-01-29 DIAGNOSIS — R7989 Other specified abnormal findings of blood chemistry: Secondary | ICD-10-CM

## 2022-02-06 ENCOUNTER — Other Ambulatory Visit: Payer: Self-pay | Admitting: Internal Medicine

## 2022-02-06 ENCOUNTER — Other Ambulatory Visit: Payer: Self-pay | Admitting: Radiology

## 2022-02-06 ENCOUNTER — Encounter (INDEPENDENT_AMBULATORY_CARE_PROVIDER_SITE_OTHER): Payer: Self-pay

## 2022-02-06 DIAGNOSIS — R7989 Other specified abnormal findings of blood chemistry: Secondary | ICD-10-CM

## 2022-02-06 DIAGNOSIS — I10 Essential (primary) hypertension: Secondary | ICD-10-CM

## 2022-02-07 ENCOUNTER — Ambulatory Visit (HOSPITAL_COMMUNITY)
Admission: RE | Admit: 2022-02-07 | Discharge: 2022-02-07 | Disposition: A | Discharge status: Home / Self Care | Payer: BC Managed Care – PPO | Source: Ambulatory Visit | Attending: Gastroenterology | Admitting: Gastroenterology

## 2022-02-07 ENCOUNTER — Encounter (HOSPITAL_COMMUNITY): Payer: Self-pay

## 2022-02-07 DIAGNOSIS — B179 Acute viral hepatitis, unspecified: Secondary | ICD-10-CM | POA: Diagnosis not present

## 2022-02-07 DIAGNOSIS — K8309 Other cholangitis: Secondary | ICD-10-CM | POA: Diagnosis not present

## 2022-02-07 DIAGNOSIS — R7989 Other specified abnormal findings of blood chemistry: Secondary | ICD-10-CM | POA: Diagnosis present

## 2022-02-07 LAB — CBC WITH DIFFERENTIAL/PLATELET
Abs Immature Granulocytes: 0 10*3/uL (ref 0.00–0.07)
Basophils Absolute: 0 10*3/uL (ref 0.0–0.1)
Basophils Relative: 0 %
Eosinophils Absolute: 0.1 10*3/uL (ref 0.0–0.5)
Eosinophils Relative: 2 %
HCT: 41 % (ref 36.0–46.0)
Hemoglobin: 13.6 g/dL (ref 12.0–15.0)
Immature Granulocytes: 0 %
Lymphocytes Relative: 34 %
Lymphs Abs: 1.5 10*3/uL (ref 0.7–4.0)
MCH: 30 pg (ref 26.0–34.0)
MCHC: 33.2 g/dL (ref 30.0–36.0)
MCV: 90.3 fL (ref 80.0–100.0)
Monocytes Absolute: 0.4 10*3/uL (ref 0.1–1.0)
Monocytes Relative: 8 %
Neutro Abs: 2.5 10*3/uL (ref 1.7–7.7)
Neutrophils Relative %: 56 %
Platelets: 179 10*3/uL (ref 150–400)
RBC: 4.54 MIL/uL (ref 3.87–5.11)
RDW: 13.3 % (ref 11.5–15.5)
WBC: 4.5 10*3/uL (ref 4.0–10.5)
nRBC: 0 % (ref 0.0–0.2)

## 2022-02-07 LAB — COMPREHENSIVE METABOLIC PANEL
ALT: 67 U/L — ABNORMAL HIGH (ref 0–44)
AST: 54 U/L — ABNORMAL HIGH (ref 15–41)
Albumin: 3.7 g/dL (ref 3.5–5.0)
Alkaline Phosphatase: 91 U/L (ref 38–126)
Anion gap: 9 (ref 5–15)
BUN: 14 mg/dL (ref 6–20)
CO2: 26 mmol/L (ref 22–32)
Calcium: 9.3 mg/dL (ref 8.9–10.3)
Chloride: 105 mmol/L (ref 98–111)
Creatinine, Ser: 0.91 mg/dL (ref 0.44–1.00)
GFR, Estimated: >60 mL/min (ref >60)
Glucose, Bld: 86 mg/dL (ref 70–99)
Potassium: 4.6 mmol/L (ref 3.5–5.1)
Sodium: 140 mmol/L (ref 135–145)
Total Bilirubin: 1.7 mg/dL — ABNORMAL HIGH (ref 0.3–1.2)
Total Protein: 7 g/dL (ref 6.5–8.1)

## 2022-02-07 LAB — PROTIME-INR
INR: 1.1 (ref 0.8–1.2)
Prothrombin Time: 13.6 seconds (ref 11.4–15.2)

## 2022-02-07 IMAGING — US US BIOPSY CORE LIVER
1 series · 13 of 20 positions shown · non-contrast
Comparison: Abdominal MRI-[DATE]

INDICATION: Elevated LFTs of uncertain etiology. Please perform
ultrasound-guided liver biopsy for tissue diagnostic purposes.

EXAM:
ULTRASOUND GUIDED LIVER BIOPSY

[Series 1: us core biopsy (liver) · 13 of 20 slices shown]
[im 1/20]
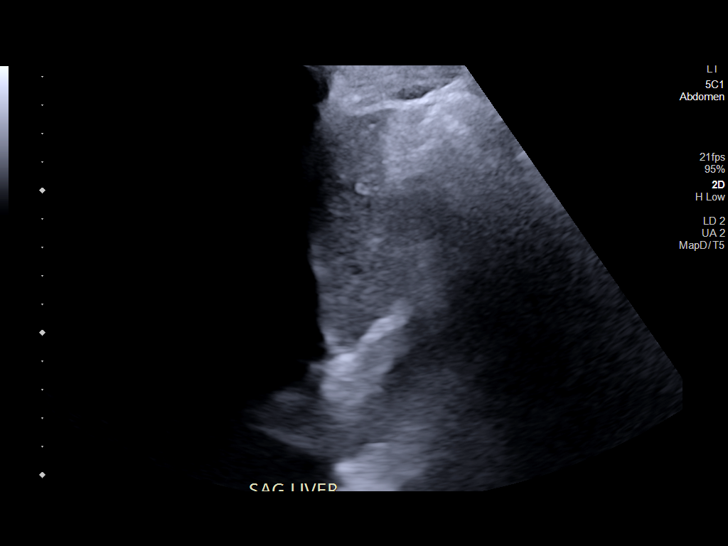
[im 3/20]
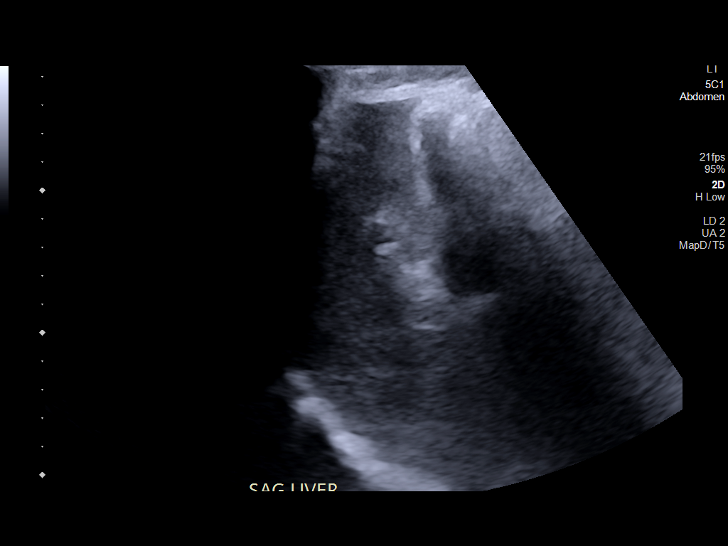
[im 4/20]
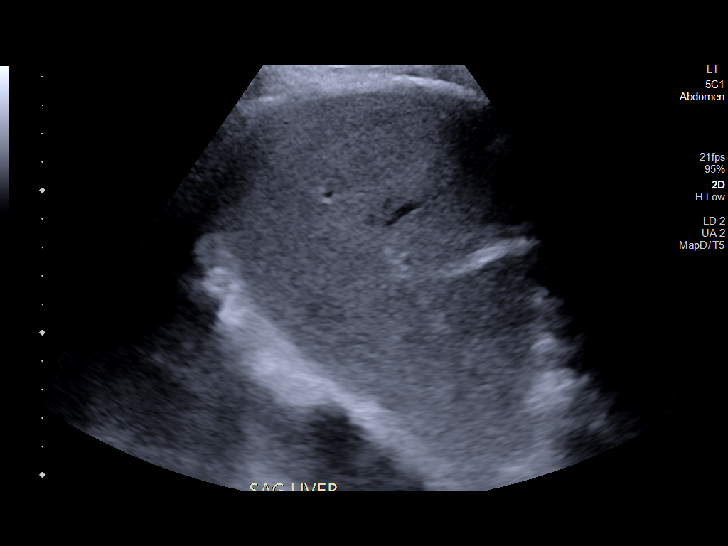
[im 6/20]
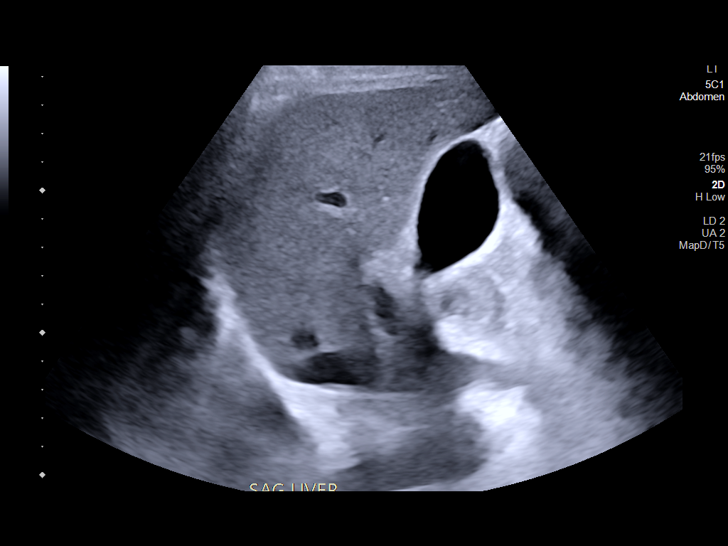
[im 7/20]
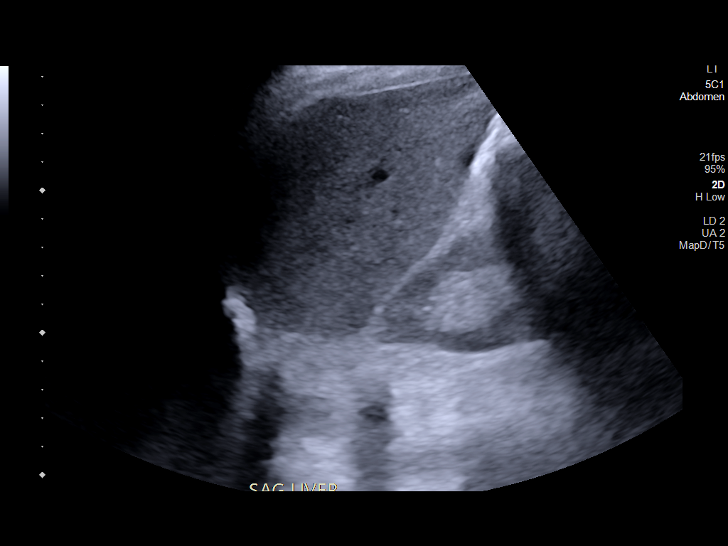
[im 9/20]
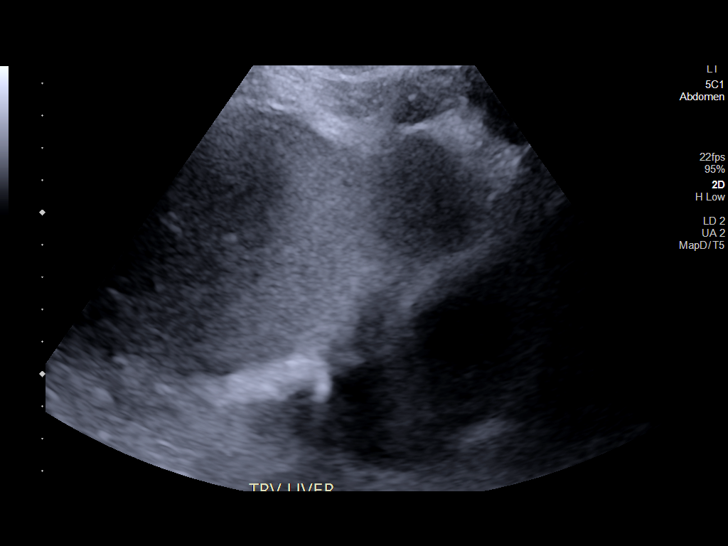
[im 11/20]
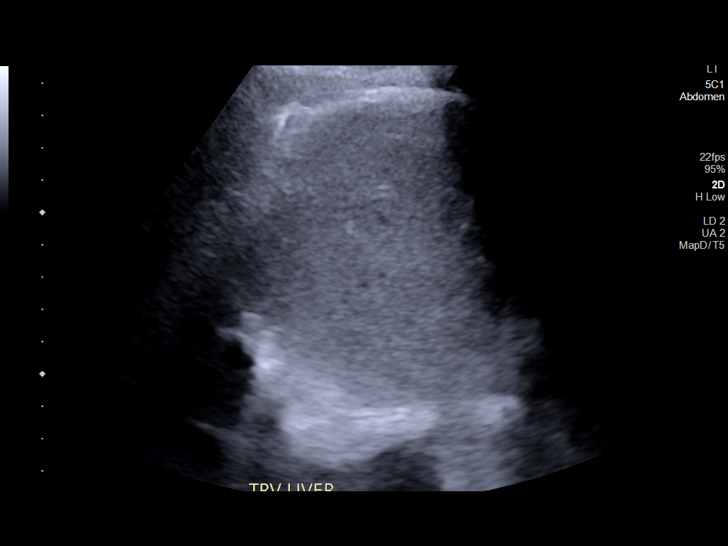
[im 12/20]
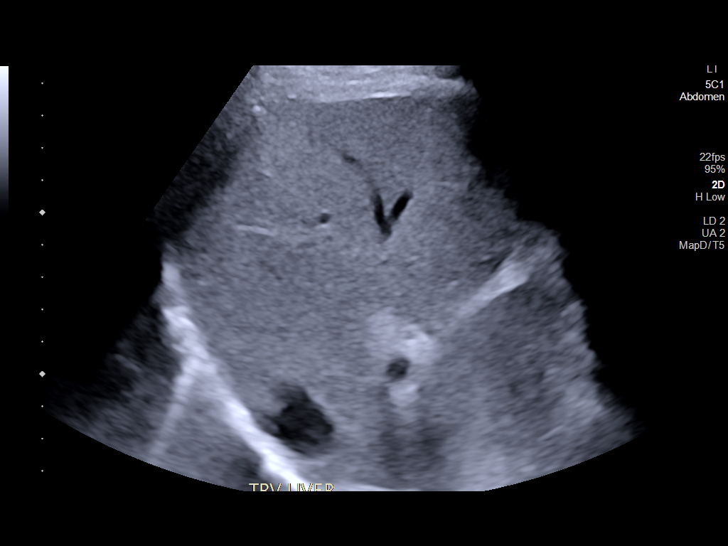
[im 14/20]
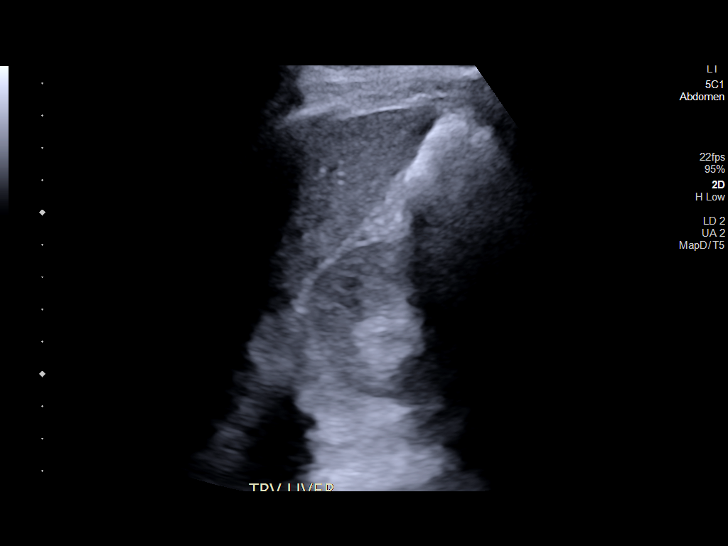
[im 15/20]
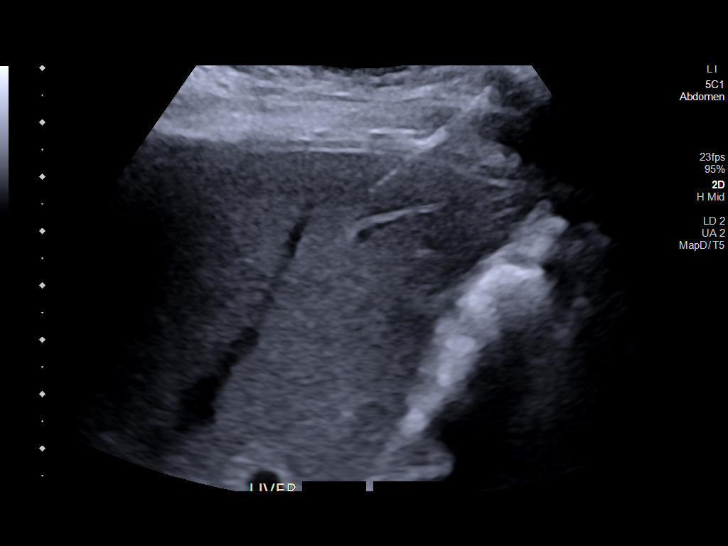
[im 17/20]
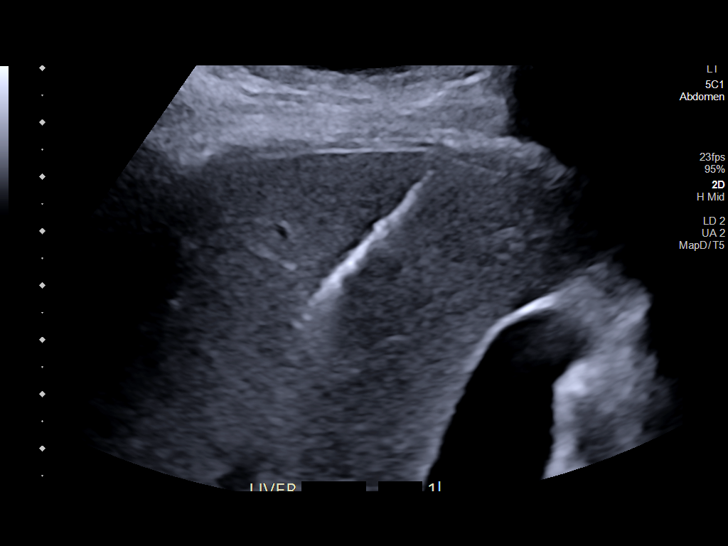
[im 18/20]
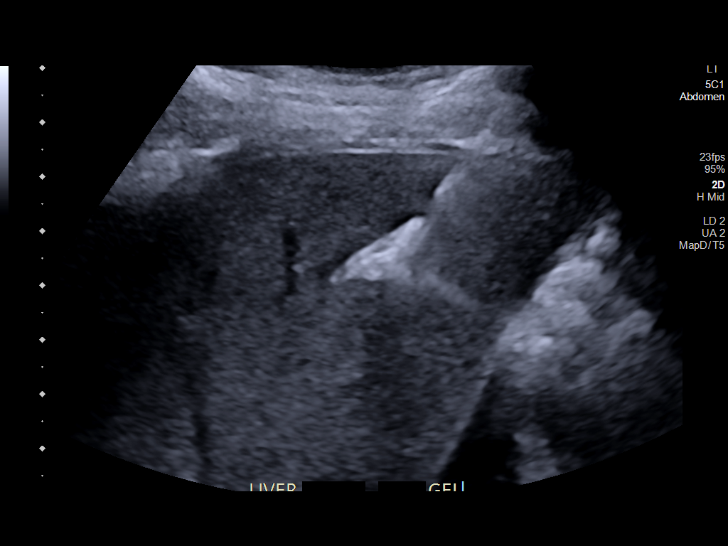
[im 20/20]
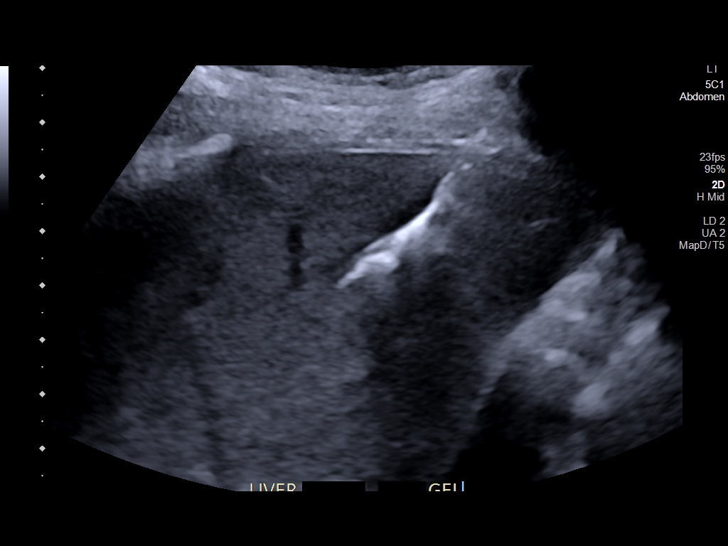

[13 of 20 positions shown; findings below may reference images not displayed]

MEDICATIONS:
Benadryl 50 mg IV (administered per patient request secondary to her
report of previously experiencing itching following administration
of narcotics.

ANESTHESIA/SEDATION:
Moderate (conscious) sedation was employed during this procedure as
administered by the [REDACTED].

A total of Versed 2 mg and Fentanyl 50 mcg was administered
intravenously.

Moderate Sedation Time: 10 minutes. The patient's level of
consciousness and vital signs were monitored continuously by
radiology nursing throughout the procedure under my direct
supervision.

COMPLICATIONS:
None immediate.

PROCEDURE:
Informed written consent was obtained from the patient after a
discussion of the risks, benefits and alternatives to treatment. The
patient understands and consents the procedure. A timeout was
performed prior to the initiation of the procedure.

Ultrasound scanning was performed of the right upper abdominal
quadrant and the procedure was planned. The right upper abdomen was
prepped and draped in the usual sterile fashion. The overlying soft
tissues were anesthetized with 1% lidocaine with epinephrine. A 17
gauge, 6.8 cm co-axial needle was advanced into a peripheral aspect
of the right lobe of the liver and 3 core biopsies were obtained
with an 18 gauge core device under direct ultrasound guidance.

The co-axial needle track was embolized with the administration of a
Gel-Foam slurry. Superficial hemostasis was obtained with manual
compression. Post procedural scanning was negative for definitive
area of hemorrhage. A dressing was applied. The patient tolerated
the procedure well without immediate post procedural complication.
IMPRESSION: Technically successful ultrasound guided liver biopsy.

## 2022-02-07 MED ORDER — SODIUM CHLORIDE 0.9 % IV SOLN: INTRAVENOUS | Status: DC

## 2022-02-07 MED ORDER — DIPHENHYDRAMINE HCL 50 MG/ML IJ SOLN
25.0000 mg | Freq: Once | INTRAMUSCULAR | Status: AC
  Administered 2022-02-07: 25 mg via INTRAVENOUS
  Filled 2022-02-07: qty 1

## 2022-02-07 MED ORDER — LIDOCAINE HCL (PF) 1 % IJ SOLN
INTRAMUSCULAR | Status: AC | PRN
  Administered 2022-02-07: 15 mL

## 2022-02-07 MED ORDER — DIPHENHYDRAMINE HCL 50 MG/ML IJ SOLN
INTRAMUSCULAR | Status: AC | PRN
  Administered 2022-02-07: 25 mg via INTRAVENOUS

## 2022-02-07 MED ORDER — LIDOCAINE HCL 1 % IJ SOLN
INTRAMUSCULAR | Status: AC
  Filled 2022-02-07: qty 20

## 2022-02-07 MED ORDER — MIDAZOLAM HCL 2 MG/2ML IJ SOLN
INTRAMUSCULAR | Status: AC
  Filled 2022-02-07: qty 2

## 2022-02-07 MED ORDER — FENTANYL CITRATE (PF) 100 MCG/2ML IJ SOLN
INTRAMUSCULAR | Status: AC
  Filled 2022-02-07: qty 2

## 2022-02-07 MED ORDER — FENTANYL CITRATE (PF) 100 MCG/2ML IJ SOLN
INTRAMUSCULAR | Status: AC | PRN
  Administered 2022-02-07 (×2): 50 ug via INTRAVENOUS

## 2022-02-07 MED ORDER — DIPHENHYDRAMINE HCL 50 MG/ML IJ SOLN
INTRAMUSCULAR | Status: AC
  Filled 2022-02-07: qty 1

## 2022-02-07 MED ORDER — MIDAZOLAM HCL 2 MG/2ML IJ SOLN
INTRAMUSCULAR | Status: AC | PRN
  Administered 2022-02-07 (×2): 1 mg via INTRAVENOUS

## 2022-02-07 MED ORDER — GELATIN ABSORBABLE 12-7 MM EX MISC
CUTANEOUS | Status: AC
  Filled 2022-02-07: qty 1

## 2022-02-07 NOTE — Discharge Instructions (Addendum)
Please call Interventional Radiology clinic [phone] with any questions or concerns.   You may remove your dressing and shower tomorrow.   Liver Biopsy, Care After After a liver biopsy, it is common to have these things in the area where the biopsy was done. You may: Have pain. Feel sore. Have bruising. You may also feel tired for a few days. Follow these instructions at home: Medicines Take over-the-counter and prescription medicines only as told by your doctor. If you were prescribed an antibiotic medicine, take it as told by your doctor. Do not stop taking the antibiotic, even if you start to feel better. Do not take medicines that may thin your blood. These medicines include aspirin and ibuprofen. Take them only if your doctor tells you to. If told, take steps to prevent problems with pooping (constipation). You may need to: Drink enough fluid to keep your pee (urine) pale yellow. Take medicines. You will be told what medicines to take. Eat foods that are high in fiber. These include beans, whole grains, and fresh fruits and vegetables. Limit foods that are high in fat and sugar. These include fried or sweet foods. Ask your doctor if you should avoid driving or using machines while you are taking your medicine. Caring for your incision Follow instructions from your doctor about how to take care of your cut from surgery (incisions). Make sure you: Wash your hands with soap and water for at least 20 seconds before and after you change your bandage. If you cannot use soap and water, use hand sanitizer. Change your bandage. Leavestitches or skin glue in place for at least two weeks. Leave tape strips alone unless you are told to take them off. You may trim the edges of the tape strips if they curl up. Check your incision every day for signs of infection. Check for: Redness, swelling, or more pain. Fluid or blood. Warmth. Pus or a bad smell. Do not take baths, swim, or use a  hot tub. Ask your doctor about taking showers or sponge baths. Activity Rest at home for 1-2 days, or as told by your doctor. Get up to take short walks every 1 to 2 hours. Ask for help if you feel weak or unsteady. Do not lift anything that is heavier than 10 lb (4.5 kg), or the limit that you are told. Do not play contact sports for 2 weeks after the procedure. Return to your normal activities as told by your doctor. Ask what activities are safe for you. General instructions  Do not drink alcohol in the first week after the procedure. Plan to have a responsible adult care for you for the time you are told after you leave the hospital or clinic. This is important. It is up to you to get the results of your procedure. Ask how to get your results when they are ready. Keep all follow-up visits.   Contact a doctor if: You have more bleeding in your incision. Your incision swells, or is red and more painful. You have fluid that comes from your incision. You develop a rash. You have fever or chills. Get help right away if: You have swelling, bloating, or pain in your belly (abdomen). You get dizzy or faint. You vomit or you feel like vomiting. You have trouble breathing or feel short of breath. You have chest pain. You have problems talking or seeing. You have trouble with your balance or moving your arms or legs. These symptoms may be an emergency. Get help  right away. Call your local emergency services (911 in the [place] Do not wait to see if the symptoms will go away. Do not drive yourself to the hospital. Summary After the procedure, it is common to have pain, soreness, bruising, and tiredness. Your doctor will tell you how to take care of yourself at home. Change your bandage, take your medicines, and limit your activities as told by your doctor. Call your doctor if you have symptoms of infection. Get help right away if your belly swells, your cut bleeds a lot, or you have trouble  talking or breathing. This information is not intended to replace advice given to you by your health care provider. Make sure you discuss any questions you have with your healthcare provider. Document Revised: 07/10/2020 Document Reviewed: 07/10/2020 Elsevier Patient Education  2022 [employer].     Moderate Conscious Sedation, Adult, Care After This sheet gives you information about how to care for yourself after your procedure. Your health care provider may also give you more specific instructions. If you have problems or questions, contact your health care provider. What can I expect after the procedure? After the procedure, it is common to have: Sleepiness for several hours. Impaired judgment for several hours. Difficulty with balance. Vomiting if you eat too soon. Follow these instructions at home: For the time period you were told by your health care provider: Rest. Do not participate in activities where you could fall or become injured. Do not drive or use machinery. Do not drink alcohol. Do not take sleeping pills or medicines that cause drowsiness. Do not make important decisions or sign legal documents. Do not take care of children on your own.        Eating and drinking Follow the diet recommended by your health care provider. Drink enough fluid to keep your urine pale yellow. If you vomit: Drink water, juice, or soup when you can drink without vomiting. Make sure you have little or no nausea before eating solid foods.    General instructions Take over-the-counter and prescription medicines only as told by your health care provider. Have a responsible adult stay with you for the time you are told. It is important to have someone help care for you until you are awake and alert. Do not smoke. Keep all follow-up visits as told by your health care provider. This is important. Contact a health care provider if: You are still sleepy or having trouble with balance after  24 hours. You feel light-headed. You keep feeling nauseous or you keep vomiting. You develop a rash. You have a fever. You have redness or swelling around the IV site. Get help right away if: You have trouble breathing. You have new-onset confusion at home. Summary After the procedure, it is common to feel sleepy, have impaired judgment, or feel nauseous if you eat too soon. Rest after you get home. Know the things you should not do after the procedure. Follow the diet recommended by your health care provider and drink enough fluid to keep your urine pale yellow. Get help right away if you have trouble breathing or new-onset confusion at home. This information is not intended to replace advice given to you by your health care provider. Make sure you discuss any questions you have with your health care provider. Document Revised: 12/24/2019 Document Reviewed: 07/22/2019 Elsevier Patient Education  2021 [employer].

## 2022-02-07 NOTE — H&P (Signed)
Chief Complaint: Patient was seen in consultation today for image guided random renal biopsy at the request of Harvel Quale  Referring Physician(s): Harvel Quale  Supervising Physician: Sandi Mariscal  Patient Status: [facility] - Out-pt  History of Present Illness: Theresa [name] is a 51 y.o. female with PMHs of HTN, HLD, migraine, bipolar disorder, anxiety, ADD, s/p gastric bypass in 2015, and elevated LFTs and persistent nausea and vomiting.   Patient referred to GI in April 2023 due to persistent nausea and vomiting as well as elevated LFTs.  Per chart, LFTs was found to be elevated in March 2023, work-up so far has not revealed clear etiology.  A image guided random liver biopsy was recommended to the patient for further evaluation and management by the GI team.  After thorough discussion and shared decision making, patient decided to proceed with the biopsy.  IR was requested for image guided random liver biopsy. Presents to [facility] IR today for the procedure.    Patient laying in bed, not in acute distress. Reports mild RUQ discomfort and SOB on exertion.   Denise headache, fever, chills, shortness of breath, cough, chest pain, nausea ,vomiting, and bleeding.   Past Medical History:  Diagnosis Date   Anemia    Anxiety    Attention deficit disorder    Bipolar disorder ([place])    currently no meds- agitation noted during PAT assessment   Depression    GERD (gastroesophageal reflux disease)    resolved   H/O transfusion    17 yrs ago postpartum   Hyperlipidemia    resolved   Hypertension    history of HTN prior to gastric bypass   Hypoglycemia    Left breast abscess 01/22/2017   Sleep apnea, obstructive    cpap used auto settings, resolved gastric bypass    Past Surgical History:  Procedure Laterality Date   ABDOMINAL HYSTERECTOMY     ABDOMINOPLASTY     AUGMENTATION MAMMAPLASTY Bilateral 2016   BREAST BIOPSY Left 01/2017   abscess removed    CESAREAN SECTION     COLON SURGERY     COLONOSCOPY WITH PROPOFOL N/A 07/04/2021   Procedure: COLONOSCOPY WITH PROPOFOL;  Surgeon: Harvel Quale, MD;  Location: [facility];  Service: Gastroenterology;  Laterality: N/A;  2:00 pm   GASTRIC ROUX-EN-Y N/A 12/06/2013   Procedure: LAPAROSCOPIC ROUX-EN-Y GASTRIC BYPASS WITH UPPER ENDOSCOPY;  Surgeon: Gayland Curry, MD;  Location: [facility];  Service: General;  Laterality: N/A;   INCISION AND DRAINAGE ABSCESS Left 01/22/2017   Procedure: INCISION AND DRAINAGE LEFT  BREAST ABSCESS;  Surgeon: Fanny Skates, MD;  Location: [place];  Service: General;  Laterality: Left;   LAPAROTOMY N/A 02/18/2017   Procedure: LAPAROTOMY;  Surgeon: Molli Posey, MD;  Location: [facility];  Service: Gynecology;  Laterality: N/A;   POLYPECTOMY  07/04/2021   Procedure: POLYPECTOMY;  Surgeon: Harvel Quale, MD;  Location: [facility];  Service: Gastroenterology;;   SALPINGOOPHORECTOMY Bilateral 02/18/2017   Procedure: BILATERAL SALPINGO OOPHORECTOMY;  Surgeon: Molli Posey, MD;  Location: [place] [facility];  Service: Gynecology;  Laterality: Bilateral;   TUBAL LIGATION     UNILATERAL SALPINGECTOMY Right 09/05/2015   Procedure: UNILATERAL SALPINGECTOMY;  Surgeon: Molli Posey, MD;  Location: [facility];  Service: Gynecology;  Laterality: Right;   VAGINAL HYSTERECTOMY N/A 09/05/2015   Procedure: HYSTERECTOMY VAGINAL;  Surgeon: Molli Posey, MD;  Location: [facility];  Service: Gynecology;  Laterality: N/A;   WISDOM TOOTH EXTRACTION  Allergies: Other  Medications: Prior to Admission medications   Medication Sig Start Date End Date Taking? Authorizing Provider  ALPRAZolam Duanne Moron) 0.5 MG tablet Take 0.5 mg by mouth daily as needed for anxiety. 06/09/19   [provider]  buPROPion (WELLBUTRIN SR) 150 MG 12 hr tablet TAKE 1 TABLET BY MOUTH TWICE A DAY 01/02/20   Malfi, Lupita Raider, FNP  estradiol (ESTRACE) 1 MG tablet Take 1 tablet  (1 mg total) by mouth 2 (two) times daily. 05/11/20   Malfi, Lupita Raider, FNP  ferrous sulfate 325 (65 FE) MG EC tablet Take 325 mg by mouth daily with breakfast.    [provider]  gabapentin (NEURONTIN) 600 MG tablet TAKE 1 TABLET BY MOUTH IN THE MORNING TAKE 1 TABLET IN THE EVENING AND TAKE 2 TABLETS AT BEDTIME Patient taking differently: Take 600-1,200 mg by mouth See admin instructions. TAKE  600 MG BY MOUTH IN THE MORNING AND AT BEDTIME, 1200 MG AT LUNCHTIME 01/17/20   Malfi, Lupita Raider, FNP  ibuprofen (ADVIL) 200 MG tablet Take 400 mg by mouth every 6 (six) hours as needed for headache.    [provider]  lisinopril (ZESTRIL) 20 MG tablet Take 1 tablet (20 mg total) by mouth daily. 03/02/21   Jearld Fenton, NP  lubiprostone (AMITIZA) 8 MCG capsule Take 1 capsule (8 mcg total) by mouth 2 (two) times daily with a meal. 12/24/21   Montez Morita, Quillian Quince, MD  methylphenidate (RITALIN) 20 MG tablet Take 20 mg by mouth daily as needed (Focus). 06/23/17   [provider]  metoprolol succinate (TOPROL-XL) 25 MG 24 hr tablet TAKE 1 TABLET BY MOUTH EVERY DAY 04/01/20   Malfi, Lupita Raider, FNP  pantoprazole (PROTONIX) 40 MG tablet Take 1 tablet (40 mg total) by mouth daily. 12/24/21   Harvel Quale, MD  sodium chloride (MURO 128) 2 % ophthalmic solution Place 1 drop into both eyes in the morning, at noon, and at bedtime.    [provider]  traZODone (DESYREL) 100 MG tablet Take 50 mg by mouth at bedtime.    [provider]     Family History  Problem Relation Age of Onset   Hypertension Mother    Cancer Mother 4       breast   Diabetes Mother    Hyperlipidemia Mother    Thyroid disease Mother    Breast cancer Mother 84   Depression Mother    Hypertension Father    Diabetes Father    Heart disease Father    Stroke Father    Depression Father    Bipolar disorder Father    Cancer Maternal Grandmother        Breast   Hyperlipidemia Maternal  Grandmother    Heart disease Maternal Grandmother    Stroke Maternal Grandmother    Hypertension Maternal Grandmother    Breast cancer Maternal Grandmother 44   Hyperlipidemia Maternal Grandfather    Hypertension Maternal Grandfather    Cancer Paternal Grandmother        Colon   Hyperlipidemia Paternal Grandmother    Heart disease Paternal Grandmother    Hyperlipidemia Paternal Grandfather    Hypertension Paternal Grandfather    Breast cancer Paternal Aunt     Social History   Socioeconomic History   Marital status: Married    Spouse name: Not on file   Number of children: 4   Years of education: Not on file   Highest education level: Not on file  Occupational  History   Occupation: Customer service manager: [employer]  Tobacco Use   Smoking status: Never   Smokeless tobacco: Never  Vaping Use   Vaping Use: Never used  Substance and Sexual Activity   Alcohol use: Yes    Comment: rare-occ. monthly   Drug use: No   Sexual activity: Yes    Birth control/protection: Surgical  Other Topics Concern   Not on file  Social History Narrative   [name] lives with her husband some of her children & 1 grand-son. She works FT as Audiological scientist, also teaches.   Social Determinants of Health   Financial Resource Strain: Not on file  Food Insecurity: Not on file  Transportation Needs: Not on file  Physical Activity: Not on file  Stress: Not on file  Social Connections: Not on file     Review of Systems: A 12 point ROS discussed and pertinent positives are indicated in the HPI above.  All other systems are negative.  Vital Signs: BP 135/87   Pulse 86   Temp 97.7 F (36.5 C) (Oral)   Resp 16   Ht 5' 4"  (1.626 m)   Wt 147 lb (66.7 kg)   LMP 10/24/2014 (Approximate)   SpO2 99%   BMI 25.23 kg/m   Physical Exam Vitals and nursing note reviewed.  Constitutional:      General: Patient is not in acute distress.    Appearance: Normal appearance. Patient is not  ill-appearing.  HENT:     Head: Normocephalic and atraumatic.     Mouth/Throat:     Mouth: Mucous membranes are moist.     Pharynx: Oropharynx is clear.  Cardiovascular:     Rate and Rhythm: Normal rate and regular rhythm.     Pulses: Normal pulses.     Heart sounds: Normal heart sounds.  Pulmonary:     Effort: Pulmonary effort is normal.     Breath sounds: Normal breath sounds.  Abdominal:     General: Abdomen is flat. Bowel sounds are normal.     Palpations: Abdomen is soft.  Musculoskeletal:     Cervical back: Neck supple.  Skin:    General: Skin is warm and dry.     Coloration: Skin is not jaundiced or pale.  Neurological:     Mental Status: Patient is alert and oriented to person, place, and time.  Psychiatric:        Mood and Affect: Mood normal.        Behavior: Behavior normal.        Judgment: Judgment normal.    MD Evaluation Airway: WNL Heart: WNL Abdomen: WNL Chest/ Lungs: WNL ASA  Classification: 2 Mallampati/Airway Score: Two  Imaging: MR BRAIN WO CONTRAST  Result Date: 01/24/2022 CLINICAL DATA:  Dizziness, migraines, tingling on the right side, difficulty walking EXAM: MRI HEAD WITHOUT CONTRAST TECHNIQUE: Multiplanar, multiecho pulse sequences of the brain and surrounding structures were obtained without intravenous contrast. COMPARISON:  No prior MRI, correlation is made with CT head 11/28/2021 FINDINGS: Brain: No restricted diffusion to suggest acute or subacute infarct. No acute hemorrhage, mass, mass effect, or midline shift. No hydrocephalus or extra-axial collection. No hemosiderin deposition to suggest remote hemorrhage. Scattered T2 hyperintense signal in the periventricular white matter, likely the sequela of mild chronic small vessel ischemic disease. Vascular: Normal arterial flow voids. Skull and upper cervical spine: Normal marrow signal. Sinuses/Orbits: Negative. Other: The mastoids are well aerated. IMPRESSION: No acute intracranial process.  Electronically Signed  By: Merilyn Baba M.D.   On: 01/24/2022 11:59   MR 3D Recon At Scanner  Result Date: 01/24/2022 CLINICAL DATA:  Abdominal pain for 3 months. Elevated liver function tests. Previous gastric bypass surgery. EXAM: MRI ABDOMEN WITHOUT AND WITH CONTRAST (INCLUDING MRCP) TECHNIQUE: Multiplanar multisequence MR imaging of the abdomen was performed both before and after the administration of intravenous contrast. Heavily T2-weighted images of the biliary and pancreatic ducts were obtained, and three-dimensional MRCP images were rendered by post processing. CONTRAST:  4m GADAVIST GADOBUTROL 1 MMOL/ML IV SOLN COMPARISON:  CT on 11/28/2021 FINDINGS: Lower chest: No acute findings. Hepatobiliary: No hepatic masses identified. No evidence of steatosis on chemical shift imaging. Gallbladder is unremarkable. No evidence of biliary ductal dilatation. Pancreas: No mass or inflammatory changes. No evidence of pancreatic ductal dilatation. Spleen:  Within normal limits in size and appearance. Adrenals/Urinary Tract: No masses identified. No evidence of hydronephrosis. Stomach/Bowel: Prior gastric bypass surgery noted. Otherwise unremarkable. Vascular/Lymphatic: No pathologically enlarged lymph nodes identified. No acute vascular findings. Other:  None. Musculoskeletal:  No suspicious bone lesions identified. IMPRESSION: No acute findings. No hepatobiliary or other significant abnormality. Electronically Signed   By: JMarlaine HindM.D.   On: 01/24/2022 15:04   MR ABDOMEN MRCP W WO CONTAST  Result Date: 01/24/2022 CLINICAL DATA:  Abdominal pain for 3 months. Elevated liver function tests. Previous gastric bypass surgery. EXAM: MRI ABDOMEN WITHOUT AND WITH CONTRAST (INCLUDING MRCP) TECHNIQUE: Multiplanar multisequence MR imaging of the abdomen was performed both before and after the administration of intravenous contrast. Heavily T2-weighted images of the biliary and pancreatic ducts were obtained, and  three-dimensional MRCP images were rendered by post processing. CONTRAST:  720mGADAVIST GADOBUTROL 1 MMOL/ML IV SOLN COMPARISON:  CT on 11/28/2021 FINDINGS: Lower chest: No acute findings. Hepatobiliary: No hepatic masses identified. No evidence of steatosis on chemical shift imaging. Gallbladder is unremarkable. No evidence of biliary ductal dilatation. Pancreas: No mass or inflammatory changes. No evidence of pancreatic ductal dilatation. Spleen:  Within normal limits in size and appearance. Adrenals/Urinary Tract: No masses identified. No evidence of hydronephrosis. Stomach/Bowel: Prior gastric bypass surgery noted. Otherwise unremarkable. Vascular/Lymphatic: No pathologically enlarged lymph nodes identified. No acute vascular findings. Other:  None. Musculoskeletal:  No suspicious bone lesions identified. IMPRESSION: No acute findings. No hepatobiliary or other significant abnormality. Electronically Signed   By: JoMarlaine Hind.D.   On: 01/24/2022 15:04    Labs:  CBC: Recent Labs    11/28/21 1022 12/24/21 1041 02/07/22 1142  WBC 2.7* 4.1 4.5  HGB 13.4 13.7 13.6  HCT 41.7 40.8 41.0  PLT 141* 178 179    COAGS: Recent Labs    12/24/21 1041 02/07/22 1142  INR 1.1 1.1    BMP: Recent Labs    11/28/21 1022 12/24/21 1041 02/07/22 1142  NA 136 138 140  K 4.4 4.7 4.6  CL 105 101 105  CO2 25 30 26   GLUCOSE 199* 83 86  BUN 10 12 14   CALCIUM 8.9 9.6 9.3  CREATININE 1.07* 1.01* 0.91  GFRNONAA >60  --  >60    LIVER FUNCTION TESTS: Recent Labs    11/28/21 1022 12/24/21 1041 02/07/22 1142  BILITOT 2.3* 4.1* 1.7*  AST 378* 319* 54*  ALT 564* 559* 67*  ALKPHOS 106  --  91  PROT 5.9* 6.4 7.0  ALBUMIN 3.3*  --  3.7    TUMOR MARKERS: No results for input(s): AFPTM, CEA, CA199, CHROMGRNA in the last 8760 hours.  Assessment and  Plan: 51 y.o. female with persistent nausea and vomiting as well as elevated LFTs with unclear etiology, who is in need of a notable biopsy for further  evaluation and management.  Patient presents with below IR today for the random renal biopsy. N.p.o. since midnight VSS CBC stable CMP pending INR 1.1 Not on AC/AP  Risks and benefits of random liver biopsy was discussed with the patient and/or patient's family including, but not limited to bleeding, infection, damage to adjacent structures or low yield requiring additional tests.  All of the questions were answered and there is agreement to proceed.  Consent signed and in chart.    Thank you for this interesting consult.  I greatly enjoyed meeting ANVIKA GASHI and look forward to participating in their care.  A copy of this report was sent to the requesting provider on this date.  Electronically Signed: Tera Mater, PA-C 02/07/2022, 12:40 PM   I spent a total of  30 Minutes   in face to face in clinical consultation, greater than 50% of which was counseling/coordinating care for random renal biopsy.  This chart was dictated using voice recognition software.  Despite best efforts to proofread,  errors can occur which can change the documentation meaning.

## 2022-02-07 NOTE — Procedures (Signed)
Pre Procedure Dx: Elevated LFTs Post Procedural Dx: Same  Technically successful US guided biopsy of right lobe of the liver.  EBL: None  No immediate complications.   Jay Devarion Mcclanahan, MD Pager #: [phone]    

## 2022-02-13 ENCOUNTER — Encounter (HOSPITAL_COMMUNITY)
Admission: RE | Disposition: A | Discharge status: Home / Self Care | Payer: Self-pay | Source: Home / Self Care | Attending: Gastroenterology

## 2022-02-13 ENCOUNTER — Ambulatory Visit (HOSPITAL_COMMUNITY): Payer: BC Managed Care – PPO | Admitting: Anesthesiology

## 2022-02-13 ENCOUNTER — Encounter (HOSPITAL_COMMUNITY): Payer: Self-pay | Admitting: Gastroenterology

## 2022-02-13 ENCOUNTER — Ambulatory Visit (HOSPITAL_COMMUNITY)
Admission: RE | Admit: 2022-02-13 | Discharge: 2022-02-13 | Disposition: A | Discharge status: Home / Self Care | Payer: BC Managed Care – PPO | Attending: Gastroenterology | Admitting: Gastroenterology

## 2022-02-13 ENCOUNTER — Other Ambulatory Visit: Payer: Self-pay

## 2022-02-13 DIAGNOSIS — F319 Bipolar disorder, unspecified: Secondary | ICD-10-CM | POA: Insufficient documentation

## 2022-02-13 DIAGNOSIS — K219 Gastro-esophageal reflux disease without esophagitis: Secondary | ICD-10-CM | POA: Insufficient documentation

## 2022-02-13 DIAGNOSIS — R1013 Epigastric pain: Secondary | ICD-10-CM | POA: Diagnosis not present

## 2022-02-13 DIAGNOSIS — Z9884 Bariatric surgery status: Secondary | ICD-10-CM | POA: Diagnosis not present

## 2022-02-13 DIAGNOSIS — R12 Heartburn: Secondary | ICD-10-CM

## 2022-02-13 DIAGNOSIS — G4733 Obstructive sleep apnea (adult) (pediatric): Secondary | ICD-10-CM | POA: Diagnosis not present

## 2022-02-13 DIAGNOSIS — R112 Nausea with vomiting, unspecified: Secondary | ICD-10-CM | POA: Diagnosis not present

## 2022-02-13 DIAGNOSIS — K2289 Other specified disease of esophagus: Secondary | ICD-10-CM

## 2022-02-13 DIAGNOSIS — F419 Anxiety disorder, unspecified: Secondary | ICD-10-CM | POA: Diagnosis not present

## 2022-02-13 DIAGNOSIS — K449 Diaphragmatic hernia without obstruction or gangrene: Secondary | ICD-10-CM

## 2022-02-13 DIAGNOSIS — D649 Anemia, unspecified: Secondary | ICD-10-CM | POA: Diagnosis not present

## 2022-02-13 DIAGNOSIS — F909 Attention-deficit hyperactivity disorder, unspecified type: Secondary | ICD-10-CM | POA: Diagnosis not present

## 2022-02-13 DIAGNOSIS — K2270 Barrett's esophagus without dysplasia: Secondary | ICD-10-CM | POA: Diagnosis not present

## 2022-02-13 DIAGNOSIS — I10 Essential (primary) hypertension: Secondary | ICD-10-CM | POA: Insufficient documentation

## 2022-02-13 HISTORY — PX: ESOPHAGOGASTRODUODENOSCOPY (EGD) WITH PROPOFOL: SHX5813

## 2022-02-13 HISTORY — PX: BIOPSY: SHX5522

## 2022-02-13 SURGERY — ESOPHAGOGASTRODUODENOSCOPY (EGD) WITH PROPOFOL
Anesthesia: General

## 2022-02-13 MED ORDER — LACTATED RINGERS IV SOLN: INTRAVENOUS | Status: DC

## 2022-02-13 MED ORDER — PROPOFOL 500 MG/50ML IV EMUL
INTRAVENOUS | Status: AC
  Filled 2022-02-13: qty 50

## 2022-02-13 MED ORDER — PROPOFOL 10 MG/ML IV BOLUS
INTRAVENOUS | Status: DC | PRN
  Administered 2022-02-13: 100 mg via INTRAVENOUS

## 2022-02-13 MED ORDER — LACTATED RINGERS IV SOLN: INTRAVENOUS | Status: DC | PRN

## 2022-02-13 MED ORDER — PROPOFOL 500 MG/50ML IV EMUL
INTRAVENOUS | Status: DC | PRN
  Administered 2022-02-13: 200 ug/kg/min via INTRAVENOUS

## 2022-02-13 NOTE — Anesthesia Preprocedure Evaluation (Signed)
Anesthesia Evaluation  Patient identified by MRN, date of birth, ID band Patient awake    Reviewed: Allergy & Precautions, H&P , NPO status , Patient's Chart, lab work & pertinent test results, reviewed documented beta blocker date and time   Airway Mallampati: II  TM Distance: >3 FB Neck ROM: full    Dental no notable dental hx.    Pulmonary sleep apnea ,    Pulmonary exam normal breath sounds clear to auscultation       Cardiovascular Exercise Tolerance: Good hypertension, negative cardio ROS   Rhythm:regular Rate:Normal     Neuro/Psych PSYCHIATRIC DISORDERS Anxiety Depression Bipolar Disorder negative neurological ROS     GI/Hepatic Neg liver ROS, GERD  Medicated,  Endo/Other  negative endocrine ROS  Renal/GU negative Renal ROS  negative genitourinary   Musculoskeletal   Abdominal   Peds  Hematology  (+) Blood dyscrasia, anemia ,   Anesthesia Other Findings   Reproductive/Obstetrics negative OB ROS                             Anesthesia Physical Anesthesia Plan  ASA: 2  Anesthesia Plan: General   Post-op Pain Management:    Induction:   PONV Risk Score and Plan: Propofol infusion  Airway Management Planned:   Additional Equipment:   Intra-op Plan:   Post-operative Plan:   Informed Consent: I have reviewed the patients History and Physical, chart, labs and discussed the procedure including the risks, benefits and alternatives for the proposed anesthesia with the patient or authorized representative who has indicated his/her understanding and acceptance.     Dental Advisory Given  Plan Discussed with: CRNA  Anesthesia Plan Comments:         Anesthesia Quick Evaluation

## 2022-02-13 NOTE — Op Note (Signed)
[facility] Patient Name: Theresa [name] Procedure Date: 02/13/2022 9:18 AM MRN: [id number] Date of Birth: 1971/07/09 Attending MD: [name] ,  CSN: [id number] Age: 51 Admit Type: Outpatient Procedure:                Upper GI endoscopy Indications:              Epigastric abdominal pain, Nausea with vomiting Providers:                [name], Janeece Riggers, RN, Lurline Del, RN Referring MD:              Medicines:                Monitored Anesthesia Care Complications:            No immediate complications. Estimated Blood Loss:     Estimated blood loss: none. Procedure:                Pre-Anesthesia Assessment:                           - Prior to the procedure, a History and Physical                            was performed, and patient medications, allergies                            and sensitivities were reviewed. The patient's                            tolerance of previous anesthesia was reviewed.                           - The risks and benefits of the procedure and the                            sedation options and risks were discussed with the                            patient. All questions were answered and informed                            consent was obtained.                           - ASA Grade Assessment: II - A patient with mild                            systemic disease.                           After obtaining informed consent, the endoscope was                            passed under direct vision. Throughout the                            procedure,  the patient's blood pressure, pulse, and                            oxygen saturations were monitored continuously. The                            GIF-H190 ([id number]) scope was introduced through the                            mouth, and advanced to the second part of duodenum.                            The upper GI endoscopy was accomplished without                             difficulty. The patient tolerated the procedure                            well. Scope In: 9:31:28 AM Scope Out: 9:37:32 AM Total Procedure Duration: 0 hours 6 minutes 4 seconds  Findings:      A 1 cm hiatal hernia was present.      The esophagus and gastroesophageal junction were examined with white       light and narrow band imaging (NBI). There were esophageal mucosal       changes suspicious for short-segment Barrett's esophagus. These changes       involved the mucosa extending to the Z-line (38 cm from the incisors).       Two tongues of salmon-colored mucosa were present from 32 to 33 cm. The       maximum longitudinal extent of these esophageal mucosal changes was 1 cm       in length. Mucosa was biopsied with a cold forceps for histology in a       targeted manner. One specimen bottle was sent to pathology.      Evidence of a gastric bypass was found. A gastric pouch was found       measuring 5 cm. The staple line appeared intact. The gastrojejunal       anastomosis was characterized by healthy appearing mucosa. This was       traversed. The jejunojejunal anastomosis was characterized by healthy       appearing mucosa. Biopsies were taken with a cold forceps for       Helicobacter pylori testing.      The examined jejunum was normal. Impression:               - 1 cm hiatal hernia.                           - Esophageal mucosal changes suspicious for                            short-segment Barrett's esophagus. Biopsied.                           - Gastric bypass with intact staple line.  Gastrojejunal anastomosis characterized by healthy                            appearing mucosa. Biopsied.                           - Normal examined jejunum. Moderate Sedation:      Per Anesthesia Care Recommendation:           - Discharge patient to home (ambulatory).                           - Resume previous diet.                           - Await pathology  results.                           - Continue present medications.                           - Will follow result of liver biopsy.                           - Stop ibuprofen Procedure Code(s):        --- Professional ---                           [phone], Esophagogastroduodenoscopy, flexible,                            transoral; with biopsy, single or multiple Diagnosis Code(s):        --- Professional ---                           K44.9, Diaphragmatic hernia without obstruction or                            gangrene                           K22.8, Other specified diseases of esophagus                           Z98.84, Bariatric surgery status                           R10.13, Epigastric pain                           R11.2, Nausea with vomiting, unspecified CPT copyright 2019 [employer]. All rights reserved. The codes documented in this report are preliminary and upon coder review may  be revised to meet current compliance requirements. [name], MD [name],  02/13/2022 9:45:37 AM This report has been signed electronically. Number of Addenda: 0

## 2022-02-13 NOTE — H&P (Signed)
Theresa [name] is an 51 y.o. female.   Chief Complaint: Nausea, vomiting, abdominal pain and heartburn HPI: Theresa [name] is a 51 y.o. female with past medical history of bipolar disorder, anxiety, attention deficit disorder, depression, obesity s/p gastric bypass, GERD, hyperlipidemia, hypertension and sleep apnea, who comes for evaluation of nausea, vomiting, abdominal pain and heartburn.  States that her nausea has improved with Zofran but is still present. Also endorses having abodminal pain in the upper  abdomen frequently and heatrburn. Had negative CT abdomen and MRCP.  Past Medical History:  Diagnosis Date   Anemia    Anxiety    Attention deficit disorder    Bipolar disorder ([place])    currently no meds- agitation noted during PAT assessment   Depression    GERD (gastroesophageal reflux disease)    resolved   H/O transfusion    17 yrs ago postpartum   Hyperlipidemia    resolved   Hypertension    history of HTN prior to gastric bypass   Hypoglycemia    Left breast abscess 01/22/2017   Sleep apnea, obstructive    cpap used auto settings, resolved gastric bypass    Past Surgical History:  Procedure Laterality Date   ABDOMINAL HYSTERECTOMY     ABDOMINOPLASTY     AUGMENTATION MAMMAPLASTY Bilateral 2016   BREAST BIOPSY Left 01/2017   abscess removed   CESAREAN SECTION     COLON SURGERY     COLONOSCOPY WITH PROPOFOL N/A 07/04/2021   Procedure: COLONOSCOPY WITH PROPOFOL;  Surgeon: Harvel Quale, MD;  [facility];  Service: Gastroenterology;  Laterality: N/A;  2:00 pm   GASTRIC ROUX-EN-Y N/A 12/06/2013   Procedure: LAPAROSCOPIC ROUX-EN-Y GASTRIC BYPASS WITH UPPER ENDOSCOPY;  Surgeon: Gayland Curry, MD;  Location: [facility];  Service: General;  Laterality: N/A;   INCISION AND DRAINAGE ABSCESS Left 01/22/2017   Procedure: INCISION AND DRAINAGE LEFT  BREAST ABSCESS;  Surgeon: Fanny Skates, MD;  Location: [place];  Service: General;   Laterality: Left;   LAPAROTOMY N/A 02/18/2017   Procedure: LAPAROTOMY;  Surgeon: Molli Posey, MD;  Location: [facility];  Service: Gynecology;  Laterality: N/A;   POLYPECTOMY  07/04/2021   Procedure: POLYPECTOMY;  Surgeon: Harvel Quale, MD;  [facility];  Service: Gastroenterology;;   SALPINGOOPHORECTOMY Bilateral 02/18/2017   Procedure: BILATERAL SALPINGO OOPHORECTOMY;  Surgeon: Molli Posey, MD;  Location: [facility];  Service: Gynecology;  Laterality: Bilateral;   TUBAL LIGATION     UNILATERAL SALPINGECTOMY Right 09/05/2015   Procedure: UNILATERAL SALPINGECTOMY;  Surgeon: Molli Posey, MD;  Location: [facility];  Service: Gynecology;  Laterality: Right;   VAGINAL HYSTERECTOMY N/A 09/05/2015   Procedure: HYSTERECTOMY VAGINAL;  Surgeon: Molli Posey, MD;  Location: [facility];  Service: Gynecology;  Laterality: N/A;   WISDOM TOOTH EXTRACTION      Family History  Problem Relation Age of Onset   Hypertension Mother    Cancer Mother 71       breast   Diabetes Mother    Hyperlipidemia Mother    Thyroid disease Mother    Breast cancer Mother 62   Depression Mother    Hypertension Father    Diabetes Father    Heart disease Father    Stroke Father    Depression Father    Bipolar disorder Father    Cancer Maternal Grandmother        Breast   Hyperlipidemia Maternal Grandmother    Heart disease Maternal Grandmother  Stroke Maternal Grandmother    Hypertension Maternal Grandmother    Breast cancer Maternal Grandmother 36   Hyperlipidemia Maternal Grandfather    Hypertension Maternal Grandfather    Cancer Paternal Grandmother        Colon   Hyperlipidemia Paternal Grandmother    Heart disease Paternal Grandmother    Hyperlipidemia Paternal Grandfather    Hypertension Paternal Grandfather    Breast cancer Paternal Aunt    Social History:  reports that she has never smoked. She has never used smokeless tobacco. She reports current alcohol use. She  reports that she does not use drugs.  Allergies:  Allergies  Allergen Reactions   Other Hives    Needs benadryl when given narcotics    Medications Prior to Admission  Medication Sig Dispense Refill   buPROPion (WELLBUTRIN SR) 150 MG 12 hr tablet TAKE 1 TABLET BY MOUTH TWICE A DAY 180 tablet 1   estradiol (ESTRACE) 1 MG tablet Take 1 tablet (1 mg total) by mouth 2 (two) times daily. 180 tablet 0   gabapentin (NEURONTIN) 600 MG tablet TAKE 1 TABLET BY MOUTH IN THE MORNING TAKE 1 TABLET IN THE EVENING AND TAKE 2 TABLETS AT BEDTIME (Patient taking differently: Take 600-1,200 mg by mouth See admin instructions. TAKE  600 MG BY MOUTH IN THE MORNING AND AT BEDTIME, 1200 MG AT LUNCHTIME) 120 tablet 3   lisinopril (ZESTRIL) 20 MG tablet Take 1 tablet (20 mg total) by mouth daily. 90 tablet 0   lubiprostone (AMITIZA) 8 MCG capsule Take 1 capsule (8 mcg total) by mouth 2 (two) times daily with a meal. 180 capsule 1   metoprolol succinate (TOPROL-XL) 25 MG 24 hr tablet TAKE 1 TABLET BY MOUTH EVERY DAY 90 tablet 0   pantoprazole (PROTONIX) 40 MG tablet Take 1 tablet (40 mg total) by mouth daily. 90 tablet 3   sodium chloride (MURO 128) 2 % ophthalmic solution Place 1 drop into both eyes in the morning, at noon, and at bedtime.     traZODone (DESYREL) 100 MG tablet Take 50 mg by mouth at bedtime.     ALPRAZolam (XANAX) 0.5 MG tablet Take 0.5 mg by mouth daily as needed for anxiety.     ferrous sulfate 325 (65 FE) MG EC tablet Take 325 mg by mouth daily with breakfast.     ibuprofen (ADVIL) 200 MG tablet Take 400 mg by mouth every 6 (six) hours as needed for headache.     methylphenidate (RITALIN) 20 MG tablet Take 20 mg by mouth daily as needed (Focus).  0    No results found for this or any previous visit (from the past 48 hour(s)). No results found.  Review of Systems  Constitutional: Negative.   HENT: Negative.    Eyes: Negative.   Respiratory: Negative.    Cardiovascular: Negative.    Gastrointestinal: Negative.   Endocrine: Negative.   Genitourinary: Negative.   Musculoskeletal: Negative.   Skin: Negative.   Allergic/Immunologic: Negative.   Neurological: Negative.   Hematological: Negative.   Psychiatric/Behavioral: Negative.     Blood pressure (!) 139/92, pulse 84, temperature (!) 97.5 F (36.4 C), temperature source Oral, resp. rate 13, height 5' 5"  (1.651 m), weight 66.7 kg, last menstrual period 10/24/2014, SpO2 98 %. Physical Exam  GENERAL: The patient is AO x3, in no acute distress. HEENT: Head is normocephalic and atraumatic. EOMI are intact. Mouth is well hydrated and without lesions. NECK: Supple. No masses LUNGS: Clear to auscultation. No presence of rhonchi/wheezing/rales.  Adequate chest expansion HEART: RRR, normal s1 and s2. ABDOMEN: Soft, nontender, no guarding, no peritoneal signs, and nondistended. BS +. No masses. EXTREMITIES: Without any cyanosis, clubbing, rash, lesions or edema. NEUROLOGIC: AOx3, no focal motor deficit. SKIN: no jaundice, no rashes  Assessment/Plan MARYJEAN CORPENING is a 51 y.o. female with past medical history of bipolar disorder, anxiety, attention deficit disorder, depression, obesity s/p gastric bypass, GERD, hyperlipidemia, hypertension and sleep apnea, who comes for evaluation of nausea, vomiting, abdominal pain and heartburn.  We will proceed with EGD.  Harvel Quale, MD 02/13/2022, 7:33 AM

## 2022-02-13 NOTE — Transfer of Care (Signed)
Immediate Anesthesia Transfer of Care Note  Patient: NOVEMBER SYPHER  Procedure(s) Performed: ESOPHAGOGASTRODUODENOSCOPY (EGD) WITH PROPOFOL BIOPSY  Patient Location: PACU  Anesthesia Type:General  Level of Consciousness: awake, alert  and oriented  Airway & Oxygen Therapy: Patient Spontanous Breathing  Post-op Assessment: Report given to RN, Post -op Vital signs reviewed and stable, Patient moving all extremities X 4 and Patient able to stick tongue midline  Post vital signs: Reviewed  Last Vitals:  Vitals Value Taken Time  BP 112/68 02/13/22 0940  Temp 36.7 C 02/13/22 0940  Pulse 91   Resp 22 02/13/22 0940  SpO2 98 % 02/13/22 0940    Last Pain:  Vitals:   02/13/22 0940  TempSrc: Oral  PainSc: 0-No pain      Patients Stated Pain Goal: 9 (56/25/63 8937)  Complications: No notable events documented.

## 2022-02-13 NOTE — Discharge Instructions (Addendum)
You are being discharged to home.  Resume your previous diet.  We are waiting for your pathology results.  Continue your present medications. Increase Protonix to one pill twice a day Will follow result of liver biopsy. Stop ibuprofen

## 2022-02-14 ENCOUNTER — Encounter: Payer: Self-pay | Admitting: Urology

## 2022-02-14 ENCOUNTER — Ambulatory Visit (INDEPENDENT_AMBULATORY_CARE_PROVIDER_SITE_OTHER): Payer: BC Managed Care – PPO | Admitting: Urology

## 2022-02-14 VITALS — BP 147/89 | HR 108 | Ht 65.0 in | Wt 147.0 lb

## 2022-02-14 DIAGNOSIS — R102 Pelvic and perineal pain: Secondary | ICD-10-CM

## 2022-02-14 DIAGNOSIS — G8929 Other chronic pain: Secondary | ICD-10-CM | POA: Diagnosis not present

## 2022-02-14 LAB — BLADDER SCAN AMB NON-IMAGING: Scan Result: 149

## 2022-02-14 LAB — URINALYSIS, ROUTINE W REFLEX MICROSCOPIC
Bilirubin, UA: NEGATIVE
Glucose, UA: NEGATIVE
Ketones, UA: NEGATIVE
Leukocytes,UA: NEGATIVE
Nitrite, UA: NEGATIVE
Protein,UA: NEGATIVE
RBC, UA: NEGATIVE
Specific Gravity, UA: 1.02 (ref 1.005–1.030)
Urobilinogen, Ur: >8 mg/dL — ABNORMAL HIGH (ref 0.2–1.0)
pH, UA: 7 (ref 5.0–7.5)

## 2022-02-14 LAB — SURGICAL PATHOLOGY

## 2022-02-14 MED ORDER — MIRABEGRON ER 25 MG PO TB24: 25.0000 mg | ORAL_TABLET | Freq: Every day | ORAL | 0 refills | Status: DC

## 2022-02-14 NOTE — Anesthesia Postprocedure Evaluation (Signed)
Anesthesia Post Note  Patient: Theresa [name]  Procedure(s) Performed: ESOPHAGOGASTRODUODENOSCOPY (EGD) WITH PROPOFOL BIOPSY  Patient location during evaluation: Phase II Anesthesia Type: General Level of consciousness: awake Pain management: pain level controlled Vital Signs Assessment: post-procedure vital signs reviewed and stable Respiratory status: spontaneous breathing and respiratory function stable Cardiovascular status: blood pressure returned to baseline and stable Postop Assessment: no headache and no apparent nausea or vomiting Anesthetic complications: no Comments: Late entry   No notable events documented.   Last Vitals:  Vitals:   02/13/22 0655 02/13/22 0940  BP: (!) 139/92 112/68  Pulse: 84   Resp: 13 (!) 22  Temp: (!) 36.4 C 36.7 C  SpO2: 98% 98%    Last Pain:  Vitals:   02/13/22 0940  TempSrc: Oral  PainSc: 0-No pain                 Louann Sjogren

## 2022-02-14 NOTE — Progress Notes (Signed)
Assessment: 1. Pelvic pain in female     Plan: Her symptoms are not typical for a bladder etiology given her lack of voiding symptoms and location of her pain. Trial of Myrbetriq 25 mg daily.  Samples provided Return to office in 3 weeks for cystoscopy  Chief Complaint:  Chief Complaint  Patient presents with   Dysuria    History of Present Illness:  Theresa [name] is a 51 y.o. year old female who is seen in consultation from Tilda Burrow, NP for evaluation of chronic pelvic pain.  She has had symptoms for 4 years.  She has pain in the left lower quadrant which radiates across her lower abdomen and suprapubic region.  The pain is chronic in nature and is generally at a 7/10 in intensity.  She has increased pain after voiding to a 10/10.  The pain makes it difficult for her to stand after voiding.  She does not have any frequency, urgency, nocturia, dysuria, incontinence, gross hematuria, or dyspareunia.   CT from 3/23 showed normal kidneys bilaterally without obstruction or stones and a unremarkable bladder.  She has had a hysterectomy and subsequent bilateral oophorectomy.  Past Medical History:  Past Medical History:  Diagnosis Date   Anemia    Anxiety    Attention deficit disorder    Bipolar disorder ([place])    currently no meds- agitation noted during PAT assessment   Depression    GERD (gastroesophageal reflux disease)    resolved   H/O transfusion    17 yrs ago postpartum   Hyperlipidemia    resolved   Hypertension    history of HTN prior to gastric bypass   Hypoglycemia    Left breast abscess 01/22/2017   Sleep apnea, obstructive    cpap used auto settings, resolved gastric bypass    Past Surgical History:  Past Surgical History:  Procedure Laterality Date   ABDOMINAL HYSTERECTOMY     ABDOMINOPLASTY     AUGMENTATION MAMMAPLASTY Bilateral 2016   BREAST BIOPSY Left 01/2017   abscess removed   CESAREAN SECTION     COLON SURGERY     COLONOSCOPY  WITH PROPOFOL N/A 07/04/2021   Procedure: COLONOSCOPY WITH PROPOFOL;  Surgeon: Harvel Quale, MD;  [facility];  Service: Gastroenterology;  Laterality: N/A;  2:00 pm   GASTRIC ROUX-EN-Y N/A 12/06/2013   Procedure: LAPAROSCOPIC ROUX-EN-Y GASTRIC BYPASS WITH UPPER ENDOSCOPY;  Surgeon: Gayland Curry, MD;  [facility];  Service: General;  Laterality: N/A;   INCISION AND DRAINAGE ABSCESS Left 01/22/2017   Procedure: INCISION AND DRAINAGE LEFT  BREAST ABSCESS;  Surgeon: Fanny Skates, MD;  Location: [facility] [facility];  Service: General;  Laterality: Left;   LAPAROTOMY N/A 02/18/2017   Procedure: LAPAROTOMY;  Surgeon: Molli Posey, MD;  Location: [facility];  Service: Gynecology;  Laterality: N/A;   POLYPECTOMY  07/04/2021   Procedure: POLYPECTOMY;  Surgeon: Harvel Quale, MD;  [facility];  Service: Gastroenterology;;   SALPINGOOPHORECTOMY Bilateral 02/18/2017   Procedure: BILATERAL SALPINGO OOPHORECTOMY;  Surgeon: Molli Posey, MD;  Location: [facility];  Service: Gynecology;  Laterality: Bilateral;   TUBAL LIGATION     UNILATERAL SALPINGECTOMY Right 09/05/2015   Procedure: UNILATERAL SALPINGECTOMY;  Surgeon: Molli Posey, MD;  Location: [facility];  Service: Gynecology;  Laterality: Right;   VAGINAL HYSTERECTOMY N/A 09/05/2015   Procedure: HYSTERECTOMY VAGINAL;  Surgeon: Molli Posey, MD;  Location: [facility];  Service: Gynecology;  Laterality: N/A;   WISDOM  TOOTH EXTRACTION      Allergies:  Allergies  Allergen Reactions   Other Hives    Needs benadryl when given narcotics    Family History:  Family History  Problem Relation Age of Onset   Hypertension Mother    Cancer Mother 72       breast   Diabetes Mother    Hyperlipidemia Mother    Thyroid disease Mother    Breast cancer Mother 27   Depression Mother    Hypertension Father    Diabetes Father    Heart disease Father    Stroke Father    Depression Father     Bipolar disorder Father    Cancer Maternal Grandmother        Breast   Hyperlipidemia Maternal Grandmother    Heart disease Maternal Grandmother    Stroke Maternal Grandmother    Hypertension Maternal Grandmother    Breast cancer Maternal Grandmother 28   Hyperlipidemia Maternal Grandfather    Hypertension Maternal Grandfather    Cancer Paternal Grandmother        Colon   Hyperlipidemia Paternal Grandmother    Heart disease Paternal Grandmother    Hyperlipidemia Paternal Grandfather    Hypertension Paternal Grandfather    Breast cancer Paternal Aunt     Social History:  Social History   Tobacco Use   Smoking status: Never   Smokeless tobacco: Never  Vaping Use   Vaping Use: Never used  Substance Use Topics   Alcohol use: Yes    Comment: rare-occ. monthly   Drug use: No    Review of symptoms:  Constitutional:  Negative for unexplained weight loss, night sweats, fever, chills ENT:  Negative for nose bleeds, sinus pain, painful swallowing CV:  Negative for chest pain, shortness of breath, exercise intolerance, palpitations, loss of consciousness Resp:  Negative for cough, wheezing, shortness of breath GI:  Negative for nausea, vomiting, diarrhea, bloody stools GU:  Positives noted in HPI; otherwise negative for gross hematuria, dysuria, urinary incontinence Neuro:  Negative for seizures, poor balance, limb weakness, slurred speech Psych:  Negative for lack of energy, depression, anxiety Endocrine:  Negative for polydipsia, polyuria, symptoms of hypoglycemia (dizziness, hunger, sweating) Hematologic:  Negative for anemia, purpura, petechia, prolonged or excessive bleeding, use of anticoagulants  Allergic:  Negative for difficulty breathing or choking as a result of exposure to anything; no shellfish allergy; no allergic response (rash/itch) to materials, foods  Physical exam: BP (!) 147/89   Pulse (!) 108   Ht 5' 5"  (1.651 m)   Wt 147 lb (66.7 kg)   LMP  (LMP Unknown)  Comment: AUB  BMI 24.46 kg/m  GENERAL APPEARANCE:  Well appearing, well developed, well nourished, NAD HEENT: Atraumatic, Normocephalic, oropharynx clear. NECK: Supple without lymphadenopathy or thyromegaly. LUNGS: Clear to auscultation bilaterally. HEART: Regular Rate and Rhythm without murmurs, gallops, or rubs. ABDOMEN: Soft, non-tender, No Masses. EXTREMITIES: Moves all extremities well.  Without clubbing, cyanosis, or edema. NEUROLOGIC:  Alert and oriented x 3, normal gait, CN II-XII grossly intact.  MENTAL STATUS:  Appropriate. BACK:  Non-tender to palpation.  No CVAT SKIN:  Warm, dry and intact.    Results: U/A dipstick negative   PVR = 149 ml

## 2022-02-14 NOTE — Progress Notes (Signed)
post void residual =154m

## 2022-02-15 LAB — SURGICAL PATHOLOGY

## 2022-02-18 ENCOUNTER — Other Ambulatory Visit (INDEPENDENT_AMBULATORY_CARE_PROVIDER_SITE_OTHER): Payer: Self-pay | Admitting: Gastroenterology

## 2022-02-18 DIAGNOSIS — K754 Autoimmune hepatitis: Secondary | ICD-10-CM | POA: Insufficient documentation

## 2022-02-20 ENCOUNTER — Other Ambulatory Visit (INDEPENDENT_AMBULATORY_CARE_PROVIDER_SITE_OTHER): Payer: Self-pay | Admitting: Gastroenterology

## 2022-02-20 ENCOUNTER — Encounter (HOSPITAL_COMMUNITY): Payer: Self-pay | Admitting: Gastroenterology

## 2022-02-20 DIAGNOSIS — R109 Unspecified abdominal pain: Secondary | ICD-10-CM

## 2022-02-20 MED ORDER — DICYCLOMINE HCL 20 MG PO TABS: 10.0000 mg | ORAL_TABLET | Freq: Two times a day (BID) | ORAL | 1 refills | Status: DC | PRN

## 2022-03-08 ENCOUNTER — Ambulatory Visit (INDEPENDENT_AMBULATORY_CARE_PROVIDER_SITE_OTHER): Payer: BC Managed Care – PPO | Admitting: Urology

## 2022-03-08 ENCOUNTER — Encounter: Payer: Self-pay | Admitting: Urology

## 2022-03-08 VITALS — BP 117/85 | HR 82

## 2022-03-08 DIAGNOSIS — R102 Pelvic and perineal pain: Secondary | ICD-10-CM

## 2022-03-08 LAB — COMPREHENSIVE METABOLIC PANEL
AG Ratio: 1.8 (calc) (ref 1.0–2.5)
ALT: 37 U/L — ABNORMAL HIGH (ref 6–29)
AST: 34 U/L (ref 10–35)
Albumin: 4 g/dL (ref 3.6–5.1)
Alkaline phosphatase (APISO): 88 U/L (ref 31–125)
BUN: 10 mg/dL (ref 7–25)
CO2: 28 mmol/L (ref 20–32)
Calcium: 9.2 mg/dL (ref 8.6–10.2)
Chloride: 104 mmol/L (ref 98–110)
Creat: 0.93 mg/dL (ref 0.50–0.99)
Globulin: 2.2 g/dL (calc) (ref 1.9–3.7)
Glucose, Bld: 94 mg/dL (ref 65–99)
Potassium: 4.4 mmol/L (ref 3.5–5.3)
Sodium: 139 mmol/L (ref 135–146)
Total Bilirubin: 1.1 mg/dL (ref 0.2–1.2)
Total Protein: 6.2 g/dL (ref 6.1–8.1)

## 2022-03-08 LAB — THIOPURINE METHYLTRANSFERASE (TPMT), RBC: Thiopurine Methyltransferase, RBC: 14 nmol/hr/mL RBC

## 2022-03-08 MED ORDER — CIPROFLOXACIN HCL 500 MG PO TABS
500.0000 mg | ORAL_TABLET | Freq: Once | ORAL | Status: AC
  Administered 2022-03-08: 500 mg via ORAL

## 2022-03-08 NOTE — Progress Notes (Signed)
Assessment: 1. Pelvic pain in female     Plan: I discussed the findings of normal cystoscopy with the patient today. We again discussed that her pelvic pain does not seem to be urologic in nature. Recommend consideration for physical therapy for management of her chronic pelvic pain. Refer to [facility] for pelvic floor PT evaluation Return to office in 2 months  Chief Complaint:  Chief Complaint  Patient presents with   Pelvic Pain    History of Present Illness:  Theresa [name] is a 51 y.o. year old female who is seen for further evaluation of chronic pelvic pain.  She has had symptoms for 4 years.  She has pain in the left lower quadrant which radiates across her lower abdomen and suprapubic region.  The pain is chronic in nature and is generally at a 7/10 in intensity.  She has increased pain after voiding to a 10/10.  The pain makes it difficult for her to stand after voiding.  She does not have any frequency, nocturia, dysuria, incontinence, gross hematuria, or dyspareunia.   CT from 3/23 showed normal kidneys bilaterally without obstruction or stones and a unremarkable bladder.  She has had a hysterectomy and subsequent bilateral oophorectomy. She was given a trial of Myrbetriq in June 2023.  Her pelvic pain remains unchanged.  Her pelvic pain is fairly constant and is not associated with a full bladder.  She does feel some urgency.  No dysuria or gross hematuria.  She did not see any change in her symptoms with Myrbetriq. She presents today for further evaluation with cystoscopy.  Portions of the above documentation were copied from a prior visit for review purposes only.   Past Medical History:  Past Medical History:  Diagnosis Date   Anemia    Anxiety    Attention deficit disorder    Bipolar disorder ([place])    currently no meds- agitation noted during PAT assessment   Depression    GERD (gastroesophageal reflux disease)    resolved   H/O transfusion     17 yrs ago postpartum   Hyperlipidemia    resolved   Hypertension    history of HTN prior to gastric bypass   Hypoglycemia    Left breast abscess 01/22/2017   Sleep apnea, obstructive    cpap used auto settings, resolved gastric bypass    Past Surgical History:  Past Surgical History:  Procedure Laterality Date   ABDOMINAL HYSTERECTOMY     ABDOMINOPLASTY     AUGMENTATION MAMMAPLASTY Bilateral 2016   BIOPSY  02/13/2022   Procedure: BIOPSY;  Surgeon: Harvel Quale, MD;  [facility];  Service: Gastroenterology;;   BREAST BIOPSY Left 01/2017   abscess removed   CESAREAN SECTION     COLON SURGERY     COLONOSCOPY WITH PROPOFOL N/A 07/04/2021   Procedure: COLONOSCOPY WITH PROPOFOL;  Surgeon: Harvel Quale, MD;  [facility];  Service: Gastroenterology;  Laterality: N/A;  2:00 pm   ESOPHAGOGASTRODUODENOSCOPY (EGD) WITH PROPOFOL N/A 02/13/2022   Procedure: ESOPHAGOGASTRODUODENOSCOPY (EGD) WITH PROPOFOL;  Surgeon: Harvel Quale, MD;  [facility];  Service: Gastroenterology;  Laterality: N/A;  815   GASTRIC ROUX-EN-Y N/A 12/06/2013   Procedure: LAPAROSCOPIC ROUX-EN-Y GASTRIC BYPASS WITH UPPER ENDOSCOPY;  Surgeon: Gayland Curry, MD;  Location: [facility];  Service: General;  Laterality: N/A;   INCISION AND DRAINAGE ABSCESS Left 01/22/2017   Procedure: INCISION AND DRAINAGE LEFT  BREAST ABSCESS;  Surgeon: Fanny Skates,  MD;  Location: [place];  Service: General;  Laterality: Left;   LAPAROTOMY N/A 02/18/2017   Procedure: LAPAROTOMY;  Surgeon: Molli Posey, MD;  Location: [facility];  Service: Gynecology;  Laterality: N/A;   POLYPECTOMY  07/04/2021   Procedure: POLYPECTOMY;  Surgeon: Harvel Quale, MD;  [facility];  Service: Gastroenterology;;   SALPINGOOPHORECTOMY Bilateral 02/18/2017   Procedure: BILATERAL SALPINGO OOPHORECTOMY;  Surgeon: Molli Posey, MD;  Location: [facility];   Service: Gynecology;  Laterality: Bilateral;   TUBAL LIGATION     UNILATERAL SALPINGECTOMY Right 09/05/2015   Procedure: UNILATERAL SALPINGECTOMY;  Surgeon: Molli Posey, MD;  Location: [facility];  Service: Gynecology;  Laterality: Right;   VAGINAL HYSTERECTOMY N/A 09/05/2015   Procedure: HYSTERECTOMY VAGINAL;  Surgeon: Molli Posey, MD;  Location: [facility];  Service: Gynecology;  Laterality: N/A;   WISDOM TOOTH EXTRACTION      Allergies:  Allergies  Allergen Reactions   Other Hives    Needs benadryl when given narcotics    Family History:  Family History  Problem Relation Age of Onset   Hypertension Mother    Cancer Mother 35       breast   Diabetes Mother    Hyperlipidemia Mother    Thyroid disease Mother    Breast cancer Mother 20   Depression Mother    Hypertension Father    Diabetes Father    Heart disease Father    Stroke Father    Depression Father    Bipolar disorder Father    Cancer Maternal Grandmother        Breast   Hyperlipidemia Maternal Grandmother    Heart disease Maternal Grandmother    Stroke Maternal Grandmother    Hypertension Maternal Grandmother    Breast cancer Maternal Grandmother 53   Hyperlipidemia Maternal Grandfather    Hypertension Maternal Grandfather    Cancer Paternal Grandmother        Colon   Hyperlipidemia Paternal Grandmother    Heart disease Paternal Grandmother    Hyperlipidemia Paternal Grandfather    Hypertension Paternal Grandfather    Breast cancer Paternal Aunt     Social History:  Social History   Tobacco Use   Smoking status: Never   Smokeless tobacco: Never  Vaping Use   Vaping Use: Never used  Substance Use Topics   Alcohol use: Yes    Comment: rare-occ. monthly   Drug use: No    ROS: Constitutional:  Negative for fever, chills, weight loss CV: Negative for chest pain, previous MI, hypertension Respiratory:  Negative for shortness of breath, wheezing, sleep apnea, frequent cough GI:  Negative for  nausea, vomiting, bloody stool, GERD  Physical exam: BP 117/85   Pulse 82   LMP  (LMP Unknown) Comment: AUB GENERAL APPEARANCE:  Well appearing, well developed, well nourished, NAD HEENT:  Atraumatic, normocephalic, oropharynx clear NECK:  Supple without lymphadenopathy or thyromegaly ABDOMEN:  Soft, non-tender, no masses EXTREMITIES:  Moves all extremities well, without clubbing, cyanosis, or edema NEUROLOGIC:  Alert and oriented x 3, normal gait, CN II-XII grossly intact MENTAL STATUS:  appropriate BACK:  Non-tender to palpation, No CVAT SKIN:  Warm, dry, and intact  Results: No specimen provided  Procedure:  Flexible Cystourethroscopy  Pre-operative Diagnosis:  Pelvic pain  Post-operative Diagnosis:  Pelvic pain  Anesthesia:  local with lidocaine jelly  Surgical Narrative:  After appropriate informed consent was obtained, the patient was prepped and draped in the usual sterile fashion in the supine position.  The patient was correctly identified and the proper procedure delineated prior to proceeding.  Sterile lidocaine gel was instilled in the urethra. The flexible cystoscope was introduced without difficulty.  Findings:  Urethra: Normal  Bladder: Normal  Ureteral orifices: normal  Additional findings:  None  Saline bladder wash for cytology was not performed.    The cystoscope was then removed.  The patient tolerated the procedure well.

## 2022-03-21 NOTE — Progress Notes (Deleted)
No chief complaint on file.     History of Present Illness: 51 yo female with history of anxiety, depression, GERD, bipolar disorder, HTN here today as a new consult, referred by ***, for the evaluation of ***.   Primary Care Physician: Tilda Burrow, NP   Past Medical History:  Diagnosis Date   Anemia    Anxiety    Attention deficit disorder    Bipolar disorder ([place])    currently no meds- agitation noted during PAT assessment   Depression    GERD (gastroesophageal reflux disease)    resolved   H/O transfusion    17 yrs ago postpartum   Hyperlipidemia    resolved   Hypertension    history of HTN prior to gastric bypass   Hypoglycemia    Left breast abscess 01/22/2017   Sleep apnea, obstructive    cpap used auto settings, resolved gastric bypass    Past Surgical History:  Procedure Laterality Date   ABDOMINAL HYSTERECTOMY     ABDOMINOPLASTY     AUGMENTATION MAMMAPLASTY Bilateral 2016   BIOPSY  02/13/2022   Procedure: BIOPSY;  Surgeon: Harvel Quale, MD;  [facility];  Service: Gastroenterology;;   BREAST BIOPSY Left 01/2017   abscess removed   CESAREAN SECTION     COLON SURGERY     COLONOSCOPY WITH PROPOFOL N/A 07/04/2021   Procedure: COLONOSCOPY WITH PROPOFOL;  Surgeon: Harvel Quale, MD;  [facility];  Service: Gastroenterology;  Laterality: N/A;  2:00 pm   ESOPHAGOGASTRODUODENOSCOPY (EGD) WITH PROPOFOL N/A 02/13/2022   Procedure: ESOPHAGOGASTRODUODENOSCOPY (EGD) WITH PROPOFOL;  Surgeon: Harvel Quale, MD;  [facility];  Service: Gastroenterology;  Laterality: N/A;  815   GASTRIC ROUX-EN-Y N/A 12/06/2013   Procedure: LAPAROSCOPIC ROUX-EN-Y GASTRIC BYPASS WITH UPPER ENDOSCOPY;  Surgeon: Gayland Curry, MD;  [facility];  Service: General;  Laterality: N/A;   INCISION AND DRAINAGE ABSCESS Left 01/22/2017   Procedure: INCISION AND DRAINAGE LEFT  BREAST ABSCESS;  Surgeon: Fanny Skates, MD;   Location: [facility];  Service: General;  Laterality: Left;   LAPAROTOMY N/A 02/18/2017   Procedure: LAPAROTOMY;  Surgeon: Molli Posey, MD;  Location: [facility];  Service: Gynecology;  Laterality: N/A;   POLYPECTOMY  07/04/2021   Procedure: POLYPECTOMY;  Surgeon: Harvel Quale, MD;  [facility];  Service: Gastroenterology;;   SALPINGOOPHORECTOMY Bilateral 02/18/2017   Procedure: BILATERAL SALPINGO OOPHORECTOMY;  Surgeon: Molli Posey, MD;  Location: [facility];  Service: Gynecology;  Laterality: Bilateral;   TUBAL LIGATION     UNILATERAL SALPINGECTOMY Right 09/05/2015   Procedure: UNILATERAL SALPINGECTOMY;  Surgeon: Molli Posey, MD;  Location: [facility];  Service: Gynecology;  Laterality: Right;   VAGINAL HYSTERECTOMY N/A 09/05/2015   Procedure: HYSTERECTOMY VAGINAL;  Surgeon: Molli Posey, MD;  Location: [facility];  Service: Gynecology;  Laterality: N/A;   WISDOM TOOTH EXTRACTION      Current Outpatient Medications  Medication Sig Dispense Refill   ALPRAZolam (XANAX) 0.5 MG tablet Take 0.5 mg by mouth daily as needed for anxiety.     buPROPion (WELLBUTRIN SR) 150 MG 12 hr tablet TAKE 1 TABLET BY MOUTH TWICE A DAY 180 tablet 1   dicyclomine (BENTYL) 20 MG tablet Take 0.5 tablets (10 mg total) by mouth every 12 (twelve) hours as needed (abdominal pain). 90 tablet 1   estradiol (ESTRACE) 1 MG tablet Take 1 tablet (1 mg total) by mouth 2 (two) times daily. 180 tablet  0   ferrous sulfate 325 (65 FE) MG EC tablet Take 325 mg by mouth daily with breakfast.     gabapentin (NEURONTIN) 600 MG tablet TAKE 1 TABLET BY MOUTH IN THE MORNING TAKE 1 TABLET IN THE EVENING AND TAKE 2 TABLETS AT BEDTIME (Patient taking differently: Take 600-1,200 mg by mouth See admin instructions. TAKE  600 MG BY MOUTH IN THE MORNING AND AT BEDTIME, 1200 MG AT LUNCHTIME) 120 tablet 3   lisinopril (ZESTRIL) 20 MG tablet Take 1 tablet (20 mg total) by mouth daily. 90 tablet 0    lubiprostone (AMITIZA) 8 MCG capsule Take 1 capsule (8 mcg total) by mouth 2 (two) times daily with a meal. 180 capsule 1   metoprolol succinate (TOPROL-XL) 25 MG 24 hr tablet TAKE 1 TABLET BY MOUTH EVERY DAY 90 tablet 0   mirabegron ER (MYRBETRIQ) 25 MG TB24 tablet Take 1 tablet (25 mg total) by mouth daily. 28 tablet 0   pantoprazole (PROTONIX) 40 MG tablet Take 1 tablet (40 mg total) by mouth daily. 90 tablet 3   sodium chloride (MURO 128) 2 % ophthalmic solution Place 1 drop into both eyes in the morning, at noon, and at bedtime.     traZODone (DESYREL) 100 MG tablet Take 50 mg by mouth at bedtime.     No current facility-administered medications for this visit.    Allergies  Allergen Reactions   Other Hives    Needs benadryl when given narcotics    Social History   Socioeconomic History   Marital status: Married    Spouse name: Not on file   Number of children: 4   Years of education: Not on file   Highest education level: Not on file  Occupational History   Occupation: Customer service manager: [employer]  Tobacco Use   Smoking status: Never   Smokeless tobacco: Never  Vaping Use   Vaping Use: Never used  Substance and Sexual Activity   Alcohol use: Yes    Comment: rare-occ. monthly   Drug use: No   Sexual activity: Yes    Birth control/protection: Surgical  Other Topics Concern   Not on file  Social History Narrative   [name] lives with her husband some of her children & 1 grand-son. She works FT as Audiological scientist, also teaches.   Social Determinants of Health   Financial Resource Strain: Not on file  Food Insecurity: Not on file  Transportation Needs: Not on file  Physical Activity: Not on file  Stress: Not on file  Social Connections: Not on file  Intimate Partner Violence: Not on file    Family History  Problem Relation Age of Onset   Hypertension Mother    Cancer Mother 72       breast   Diabetes Mother    Hyperlipidemia Mother    Thyroid  disease Mother    Breast cancer Mother 58   Depression Mother    Hypertension Father    Diabetes Father    Heart disease Father    Stroke Father    Depression Father    Bipolar disorder Father    Cancer Maternal Grandmother        Breast   Hyperlipidemia Maternal Grandmother    Heart disease Maternal Grandmother    Stroke Maternal Grandmother    Hypertension Maternal Grandmother    Breast cancer Maternal Grandmother 26   Hyperlipidemia Maternal Grandfather    Hypertension Maternal Grandfather    Cancer Paternal Grandmother  Colon   Hyperlipidemia Paternal Grandmother    Heart disease Paternal Grandmother    Hyperlipidemia Paternal Grandfather    Hypertension Paternal Grandfather    Breast cancer Paternal Aunt     Review of Systems:  As stated in the HPI and otherwise negative.   LMP  (LMP Unknown) Comment: AUB  Physical Examination: General: Well developed, well nourished, NAD  HEENT: OP clear, mucus membranes moist  SKIN: warm, dry. No rashes. Neuro: No focal deficits  Musculoskeletal: Muscle strength 5/5 all ext  Psychiatric: Mood and affect normal  Neck: No JVD, no carotid bruits, no thyromegaly, no lymphadenopathy.  Lungs:Clear bilaterally, no wheezes, rhonci, crackles Cardiovascular: Regular rate and rhythm. No murmurs, gallops or rubs. Abdomen:Soft. Bowel sounds present. Non-tender.  Extremities: No lower extremity edema. Pulses are 2 + in the bilateral DP/PT.  EKG:  EKG {ACTION; IS/IS [id number]} ordered today. The ekg ordered today demonstrates ***  Recent Labs: 02/07/2022: Hemoglobin 13.6; Platelets 179 02/19/2022: ALT 37; BUN 10; Creat 0.93; Potassium 4.4; Sodium 139   Lipid Panel    Component Value Date/Time   CHOL 172 11/09/2019 0910   CHOL 190 01/05/2013 1041   TRIG 101 11/09/2019 0910   TRIG 110 01/05/2013 1041   HDL 60 11/09/2019 0910   HDL 61 01/05/2013 1041   CHOLHDL 2.9 11/09/2019 0910   VLDL 27 05/09/2017 0835   LDLCALC 93  11/09/2019 0910   LDLCALC 107 (H) 01/05/2013 1041     Wt Readings from Last 3 Encounters:  02/14/22 147 lb (66.7 kg)  02/13/22 147 lb (66.7 kg)  02/07/22 147 lb (66.7 kg)     Other studies Reviewed: Additional studies/ records that were reviewed today include: ***. Review of the above records demonstrates: ***   Assessment and Plan:   1.   Current medicines are reviewed at length with the patient today.  The patient {ACTIONS; HAS/DOES NOT HAVE:19233} concerns regarding medicines.  The following changes have been made:  {PLAN; NO CHANGE:13088:s}  Labs/ tests ordered today include: *** No orders of the defined types were placed in this encounter.    Disposition:   F/U with *** in {gen number [id number]} {TIME; UNITS DAY/WEEK/MONTH:19136}   Signed, Lauree Chandler, MD 03/21/2022 1:45 PM    [facility] [address], [place], [place]  [address] Phone: [phone]; Fax: [phone]

## 2022-03-22 ENCOUNTER — Ambulatory Visit: Payer: BC Managed Care – PPO | Admitting: Cardiovascular Disease

## 2022-04-25 ENCOUNTER — Ambulatory Visit (INDEPENDENT_AMBULATORY_CARE_PROVIDER_SITE_OTHER): Payer: BC Managed Care – PPO | Admitting: Gastroenterology

## 2022-04-25 ENCOUNTER — Encounter (INDEPENDENT_AMBULATORY_CARE_PROVIDER_SITE_OTHER): Payer: Self-pay | Admitting: Gastroenterology

## 2022-04-25 VITALS — BP 118/73 | HR 66 | Temp 98.0°F | Ht 65.0 in | Wt 145.4 lb

## 2022-04-25 DIAGNOSIS — K719 Toxic liver disease, unspecified: Secondary | ICD-10-CM | POA: Insufficient documentation

## 2022-04-25 DIAGNOSIS — R112 Nausea with vomiting, unspecified: Secondary | ICD-10-CM

## 2022-04-25 DIAGNOSIS — K589 Irritable bowel syndrome without diarrhea: Secondary | ICD-10-CM | POA: Insufficient documentation

## 2022-04-25 DIAGNOSIS — K219 Gastro-esophageal reflux disease without esophagitis: Secondary | ICD-10-CM

## 2022-04-25 DIAGNOSIS — K581 Irritable bowel syndrome with constipation: Secondary | ICD-10-CM

## 2022-04-25 MED ORDER — ONDANSETRON HCL 4 MG PO TABS: 4.0000 mg | ORAL_TABLET | Freq: Three times a day (TID) | ORAL | 1 refills | Status: DC | PRN

## 2022-04-25 NOTE — Progress Notes (Signed)
Theresa [name], M.D. Gastroenterology & Hepatology [facility] [address] [place], [place] [address]  Primary Care Physician: Tilda Burrow, NP [address] [place] [address]  I will communicate my assessment and recommendations to the referring MD via EMR.  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 female with past medical history of IBS-C, bipolar disorder, anxiety, attention deficit disorder, depression, obesity s/p gastric bypass, GERD complicated by short segment Barrett's esophagus, hyperlipidemia, hypertension and sleep apnea, coming to the clinic for follow up of GERD, nausea, abdominal pain and elevated liver function tests.  The patient was last seen on 12/24/2021. At that time, the patient was scheduled for an EGD which were performed on 02/13/2022 with findings described below.  She was started on Amitiza 8 mcg twice a day and had her Protonix increased to 40 mg every day.  Patient underwent further evaluation of her elevated LFTs which included a liver biopsy as she had positive ANA but with normal IgG and ASMA.  Liver biopsy showed changes consistent with subacute hepatitis but was not specific for a specific disorder.  Notably, her aminotransferases almost normalized on 02/19/2022 with an ALT of 37 AST of 34.  It was considered that her abnormalities were related to drug-induced liver injury versus a transient viral infection, which were improving with conservative management.  Patient reports that she has found that when she eats dairy she has presented more nausea or vomiting than usual, as well as diarrhea and abdominal pain.She may still have some episodes of pain after having any food intake. She has tried taking Lactaid which she  believes helps some relief in her symptoms. She may also have some nausea even if she avoids dairy, which helps relieving her symptoms. She is concerned as she has severe pain  when she eats, but she has felt improvement with the use of dicyclomine.   Reports that while she is taking pantoprazole 40 mg every day she is having some breakthrough episodes of heartburn during the day but not as bad as in the past. She is able to sleep without regurgitation.   She is having 3 Bms every week while taking Amitiza 8 mcg BID.  The patient denies having any fever, chills, hematochezia, melena, hematemesis, diarrhea, jaundice, pruritus or weight loss.  Last EGD: 02/13/2022 - 1 cm hiatal hernia. - Esophageal mucosal changes suspicious for short-segment Barrett's esophagus. Biopsied. - Gastric bypass with intact staple line. Gastrojejunal anastomosis characterized by healthy appearing mucosa. Biopsied. - Normal examined jejunum.  Last Colonoscopy: 07/04/2021, 5 mm polyp in transverse colon removed with a cold snare (consistent with SSL) Repeat colonoscopy in 3 years due to quality of the prep  Past Medical History: Past Medical History:  Diagnosis Date   Anemia    Anxiety    Attention deficit disorder    Bipolar disorder ([place])    currently no meds- agitation noted during PAT assessment   Depression    GERD (gastroesophageal reflux disease)    resolved   H/O transfusion    17 yrs ago postpartum   Hyperlipidemia    resolved   Hypertension    history of HTN prior to gastric bypass   Hypoglycemia    Left breast abscess 01/22/2017   Sleep apnea, obstructive    cpap used auto settings, resolved gastric bypass  Past Surgical History: Past Surgical History:  Procedure Laterality Date   ABDOMINAL HYSTERECTOMY     ABDOMINOPLASTY     AUGMENTATION MAMMAPLASTY Bilateral 2016   BIOPSY  02/13/2022   Procedure: BIOPSY;  Surgeon: Harvel Quale, MD;  [facility];  Service:  Gastroenterology;;   BREAST BIOPSY Left 01/2017   abscess removed   CESAREAN SECTION     COLON SURGERY     COLONOSCOPY WITH PROPOFOL N/A 07/04/2021   Procedure: COLONOSCOPY WITH PROPOFOL;  Surgeon: Harvel Quale, MD;  [facility];  Service: Gastroenterology;  Laterality: N/A;  2:00 pm   ESOPHAGOGASTRODUODENOSCOPY (EGD) WITH PROPOFOL N/A 02/13/2022   Procedure: ESOPHAGOGASTRODUODENOSCOPY (EGD) WITH PROPOFOL;  Surgeon: Harvel Quale, MD;  [facility];  Service: Gastroenterology;  Laterality: N/A;  815   GASTRIC ROUX-EN-Y N/A 12/06/2013   Procedure: LAPAROSCOPIC ROUX-EN-Y GASTRIC BYPASS WITH UPPER ENDOSCOPY;  Surgeon: Gayland Curry, MD;  [facility];  Service: General;  Laterality: N/A;   INCISION AND DRAINAGE ABSCESS Left 01/22/2017   Procedure: INCISION AND DRAINAGE LEFT  BREAST ABSCESS;  Surgeon: Fanny Skates, MD;  Location: [place];  Service: General;  Laterality: Left;   LAPAROTOMY N/A 02/18/2017   Procedure: LAPAROTOMY;  Surgeon: Molli Posey, MD;  Location: [facility];  Service: Gynecology;  Laterality: N/A;   POLYPECTOMY  07/04/2021   Procedure: POLYPECTOMY;  Surgeon: Harvel Quale, MD;  [facility];  Service: Gastroenterology;;   SALPINGOOPHORECTOMY Bilateral 02/18/2017   Procedure: BILATERAL SALPINGO OOPHORECTOMY;  Surgeon: Molli Posey, MD;  Location: [facility];  Service: Gynecology;  Laterality: Bilateral;   TUBAL LIGATION     UNILATERAL SALPINGECTOMY Right 09/05/2015   Procedure: UNILATERAL SALPINGECTOMY;  Surgeon: Molli Posey, MD;  Location: [facility];  Service: Gynecology;  Laterality: Right;   VAGINAL HYSTERECTOMY N/A 09/05/2015   Procedure: HYSTERECTOMY VAGINAL;  Surgeon: Molli Posey, MD;  Location: [facility];  Service: Gynecology;  Laterality: N/A;   WISDOM TOOTH EXTRACTION      Family History: Family History  Problem Relation Age of Onset   Hypertension Mother    Cancer  Mother 78       breast   Diabetes Mother    Hyperlipidemia Mother    Thyroid disease Mother    Breast cancer Mother 66   Depression Mother    Hypertension Father    Diabetes Father    Heart disease Father    Stroke Father    Depression Father    Bipolar disorder Father    Cancer Maternal Grandmother        Breast   Hyperlipidemia Maternal Grandmother    Heart disease Maternal Grandmother    Stroke Maternal Grandmother    Hypertension Maternal Grandmother    Breast cancer Maternal Grandmother 31   Hyperlipidemia Maternal Grandfather    Hypertension Maternal Grandfather    Cancer Paternal Grandmother        Colon   Hyperlipidemia Paternal Grandmother    Heart disease Paternal Grandmother    Hyperlipidemia Paternal Grandfather    Hypertension Paternal Grandfather    Breast cancer Paternal Aunt     Social History: Social History   Tobacco Use  Smoking Status Never  Smokeless Tobacco Never   Social History   Substance and Sexual Activity  Alcohol Use Yes   Comment: rare-occ. monthly   Social History   Substance and Sexual Activity  Drug Use No    Allergies: Allergies  Allergen Reactions   Other  Hives    Needs benadryl when given narcotics    Medications: Current Outpatient Medications  Medication Sig Dispense Refill   ALPRAZolam (XANAX) 0.5 MG tablet Take 0.5 mg by mouth daily as needed for anxiety.     buPROPion (WELLBUTRIN SR) 150 MG 12 hr tablet TAKE 1 TABLET BY MOUTH TWICE A DAY (Patient taking differently: Take 150 mg by mouth daily.) 180 tablet 1   dicyclomine (BENTYL) 20 MG tablet Take 0.5 tablets (10 mg total) by mouth every 12 (twelve) hours as needed (abdominal pain). (Patient taking differently: Take 10 mg by mouth 2 (two) times daily.) 90 tablet 1   estradiol (ESTRACE) 1 MG tablet Take 1 tablet (1 mg total) by mouth 2 (two) times daily. 180 tablet 0   gabapentin (NEURONTIN) 600 MG tablet TAKE 1 TABLET BY MOUTH IN THE MORNING TAKE 1 TABLET IN THE  EVENING AND TAKE 2 TABLETS AT BEDTIME (Patient taking differently: Take 600-1,200 mg by mouth See admin instructions. TAKE  600 MG BY MOUTH IN THE MORNING AND AT BEDTIME, 1200 MG AT LUNCHTIME) 120 tablet 3   lubiprostone (AMITIZA) 8 MCG capsule Take 1 capsule (8 mcg total) by mouth 2 (two) times daily with a meal. 180 capsule 1   metoprolol succinate (TOPROL-XL) 25 MG 24 hr tablet TAKE 1 TABLET BY MOUTH EVERY DAY 90 tablet 0   pantoprazole (PROTONIX) 40 MG tablet Take 1 tablet (40 mg total) by mouth daily. 90 tablet 3   sodium chloride (MURO 128) 2 % ophthalmic solution Place 1 drop into both eyes in the morning, at noon, and at bedtime.     traZODone (DESYREL) 100 MG tablet Take 50 mg by mouth at bedtime.     No current facility-administered medications for this visit.    Review of Systems: GENERAL: negative for malaise, night sweats HEENT: No changes in hearing or vision, no nose bleeds or other nasal problems. NECK: Negative for lumps, goiter, pain and significant neck swelling RESPIRATORY: Negative for cough, wheezing CARDIOVASCULAR: Negative for chest pain, leg swelling, palpitations, orthopnea GI: SEE HPI MUSCULOSKELETAL: Negative for joint pain or swelling, back pain, and muscle pain. SKIN: Negative for lesions, rash PSYCH: Negative for sleep disturbance, mood disorder and recent psychosocial stressors. HEMATOLOGY Negative for prolonged bleeding, bruising easily, and swollen nodes. ENDOCRINE: Negative for cold or heat intolerance, polyuria, polydipsia and goiter. NEURO: negative for tremor, gait imbalance, syncope and seizures. The remainder of the review of systems is noncontributory.   Physical Exam: BP 118/73 (BP Location: Left Arm, Patient Position: Sitting, Cuff Size: Small)   Pulse 66   Temp 98 F (36.7 C) (Oral)   Ht 5' 5"  (1.651 m)   Wt 145 lb 6.4 oz (66 kg)   LMP  (LMP Unknown) Comment: AUB  BMI 24.20 kg/m  GENERAL: The patient is AO x3, in no acute  distress. HEENT: Head is normocephalic and atraumatic. EOMI are intact. Mouth is well hydrated and without lesions. NECK: Supple. No masses LUNGS: Clear to auscultation. No presence of rhonchi/wheezing/rales. Adequate chest expansion HEART: RRR, normal s1 and s2. ABDOMEN: Soft, nontender, no guarding, no peritoneal signs, and nondistended. BS +. No masses. EXTREMITIES: Without any cyanosis, clubbing, rash, lesions or edema. NEUROLOGIC: AOx3, no focal motor deficit. SKIN: no jaundice, no rashes  Imaging/Labs: as above  I personally reviewed and interpreted the available labs, imaging and endoscopic files.  Impression and Plan: NAARAH BORGERDING is a 51 y.o. female with past medical history of IBS-C, bipolar disorder, anxiety,  attention deficit disorder, depression, obesity s/p gastric bypass, GERD complicated by short segment Barrett's esophagus, hyperlipidemia, hypertension and sleep apnea, coming to the clinic for follow up of GERD, nausea, abdominal pain and elevated liver function tests.  The patient has presented improvement of her symptoms of reflux while taking pantoprazole 40 mg every day but still having some breakthrough episodes of heartburn.  I advised her to take some over-the-counter Pepcid to improve her symptoms if she presents these episodes, as they are not present on a daily basis.  She will need a surveillance EGD 5 years from her last procedure.  In terms of her IBS-C, this has been well controlled with Amitiza twice a day, she should continue on the same dose that she is taking by now.  She has had an extensive investigation of her abdominal complaints which have been unremarkable for an etiology, I considered these symptoms are related to her IBS.  She can continue taking Bentyl as needed for abdominal pain but also can continue taking Zofran as needed for nausea.  Finally, her liver enzymes down trended.  This is reassuring and likely corresponds to an episode of  drug-induced liver injury versus transient viral infection.  - Continue pantoprazole 40 mg qday - Can take Pepcid AC for breakthrough episodes of reflux - Continue Amitiza 8 mcg twice a day - Start Zofran 4 mg q8h as needed for nausea - Start Bentyl 1 tablet q8-12h as needed for abdominal pain  All questions were answered.      Harvel Quale, MD Gastroenterology and Hepatology [facility]

## 2022-04-25 NOTE — Patient Instructions (Signed)
Continue pantoprazole 40 mg qday Can take Pepcid AC for breakthrough episodes of reflux Continue Amitiza 8 mcg twice a day Start Zofran 4 mg q8h as needed for nausea Start Bentyl 1 tablet q8-12h as needed for abdominal pain

## 2022-05-08 ENCOUNTER — Ambulatory Visit: Payer: BC Managed Care – PPO | Admitting: Urology

## 2022-05-08 NOTE — Progress Notes (Deleted)
Assessment: 1. Pelvic pain in female      Plan: I discussed the findings of normal cystoscopy with the patient today. We again discussed that her pelvic pain does not seem to be urologic in nature. Recommend consideration for physical therapy for management of her chronic pelvic pain. Refer to [facility] for pelvic floor PT evaluation Return to office in 2 months  Chief Complaint:  No chief complaint on file.   History of Present Illness:  Theresa [name] is a 51 y.o. year old female who is seen for further evaluation of chronic pelvic pain.  She has had symptoms for 4 years.  She has pain in the left lower quadrant which radiates across her lower abdomen and suprapubic region.  The pain is chronic in nature and is generally at a 7/10 in intensity.  She has increased pain after voiding to a 10/10.  The pain makes it difficult for her to stand after voiding.  She does not have any frequency, nocturia, dysuria, incontinence, gross hematuria, or dyspareunia.   CT from 3/23 showed normal kidneys bilaterally without obstruction or stones and a unremarkable bladder.  She has had a hysterectomy and subsequent bilateral oophorectomy. She was given a trial of Myrbetriq in June 2023.  At her visit in June 2023, her pelvic pain remained unchanged.  Her pelvic pain was fairly constant and not associated with a full bladder.  She reported some urgency.  No dysuria or gross hematuria.  She did not see any change in her symptoms with Myrbetriq. Cystoscopy showed a normal urethra and bladder. She was referred for pelvic floor physical therapy.  Portions of the above documentation were copied from a prior visit for review purposes only.   Past Medical History:  Past Medical History:  Diagnosis Date   Anemia    Anxiety    Attention deficit disorder    Bipolar disorder ([place])    currently no meds- agitation noted during PAT assessment   Depression    GERD (gastroesophageal reflux  disease)    resolved   H/O transfusion    17 yrs ago postpartum   Hyperlipidemia    resolved   Hypertension    history of HTN prior to gastric bypass   Hypoglycemia    Left breast abscess 01/22/2017   Sleep apnea, obstructive    cpap used auto settings, resolved gastric bypass    Past Surgical History:  Past Surgical History:  Procedure Laterality Date   ABDOMINAL HYSTERECTOMY     ABDOMINOPLASTY     AUGMENTATION MAMMAPLASTY Bilateral 2016   BIOPSY  02/13/2022   Procedure: BIOPSY;  Surgeon: Harvel Quale, MD;  [facility];  Service: Gastroenterology;;   BREAST BIOPSY Left 01/2017   abscess removed   CESAREAN SECTION     COLON SURGERY     COLONOSCOPY WITH PROPOFOL N/A 07/04/2021   Procedure: COLONOSCOPY WITH PROPOFOL;  Surgeon: Harvel Quale, MD;  [facility];  Service: Gastroenterology;  Laterality: N/A;  2:00 pm   ESOPHAGOGASTRODUODENOSCOPY (EGD) WITH PROPOFOL N/A 02/13/2022   Procedure: ESOPHAGOGASTRODUODENOSCOPY (EGD) WITH PROPOFOL;  Surgeon: Harvel Quale, MD;  [facility];  Service: Gastroenterology;  Laterality: N/A;  815   GASTRIC ROUX-EN-Y N/A 12/06/2013   Procedure: LAPAROSCOPIC ROUX-EN-Y GASTRIC BYPASS WITH UPPER ENDOSCOPY;  Surgeon: Gayland Curry, MD;  [facility];  Service: General;  Laterality: N/A;   INCISION AND DRAINAGE ABSCESS Left 01/22/2017   Procedure: INCISION AND DRAINAGE LEFT  BREAST ABSCESS;  Surgeon: Fanny Skates, MD;  Location: [place];  Service: General;  Laterality: Left;   LAPAROTOMY N/A 02/18/2017   Procedure: LAPAROTOMY;  Surgeon: Molli Posey, MD;  Location: [facility];  Service: Gynecology;  Laterality: N/A;   POLYPECTOMY  07/04/2021   Procedure: POLYPECTOMY;  Surgeon: Harvel Quale, MD;  [facility];  Service: Gastroenterology;;   SALPINGOOPHORECTOMY Bilateral 02/18/2017   Procedure: BILATERAL SALPINGO OOPHORECTOMY;  Surgeon:  Molli Posey, MD;  Location: [facility];  Service: Gynecology;  Laterality: Bilateral;   TUBAL LIGATION     UNILATERAL SALPINGECTOMY Right 09/05/2015   Procedure: UNILATERAL SALPINGECTOMY;  Surgeon: Molli Posey, MD;  Location: [facility];  Service: Gynecology;  Laterality: Right;   VAGINAL HYSTERECTOMY N/A 09/05/2015   Procedure: HYSTERECTOMY VAGINAL;  Surgeon: Molli Posey, MD;  Location: [facility];  Service: Gynecology;  Laterality: N/A;   WISDOM TOOTH EXTRACTION      Allergies:  Allergies  Allergen Reactions   Other Hives    Needs benadryl when given narcotics    Family History:  Family History  Problem Relation Age of Onset   Hypertension Mother    Cancer Mother 41       breast   Diabetes Mother    Hyperlipidemia Mother    Thyroid disease Mother    Breast cancer Mother 12   Depression Mother    Hypertension Father    Diabetes Father    Heart disease Father    Stroke Father    Depression Father    Bipolar disorder Father    Cancer Maternal Grandmother        Breast   Hyperlipidemia Maternal Grandmother    Heart disease Maternal Grandmother    Stroke Maternal Grandmother    Hypertension Maternal Grandmother    Breast cancer Maternal Grandmother 62   Hyperlipidemia Maternal Grandfather    Hypertension Maternal Grandfather    Cancer Paternal Grandmother        Colon   Hyperlipidemia Paternal Grandmother    Heart disease Paternal Grandmother    Hyperlipidemia Paternal Grandfather    Hypertension Paternal Grandfather    Breast cancer Paternal Aunt     Social History:  Social History   Tobacco Use   Smoking status: Never   Smokeless tobacco: Never  Vaping Use   Vaping Use: Never used  Substance Use Topics   Alcohol use: Yes    Comment: rare-occ. monthly   Drug use: No    ROS: Constitutional:  Negative for fever, chills, weight loss CV: Negative for chest pain, previous MI, hypertension Respiratory:  Negative for shortness of breath, wheezing, sleep  apnea, frequent cough GI:  Negative for nausea, vomiting, bloody stool, GERD  Physical exam: LMP  (LMP Unknown) Comment: AUB GENERAL APPEARANCE:  Well appearing, well developed, well nourished, NAD HEENT:  Atraumatic, normocephalic, oropharynx clear NECK:  Supple without lymphadenopathy or thyromegaly ABDOMEN:  Soft, non-tender, no masses EXTREMITIES:  Moves all extremities well, without clubbing, cyanosis, or edema NEUROLOGIC:  Alert and oriented x 3, normal gait, CN II-XII grossly intact MENTAL STATUS:  appropriate BACK:  Non-tender to palpation, No CVAT SKIN:  Warm, dry, and intact  Results:

## 2022-07-07 ENCOUNTER — Other Ambulatory Visit (INDEPENDENT_AMBULATORY_CARE_PROVIDER_SITE_OTHER): Payer: Self-pay | Admitting: Gastroenterology

## 2022-07-07 DIAGNOSIS — K5904 Chronic idiopathic constipation: Secondary | ICD-10-CM

## 2022-07-08 NOTE — Telephone Encounter (Signed)
Last seen 04/25/22

## 2022-07-22 ENCOUNTER — Encounter (INDEPENDENT_AMBULATORY_CARE_PROVIDER_SITE_OTHER): Payer: Self-pay | Admitting: Gastroenterology

## 2022-08-11 ENCOUNTER — Other Ambulatory Visit (INDEPENDENT_AMBULATORY_CARE_PROVIDER_SITE_OTHER): Payer: Self-pay | Admitting: Gastroenterology

## 2022-08-11 DIAGNOSIS — R109 Unspecified abdominal pain: Secondary | ICD-10-CM

## 2022-08-12 NOTE — Telephone Encounter (Signed)
Last seen 04/25/22

## 2022-11-18 ENCOUNTER — Ambulatory Visit: Payer: BC Managed Care – PPO | Admitting: Orthopedic Surgery

## 2022-11-18 ENCOUNTER — Encounter: Payer: Self-pay | Admitting: Orthopedic Surgery

## 2022-11-18 ENCOUNTER — Ambulatory Visit (INDEPENDENT_AMBULATORY_CARE_PROVIDER_SITE_OTHER): Payer: BC Managed Care – PPO

## 2022-11-18 VITALS — BP 147/95 | HR 63 | Ht 64.0 in | Wt 143.0 lb

## 2022-11-18 DIAGNOSIS — M47816 Spondylosis without myelopathy or radiculopathy, lumbar region: Secondary | ICD-10-CM

## 2022-11-18 DIAGNOSIS — M47812 Spondylosis without myelopathy or radiculopathy, cervical region: Secondary | ICD-10-CM

## 2022-11-18 DIAGNOSIS — M542 Cervicalgia: Secondary | ICD-10-CM

## 2022-11-18 DIAGNOSIS — M545 Low back pain, unspecified: Secondary | ICD-10-CM

## 2022-11-18 MED ORDER — METHOCARBAMOL 500 MG PO TABS: 500.0000 mg | ORAL_TABLET | Freq: Three times a day (TID) | ORAL | 1 refills | Status: AC

## 2022-11-18 MED ORDER — IBUPROFEN 800 MG PO TABS: 800.0000 mg | ORAL_TABLET | Freq: Three times a day (TID) | ORAL | 1 refills | Status: DC | PRN

## 2022-11-18 NOTE — Patient Instructions (Addendum)
Physical therapy has been ordered for you in [place] if you do not hear from them in one week call us to let us know
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
## 2023-02-17 ENCOUNTER — Other Ambulatory Visit (INDEPENDENT_AMBULATORY_CARE_PROVIDER_SITE_OTHER): Payer: Self-pay | Admitting: Gastroenterology

## 2023-02-17 DIAGNOSIS — R109 Unspecified abdominal pain: Secondary | ICD-10-CM

## 2023-04-21 ENCOUNTER — Ambulatory Visit (INDEPENDENT_AMBULATORY_CARE_PROVIDER_SITE_OTHER): Payer: BC Managed Care – PPO | Admitting: Gastroenterology

## 2023-04-21 ENCOUNTER — Encounter (INDEPENDENT_AMBULATORY_CARE_PROVIDER_SITE_OTHER): Payer: Self-pay | Admitting: Gastroenterology

## 2023-04-21 VITALS — BP 121/79 | HR 63 | Temp 97.6°F | Ht 64.0 in | Wt 164.9 lb

## 2023-04-21 DIAGNOSIS — K219 Gastro-esophageal reflux disease without esophagitis: Secondary | ICD-10-CM | POA: Diagnosis not present

## 2023-04-21 DIAGNOSIS — K581 Irritable bowel syndrome with constipation: Secondary | ICD-10-CM

## 2023-04-21 DIAGNOSIS — R112 Nausea with vomiting, unspecified: Secondary | ICD-10-CM | POA: Diagnosis not present

## 2023-04-21 DIAGNOSIS — R12 Heartburn: Secondary | ICD-10-CM

## 2023-04-21 MED ORDER — DEXLANSOPRAZOLE 30 MG PO CPDR: 30.0000 mg | DELAYED_RELEASE_CAPSULE | Freq: Every day | ORAL | 3 refills | Status: DC

## 2023-04-21 MED ORDER — ONDANSETRON HCL 4 MG PO TABS: 4.0000 mg | ORAL_TABLET | Freq: Three times a day (TID) | ORAL | 1 refills | Status: AC | PRN

## 2023-04-21 MED ORDER — LUBIPROSTONE 24 MCG PO CAPS: 24.0000 ug | ORAL_CAPSULE | Freq: Two times a day (BID) | ORAL | 3 refills | Status: AC

## 2023-04-21 NOTE — Progress Notes (Unsigned)
Katrinka Blazing, M.D. Gastroenterology & Hepatology [facility] Gastroenterology [address] [place], [place] [address]  Primary Care Physician: Karl Bales, NP [address] [place] [address]  I will communicate my assessment and recommendations to the referring MD via EMR.  Problems: GERD Barrett's esophagus without dysplasia IBS-C Elevated LFTs, possibly related to drug-induced liver injury versus transient viral infection  History of Present Illness: Theresa [name] is a 52 y.o.  female with past medical history of IBS-C, bipolar disorder, anxiety, attention deficit disorder, depression, obesity s/p gastric bypass, GERD complicated by short segment Barrett's esophagus, hyperlipidemia, hypertension and sleep apnea, coming to the clinic for follow up of GERD, nausea, abdominal pain and elevated liver function tests.   The patient was last seen on 04/25/2022. At that time, the patient was advised to continue pantoprazole 40 mg every day and take Pepcid as needed, as well as taking Amitiza 8 mcg twice a day.  She was given a prescription for Zofran as needed for nausea and Bentyl as needed for abdominal pain.  Patient reports that even if she is eating food that is not a common trigger of symptoms, she will have heartburn and epigastric pain. She reports that she is taking Protonix 40 mg and waits 30 minutes to have breakfast. However, she is having frequent burning in her chest and abdomen. She takes Tums, Pepto and Pepcid frequently as her symptoms are not controlled with this.  Patient reports having nausea regularly - takes Zofran two times a day on average. She may use some of her daughter's Phenergan when she runs out of the medication.  She states that she has is having a BM every 3-4 days while taking Amitiza 8 mcg BID. She is passing significant amount of gas/flatulence regularly.  The patient denies having any nausea, vomiting,  fever, chills, hematochezia, melena, hematemesis, abdominal distention, abdominal pain, diarrhea, jaundice, pruritus or weight loss.  Last EGD: 02/13/2022 - 1 cm hiatal hernia. - Esophageal mucosal changes suspicious for short-segment Barrett's esophagus. Biopsied. - Gastric bypass with intact staple line. Gastrojejunal anastomosis characterized by healthy appearing mucosa. Biopsied. - Normal examined jejunum.   Last Colonoscopy: 07/04/2021, 5 mm polyp in transverse colon removed with a cold snare (consistent with SSL) Repeat colonoscopy in 3 years due to quality of the prep  Past Medical History: Past Medical History:  Diagnosis Date   Anemia    Anxiety    Attention deficit disorder    Bipolar disorder (HCC)    currently no meds- agitation noted during PAT assessment   Depression    GERD (gastroesophageal reflux disease)    resolved   H/O transfusion    17 yrs ago postpartum   Hyperlipidemia    resolved   Hypertension    history of HTN prior to gastric bypass   Hypoglycemia    Left breast abscess 01/22/2017   Sleep apnea, obstructive    cpap used auto settings, resolved gastric bypass    Past Surgical History: Past Surgical History:  Procedure Laterality Date   ABDOMINAL HYSTERECTOMY     ABDOMINOPLASTY     AUGMENTATION MAMMAPLASTY Bilateral 2016   BIOPSY  02/13/2022   Procedure: BIOPSY;  Surgeon: Dolores Frame, MD;  [facility];  Service: Gastroenterology;;   BREAST BIOPSY Left 01/2017   abscess removed   CESAREAN SECTION     COLON SURGERY     COLONOSCOPY WITH PROPOFOL N/A 07/04/2021   Procedure: COLONOSCOPY WITH PROPOFOL;  Surgeon: Marguerita Merles, Reuel Boom, MD;  Location: [facility];  Service: Gastroenterology;  Laterality: N/A;  2:00 pm   ESOPHAGOGASTRODUODENOSCOPY (EGD) WITH PROPOFOL N/A 02/13/2022   Procedure: ESOPHAGOGASTRODUODENOSCOPY (EGD) WITH PROPOFOL;  Surgeon: Dolores Frame, MD;  [facility]: [facility];  Service:  Gastroenterology;  Laterality: N/A;  815   GASTRIC ROUX-EN-Y N/A 12/06/2013   Procedure: LAPAROSCOPIC ROUX-EN-Y GASTRIC BYPASS WITH UPPER ENDOSCOPY;  Surgeon: Atilano Ina, MD;  Location: [facility];  Service: General;  Laterality: N/A;   INCISION AND DRAINAGE ABSCESS Left 01/22/2017   Procedure: INCISION AND DRAINAGE LEFT  BREAST ABSCESS;  Surgeon: Claud Kelp, MD;  Location: [facility];  Service: General;  Laterality: Left;   LAPAROTOMY N/A 02/18/2017   Procedure: LAPAROTOMY;  Surgeon: Richarda Overlie, MD;  Location: [facility];  Service: Gynecology;  Laterality: N/A;   POLYPECTOMY  07/04/2021   Procedure: POLYPECTOMY;  Surgeon: Dolores Frame, MD;  [facility];  Service: Gastroenterology;;   SALPINGOOPHORECTOMY Bilateral 02/18/2017   Procedure: BILATERAL SALPINGO OOPHORECTOMY;  Surgeon: Richarda Overlie, MD;  Location: [facility];  Service: Gynecology;  Laterality: Bilateral;   TUBAL LIGATION     UNILATERAL SALPINGECTOMY Right 09/05/2015   Procedure: UNILATERAL SALPINGECTOMY;  Surgeon: Richarda Overlie, MD;  Location: [facility];  Service: Gynecology;  Laterality: Right;   VAGINAL HYSTERECTOMY N/A 09/05/2015   Procedure: HYSTERECTOMY VAGINAL;  Surgeon: Richarda Overlie, MD;  Location: [facility];  Service: Gynecology;  Laterality: N/A;   WISDOM TOOTH EXTRACTION      Family History: Family History  Problem Relation Age of Onset   Hypertension Mother    Cancer Mother 63       breast   Diabetes Mother    Hyperlipidemia Mother    Thyroid disease Mother    Breast cancer Mother 60   Depression Mother    Hypertension Father    Diabetes Father    Heart disease Father    Stroke Father    Depression Father    Bipolar disorder Father    Cancer Maternal Grandmother        Breast   Hyperlipidemia Maternal Grandmother    Heart disease Maternal Grandmother    Stroke Maternal Grandmother    Hypertension Maternal Grandmother    Breast cancer Maternal Grandmother 37    Hyperlipidemia Maternal Grandfather    Hypertension Maternal Grandfather    Cancer Paternal Grandmother        Colon   Hyperlipidemia Paternal Grandmother    Heart disease Paternal Grandmother    Hyperlipidemia Paternal Grandfather    Hypertension Paternal Grandfather    Breast cancer Paternal Aunt     Social History: Social History   Tobacco Use  Smoking Status Never  Smokeless Tobacco Never   Social History   Substance and Sexual Activity  Alcohol Use Yes   Comment: rare-occ. monthly   Social History   Substance and Sexual Activity  Drug Use No    Allergies: Allergies  Allergen Reactions   Other Hives    Needs benadryl when given narcotics    Medications: Current Outpatient Medications  Medication Sig Dispense Refill   ALPRAZolam (XANAX) 0.5 MG tablet Take 0.5 mg by mouth daily as needed for anxiety.     buPROPion (WELLBUTRIN SR) 150 MG 12 hr tablet TAKE 1 TABLET BY MOUTH TWICE A DAY (Patient taking differently: Take 150 mg by mouth daily.) 180 tablet 1   dicyclomine (BENTYL) 20 MG tablet Take 1/2 tablet (10 mg total) by mouth every  12 (twelve) hours as needed (abdominal pain). 90 tablet 1   estradiol (ESTRACE) 1 MG tablet Take 1 tablet (1 mg total) by mouth 2 (two) times daily. 180 tablet 0   gabapentin (NEURONTIN) 600 MG tablet TAKE 1 TABLET BY MOUTH IN THE MORNING TAKE 1 TABLET IN THE EVENING AND TAKE 2 TABLETS AT BEDTIME (Patient taking differently: Take 600-1,200 mg by mouth See admin instructions. TAKE  600 MG BY MOUTH IN THE MORNING AND AT BEDTIME, 1200 MG AT LUNCHTIME) 120 tablet 3   ibuprofen (ADVIL) 800 MG tablet Take 1 tablet (800 mg total) by mouth every 8 (eight) hours as needed. 90 tablet 1   lisinopril (ZESTRIL) 20 MG tablet Take 20 mg by mouth daily.     lubiprostone (AMITIZA) 8 MCG capsule TAKE (1) CAPSULE BY MOUTH TWICE DAILY WITH FOOD. 180 capsule 1   methocarbamol (ROBAXIN) 500 MG tablet Take 1 tablet (500 mg total) by mouth 3 (three) times  daily. 60 tablet 1   metoprolol succinate (TOPROL-XL) 25 MG 24 hr tablet TAKE 1 TABLET BY MOUTH EVERY DAY 90 tablet 0   ondansetron (ZOFRAN) 4 MG tablet Take 1 tablet (4 mg total) by mouth every 8 (eight) hours as needed for nausea or vomiting. 30 tablet 1   pantoprazole (PROTONIX) 40 MG tablet Take 1 tablet (40 mg total) by mouth daily. 90 tablet 3   sertraline (ZOLOFT) 50 MG tablet Take 50 mg by mouth daily.     sodium chloride (MURO 128) 2 % ophthalmic solution Place 1 drop into both eyes in the morning, at noon, and at bedtime.     topiramate (TOPAMAX) 25 MG tablet Take by mouth.     traZODone (DESYREL) 100 MG tablet Take 50 mg by mouth at bedtime.     No current facility-administered medications for this visit.    Review of Systems: GENERAL: negative for malaise, night sweats HEENT: No changes in hearing or vision, no nose bleeds or other nasal problems. NECK: Negative for lumps, goiter, pain and significant neck swelling RESPIRATORY: Negative for cough, wheezing CARDIOVASCULAR: Negative for chest pain, leg swelling, palpitations, orthopnea GI: SEE HPI MUSCULOSKELETAL: Negative for joint pain or swelling, back pain, and muscle pain. SKIN: Negative for lesions, rash PSYCH: Negative for sleep disturbance, mood disorder and recent psychosocial stressors. HEMATOLOGY Negative for prolonged bleeding, bruising easily, and swollen nodes. ENDOCRINE: Negative for cold or heat intolerance, polyuria, polydipsia and goiter. NEURO: negative for tremor, gait imbalance, syncope and seizures. The remainder of the review of systems is noncontributory.   Physical Exam: BP 121/79 (BP Location: Left Arm, Patient Position: Sitting, Cuff Size: Normal)   Pulse 63   Temp 97.6 F (36.4 C) (Oral)   Ht 5\' 4"  (1.626 m)   Wt 164 lb 14.4 oz (74.8 kg)   LMP  (LMP Unknown) Comment: AUB  BMI 28.31 kg/m  GENERAL: The patient is AO x3, in no acute distress. HEENT: Head is normocephalic and atraumatic. EOMI  are intact. Mouth is well hydrated and without lesions. NECK: Supple. No masses LUNGS: Clear to auscultation. No presence of rhonchi/wheezing/rales. Adequate chest expansion HEART: RRR, normal s1 and s2. ABDOMEN: Soft, nontender, no guarding, no peritoneal signs, and nondistended. BS +. No masses. EXTREMITIES: Without any cyanosis, clubbing, rash, lesions or edema. NEUROLOGIC: AOx3, no focal motor deficit. SKIN: no jaundice, no rashes  Imaging/Labs: as above  I personally reviewed and interpreted the available labs, imaging and endoscopic files.  Impression and Plan: Theresa [name] is a  52 y.o. female coming for follow up of ***   All questions were answered.      Katrinka Blazing, MD Gastroenterology and Hepatology [facility] Gastroenterology

## 2023-04-21 NOTE — Patient Instructions (Addendum)
Stop pantoprazole and famotidine Start Dexilant 30 mg qday Continue with Zofran as needed for nausea Increase Amitiza to 24 mcg twice a day

## 2023-04-25 ENCOUNTER — Telehealth (INDEPENDENT_AMBULATORY_CARE_PROVIDER_SITE_OTHER): Payer: Self-pay

## 2023-04-25 ENCOUNTER — Other Ambulatory Visit (INDEPENDENT_AMBULATORY_CARE_PROVIDER_SITE_OTHER): Payer: Self-pay | Admitting: Gastroenterology

## 2023-04-25 ENCOUNTER — Encounter (INDEPENDENT_AMBULATORY_CARE_PROVIDER_SITE_OTHER): Payer: Self-pay

## 2023-04-25 DIAGNOSIS — K219 Gastro-esophageal reflux disease without esophagitis: Secondary | ICD-10-CM

## 2023-04-25 DIAGNOSIS — K2270 Barrett's esophagus without dysplasia: Secondary | ICD-10-CM

## 2023-04-25 MED ORDER — ESOMEPRAZOLE MAGNESIUM 40 MG PO CPDR: 40.0000 mg | DELAYED_RELEASE_CAPSULE | Freq: Two times a day (BID) | ORAL | 11 refills | Status: AC

## 2023-04-25 NOTE — Telephone Encounter (Signed)
Date: 04/25/2023 Dolores Frame [address] [place], [place] [address] Plan Member Name: Theresa [name] Plan Member ID: [id number] Plan Name: [facility] Plan [phone] Non-Grandfathered Prescriber Name: Dolores Frame Prescriber Phone: [phone] Prescriber Fax: [phone] Dear Theresa [name]: [facility] [employer]  received a request from your provider for coverage of Dexlansoprazole 30MG  OR CPDR. The request was denied because: Coverage for this medication is denied for the following reason(s). We reviewed the information we received about your condition and circumstances. We used plan approved criteria when making this decision. The policy states that this medication may be covered when the requested product cannot be switched to a formulary alternative. Based on the policy and the information we received your request was denied. The request was denied because the requested medication can be switched to the formulary alternative. Formulary alternatives are: esomeprazole delayed-rel, lansoprazole delayed-rel capsule, omeprazole delayed-rel, pantoprazole delayed-rel tablet. (Requirement: 3 in a class with 3 or more alternatives, 2 in a class with 2 alternatives, or 1 in a class with only 1 alternative.) Note: Formulary alternatives may require a prior authorization. Your prescriber will be responsible for determining what alternative is appropriate for you. You can ask for a free copy of the actual benefit provision, guideline, protocol or other similar criterion used to make the decision and any other information related to this decision by calling Customer Care at [phone]. For more information regarding your pharmacy benefit, please refer to your Benefit Booklet available on the Plan's website at [email or web address] You may request an appeal by sending a written request to the address below. To be eligible for an appeal, your request must be in  writing and received within 180 days othe date of this letter. Please mail or fax your appeal to: [facility] Department / [address] [address] [place], [place] [address] Fax: [phone]

## 2023-04-25 NOTE — Telephone Encounter (Signed)
I called and left a detailed message stating, Per Dr. Levon Hedger. I sent esomeprazole 40 mg twice a day to her pharmacy. Please let the patient know the insurance will cover if she fails three from the same class. So far she has failed pantoprazole and omeprazole in 2014.  I asked if she has any questions she can reach out to the office.

## 2023-04-25 NOTE — Telephone Encounter (Signed)
I sent esomeprazole 40 mg twice a day to her pharmacy. Please let the patient know the insurance will cover if she fails three from the same class. So far she has failed pantoprazole and omeprazole in 2014. Thanks

## 2023-09-07 ENCOUNTER — Other Ambulatory Visit (INDEPENDENT_AMBULATORY_CARE_PROVIDER_SITE_OTHER): Payer: Self-pay | Admitting: Gastroenterology

## 2023-09-07 DIAGNOSIS — R109 Unspecified abdominal pain: Secondary | ICD-10-CM

## 2023-09-08 NOTE — Telephone Encounter (Signed)
 Last OV 07/22/23

## 2023-10-23 ENCOUNTER — Ambulatory Visit (INDEPENDENT_AMBULATORY_CARE_PROVIDER_SITE_OTHER): Payer: 59 | Admitting: Gastroenterology

## 2023-12-03 ENCOUNTER — Other Ambulatory Visit: Payer: Self-pay

## 2023-12-03 ENCOUNTER — Emergency Department (HOSPITAL_COMMUNITY)
Admission: EM | Admit: 2023-12-03 | Discharge: 2023-12-03 | Disposition: A | Discharge status: Home / Self Care | Attending: Emergency Medicine | Admitting: Emergency Medicine

## 2023-12-03 ENCOUNTER — Emergency Department (HOSPITAL_COMMUNITY)

## 2023-12-03 ENCOUNTER — Encounter (HOSPITAL_COMMUNITY): Payer: Self-pay

## 2023-12-03 DIAGNOSIS — W01198A Fall on same level from slipping, tripping and stumbling with subsequent striking against other object, initial encounter: Secondary | ICD-10-CM | POA: Insufficient documentation

## 2023-12-03 DIAGNOSIS — R112 Nausea with vomiting, unspecified: Secondary | ICD-10-CM | POA: Insufficient documentation

## 2023-12-03 DIAGNOSIS — Y92002 Bathroom of unspecified non-institutional (private) residence single-family (private) house as the place of occurrence of the external cause: Secondary | ICD-10-CM | POA: Insufficient documentation

## 2023-12-03 DIAGNOSIS — W19XXXA Unspecified fall, initial encounter: Secondary | ICD-10-CM

## 2023-12-03 DIAGNOSIS — S0083XA Contusion of other part of head, initial encounter: Secondary | ICD-10-CM | POA: Diagnosis not present

## 2023-12-03 DIAGNOSIS — R519 Headache, unspecified: Secondary | ICD-10-CM | POA: Diagnosis present

## 2023-12-03 DIAGNOSIS — S0093XA Contusion of unspecified part of head, initial encounter: Secondary | ICD-10-CM

## 2023-12-03 MED ORDER — DICLOFENAC SODIUM 75 MG PO TBEC: 75.0000 mg | DELAYED_RELEASE_TABLET | Freq: Two times a day (BID) | ORAL | 0 refills | Status: DC

## 2023-12-03 MED ORDER — ONDANSETRON 4 MG PO TBDP: 4.0000 mg | ORAL_TABLET | Freq: Three times a day (TID) | ORAL | 0 refills | Status: AC | PRN

## 2023-12-03 MED ORDER — ONDANSETRON 4 MG PO TBDP
4.0000 mg | ORAL_TABLET | Freq: Once | ORAL | Status: AC
  Administered 2023-12-03: 4 mg via ORAL
  Filled 2023-12-03: qty 1

## 2023-12-03 NOTE — ED Triage Notes (Signed)
 Pt arrived via POV c/o head injury from a fall. Pt reports this past Friday, she took prescribed muscle spasm medication and reports her husband heard her hit the bathroom counter and then her toilet. Pt reports there was bleeding and bruising at that time. Pt reports her husband found her on the floor. Pt endorses emesis and dizziness since the fall.

## 2023-12-03 NOTE — ED Notes (Signed)
 Patient transported to CT

## 2023-12-03 NOTE — Discharge Instructions (Addendum)
 Please follow-up closely with neurology on an outpatient basis for any continued symptoms.  Return to emergency department immediately for any new or worsening symptoms.

## 2023-12-03 NOTE — ED Notes (Signed)
 This nurse asked pt does she feel comfortable walking. Pt stated, "I am good to walk." Pt ambulated to ED lobby after receiving discharge instructions.

## 2023-12-03 NOTE — ED Notes (Signed)
 Pt returned from CT. Stated she would like something for nausea. EDP was notified.

## 2023-12-03 NOTE — ED Notes (Addendum)
 Pt is A&O x4. Stated she fell this past weekend after taking a muscle relaxer. Pt's family told her she hit the front of her head falling to the ground then hitting the back of head. Pt doesn't recall anything from that night.   Stated I am constantly nauseous, have a headache, dizzy, light sensitive, and forgetful. Denies neck or worsening back pain at this time. Not on blood thinners.

## 2023-12-03 NOTE — ED Notes (Addendum)
 Pt ambulated to ED room.

## 2023-12-03 NOTE — ED Provider Notes (Signed)
 [place] EMERGENCY DEPARTMENT AT [facility] Provider Note   CSN: [id number] Arrival date & time: 12/03/23  1111     History  Chief Complaint  Patient presents with   Theresa [name] is a 53 y.o. female.  Patient is a 53 year old female who presents emergency department the chief complaint of ongoing headaches, lightheadedness, intermittent nausea and vomiting.  Patient notes that she had a fall approximately 5 days ago in her bathroom after taking a muscle relaxer.  She notes that she struck the front of her head and the back of her head at that time and did lose consciousness.  She notes that she has no known history of bleeding disorders or current anticoagulation use.  She denies any pain to her neck or back.  She denies any associated chest pain, palpitations, shortness of breath, abdominal pain.  She has had no numbness, paresthesias or unilateral weakness.  She denies any repeat syncopal events.   Fall Associated symptoms include headaches.       Home Medications Prior to Admission medications   Medication Sig Start Date End Date Taking? Authorizing Provider  ALPRAZolam Prudy Feeler) 0.5 MG tablet Take 0.5 mg by mouth daily as needed for anxiety. 06/09/19   [provider]  buPROPion (WELLBUTRIN SR) 150 MG 12 hr tablet TAKE 1 TABLET BY MOUTH TWICE A DAY Patient taking differently: Take 150 mg by mouth daily. 01/02/20   Malfi, Jodelle Gross, FNP  dicyclomine (BENTYL) 20 MG tablet Take 1/2 tablet (10 mg total) by mouth every 12 (twelve) hours as needed (abdominal pain). 09/08/23   Carlan, Jeral Pinch, NP  esomeprazole (NEXIUM) 40 MG capsule Take 1 capsule (40 mg total) by mouth 2 (two) times daily before a meal. 04/25/23   Marguerita Merles, Reuel Boom, MD  estradiol (ESTRACE) 1 MG tablet Take 1 tablet (1 mg total) by mouth 2 (two) times daily. 05/11/20   Malfi, Jodelle Gross, FNP  gabapentin (NEURONTIN) 600 MG tablet TAKE 1 TABLET BY MOUTH IN THE MORNING TAKE 1 TABLET IN  THE EVENING AND TAKE 2 TABLETS AT BEDTIME Patient taking differently: Take 600-1,200 mg by mouth See admin instructions. TAKE  600 MG BY MOUTH IN THE MORNING AND AT BEDTIME, 1200 MG AT LUNCHTIME 01/17/20   Malfi, Jodelle Gross, FNP  ibuprofen (ADVIL) 800 MG tablet Take 1 tablet (800 mg total) by mouth every 8 (eight) hours as needed. 11/18/22   Vickki Hearing, MD  lisinopril (ZESTRIL) 20 MG tablet Take 20 mg by mouth daily. 07/19/22   [provider]  lubiprostone (AMITIZA) 24 MCG capsule Take 1 capsule (24 mcg total) by mouth 2 (two) times daily with a meal. 04/21/23   Marguerita Merles, Reuel Boom, MD  methocarbamol (ROBAXIN) 500 MG tablet Take 1 tablet (500 mg total) by mouth 3 (three) times daily. 11/18/22   Vickki Hearing, MD  metoprolol succinate (TOPROL-XL) 25 MG 24 hr tablet TAKE 1 TABLET BY MOUTH EVERY DAY 04/01/20   Malfi, Jodelle Gross, FNP  ondansetron (ZOFRAN) 4 MG tablet Take 1 tablet (4 mg total) by mouth every 8 (eight) hours as needed for nausea or vomiting. 04/21/23   Marguerita Merles, Reuel Boom, MD  sertraline (ZOLOFT) 50 MG tablet Take 50 mg by mouth daily. 07/16/22   [provider]  sodium chloride (MURO 128) 2 % ophthalmic solution Place 1 drop into both eyes in the morning, at noon, and at bedtime.    [provider]  topiramate (TOPAMAX) 25 MG  tablet Take by mouth. 07/16/22   [provider]  traZODone (DESYREL) 100 MG tablet Take 50 mg by mouth at bedtime.    [provider]      Allergies    Other    Review of Systems   Review of Systems  Gastrointestinal:  Positive for nausea and vomiting.  Neurological:  Positive for headaches.    Physical Exam Updated Vital Signs BP (!) 162/102 (BP Location: Right Arm)   Pulse 75   Temp (!) 97.2 F (36.2 C) (Oral)   Resp 18   Ht 5\' 4"  (1.626 m)   Wt 75 kg   LMP  (LMP Unknown) Comment: AUB  SpO2 100%   BMI 28.38 kg/m  Physical Exam Vitals and nursing note reviewed.  Constitutional:       Appearance: Normal appearance.  HENT:     Head: Normocephalic and atraumatic.     Nose: Nose normal.     Mouth/Throat:     Mouth: Mucous membranes are moist.  Eyes:     Extraocular Movements: Extraocular movements intact.     Conjunctiva/sclera: Conjunctivae normal.     Pupils: Pupils are equal, round, and reactive to light.  Neck:     Comments: No step-off or deformity Cardiovascular:     Rate and Rhythm: Normal rate and regular rhythm.     Pulses: Normal pulses.     Heart sounds: Normal heart sounds. No murmur heard.    No gallop.  Pulmonary:     Effort: Pulmonary effort is normal. No respiratory distress.     Breath sounds: Normal breath sounds. No stridor. No wheezing or rales.  Abdominal:     General: Abdomen is flat. Bowel sounds are normal. There is no distension.     Palpations: Abdomen is soft.     Tenderness: There is no abdominal tenderness. There is no guarding.  Musculoskeletal:        General: No swelling, tenderness, deformity or signs of injury. Normal range of motion.     Cervical back: Normal range of motion and neck supple. No rigidity or tenderness.     Comments: Nontender to palpation over thoracic and lumbar spine, no step-off or deformity  Skin:    General: Skin is warm and dry.  Neurological:     General: No focal deficit present.     Mental Status: She is alert and oriented to person, place, and time. Mental status is at baseline.     Cranial Nerves: No cranial nerve deficit.     Sensory: No sensory deficit.     Motor: No weakness.     Coordination: Coordination normal.     Gait: Gait normal.  Psychiatric:        Mood and Affect: Mood normal.        Behavior: Behavior normal.        Thought Content: Thought content normal.        Judgment: Judgment normal.     ED Results / Procedures / Treatments   Labs (all labs ordered are listed, but only abnormal results are displayed) Labs Reviewed - No data to display  EKG None  Radiology No  results found.  Procedures Procedures    Medications Ordered in ED Medications - No data to display  ED Course/ Medical Decision Making/ A&P  Medical Decision Making Amount and/or Complexity of Data Reviewed Radiology: ordered.  Risk Prescription drug management.   This patient presents to the ED for concern of headache, nausea, vomiting differential diagnosis includes postconcussive syndrome, acute intracranial hemorrhage    Additional history obtained:  Additional history obtained from none External records from outside source obtained and reviewed including none   Imaging Studies ordered:  I ordered imaging studies including CT scan of head I independently visualized and interpreted imaging which showed no acute intracranial process I agree with the radiologist interpretation   Medicines ordered and prescription drug management:  I ordered medication including Zofran for nausea Reevaluation of the patient after these medicines showed that the patient improved I have reviewed the patients home medicines and have made adjustments as needed   Problem List / ED Course:  Patient is doing well at this time and is stable for discharge home.  Discussed with patient CT scan of the head demonstrated no signs of acute intracranial hemorrhage or space-occupying lesion.  Suspect that she is suffering from postconcussive syndrome at this time.  She has no concerning neurological deficits noted on physical exam and is able to ambulate without difficulty.  Will continue symptomatic treatment on outpatient basis.  She had no tenderness over cervical, thoracic, lumbar spine.  She had no tenderness over chest wall or abdomen.  There was no tenderness over remainder of long bones and joints.  The need for close follow-up with PCP and neurology were discussed for any continued symptoms.  Patient voiced understanding to the plan and had no additional  questions.   Social Determinants of Health:  None           Final Clinical Impression(s) / ED Diagnoses Final diagnoses:  None    Rx / DC Orders ED Discharge Orders     None         Lelon Perla, PA-C 12/03/23 1306    Derwood Kaplan, MD 12/03/23 1531

## 2023-12-03 NOTE — ED Notes (Signed)
 Pt was heard coughing, this nurse entered room. Pt stated, "I am ok. I haven't thrown up any and the nausea is a little better."

## 2023-12-09 ENCOUNTER — Ambulatory Visit: Admitting: Family Medicine

## 2023-12-09 VITALS — BP 122/80 | Ht 64.0 in | Wt 150.0 lb

## 2023-12-09 DIAGNOSIS — F0781 Postconcussional syndrome: Secondary | ICD-10-CM | POA: Diagnosis not present

## 2023-12-09 DIAGNOSIS — G43809 Other migraine, not intractable, without status migrainosus: Secondary | ICD-10-CM

## 2023-12-09 DIAGNOSIS — F314 Bipolar disorder, current episode depressed, severe, without psychotic features: Secondary | ICD-10-CM

## 2023-12-09 DIAGNOSIS — S060X9A Concussion with loss of consciousness of unspecified duration, initial encounter: Secondary | ICD-10-CM

## 2023-12-09 MED ORDER — DICLOFENAC SODIUM 75 MG PO TBEC: 75.0000 mg | DELAYED_RELEASE_TABLET | Freq: Two times a day (BID) | ORAL | 0 refills | Status: DC

## 2023-12-09 NOTE — Progress Notes (Unsigned)
 DATE OF VISIT: 12/09/2023        Theresa [name] DOB: July 19, 1971 MRN: [id number]  CC: Concussion follow-up  HPI:  Theresa [name] presented to the office today with *** for concussion follow-up.  Theresa [name] sustained a concussion *** LOC on *** while ***.  Last evaluation with me was ***.  Since last visit Theresa [name] reports symptoms *** improving.  Patient is *** in school.  Patient *** doing physical activity.  Patient *** sleeping issues.  Concentration in school is ***.  Patient has been following treatment plan which has included: ***.   MEDICATIONS:    Current Outpatient Medications:    ALPRAZolam (XANAX) 0.5 MG tablet, Take 0.5 mg by mouth daily as needed for anxiety., Disp: , Rfl:    buPROPion (WELLBUTRIN SR) 150 MG 12 hr tablet, TAKE 1 TABLET BY MOUTH TWICE A DAY (Patient taking differently: Take 150 mg by mouth daily.), Disp: 180 tablet, Rfl: 1   diclofenac (VOLTAREN) 75 MG EC tablet, Take 1 tablet (75 mg total) by mouth 2 (two) times daily., Disp: 20 tablet, Rfl: 0   dicyclomine (BENTYL) 20 MG tablet, Take 1/2 tablet (10 mg total) by mouth every 12 (twelve) hours as needed (abdominal pain)., Disp: 90 tablet, Rfl: 1   esomeprazole (NEXIUM) 40 MG capsule, Take 1 capsule (40 mg total) by mouth 2 (two) times daily before a meal., Disp: 30 capsule, Rfl: 11   estradiol (ESTRACE) 1 MG tablet, Take 1 tablet (1 mg total) by mouth 2 (two) times daily., Disp: 180 tablet, Rfl: 0   gabapentin (NEURONTIN) 600 MG tablet, TAKE 1 TABLET BY MOUTH IN THE MORNING TAKE 1 TABLET IN THE EVENING AND TAKE 2 TABLETS AT BEDTIME (Patient taking differently: Take 600-1,200 mg by mouth See admin instructions. TAKE  600 MG BY MOUTH IN THE MORNING AND AT BEDTIME, 1200 MG AT LUNCHTIME), Disp: 120 tablet, Rfl: 3   ibuprofen (ADVIL) 800 MG tablet, Take 1 tablet (800 mg total) by mouth every 8 (eight) hours as needed., Disp: 90 tablet, Rfl: 1   lisinopril (ZESTRIL) 20 MG tablet, Take 20 mg by mouth  daily., Disp: , Rfl:    lubiprostone (AMITIZA) 24 MCG capsule, Take 1 capsule (24 mcg total) by mouth 2 (two) times daily with a meal., Disp: 180 capsule, Rfl: 3   methocarbamol (ROBAXIN) 500 MG tablet, Take 1 tablet (500 mg total) by mouth 3 (three) times daily., Disp: 60 tablet, Rfl: 1   metoprolol succinate (TOPROL-XL) 25 MG 24 hr tablet, TAKE 1 TABLET BY MOUTH EVERY DAY, Disp: 90 tablet, Rfl: 0   ondansetron (ZOFRAN) 4 MG tablet, Take 1 tablet (4 mg total) by mouth every 8 (eight) hours as needed for nausea or vomiting., Disp: 90 tablet, Rfl: 1   ondansetron (ZOFRAN-ODT) 4 MG disintegrating tablet, Take 1 tablet (4 mg total) by mouth every 8 (eight) hours as needed for nausea or vomiting., Disp: 20 tablet, Rfl: 0   sertraline (ZOLOFT) 50 MG tablet, Take 50 mg by mouth daily., Disp: , Rfl:    sodium chloride (MURO 128) 2 % ophthalmic solution, Place 1 drop into both eyes in the morning, at noon, and at bedtime., Disp: , Rfl:    topiramate (TOPAMAX) 25 MG tablet, Take by mouth., Disp: , Rfl:    traZODone (DESYREL) 100 MG tablet, Take 50 mg by mouth at bedtime., Disp: , Rfl:   ALLERGIES:   Allergies  Allergen Reactions   Other Hives    Needs  benadryl when given narcotics     PHYSICAL  EXAMINATION BP 122/80   Ht 5\' 4"  (1.626 m)   Wt 150 lb (68 kg)   LMP  (LMP Unknown) Comment: AUB  BMI 25.75 kg/m  GENERAL: AOx3, no acute distress, sitting comfortable in exam room HEENT: *** PERRLA, EOMI - *** visual tracking, *** nystagmus, *** smooth pursuits,  *** visual field testing RESPIRATORY: *** normal respirations, unlabored, symmetric PSYCH: *** MSK: *** gross  upper extremity and lower extremity strength bilaterally Cervical spine: *** gross deformity, *** ROM.  NEURO: CN II-XII *** intact, *** focal deficits *** Neg Rhomberg, *** gait  SCAT 6 Score: Total symptoms: ***/22 Symptom score: ***/132  Headache    ***/(0-6) "Pressure in head"  ***/(0-6) Neck Pain   ***/(0-6) Nausea or  vomiting  ***/(0-6) Dizziness   ***/(0-6) Blurred vision   ***/(0-6) Balance problems   ***/(0-6) Sensitivity to light  ***/(0-6) Sensitivity to noise  ***/(0-6) Feeling slowed down  ***/(0-6) Feeling "like in a fog"  ***/(0-6) "Don't feel right"  ***/(0-6) Difficulty concentrating  ***/(0-6) Difficulty remembering ***/(0-6) Fatigue or low energy  ***/(0-6) Confusion   ***/(0-6) Drowsiness   ***/(0-6) More emotional  ***/(0-6) Irritability   ***/(0-6) Sadness   ***/(0-6) Nervous or Anxious  ***/(0-6) Trouble falling asleep  ***/(0-6)  Do the symptoms get worse with physical activity?*** Do the symptoms get worse with mental activity?  ***           Previous SCAT score:  ***/22 symptoms, ***/132 severity score on ***  STANDARDIZED ASSESSMENT OF CONCUSSION [facility]): Orientation What month is it? Y What is the date today? Y What is the day of the week? Y What year is it? Y. What time is it right now (within one hour)? N Orientation score: {Numbers; 0-5:140013} /5  Immediate memory (3 trials) List A  List B  List C Jacket Xxx  x Finger  Baby Arrow x Penny  Monkey Pepper  Blanket Perfume Cotton Xxx x Lemon  Sunset Movie Xx  x Insect  Iron Dollar xxx  x  Candle  Elbow Honey Xxx  x Paper  Apple Mirror xx Sugar  Carpet Saddle x Sandwich Saddle Anchor  x Wagon  Bubble  First time:  {Numbers; 0-10:140013}. Second time:  {Numbers; 0-10:140013}. Third time:  {Numbers; 0-10:140013}. Immediate memory score:  {NUMBERS; 0-30:110028}/30.  Concentration (Digits Backward) 4-9-3   6-2-9   5-2-6   4-1-5:   1 3-8-1-4   3-2-7-9    1-7-9-5    4-9-6-8:   1 6-2-9-7-1   1-5-2-8-6   3-8-5-2-7   6-1-8-4-3:   0 7-1-8-4-6-2    5-3-9-1-4-8    8-3-1-9-6-4    7-2-4-8-5-6:   0  Months in reverse order:  1 Dec-Nov-Oct-Sep-Aug-Jul-Jun-May-Apr-Mar-Feb-Jan (Time <30-secs -- 1 point if no errors under 30-secs) Concentration score:  {Numbers; 0-5:140013} /5  COORDINATION & BALANCE EXAM Modified  BESS (20 seconds):  Double leg firm ground:  3 Single leg firm ground:  10 Tandem firm ground:  5 MBESS Total Score:  ***/30  Timed Tandem Gait (walk heel to toe x 3-meters, turn around & come back, as fast as you can) Trial 1: *** Trial 2: *** Trial 3: *** Avg time: ***  Delayed recall score:  {Numbers; 0-10:140013} /10   IMAGING: ***  Assessment:   Concussion *** LOC sustained on *** while ***.  *** lifetime concussion.  Plan: 1.  Observation & Restrictions Sports participation: *** Gym participation: *** 2. School participation:   - ***  - Return to learn protocol 3. Interventions:   - *** 4. Follow-up in *** weeks to reassess. 5. Patient and parent expressed understanding & agreement.  Encouraged to call with any questions or concerns  No diagnosis found.  No orders of the defined types were placed in this encounter.

## 2023-12-10 ENCOUNTER — Encounter: Payer: Self-pay | Admitting: Family Medicine

## 2023-12-10 ENCOUNTER — Encounter: Admitting: Occupational Therapy

## 2023-12-11 ENCOUNTER — Encounter: Payer: Self-pay | Admitting: Physical Therapy

## 2023-12-11 ENCOUNTER — Telehealth: Payer: Self-pay | Admitting: Physical Therapy

## 2023-12-11 ENCOUNTER — Ambulatory Visit: Attending: Family Medicine | Admitting: Physical Therapy

## 2023-12-11 ENCOUNTER — Other Ambulatory Visit: Payer: Self-pay

## 2023-12-11 VITALS — BP 120/76 | HR 67

## 2023-12-11 DIAGNOSIS — R41841 Cognitive communication deficit: Secondary | ICD-10-CM | POA: Diagnosis present

## 2023-12-11 DIAGNOSIS — F0781 Postconcussional syndrome: Secondary | ICD-10-CM | POA: Insufficient documentation

## 2023-12-11 DIAGNOSIS — R41840 Attention and concentration deficit: Secondary | ICD-10-CM | POA: Diagnosis present

## 2023-12-11 DIAGNOSIS — M542 Cervicalgia: Secondary | ICD-10-CM | POA: Diagnosis present

## 2023-12-11 DIAGNOSIS — R29818 Other symptoms and signs involving the nervous system: Secondary | ICD-10-CM | POA: Insufficient documentation

## 2023-12-11 DIAGNOSIS — S060X9A Concussion with loss of consciousness of unspecified duration, initial encounter: Secondary | ICD-10-CM | POA: Diagnosis not present

## 2023-12-11 DIAGNOSIS — M6281 Muscle weakness (generalized): Secondary | ICD-10-CM | POA: Diagnosis present

## 2023-12-11 DIAGNOSIS — R278 Other lack of coordination: Secondary | ICD-10-CM | POA: Insufficient documentation

## 2023-12-11 DIAGNOSIS — R2681 Unsteadiness on feet: Secondary | ICD-10-CM | POA: Diagnosis present

## 2023-12-11 DIAGNOSIS — R42 Dizziness and giddiness: Secondary | ICD-10-CM | POA: Insufficient documentation

## 2023-12-11 NOTE — Therapy (Signed)
 OUTPATIENT PHYSICAL THERAPY VESTIBULAR EVALUATION  Patient Name: Theresa [name] MRN: [id number] DOB:November 14, 1970, 53 y.o., female Today's Date: 12/11/2023  END OF SESSION:  PT End of Session - 12/11/23 1020     Visit Number 1    Number of Visits 7    Date for PT Re-Evaluation 01/15/24    Authorization Type [employer]    PT Start Time 1020    PT Stop Time 1105    PT Time Calculation (min) 45 min    Equipment Utilized During Treatment Gait belt    Activity Tolerance Patient tolerated treatment well    Behavior During Therapy WFL for tasks assessed/performed             Past Medical History:  Diagnosis Date   Anemia    Anxiety    Attention deficit disorder    Bipolar disorder (HCC)    currently no meds- agitation noted during PAT assessment   Depression    GERD (gastroesophageal reflux disease)    resolved   H/O transfusion    17 yrs ago postpartum   Hyperlipidemia    resolved   Hypertension    history of HTN prior to gastric bypass   Hypoglycemia    Left breast abscess 01/22/2017   Sleep apnea, obstructive    cpap used auto settings, resolved gastric bypass   Past Surgical History:  Procedure Laterality Date   ABDOMINAL HYSTERECTOMY     ABDOMINOPLASTY     AUGMENTATION MAMMAPLASTY Bilateral 2016   BIOPSY  02/13/2022   Procedure: BIOPSY;  Surgeon: Dolores Frame, MD;  Location: [facility];  Service: Gastroenterology;;   BREAST BIOPSY Left 01/2017   abscess removed   CESAREAN SECTION     COLON SURGERY     COLONOSCOPY WITH PROPOFOL N/A 07/04/2021   Procedure: COLONOSCOPY WITH PROPOFOL;  Surgeon: Dolores Frame, MD;  Location: [facility];  Service: Gastroenterology;  Laterality: N/A;  2:00 pm   ESOPHAGOGASTRODUODENOSCOPY (EGD) WITH PROPOFOL N/A 02/13/2022   Procedure: ESOPHAGOGASTRODUODENOSCOPY (EGD) WITH PROPOFOL;  Surgeon: Dolores Frame, MD;  Location: [facility];  Service: Gastroenterology;  Laterality: N/A;  815   GASTRIC  ROUX-EN-Y N/A 12/06/2013   Procedure: LAPAROSCOPIC ROUX-EN-Y GASTRIC BYPASS WITH UPPER ENDOSCOPY;  Surgeon: Atilano Ina, MD;  Location: [facility];  Service: General;  Laterality: N/A;   INCISION AND DRAINAGE ABSCESS Left 01/22/2017   Procedure: INCISION AND DRAINAGE LEFT  BREAST ABSCESS;  Surgeon: Claud Kelp, MD;  Location: [facility];  Service: General;  Laterality: Left;   LAPAROTOMY N/A 02/18/2017   Procedure: LAPAROTOMY;  Surgeon: Richarda Overlie, MD;  Location: [facility];  Service: Gynecology;  Laterality: N/A;   POLYPECTOMY  07/04/2021   Procedure: POLYPECTOMY;  Surgeon: Dolores Frame, MD;  Location: [facility];  Service: Gastroenterology;;   SALPINGOOPHORECTOMY Bilateral 02/18/2017   Procedure: BILATERAL SALPINGO OOPHORECTOMY;  Surgeon: Richarda Overlie, MD;  Location: [facility];  Service: Gynecology;  Laterality: Bilateral;   TUBAL LIGATION     UNILATERAL SALPINGECTOMY Right 09/05/2015   Procedure: UNILATERAL SALPINGECTOMY;  Surgeon: Richarda Overlie, MD;  Location: [facility];  Service: Gynecology;  Laterality: Right;   VAGINAL HYSTERECTOMY N/A 09/05/2015   Procedure: HYSTERECTOMY VAGINAL;  Surgeon: Richarda Overlie, MD;  Location: [facility];  Service: Gynecology;  Laterality: N/A;   WISDOM TOOTH EXTRACTION     Patient Active Problem List   Diagnosis Date Noted   Barrett's esophagus without dysplasia 04/25/2023   Heartburn 04/21/2023   Drug-induced liver injury  04/25/2022   IBS (irritable colon syndrome) 04/25/2022   Nausea with vomiting 12/24/2021   Lymphadenopathy 12/08/2019   Left breast lump 12/08/2019   Insomnia 11/09/2019   History of benign adrenal tumor 01/08/2019   Abdominal pain, chronic, left lower quadrant (Primary Area of Pain) 07/27/2018   Chronic left-sided low back pain (Secondary Area of Pain) 07/27/2018   Chronic pain syndrome 07/27/2018   Pharmacologic therapy 07/27/2018   Disorder of skeletal system 07/27/2018   Problems influencing  health status 07/27/2018   Hypoglycemia after GI (gastrointestinal) surgery 04/10/2018   Estrogen deficiency 07/01/2017   Lower abdominal pain 06/03/2017   Alcohol use    Bipolar affect, depressed (HCC) 03/05/2017   Chronic post-traumatic stress disorder (PTSD) 03/05/2017   Bipolar affective disorder, current episode mixed (HCC) 03/05/2017   Neuropathic pain 02/18/2017   Adenomyosis 09/05/2015   Anemia 04/20/2015   Hyperglycemia 08/18/2014   S/P gastric bypass 12/06/2013   GERD (gastroesophageal reflux disease) 10/13/2013   Essential hypertension 10/02/2013   Hyperlipidemia 01/05/2013    PCP: Karl Bales, NP REFERRING PROVIDER: Andi Devon, DO  REFERRING DIAG: S06.0X9A (ICD-10-CM) - Concussion with loss of consciousness, initial encounter F07.81 (ICD-10-CM) - Postconcussive syndrome  THERAPY DIAG:  Dizziness and giddiness - Plan: PT plan of care cert/re-cert  Cervicalgia - Plan: PT plan of care cert/re-cert  Unsteadiness on feet - Plan: PT plan of care cert/re-cert  ONSET DATE: 12/09/2023 (referral date)  Rationale for Evaluation and Treatment: Rehabilitation  SUBJECTIVE:   SUBJECTIVE STATEMENT:  Patient reports that she fell and hit her face on the counter and back of head on toilet with loss of consciousness for about a minute or two; she was on a muscle relaxor for back pain at time of injury. Patient was also throwing up after the injury. She had the accident on Saturday, March 21-22nd. Patient reports since the accident she has had really bad headaches, light sensitivity, twitching eye, difficulty getting words out, balance, and pain in head and neck as well as low back. Patient denies previous concussions. Patient reports that her headaches are constant since accident but are really bad following activity and last for several hours. Patient is not currently on any medications for headaches to her knowledge.   Patient reports that she has done physical therapy for  her back before and reports that it was helpful in the past. Patient reports that if she moves her head a certain way, she feels like her neck is going to "snap off." Patient did not have CT or x-ray of head or neck as she had no point tenderness to these regions when she went into ED.   Pt accompanied by: self - drove self  PERTINENT HISTORY: PTSD, bipolar disorder, chronic back pain and pain disorder  PAIN:  Are you having pain? Yes: NPRS scale: 10/10 primarily in the low back - reports that would not go to ER for pain as she doesn't want more muscle relaxors or pain medication and states wouldn't be able to do anything   Pain location: primarily low back and some in head Pain description: headache and sharp pain in her back with catch feel Aggravating factors: movement certain ways  Relieving factors: stretching and meloxicam  PRECAUTIONS: Fall  RED FLAGS: None   WEIGHT BEARING RESTRICTIONS: No  FALLS: Has patient fallen in last 6 months? Yes. 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  DIAGNOSTIC FINDINGS:   CT Head wo contrast 12/03/2023: IMPRESSION: 1. No evidence of acute intracranial abnormality. 2. Left-sided sinusitis.  COGNITION: Overall cognitive status: Within functional limits for tasks assessed - other than brain fog since injury    SENSATION: Reports some tingling in her head  EDEMA:  Denies any at time of eval  Cervical ROM:    Active A/PROM (deg) eval  Flexion WFL  Extension WFL  Right  lateral flexion Mild restriction ~70% of motion with some pain  Left lateral flexion Mild restriction ~70% of motion with some pain  Right rotation WFL - reports occassional sharp catch when does that at home, painfree in session  Left rotation WFL  (Blank rows = not tested)  PATIENT SURVEYS:   [place] Post Concussion Symptoms Questionnaire:  Compared to before the accident, do you now (last 24 hours) suffer from:  Headaches 4 = severe problem  Feeling of Dizziness 4 = severe problem  Nausea and/or vomiting 3 = moderate problem  RPQ-3 (total of first 3 items) 11     Noise sensitivity (easily upset by loud noises) 2 = mild problem  Sleep disturbances 4 = severe problem  Fatigue, tiring more easily 4 = severe problem  Being irritable, easily angered: 2 = mild problem  Feeling depressed or tearful 2 = mild problem  Feeling frustrated or impatient 4 = severe problem  Forgetfulness, poor memory 4 = severe problem  Poor concentration 3 = moderate problem  Taking longer to think 4 = severe problem  Blurred vision 4 = severe problem  Light sensitivity (easily upset by bright light) 4 = severe problem  Double vision 4 = severe problem  Restlessness 2 = mild problem  RPQ-13 (total for next 13 items) 43    Sharp neck pains: 4 Unsteady: 4 Trouble articulating: 4    VESTIBULAR ASSESSMENT:  GENERAL OBSERVATION: glasses - supposed to wear all of the time but not wearing them for majority of session until fills out questionnaire, squints eyes throughout session and closes eye frequently due to light sensitivity despite room being dimmed   SYMPTOM BEHAVIOR:  Subjective history: see above Non-Vestibular symptoms: changes in vision, diplopia, neck pain, headaches, tinnitus, nausea/vomiting, migraine symptoms, and loss of consciousness Type of dizziness: Blurred Vision, Diplopia, Imbalance (Disequilibrium), Oscillopsia, Spinning/Vertigo, Unsteady with head/body turns, Lightheadedness/Faint,  "Funny feeling in the head", "World moves", and "Swimmyheaded"  Frequency: daily  Duration: constand with intermittent periods that are worse  Aggravating factors:  looking at screens and lights, laying in bed so sleeping in require   Relieving factors: dark room and rest  Progression of symptoms:  depends on the day, it is up and down   OCULOMOTOR EXAM:  Ocular Alignment: normal  Ocular ROM: No Limitations  Spontaneous Nystagmus: absent  Gaze-Induced Nystagmus: absent  Smooth Pursuits: saccades  Saccades: slow and intermittent overshoots  Convergence/Divergence: 3 feet with and without glasses  Test of Skew: Positive bilaterally  TREATMENT:   Self Care: Discussed examination findings, given memory fog and trouble articulating deficits patient reporting discussed SLP referral, explained patient already seeing prior disciplines and asked if still wanted to add to which patient states that would be interested at this time, given back pain recommend follow up with PCP for long term management as may impact this POC, encourage patient to wear glasses as much as possible to reduce eye strain  PATIENT EDUCATION: Education details: POC, goal collaboration, examination findings  Person educated: Patient Education method: Explanation Education comprehension: verbalized understanding and needs further education  HOME EXERCISE PROGRAM:  To be provided   GOALS: Goals reviewed with patient? Yes  LONG TERM GOALS: Target date: 01/15/2024  Patient will report demonstrate independence with final HEP in order to maintain current gains and continue to progress after physical therapy discharge.   Baseline: To be provided  Goal status: INITIAL  2.  Patient will improve RPQ-13 score to </= 35 per item to indicate reduced severity of post-concussive symptoms to progress towards PLOF.    Baseline: 43 Goal status: INITIAL  3.  Patient will improve RPQ-3 score to </= 7 per item to indicate reduced severity of post-concussive symptoms to progress towards PLOF.   Baseline: 11 Goal status: INITIAL  4.  Patient will report self-written in score on RPQ for neck pain as =< 3/4 to demonstrate improved cervical function.  Baseline: 4/4 Goal status: INITIAL  5.  MSQ to be assessed / LTG written Baseline: To be assessed Goal status: INITIAL  6.  mCTSIB to be assessed / LTG written Baseline: To be assessed Goal status: INITIAL  ASSESSMENT:  CLINICAL IMPRESSION: Patient is a 53 y.o. female who was seen today for physical therapy evaluation and treatment for post-concussion symptoms. Patient is highly symptomatic and given long chronic history of back pain, anticipate that this may impact POC. Patient session held in dimmed room for tolerance. Patient with high reports of symptoms on [place] and presents with impaired saccades, smooth pursuits, convergence > 3 feet, and positive test of Skew. Patient has not had neck imaging and does have high reports of neck pain but was not tender to palpation to ED along cervical region so imaging for neck ruled out at this time. Patient will benefit from skilled physical therapy services to address deficits and progress towards return to work and PLOF.   OBJECTIVE IMPAIRMENTS: decreased balance, dizziness, and pain.   ACTIVITY LIMITATIONS: lifting, standing, sleeping, reach over head, and caring for others  PARTICIPATION LIMITATIONS: meal prep, laundry, community activity, and occupation  PERSONAL FACTORS: Age, Time since onset of injury/illness/exacerbation, and 1-2 comorbidities: see above  are also affecting patient's functional outcome.   REHAB POTENTIAL: Fair highly symptomatic and long history of chronic low back pain  CLINICAL DECISION MAKING: Evolving/moderate complexity  EVALUATION COMPLEXITY: Moderate   PLAN:  PT  FREQUENCY: 2x/week (ideally but may have to modify frequency given copay)  PT DURATION: 3 weeks (anticipate may   PLANNED INTERVENTIONS: 97164- PT Re-evaluation, 97110-Therapeutic exercises, 97530- Therapeutic activity, 97112- Neuromuscular re-education, 97535- Self Care, 16109- Manual therapy, and 97116- Gait training  PLAN FOR NEXT SESSION:  Not planning on addressing back pain as long term chronic issue - may need to refer out, finish vestibular testing and write LTG, holding in [place] Concussion given severity of symptoms at this time  Assess mCTSIB and MSQ - write goals   Carmelia Bake, PT, DPT 12/11/2023, 12:26 PM

## 2023-12-11 NOTE — Telephone Encounter (Signed)
 Dr. Christella Hartigan,  Theresa [name] was evaluated by physical therapy on 12/11/2023.  The patient would benefit from SLP evaluation for cognitive and memory deficits associated with concussion as well as reports of trouble articulating words since accident.    If you agree, please place an order in [facility] workque in [facility] or fax the order to [phone].  Thank you, Maryruth Eve, PT, DPT   [facility] [address] [place], [place]  [address] Phone:  [phone] Fax:  [phone]

## 2023-12-15 ENCOUNTER — Ambulatory Visit

## 2023-12-15 ENCOUNTER — Ambulatory Visit: Admitting: Occupational Therapy

## 2023-12-15 ENCOUNTER — Telehealth: Payer: Self-pay

## 2023-12-15 ENCOUNTER — Encounter: Payer: Self-pay | Admitting: Family Medicine

## 2023-12-15 ENCOUNTER — Telehealth: Payer: Self-pay | Admitting: Family Medicine

## 2023-12-15 ENCOUNTER — Other Ambulatory Visit: Payer: Self-pay

## 2023-12-15 VITALS — BP 120/75 | HR 60

## 2023-12-15 DIAGNOSIS — R42 Dizziness and giddiness: Secondary | ICD-10-CM | POA: Diagnosis not present

## 2023-12-15 DIAGNOSIS — R278 Other lack of coordination: Secondary | ICD-10-CM

## 2023-12-15 DIAGNOSIS — M6281 Muscle weakness (generalized): Secondary | ICD-10-CM

## 2023-12-15 DIAGNOSIS — R2681 Unsteadiness on feet: Secondary | ICD-10-CM

## 2023-12-15 DIAGNOSIS — R29818 Other symptoms and signs involving the nervous system: Secondary | ICD-10-CM

## 2023-12-15 DIAGNOSIS — R41840 Attention and concentration deficit: Secondary | ICD-10-CM

## 2023-12-15 DIAGNOSIS — M542 Cervicalgia: Secondary | ICD-10-CM

## 2023-12-15 NOTE — Telephone Encounter (Signed)
 Tried calling patient 12/15/23 at 5:50 p.m. EST.  Calling to follow-up on previous call from ED earlier today when I received her voicemail.  I received a message from physical therapist with concern that patient has been having bowel/bladder changes, as well as saddle paresthesias since her recent head injury.  She does have history of chronic low back pain.  Based on the symptoms I called and left her voicemail indicating that she should go to the emergency department or urgent care for immediate evaluation.  I also sent her a MyChart message which has not yet been reviewed.  I again got her voicemail, again left voicemail advising she should go to the emergency room or urgent care for further evaluation.  Also advised that she review the MyChart message I sent earlier and respond so I know she received the message.

## 2023-12-15 NOTE — Therapy (Signed)
 OUTPATIENT OCCUPATIONAL THERAPY NEURO EVALUATION  Patient Name: Theresa [name] MRN: [id number] DOB:06-03-1971, 53 y.o., female Today's Date: 12/15/2023  PCP: Karl Bales, NP  REFERRING PROVIDER: Andi Devon, DO   END OF SESSION:  OT End of Session - 12/15/23 1249     Visit Number 1    Number of Visits 7   including eval   Date for OT Re-Evaluation 01/16/24    Authorization Type [employer] [id number]    OT Start Time 0930    OT Stop Time 1015    OT Time Calculation (min) 45 min    Activity Tolerance No increased pain;Patient limited by fatigue    Behavior During Therapy [facility] for tasks assessed/performed             Past Medical History:  Diagnosis Date   Anemia    Anxiety    Attention deficit disorder    Bipolar disorder (HCC)    currently no meds- agitation noted during PAT assessment   Depression    GERD (gastroesophageal reflux disease)    resolved   H/O transfusion    17 yrs ago postpartum   Hyperlipidemia    resolved   Hypertension    history of HTN prior to gastric bypass   Hypoglycemia    Left breast abscess 01/22/2017   Sleep apnea, obstructive    cpap used auto settings, resolved gastric bypass   Past Surgical History:  Procedure Laterality Date   ABDOMINAL HYSTERECTOMY     ABDOMINOPLASTY     AUGMENTATION MAMMAPLASTY Bilateral 2016   BIOPSY  02/13/2022   Procedure: BIOPSY;  Surgeon: Dolores Frame, MD;  [facility]: [facility];  Service: Gastroenterology;;   BREAST BIOPSY Left 01/2017   abscess removed   CESAREAN SECTION     COLON SURGERY     COLONOSCOPY WITH PROPOFOL N/A 07/04/2021   Procedure: COLONOSCOPY WITH PROPOFOL;  Surgeon: Dolores Frame, MD;  Location: [facility];  Service: Gastroenterology;  Laterality: N/A;  2:00 pm   ESOPHAGOGASTRODUODENOSCOPY (EGD) WITH PROPOFOL N/A 02/13/2022   Procedure: ESOPHAGOGASTRODUODENOSCOPY (EGD) WITH PROPOFOL;  Surgeon: Dolores Frame, MD;  [facility]: [facility];   Service: Gastroenterology;  Laterality: N/A;  815   GASTRIC ROUX-EN-Y N/A 12/06/2013   Procedure: LAPAROSCOPIC ROUX-EN-Y GASTRIC BYPASS WITH UPPER ENDOSCOPY;  Surgeon: Atilano Ina, MD;  Location: [facility];  Service: General;  Laterality: N/A;   INCISION AND DRAINAGE ABSCESS Left 01/22/2017   Procedure: INCISION AND DRAINAGE LEFT  BREAST ABSCESS;  Surgeon: Claud Kelp, MD;  Location: [facility];  Service: General;  Laterality: Left;   LAPAROTOMY N/A 02/18/2017   Procedure: LAPAROTOMY;  Surgeon: Richarda Overlie, MD;  Location: [facility];  Service: Gynecology;  Laterality: N/A;   POLYPECTOMY  07/04/2021   Procedure: POLYPECTOMY;  Surgeon: Dolores Frame, MD;  [facility]: [facility];  Service: Gastroenterology;;   SALPINGOOPHORECTOMY Bilateral 02/18/2017   Procedure: BILATERAL SALPINGO OOPHORECTOMY;  Surgeon: Richarda Overlie, MD;  Location: [facility];  Service: Gynecology;  Laterality: Bilateral;   TUBAL LIGATION     UNILATERAL SALPINGECTOMY Right 09/05/2015   Procedure: UNILATERAL SALPINGECTOMY;  Surgeon: Richarda Overlie, MD;  Location: [facility];  Service: Gynecology;  Laterality: Right;   VAGINAL HYSTERECTOMY N/A 09/05/2015   Procedure: HYSTERECTOMY VAGINAL;  Surgeon: Richarda Overlie, MD;  Location: [facility];  Service: Gynecology;  Laterality: N/A;   WISDOM TOOTH EXTRACTION     Patient Active Problem List   Diagnosis Date Noted   Barrett's esophagus  without dysplasia 04/25/2023   Heartburn 04/21/2023   Drug-induced liver injury 04/25/2022   IBS (irritable colon syndrome) 04/25/2022   Nausea with vomiting 12/24/2021   Lymphadenopathy 12/08/2019   Left breast lump 12/08/2019   Insomnia 11/09/2019   History of benign adrenal tumor 01/08/2019   Abdominal pain, chronic, left lower quadrant (Primary Area of Pain) 07/27/2018   Chronic left-sided low back pain (Secondary Area of Pain) 07/27/2018   Chronic pain syndrome 07/27/2018   Pharmacologic therapy 07/27/2018    Disorder of skeletal system 07/27/2018   Problems influencing health status 07/27/2018   Hypoglycemia after GI (gastrointestinal) surgery 04/10/2018   Estrogen deficiency 07/01/2017   Lower abdominal pain 06/03/2017   Alcohol use    Bipolar affect, depressed (HCC) 03/05/2017   Chronic post-traumatic stress disorder (PTSD) 03/05/2017   Bipolar affective disorder, current episode mixed (HCC) 03/05/2017   Neuropathic pain 02/18/2017   Adenomyosis 09/05/2015   Anemia 04/20/2015   Hyperglycemia 08/18/2014   S/P gastric bypass 12/06/2013   GERD (gastroesophageal reflux disease) 10/13/2013   Essential hypertension 10/02/2013   Hyperlipidemia 01/05/2013    ONSET DATE: 12/09/23 (referral date)  REFERRING DIAG:  S06.0X9A (ICD-10-CM) - Concussion with loss of consciousness, initial encounter  F07.81 (ICD-10-CM) - Postconcussive syndrome   Per 12/09/23 referral notes: "Post concussion therapy-visual spacial deficits"   THERAPY DIAG:  Other lack of coordination  Muscle weakness (generalized)  Other symptoms and signs involving the nervous system  Attention and concentration deficit  Rationale for Evaluation and Treatment: Rehabilitation  SUBJECTIVE:   SUBJECTIVE STATEMENT: When OT re-read subjective information from PT eval (see below), pt reported the information was "pretty much right on." Pt reported no updates and no changes to headaches. Pt reported "a pinch" in back of neck. Pt reported "it's tight." Pt reported difficulty with bending over d/t dizziness and nausea if doing too much activity (e.g. yardwork). Pt reported hands feel stiff since  the fall "pretty constant." Pt reported no trouble with driving "no more than usual." Pt reported neurology appointment scheduled for 12/17/23.   Per 12/11/23 PT Evaluation Note: "Patient reports that she fell and hit her face on the counter and back of head on toilet with loss of consciousness for about a minute or two; she was on a muscle relaxor  for back pain at time of injury. Patient was also throwing up after the injury. She had the accident on Saturday, March 21-22nd. Patient reports since the accident she has had really bad headaches, light sensitivity, twitching eye, difficulty getting words out, balance, and pain in head and neck as well as low back. Patient denies previous concussions. Patient reports that her headaches are constant since accident but are really bad following activity and last for several hours. Patient is not currently on any medications for headaches to her knowledge.    Patient reports that she has done physical therapy for her back before and reports that it was helpful in the past. Patient reports that if she moves her head a certain way, she feels like her neck is going to "snap off." Patient did not have CT or x-ray of head or neck as she had no point tenderness to these regions when she went into ED."   Pt accompanied by: self - pt drove self  PERTINENT HISTORY: PTSD, bipolar disorder, chronic back pain and pain disorder   CT Head wo contrast 12/03/2023: IMPRESSION: 1. No evidence of acute intracranial abnormality. 2. Left-sided sinusitis.  PRECAUTIONS: FALL  WEIGHT BEARING RESTRICTIONS:  No  PAIN:  Are you having pain? 5/10 headache, back of neck, B hands R> L  FALLS: Has patient fallen in last 6 months? Yes. 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  HAND DOMINANCE: Right  ADLs: Overall ADLs:   Transfers/ambulation related to ADLs: Eating: ind, no difficulty. Pt reported reduced appetite since the injury. Grooming: ind, difficulty grasping toothbrush "when my hands stiffen up" - toothbrush sometimes fall out of pt's mouth/hand, "I make a mess." UB Dressing: ind, stiffness near neck so some difficulty with lifting arm  "it feels like a sharp pinch, not painful." Pt has not attempted buttons/zippers. LB Dressing: ind, difficult d/t low back pain. Completes task sitting Toileting: ind Bathing: ind, standing though has shower chair available in the shower if pt gets dizzy, walk-in shower  Tub Shower transfers: no difficulty Equipment: Walk in shower  IADLs: Shopping: family assists, pt previously completed grocery shopping Light housekeeping: Pt reported "some of it I'm okay" (washing dishes, wiping counters, folding laundry) though family assists with sweeping and transferring clothes for laundry (pt experiences dizziness when bending at waist) Meal Prep: "I haven't really been eating." Pt reported last eating last night around 6 PM. Pt reported drinking water for hydration.  Community mobility: drove self Medication management: family assists for set-up, uses medication organizer  Handwriting:  Pt wrote name and simple sentence with 100% legibility. No concerns noted. Pt sometimes stretched hand likely d/t stiffness. Pt reported "it hurts" across hand when holding pen.  Occupation: Middle Engineer, site. Difficulty with picking up objects, pt reported preference to work on "more like FM skills without it hurting so bad"   MOBILITY STATUS:  ind  POSTURE COMMENTS:  Sitting balance ind  ACTIVITY TOLERANCE: Activity tolerance: Pt reported nausea and dizziness if working on a task.  Pt reported nausea currently.  FUNCTIONAL OUTCOME MEASURES: PSFS: 3.6    Total score = sum of the activity scores/number of activities Minimum detectable change (90%CI) for average score = 2  points Minimum detectable change (90%CI) for single activity score = 3 points   UPPER EXTREMITY ROM:    Active ROM Right eval Left eval  Shoulder flexion Slightly less AROM on R side d/t neck pain, though [facility]  Shoulder abduction    Shoulder adduction    Shoulder extension    Shoulder internal rotation    Shoulder external rotation    Elbow flexion [facility] WFL  Elbow extension    Wrist flexion    Wrist extension    Wrist ulnar deviation    Wrist radial deviation    Wrist pronation    Wrist supination    (Blank rows = not tested)  BUE Composite digit flex/ext - WFL  UPPER EXTREMITY MMT:     MMT Right eval Left eval  Shoulder flexion    Shoulder abduction    Shoulder adduction    Shoulder extension    Shoulder internal rotation    Shoulder external rotation    Middle trapezius  Lower trapezius    Elbow flexion    Elbow extension    Wrist flexion    Wrist extension    Wrist ulnar deviation    Wrist radial deviation    Wrist pronation    Wrist supination    (Blank rows = not tested)  HAND FUNCTION: Grip strength: Right: 39.4, 24.9, 25.7 (30 lbs average) lbs; Left: 39.4, 44.3, 47.6 (43.7 lbs average)  lbs  COORDINATION:  9-hole peg test: Right: 45 lbs; Left: 41 lbs  RUE: Pt demo'd difficulty with initial grasp of item. Pt paused to stretch out fingers 2x near end of task. Pt reported 7/10 pain level. Pt reported "the more I tried using it, the more it hurts in wrist."  LUE: Pt paused to stretch out fingers 5x. Pt reported pain of hand and pointed out over thenar eminence. Pt reported 4/10 pain level.  SENSATION: Pt denied tingling/numbness in B arm/shoulder/hands. Pt reported tingling in head "all the time" 10/10.  EDEMA: none noted  MUSCLE TONE: RUE: Within functional limits and LUE: Within functional limits  COGNITION:  Overall cognitive status: Within functional limits for tasks assessed - other than brain fog since injury   VISION: Subjective  report: Pt reported near-sighted in one eye and far-sighted in the other eye. Pt reported needing to wear glasses all the time. Pt reported glasses help though pt reported difficulty tolerating pressure of glasses and pointed out under eye the glasses "doesn't even touch but it hurts here." Pt reported pain/pressure decreases when removing glasses.  Baseline vision: Wears glasses all the time Visual history: None noted  VISION ASSESSMENT: Not tested  PERCEPTION: Not tested  PRAXIS: Not tested  OBSERVATIONS: Pt was pleasant. Pt ambulated without A/E. When seated and not participating in conversation or a task, pt sometimes closed eyes and held her head in her hands. Pt benefited from low-light environment d/t headache pain. Pt often stretched out hands briefly during functional tasks involving grasp secondary to B hand pain based on pt report.                                                                                                                             TREATMENT DATE: 12/15/23    Self-Care OT educated pt on OT role, POC, clinic late/no-show policy, dx, importance of hydration and nutrition, UE anatomy, strategies and simple foods to promote eating on schedule d/t pt reporting not eating very often. Pt acknowledged understanding of all.   PATIENT EDUCATION: Education details: see today's tx above Person educated: Patient Education method: Explanation Education comprehension: verbalized understanding  HOME EXERCISE PROGRAM: TBD   GOALS: Goals reviewed with patient? Yes  SHORT TERM GOALS: Target date: 01/02/24  Pt will be ind with initial HEP using visual handouts. Baseline: new to outpt OT Goal status: INITIAL  2.  Pt will ind recall at least 3 energy conservation strategies. Baseline: pt reported fatigue, symptoms of nausea and dizziness when completing functional  tasks Goal status: INITIAL  3.  Pt will demo understanding of adaptive strategies and A/E to reduce  pain and improve efficiency with ADLs/IADLs. Baseline: Pt reported difficulty with UB/LB dressing d/t pain, difficulty with sweeping and laundry d/t symptoms of nausea and dizziness, no appetite for food and not eating as often. Goal status: INITIAL  4.  Pt will recall at least 3 strategies to reduce pain/stiffness of BUE. Baseline: Pt reported pain/stiffness of B hands during functional tasks. Goal status: INITIAL  LONG TERM GOALS: Target date: 01/16/24  Patient will report at least two-point increase in average PSFS score or at least three-point increase in a single activity score indicating functionally significant improvement given minimum detectable change. Baseline: PSFS: 3.6 total score (See above for individual activity scores)  Goal status: INITIAL  2.  Patient will maintain at least 30 lbs RUE grip strength across all 3 trials as needed to grasp toothbrush and writing utensils. Baseline: Grip strength: Right: 39.4, 24.9, 25.7 (30 lbs average) lbs; Left: 39.4, 44.3, 47.6 (43.7 lbs average)  lbs Goal status: INITIAL  3.  Patient will demo improved FM coordination as evidenced by completing nine-hole peg with use of RUE in 40 seconds or less.  Baseline: 9-hole peg test: Right: 45 lbs; Left: 41 lbs Goal status: INITIAL  ASSESSMENT:  CLINICAL IMPRESSION: Patient is a 53 y.o. female who was seen today for occupational therapy evaluation for Concussion with loss of consciousness and postconcussive syndrome. Hx includes PTSD, bipolar disorder, chronic back pain and pain disorder. Patient currently presents below baseline level of functioning demonstrating functional deficits and impairments as noted below. Pt would benefit from skilled OT services in the outpatient setting to work on impairments as noted below to help pt return to PLOF as able.     PERFORMANCE DEFICITS: in functional skills including ADLs, IADLs, coordination, dexterity, proprioception, sensation, ROM, strength, pain,  flexibility, Fine motor control, Gross motor control, mobility, body mechanics, endurance, vision, and UE functional use, cognitive skills including attention and energy/drive, and psychosocial skills including environmental adaptation.   IMPAIRMENTS: are limiting patient from ADLs, IADLs, rest and sleep, work, leisure, and social participation.   CO-MORBIDITIES: may have co-morbidities  that affects occupational performance. Patient will benefit from skilled OT to address above impairments and improve overall function.  MODIFICATION OR ASSISTANCE TO COMPLETE EVALUATION: Min-Moderate modification of tasks or assist with assess necessary to complete an evaluation.  OT OCCUPATIONAL PROFILE AND HISTORY: Detailed assessment: Review of records and additional review of physical, cognitive, psychosocial history related to current functional performance.  CLINICAL DECISION MAKING: Moderate - several treatment options, min-mod task modification necessary  REHAB POTENTIAL: Good  EVALUATION COMPLEXITY: Moderate    PLAN:  OT FREQUENCY: 1-2x/week  OT DURATION: 3 weeks   PLANNED INTERVENTIONS: 97168 OT Re-evaluation, 97535 self care/ADL training, 16109 therapeutic exercise, 97530 therapeutic activity, 97112 neuromuscular re-education, 97140 manual therapy, 97035 ultrasound, 97018 paraffin, 60454 fluidotherapy, 97010 moist heat, 97010 cryotherapy, passive range of motion, functional mobility training, visual/perceptual remediation/compensation, energy conservation, patient/family education, and DME and/or AE instructions  RECOMMENDED OTHER SERVICES: PT eval completed, SLP eval scheduled  CONSULTED AND AGREED WITH PLAN OF CARE: Patient  PLAN FOR NEXT SESSION:   HEP: Tendon glides, FM coordination, and theraputty Energy conservation handout A/E and adaptive strategies for laundry, sweeping, dressing, built-up handles for toothbrush/pen, reacher for picking up items from floor   Wynetta Emery,  OT 12/15/2023, 1:21 PM

## 2023-12-15 NOTE — Telephone Encounter (Signed)
 Dr. Christella Hartigan,  Theresa [name] is being seen by PT for post concussion.  The patient does report onset of bowel/bladder changes, as well as saddle paresthesia since her injury. She may benefit from additional imaging given these symptoms and her history of chronic low back pain.   Thank you, Westley Foots, PT, DPT, [facility] [address] [place], [place]  [address] Phone:  [phone] Fax:  [phone]

## 2023-12-15 NOTE — Therapy (Signed)
 OUTPATIENT PHYSICAL THERAPY VESTIBULAR TREATMENT  Patient Name: Theresa [name] MRN: [id number] DOB:March 12, 1971, 53 y.o., female Today's Date: 12/15/2023  END OF SESSION:  PT End of Session - 12/15/23 0938     Visit Number 2    Number of Visits 7    Date for PT Re-Evaluation 01/15/24    Authorization Type [employer]    PT Start Time 1014    PT Stop Time 1059    PT Time Calculation (min) 45 min    Activity Tolerance Treatment limited secondary to medical complications (Comment)   dizzy/nausea   Behavior During Therapy [facility] for tasks assessed/performed             Past Medical History:  Diagnosis Date   Anemia    Anxiety    Attention deficit disorder    Bipolar disorder (HCC)    currently no meds- agitation noted during PAT assessment   Depression    GERD (gastroesophageal reflux disease)    resolved   H/O transfusion    17 yrs ago postpartum   Hyperlipidemia    resolved   Hypertension    history of HTN prior to gastric bypass   Hypoglycemia    Left breast abscess 01/22/2017   Sleep apnea, obstructive    cpap used auto settings, resolved gastric bypass   Past Surgical History:  Procedure Laterality Date   ABDOMINAL HYSTERECTOMY     ABDOMINOPLASTY     AUGMENTATION MAMMAPLASTY Bilateral 2016   BIOPSY  02/13/2022   Procedure: BIOPSY;  Surgeon: Dolores Frame, MD;  [facility];  Service: Gastroenterology;;   BREAST BIOPSY Left 01/2017   abscess removed   CESAREAN SECTION     COLON SURGERY     COLONOSCOPY WITH PROPOFOL N/A 07/04/2021   Procedure: COLONOSCOPY WITH PROPOFOL;  Surgeon: Dolores Frame, MD;  [facility];  Service: Gastroenterology;  Laterality: N/A;  2:00 pm   ESOPHAGOGASTRODUODENOSCOPY (EGD) WITH PROPOFOL N/A 02/13/2022   Procedure: ESOPHAGOGASTRODUODENOSCOPY (EGD) WITH PROPOFOL;  Surgeon: Dolores Frame, MD;  [facility];  Service: Gastroenterology;  Laterality: N/A;  815   GASTRIC  ROUX-EN-Y N/A 12/06/2013   Procedure: LAPAROSCOPIC ROUX-EN-Y GASTRIC BYPASS WITH UPPER ENDOSCOPY;  Surgeon: Atilano Ina, MD;  Location: [facility];  Service: General;  Laterality: N/A;   INCISION AND DRAINAGE ABSCESS Left 01/22/2017   Procedure: INCISION AND DRAINAGE LEFT  BREAST ABSCESS;  Surgeon: Claud Kelp, MD;  Location: [facility];  Service: General;  Laterality: Left;   LAPAROTOMY N/A 02/18/2017   Procedure: LAPAROTOMY;  Surgeon: Richarda Overlie, MD;  Location: [facility];  Service: Gynecology;  Laterality: N/A;   POLYPECTOMY  07/04/2021   Procedure: POLYPECTOMY;  Surgeon: Dolores Frame, MD;  [facility];  Service: Gastroenterology;;   SALPINGOOPHORECTOMY Bilateral 02/18/2017   Procedure: BILATERAL SALPINGO OOPHORECTOMY;  Surgeon: Richarda Overlie, MD;  Location: [facility];  Service: Gynecology;  Laterality: Bilateral;   TUBAL LIGATION     UNILATERAL SALPINGECTOMY Right 09/05/2015   Procedure: UNILATERAL SALPINGECTOMY;  Surgeon: Richarda Overlie, MD;  Location: [facility];  Service: Gynecology;  Laterality: Right;   VAGINAL HYSTERECTOMY N/A 09/05/2015   Procedure: HYSTERECTOMY VAGINAL;  Surgeon: Richarda Overlie, MD;  Location: [facility];  Service: Gynecology;  Laterality: N/A;   WISDOM TOOTH EXTRACTION     Patient Active Problem List   Diagnosis Date Noted   Barrett's esophagus without dysplasia 04/25/2023   Heartburn 04/21/2023   Drug-induced liver injury 04/25/2022   IBS (  irritable colon syndrome) 04/25/2022   Nausea with vomiting 12/24/2021   Lymphadenopathy 12/08/2019   Left breast lump 12/08/2019   Insomnia 11/09/2019   History of benign adrenal tumor 01/08/2019   Abdominal pain, chronic, left lower quadrant (Primary Area of Pain) 07/27/2018   Chronic left-sided low back pain (Secondary Area of Pain) 07/27/2018   Chronic pain syndrome 07/27/2018   Pharmacologic therapy 07/27/2018   Disorder of skeletal system 07/27/2018   Problems influencing  health status 07/27/2018   Hypoglycemia after GI (gastrointestinal) surgery 04/10/2018   Estrogen deficiency 07/01/2017   Lower abdominal pain 06/03/2017   Alcohol use    Bipolar affect, depressed (HCC) 03/05/2017   Chronic post-traumatic stress disorder (PTSD) 03/05/2017   Bipolar affective disorder, current episode mixed (HCC) 03/05/2017   Neuropathic pain 02/18/2017   Adenomyosis 09/05/2015   Anemia 04/20/2015   Hyperglycemia 08/18/2014   S/P gastric bypass 12/06/2013   GERD (gastroesophageal reflux disease) 10/13/2013   Essential hypertension 10/02/2013   Hyperlipidemia 01/05/2013    PCP: Karl Bales, NP REFERRING PROVIDER: Andi Devon, DO  REFERRING DIAG: S06.0X9A (ICD-10-CM) - Concussion with loss of consciousness, initial encounter F07.81 (ICD-10-CM) - Postconcussive syndrome  THERAPY DIAG:  Dizziness and giddiness  Cervicalgia  Unsteadiness on feet  ONSET DATE: 12/09/2023 (referral date)  Rationale for Evaluation and Treatment: Rehabilitation  SUBJECTIVE:   SUBJECTIVE STATEMENT: Patient received from OT eval. Moved into dim treatment room. HA is at a 9/10 and feels "tingly." Also endorses increased difficulty with word finding. Denies falls. Reports having done a lot over the weekend with her grandkids.   Pt accompanied by: self - drove self  PERTINENT HISTORY: PTSD, bipolar disorder, chronic back pain and pain disorder  PAIN:  Are you having pain? Yes: NPRS scale: 9/10 Pain location: HA Pain description: "tingly"  PRECAUTIONS: Fall  PATIENT GOALS: "To be able to move without pain."   OBJECTIVE:  Note: Objective measures were completed at Evaluation unless otherwise noted.  DIAGNOSTIC FINDINGS:   CT Head wo contrast 12/03/2023: IMPRESSION: 1. No evidence of acute intracranial abnormality. 2. Left-sided sinusitis.                                                TREATMENT:  Theract: -seated horizontal saccades   -started at distance and  progressed to closer   -completed monocularly with increased challenge with R eye   -seated Brock string   -only able to complete with 2 beads given significance of convergence distance ~20in (with glasses on)  Motion Sensitivity Quotient  Intensity: 0 = none, 1 = Lightheaded, 2 = Mild, 3 = Moderate, 4 = Severe, 5 = Vomiting  Intensity  1. Sitting to supine   2. Supine to L side   3. Supine to R side   4. Supine to sitting   5. L Hallpike-Dix   6. Up from L    7. R Hallpike-Dix   8. Up from R    9. Sitting, head  tipped to L knee 4  10. Head up from L  knee 4+  11. Sitting, head  tipped to R knee 4  12. Head up from R  knee 4+  13. Sitting head turns x5 1  14.Sitting head nods x5 1  15. In stance, 180  turn to L  1  16. In stance, 180  turn  to R 4  -patient does report B/B changes with episode of fecal incontinence yesterday and onset of saddle paresthesias  -PT instructing patient to contact PCP and mention to neurologist as she did not have additional lumbar imaging with initial injury   PATIENT EDUCATION: Education details: see above, initial HEP Person educated: Patient Education method: Explanation, Demonstration, and Handouts Education comprehension: verbalized understanding and needs further education  HOME EXERCISE PROGRAM:  -Mal Amabile string  -saccades   Sitting, slowly bend head down and pick up object on the floor. Return to upright position. Hold position until symptoms subside. Repeat __3__ times per session. Do __3__ sessions per day. Head Motion: Side to Side    Sitting, tilt head down 15-30, slowly move head to right with eyes open. Hold position until symptoms subside. Then, move head slowly to opposite side. Hold position until symptoms subside. Repeat __10__ times per session. Do ___3_ sessions per day. Head Motion: Up and Down    Sitting, slowly move head up with eyes open. Hold position until symptoms subside. Then, move head in opposite  direction. Hold position until symptoms subside. Repeat __10__ times per session. Do __3__ sessions per day. GOALS: Goals reviewed with patient? Yes  LONG TERM GOALS: Target date: 01/15/2024  Patient will report demonstrate independence with final HEP in order to maintain current gains and continue to progress after physical therapy discharge.   Baseline: To be provided  Goal status: INITIAL  2.  Patient will improve RPQ-13 score to </= 35 per item to indicate reduced severity of post-concussive symptoms to progress towards PLOF.   Baseline: 43 Goal status: INITIAL  3.  Patient will improve RPQ-3 score to </= 7 per item to indicate reduced severity of post-concussive symptoms to progress towards PLOF.   Baseline: 11 Goal status: INITIAL  4.  Patient will report self-written in score on RPQ for neck pain as =< 3/4 to demonstrate improved cervical function.  Baseline: 4/4 Goal status: INITIAL  5.  Pt will report </= 2/5 for all movements on MSQ to indicate improvement in motion sensitivity and improved activity tolerance.   Baseline: 4+/5 Goal status: REVISED  6.  mCTSIB to be assessed / LTG written Baseline: To be assessed Goal status: INITIAL  ASSESSMENT:  CLINICAL IMPRESSION: Patient seen for skilled PT session with emphasis on continuing vestibular assessment and initiating HEP. She does report significant HA and onset of substantial dizziness with nausea, but no vomiting. With her glasses, her convergence did improve, but remains profoundly abnormal contributing to the severity of her symptoms and impairment in her daily function. Of note, she does report onset of B/B changes (an episode of bowel incontinence yesterday) and saddle paresthesias. PT instructing patient to reach out to PCP and mention to neuro on Wednesday. PT to reach out to PCP as well. Continue POC as able.   OBJECTIVE IMPAIRMENTS: decreased balance, dizziness, and pain.   ACTIVITY LIMITATIONS: lifting,  standing, sleeping, reach over head, and caring for others  PARTICIPATION LIMITATIONS: meal prep, laundry, community activity, and occupation  PERSONAL FACTORS: Age, Time since onset of injury/illness/exacerbation, and 1-2 comorbidities: see above  are also affecting patient's functional outcome.   REHAB POTENTIAL: Fair highly symptomatic and long history of chronic low back pain  CLINICAL DECISION MAKING: Evolving/moderate complexity  EVALUATION COMPLEXITY: Moderate   PLAN:  PT FREQUENCY: 2x/week (ideally but may have to modify frequency given copay)  PT DURATION: 3 weeks (anticipate may   PLANNED INTERVENTIONS: 30865- PT Re-evaluation, 97110-Therapeutic exercises,  08657- Therapeutic activity, [id number]- Neuromuscular re-education, [phone]- Self Care, 29528- Manual therapy, and [phone]- Gait training  PLAN FOR NEXT SESSION:  Not planning on addressing back pain as long term chronic issue - may need to refer out, finish vestibular testing and write LTG, holding in [place] Concussion given severity of symptoms at this time  Assess mCTSIB write goals   Westley Foots, PT Westley Foots, PT, DPT, CBIS  12/15/2023, 12:53 PM

## 2023-12-16 ENCOUNTER — Encounter: Payer: Self-pay | Admitting: Family Medicine

## 2023-12-16 NOTE — Progress Notes (Unsigned)
 NEUROLOGY CONSULTATION NOTE  Theresa [name] MRN: [id number] DOB: [birth year]  Referring provider: Seward Carol, NP Primary care provider: Seward Carol, NP  Reason for consult:  migraines  Assessment/Plan:   Migraine without aura, without status migrainosus, intractable Concussion Saddle anesthesia with bowel incontinence - concern for cauda equina syndrome.     STAT MRI of lumbar spine to evaluate for cauda equina syndrome For migraine prevention:  Due to potential of cognitive side effects of topiramate that may complicate her concussion recovery, will decrease dose back to 25mg  daily for now and start Emgality (previously has been on beta blocker and Depakote) For migraine rescue:  Start Maxalt-MLT 10mg , Zofran for nausea. Continue concussion care with PT/OT and Sports Medicine Follow up 4 months.  Total time spent in chart, reviewing imaging and face to face with patient:  79 minutes  Subjective:  Theresa [name] is a 53 year old  right-handed female with chronic back pain, ADD, Bipolar disorder, depression, anxiety and history of bariatric surgery and transaminitis who presents for migraines.  History supplemented by ED, prior neurologist's and referring provider's note.  MRI of brain from 01/2022 and CT head from 3/26 personally reviewed.  Migraines: Onset:  Childhood  Did well on topiramate 25mg  daily for awhile.  Started becoming worse in late 2024. Location:  bilateral frontal, vertex Quality:  pressure Intensity:  severe.   Aura:  none Prodrome:  absent Associated symptoms:  dizziness/spinning, lightheadedness, tinnitus/ringing, head tingling, nausea, vomiting, photophobia, blurred vision/vision impairment due to dizziness.  She denies associated unilateral numbness or weakness. Duration:  few hours to 2-3 days Frequency:  twice a week (2-3 days a week total) Triggers:  unknown Relieving factors:  rest in dark room Activity:  aggravates. Due to worsening  migraines, her PCP increased her topiramate from 25mg  to 50mg  daily in January 2025.   01/24/2022 MRI BRAIN WO:  Scattered T2 hyperintense signal in the periventricular white matter, likely the sequela of mild chronic small vessel ischemic disease.  No acute intracranial process.  Concussion: On 11/29/2023, she had a fall and sustained a concussion.  She has chronic low back pain.  She has Robaxin but doesn't usually take it as it causes drowsiness.  Her back was hurting that night, so she took one.  In the middle of the night, she was told by her husband that she got out of bed to go to the bathroom.  In the bathroom, she began to vomit and fell hitting the front of her head on the counter and back of her head on the toilet.  Her husband found her on the floor unconscious.  No reported seizure activity.  She has no recollection of this.  She declined going to the ED immediately.  However, she began having worsening daily migraines, so she went to the ED on 3/26 for further evaluation.   CT head showed no acute intracranial abnormality.    Since then, she has been having a near constant headache.  She has trouble collecting her thoughts and has difficulty speaking.  She has vertigo.  She has neck pain.  She has been seeing Sports Medicine for treatment of concussion and has been going to OT/speech therapy, PT and vestibular rehab.    She reports that since the fall, she has had saddle anesthesia and has some episodes of bowel incontinence.  When it occurred, she didn't feel the bowel movement.  Denies bilateral leg weakness.  She has history of low back pain and sciatica  but currently denies radicular lumbar pain.  She does have IBS and history of gastric bypass and is followed by gastroenterology.     Past NSAIDS/analgesics:  Ibuprofen 800mg , Excedrin Migraine, naproxen 500mg , Tordadol inj, diclofenac 75mg , Stadol, Midrin Past abortive triptans:  sumatriptan [place]/tab (discontinued due to elevated  pressure) Past abortive ergotamine:  none Past muscle relaxants:  tizanidine Past anti-emetic:  promethazine Past antihypertensive medications:  nebivolol, lisinopril, furosemide Past antidepressant medications:  sertraline, mirtazapine Past anticonvulsant medications: Depakote, lamotrigine Past anti-CGRP:  none Past vitamins/Herbal/Supplements:  none Past antihistamines/decongestants:  Flonase, Benadryl, Zyrtec Other past therapies:  none   Current NSAIDS/analgesics:  meloxicam (back pain) Current triptans:  none Current ergotamine:  none Current anti-emetic:  Zofran 4mg  Current muscle relaxants:  methocarbamol 500mg  PRN Current Antihypertensive medications:  metoprolol succinate ER 25mg , lisinopril 20mg  daily Current Antidepressant medications:  Sertraline 50mg  daily, Wellbutrin SR 150mg  daily Current Anticonvulsant medications:  topiramate 50mg  daily (increased from 25mg  in January ), gabapentin 600mg /1200mg /600mg  Current anti-CGRP:  none Current Vitamins/Herbal/Supplements:  none Current Antihistamines/Decongestants:  none Other therapy:  none Other medications:  Xanax, trazodone 25mg  at bedtime     Caffeine:  none Alcohol:  no Smoker:  no Diet:  six 16 oz bottles of water daily.  No soda.  Tries to avoid wheat and carbs (gastric bypass) Exercise:  walking, works on her farm, gardening Depression:  stable; Anxiety:  yes Other pain:  back pain Sleep hygiene:  cannot sleep in bed because laying her head aggravates pressure on her head, so she sleeps in a recliner.  Poor (4-5 ) Family history of migraines:  mom     PAST MEDICAL HISTORY: Past Medical History:  Diagnosis Date   Anemia    Anxiety    Attention deficit disorder    Bipolar disorder (HCC)    currently no meds- agitation noted during PAT assessment   Depression    GERD (gastroesophageal reflux disease)    resolved   H/O transfusion    17 yrs ago postpartum   Hyperlipidemia    resolved   Hypertension     history of HTN prior to gastric bypass   Hypoglycemia    Left breast abscess 01/22/2017   Sleep apnea, obstructive    cpap used auto settings, resolved gastric bypass    PAST SURGICAL HISTORY: Past Surgical History:  Procedure Laterality Date   ABDOMINAL HYSTERECTOMY     ABDOMINOPLASTY     AUGMENTATION MAMMAPLASTY Bilateral 2016   BIOPSY  02/13/2022   Procedure: BIOPSY;  Surgeon: Dolores Frame, MD;  [facility];  Service: Gastroenterology;;   BREAST BIOPSY Left 01/2017   abscess removed   CESAREAN SECTION     COLON SURGERY     COLONOSCOPY WITH PROPOFOL N/A 07/04/2021   Procedure: COLONOSCOPY WITH PROPOFOL;  Surgeon: Dolores Frame, MD;  [facility];  Service: Gastroenterology;  Laterality: N/A;  2:00 pm   ESOPHAGOGASTRODUODENOSCOPY (EGD) WITH PROPOFOL N/A 02/13/2022   Procedure: ESOPHAGOGASTRODUODENOSCOPY (EGD) WITH PROPOFOL;  Surgeon: Dolores Frame, MD;  [facility];  Service: Gastroenterology;  Laterality: N/A;  815   GASTRIC ROUX-EN-Y N/A 12/06/2013   Procedure: LAPAROSCOPIC ROUX-EN-Y GASTRIC BYPASS WITH UPPER ENDOSCOPY;  Surgeon: Atilano Ina, MD;  Location[facility];  Service: General;  Laterality: N/A;   INCISION AND DRAINAGE ABSCESS Left 01/22/2017   Procedure: INCISION AND DRAINAGE LEFT  BREAST ABSCESS;  Surgeon: Claud Kelp, MD;  Location: [facility];  Service: General;  Laterality: Left;  LAPAROTOMY N/A 02/18/2017   Procedure: LAPAROTOMY;  Surgeon: Richarda Overlie, MD;  Location[facility];  Service: Gynecology;  Laterality: N/A;   POLYPECTOMY  07/04/2021   Procedure: POLYPECTOMY;  Surgeon: Dolores Frame, MD;  [facility];  Service: Gastroenterology;;   SALPINGOOPHORECTOMY Bilateral 02/18/2017   Procedure: BILATERAL SALPINGO OOPHORECTOMY;  Surgeon: Richarda Overlie, MD;  Location[facility];  Service: Gynecology;  Laterality: Bilateral;   TUBAL LIGATION     UNILATERAL  SALPINGECTOMY Right 09/05/2015   Procedure: UNILATERAL SALPINGECTOMY;  Surgeon: Richarda Overlie, MD;  [facility];  Service: Gynecology;  Laterality: Right;   VAGINAL HYSTERECTOMY N/A 09/05/2015   Procedure: HYSTERECTOMY VAGINAL;  Surgeon: Richarda Overlie, MD;  [facility];  Service: Gynecology;  Laterality: N/A;   WISDOM TOOTH EXTRACTION      MEDICATIONS: Current Outpatient Medications on File Prior to Visit  Medication Sig Dispense Refill   ALPRAZolam (XANAX) 0.5 MG tablet Take 0.5 mg by mouth daily as needed for anxiety.     buPROPion (WELLBUTRIN SR) 150 MG 12 hr tablet TAKE 1 TABLET BY MOUTH TWICE A DAY (Patient taking differently: Take 150 mg by mouth daily.) 180 tablet 1   diclofenac (VOLTAREN) 75 MG EC tablet Take 1 tablet (75 mg total) by mouth 2 (two) times daily. 60 tablet 0   dicyclomine (BENTYL) 20 MG tablet Take 1/2 tablet (10 mg total) by mouth every 12 (twelve) hours as needed (abdominal pain). 90 tablet 1   esomeprazole (NEXIUM) 40 MG capsule Take 1 capsule (40 mg total) by mouth 2 (two) times daily before a meal. 30 capsule 11   estradiol (ESTRACE) 1 MG tablet Take 1 tablet (1 mg total) by mouth 2 (two) times daily. 180 tablet 0   gabapentin (NEURONTIN) 600 MG tablet TAKE 1 TABLET BY MOUTH IN THE MORNING TAKE 1 TABLET IN THE EVENING AND TAKE 2 TABLETS AT BEDTIME (Patient taking differently: Take 600-1,200 mg by mouth See admin instructions. TAKE  600 MG BY MOUTH IN THE MORNING AND AT BEDTIME, 1200 MG AT LUNCHTIME) 120 tablet 3   ibuprofen (ADVIL) 800 MG tablet Take 1 tablet (800 mg total) by mouth every 8 (eight) hours as needed. 90 tablet 1   lisinopril (ZESTRIL) 20 MG tablet Take 20 mg by mouth daily.     lubiprostone (AMITIZA) 24 MCG capsule Take 1 capsule (24 mcg total) by mouth 2 (two) times daily with a meal. 180 capsule 3   methocarbamol (ROBAXIN) 500 MG tablet Take 1 tablet (500 mg total) by mouth 3 (three) times daily. 60 tablet 1   metoprolol succinate  (TOPROL-XL) 25 MG 24 hr tablet TAKE 1 TABLET BY MOUTH EVERY DAY 90 tablet 0   ondansetron (ZOFRAN) 4 MG tablet Take 1 tablet (4 mg total) by mouth every 8 (eight) hours as needed for nausea or vomiting. 90 tablet 1   ondansetron (ZOFRAN-ODT) 4 MG disintegrating tablet Take 1 tablet (4 mg total) by mouth every 8 (eight) hours as needed for nausea or vomiting. 20 tablet 0   sertraline (ZOLOFT) 50 MG tablet Take 50 mg by mouth daily.     sodium chloride (MURO 128) 2 % ophthalmic solution Place 1 drop into both eyes in the morning, at noon, and at bedtime.     topiramate (TOPAMAX) 25 MG tablet Take by mouth.     traZODone (DESYREL) 100 MG tablet Take 50 mg by mouth at bedtime.     No current facility-administered medications on file prior to visit.  ALLERGIES: Allergies  Allergen Reactions   Other Hives    Needs benadryl when given narcotics    FAMILY HISTORY: Family History  Problem Relation Age of Onset   Hypertension Mother    Cancer Mother 31       breast   Diabetes Mother    Hyperlipidemia Mother    Thyroid disease Mother    Breast cancer Mother 18   Depression Mother    Hypertension Father    Diabetes Father    Heart disease Father    Stroke Father    Depression Father    Bipolar disorder Father    Cancer Maternal Grandmother        Breast   Hyperlipidemia Maternal Grandmother    Heart disease Maternal Grandmother    Stroke Maternal Grandmother    Hypertension Maternal Grandmother    Breast cancer Maternal Grandmother 85   Hyperlipidemia Maternal Grandfather    Hypertension Maternal Grandfather    Cancer Paternal Grandmother        Colon   Hyperlipidemia Paternal Grandmother    Heart disease Paternal Grandmother    Hyperlipidemia Paternal Grandfather    Hypertension Paternal Grandfather    Breast cancer Paternal Aunt     Objective:  Blood pressure 138/81, pulse 63, weight 160 lb (72.6 kg), SpO2 100%. General: No acute distress.  Patient appears well-groomed.    Head:  Normocephalic/atraumatic Eyes:  fundi examined but not visualized Neck: supple, no paraspinal tenderness, full range of motion Back: No paraspinal tenderness Heart: regular rate and rhythm Neurological Exam: Mental status: alert and oriented to person, place, and time, speech fluent and not dysarthric, language intact. Cranial nerves: CN I: not tested CN II: pupils equal, round and reactive to light, visual fields intact CN III, IV, VI:  full range of motion, no nystagmus, no ptosis CN V: facial sensation intact. CN VII: upper and lower face symmetric CN VIII: hearing intact CN IX, X: gag intact, uvula midline CN XI: sternocleidomastoid and trapezius muscles intact CN XII: tongue midline Bulk & Tone: normal, no fasciculations. Motor:  muscle strength 5/5 throughout Sensation:  Pinprick sensation reduced over dorsum of feet and anterior legs up to knees; vibratory sensation intact. Deep Tendon Reflexes:  2+ throughout,  toes downgoing.   Finger to nose testing:  Without dysmetria.   Heel to shin:  Without dysmetria.   Gait:  Normal station and stride.  Slow tandem walking.  Romberg negative.    Thank you for allowing me to take part in the care of this patient.  Shon Millet, DO  CC: Seward Carol, NP

## 2023-12-17 ENCOUNTER — Encounter: Payer: Self-pay | Admitting: Neurology

## 2023-12-17 ENCOUNTER — Ambulatory Visit: Admitting: Speech Pathology

## 2023-12-17 ENCOUNTER — Ambulatory Visit: Payer: Self-pay | Admitting: Neurology

## 2023-12-17 ENCOUNTER — Ambulatory Visit (HOSPITAL_COMMUNITY)
Admission: RE | Admit: 2023-12-17 | Discharge: 2023-12-17 | Disposition: A | Discharge status: Home / Self Care | Source: Ambulatory Visit | Attending: Neurology | Admitting: Neurology

## 2023-12-17 VITALS — BP 138/81 | HR 63 | Wt 160.0 lb

## 2023-12-17 DIAGNOSIS — R159 Full incontinence of feces: Secondary | ICD-10-CM | POA: Diagnosis present

## 2023-12-17 DIAGNOSIS — R200 Anesthesia of skin: Secondary | ICD-10-CM | POA: Diagnosis not present

## 2023-12-17 DIAGNOSIS — S060X1A Concussion with loss of consciousness of 30 minutes or less, initial encounter: Secondary | ICD-10-CM

## 2023-12-17 DIAGNOSIS — G43019 Migraine without aura, intractable, without status migrainosus: Secondary | ICD-10-CM

## 2023-12-17 MED ORDER — TOPIRAMATE 25 MG PO TABS: 25.0000 mg | ORAL_TABLET | Freq: Every day | ORAL | 5 refills | Status: AC

## 2023-12-17 MED ORDER — EMGALITY 120 MG/ML ~~LOC~~ SOAJ: 240.0000 mg | Freq: Once | SUBCUTANEOUS | 0 refills | Status: AC

## 2023-12-17 MED ORDER — RIZATRIPTAN BENZOATE 10 MG PO TBDP: 10.0000 mg | ORAL_TABLET | ORAL | 5 refills | Status: AC | PRN

## 2023-12-17 NOTE — Patient Instructions (Addendum)
  MRI of lumbar spine without contrast ASAP Decrease topiramate back to 25mg  at bedtime. Start Emgality - 2 injections for first dose, then 1 injection every 28 days thereafter.  When you pick up first dose, make sure you have 2 pens.  Then contact me that you received them and I will send in prescription for standing order Take rizatriptan at earliest onset.  May repeat after 2 hours.  Maximum 2 tablets in 24 hours. Limit use of pain relievers to no more than 9 days out of month to prevent risk of rebound or medication-overuse headache. Continue PT/OT and management with Sports Medicine Follow up 4 months.

## 2023-12-17 NOTE — Progress Notes (Signed)
 STAT MRI Lumbar spine PA started, NO PA needed per Rep Marily Memos at [facility] Ref# [id number].

## 2023-12-18 ENCOUNTER — Encounter: Payer: Self-pay | Admitting: Neurology

## 2023-12-18 ENCOUNTER — Telehealth: Payer: Self-pay

## 2023-12-18 DIAGNOSIS — R159 Full incontinence of feces: Secondary | ICD-10-CM

## 2023-12-18 DIAGNOSIS — R29898 Other symptoms and signs involving the musculoskeletal system: Secondary | ICD-10-CM

## 2023-12-18 NOTE — Progress Notes (Signed)
 LMOVM for patient to call the office back.

## 2023-12-18 NOTE — Telephone Encounter (Signed)
-----   Message from Cira Servant sent at 12/18/2023  7:43 AM EDT ----- MRI of the lumbar spine shows arthritis but nothing pressing on the nerves that would be causing symptoms or warranting surgery.  For further evaluation of bowel dysfunction and bilateral leg numbness with possible neck injury, would like to get MRI of cervical spine without contrast

## 2023-12-18 NOTE — Telephone Encounter (Signed)
 Patient advised.   MRI Cervical spine ordered.

## 2023-12-19 ENCOUNTER — Ambulatory Visit
Admission: RE | Admit: 2023-12-19 | Discharge: 2023-12-19 | Disposition: A | Discharge status: Home / Self Care | Source: Ambulatory Visit | Attending: Neurology | Admitting: Neurology

## 2023-12-19 ENCOUNTER — Ambulatory Visit

## 2023-12-19 DIAGNOSIS — R159 Full incontinence of feces: Secondary | ICD-10-CM

## 2023-12-19 DIAGNOSIS — M542 Cervicalgia: Secondary | ICD-10-CM

## 2023-12-19 DIAGNOSIS — R42 Dizziness and giddiness: Secondary | ICD-10-CM | POA: Diagnosis not present

## 2023-12-19 DIAGNOSIS — M6281 Muscle weakness (generalized): Secondary | ICD-10-CM

## 2023-12-19 DIAGNOSIS — R278 Other lack of coordination: Secondary | ICD-10-CM

## 2023-12-19 DIAGNOSIS — R2681 Unsteadiness on feet: Secondary | ICD-10-CM

## 2023-12-19 DIAGNOSIS — R29898 Other symptoms and signs involving the musculoskeletal system: Secondary | ICD-10-CM
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
## 2023-12-22 ENCOUNTER — Ambulatory Visit: Admitting: Occupational Therapy

## 2023-12-22 ENCOUNTER — Encounter: Payer: Self-pay | Admitting: Neurology

## 2023-12-22 ENCOUNTER — Encounter: Payer: Self-pay | Admitting: Physical Therapy

## 2023-12-22 ENCOUNTER — Telehealth: Payer: Self-pay

## 2023-12-22 ENCOUNTER — Telehealth: Payer: Self-pay | Admitting: Pharmacy Technician

## 2023-12-22 ENCOUNTER — Encounter: Payer: Self-pay | Admitting: Family Medicine

## 2023-12-22 ENCOUNTER — Ambulatory Visit: Admitting: Physical Therapy

## 2023-12-22 ENCOUNTER — Other Ambulatory Visit (HOSPITAL_COMMUNITY): Payer: Self-pay

## 2023-12-22 VITALS — BP 136/86 | HR 64

## 2023-12-22 DIAGNOSIS — R42 Dizziness and giddiness: Secondary | ICD-10-CM

## 2023-12-22 DIAGNOSIS — R29818 Other symptoms and signs involving the nervous system: Secondary | ICD-10-CM

## 2023-12-22 DIAGNOSIS — M6281 Muscle weakness (generalized): Secondary | ICD-10-CM

## 2023-12-22 DIAGNOSIS — R41840 Attention and concentration deficit: Secondary | ICD-10-CM

## 2023-12-22 DIAGNOSIS — R2681 Unsteadiness on feet: Secondary | ICD-10-CM

## 2023-12-22 DIAGNOSIS — R278 Other lack of coordination: Secondary | ICD-10-CM

## 2023-12-22 DIAGNOSIS — M542 Cervicalgia: Secondary | ICD-10-CM

## 2023-12-22 MED ORDER — EMGALITY 120 MG/ML ~~LOC~~ SOAJ: 120.0000 mg | SUBCUTANEOUS | 2 refills | Status: AC

## 2023-12-22 NOTE — Telephone Encounter (Signed)
 Plan allows 1 per 28 days. PA has been submitted for 3/84 to see if they will approve it.  PA has been submitted, and telephone encounter has been created. Please see telephone encounter dated 4.14.25.

## 2023-12-22 NOTE — Patient Instructions (Addendum)
 Theresa [name]

## 2023-12-22 NOTE — Therapy (Signed)
 OUTPATIENT OCCUPATIONAL THERAPY NEURO Treatment  Patient Name: Theresa [name] MRN: [id number] DOB:November 08, 1970, 53 y.o., female Today's Date: 12/22/2023  PCP: Ezell Hollow, NP  REFERRING PROVIDER: Rodgers Clack, DO   END OF SESSION:  OT End of Session - 12/22/23 1021     Visit Number 2    Number of Visits 7   including eval   Date for OT Re-Evaluation 01/16/24    Authorization Type [employer] 2025    OT Start Time [phone]    OT Stop Time 1016    OT Time Calculation (min) 40 min    Activity Tolerance No increased pain;Other (comment);Patient tolerated treatment well   pt reported symptoms of nausea and headache   Behavior During Therapy WFL for tasks assessed/performed              Past Medical History:  Diagnosis Date   Anemia    Anxiety    Attention deficit disorder    Bipolar disorder (HCC)    currently no meds- agitation noted during PAT assessment   Depression    GERD (gastroesophageal reflux disease)    resolved   H/O transfusion    17 yrs ago postpartum   Hyperlipidemia    resolved   Hypertension    history of HTN prior to gastric bypass   Hypoglycemia    Left breast abscess 01/22/2017   Sleep apnea, obstructive    cpap used auto settings, resolved gastric bypass   Past Surgical History:  Procedure Laterality Date   ABDOMINAL HYSTERECTOMY     ABDOMINOPLASTY     AUGMENTATION MAMMAPLASTY Bilateral 2016   BIOPSY  02/13/2022   Procedure: BIOPSY;  Surgeon: Urban Garden, MD;  Location: [facility];  Service: Gastroenterology;;   BREAST BIOPSY Left 01/2017   abscess removed   CESAREAN SECTION     COLON SURGERY     COLONOSCOPY WITH PROPOFOL N/A 07/04/2021   Procedure: COLONOSCOPY WITH PROPOFOL;  Surgeon: Urban Garden, MD;  Location: [facility];  Service: Gastroenterology;  Laterality: N/A;  2:00 pm   ESOPHAGOGASTRODUODENOSCOPY (EGD) WITH PROPOFOL N/A 02/13/2022   Procedure: ESOPHAGOGASTRODUODENOSCOPY (EGD) WITH PROPOFOL;   Surgeon: Urban Garden, MD;  Location: [facility];  Service: Gastroenterology;  Laterality: N/A;  815   GASTRIC ROUX-EN-Y N/A 12/06/2013   Procedure: LAPAROSCOPIC ROUX-EN-Y GASTRIC BYPASS WITH UPPER ENDOSCOPY;  Surgeon: Fran Imus, MD;  Location: [facility];  Service: General;  Laterality: N/A;   INCISION AND DRAINAGE ABSCESS Left 01/22/2017   Procedure: INCISION AND DRAINAGE LEFT  BREAST ABSCESS;  Surgeon: Boyce Byes, MD;  Location: [facility];  Service: General;  Laterality: Left;   LAPAROTOMY N/A 02/18/2017   Procedure: LAPAROTOMY;  Surgeon: Woodrow Hazy, MD;  Location: [facility];  Service: Gynecology;  Laterality: N/A;   POLYPECTOMY  07/04/2021   Procedure: POLYPECTOMY;  Surgeon: Urban Garden, MD;  Location: [facility];  Service: Gastroenterology;;   SALPINGOOPHORECTOMY Bilateral 02/18/2017   Procedure: BILATERAL SALPINGO OOPHORECTOMY;  Surgeon: Woodrow Hazy, MD;  Location: [facility];  Service: Gynecology;  Laterality: Bilateral;   TUBAL LIGATION     UNILATERAL SALPINGECTOMY Right 09/05/2015   Procedure: UNILATERAL SALPINGECTOMY;  Surgeon: Woodrow Hazy, MD;  Location: [facility];  Service: Gynecology;  Laterality: Right;   VAGINAL HYSTERECTOMY N/A 09/05/2015   Procedure: HYSTERECTOMY VAGINAL;  Surgeon: Woodrow Hazy, MD;  Location: [facility];  Service: Gynecology;  Laterality: N/A;   WISDOM TOOTH EXTRACTION     Patient Active Problem  List   Diagnosis Date Noted   Barrett's esophagus without dysplasia 04/25/2023   Heartburn 04/21/2023   Drug-induced liver injury 04/25/2022   IBS (irritable colon syndrome) 04/25/2022   Nausea with vomiting 12/24/2021   Lymphadenopathy 12/08/2019   Left breast lump 12/08/2019   Insomnia 11/09/2019   History of benign adrenal tumor 01/08/2019   Abdominal pain, chronic, left lower quadrant (Primary Area of Pain) 07/27/2018   Chronic left-sided low back pain (Secondary Area of Pain) 07/27/2018   Chronic  pain syndrome 07/27/2018   Pharmacologic therapy 07/27/2018   Disorder of skeletal system 07/27/2018   Problems influencing health status 07/27/2018   Hypoglycemia after GI (gastrointestinal) surgery 04/10/2018   Estrogen deficiency 07/01/2017   Lower abdominal pain 06/03/2017   Alcohol use    Bipolar affect, depressed (HCC) 03/05/2017   Chronic post-traumatic stress disorder (PTSD) 03/05/2017   Bipolar affective disorder, current episode mixed (HCC) 03/05/2017   Neuropathic pain 02/18/2017   Adenomyosis 09/05/2015   Anemia 04/20/2015   Hyperglycemia 08/18/2014   S/P gastric bypass 12/06/2013   GERD (gastroesophageal reflux disease) 10/13/2013   Essential hypertension 10/02/2013   Hyperlipidemia 01/05/2013    ONSET DATE: 12/09/23 (referral date)  REFERRING DIAG:  S06.0X9A (ICD-10-CM) - Concussion with loss of consciousness, initial encounter  F07.81 (ICD-10-CM) - Postconcussive syndrome   Per 12/09/23 referral notes: "Post concussion therapy-visual spacial deficits"   THERAPY DIAG:  Other lack of coordination  Muscle weakness (generalized)  Other symptoms and signs involving the nervous system  Attention and concentration deficit  Rationale for Evaluation and Treatment: Rehabilitation  SUBJECTIVE:   SUBJECTIVE STATEMENT: Pt reported "better day today." However, pt reported bad headache today.  Pt mentioned potentially going back to work soon but uncertain d/t ongoing symptoms. Pt reported 10/10 level of nausea currently. Pt reported difficulty with sleep.  Pt accompanied by: self - pt drove self  PERTINENT HISTORY: PTSD, bipolar disorder, chronic back pain and pain disorder   CT Head wo contrast 12/03/2023: IMPRESSION: 1. No evidence of acute intracranial abnormality. 2. Left-sided sinusitis.  Per 12/11/23 PT Evaluation Note: "Patient reports that she fell and hit her face on the counter and back of head on toilet with loss of consciousness for about a minute or two;  she was on a muscle relaxor for back pain at time of injury. Patient was also throwing up after the injury. She had the accident on Saturday, March 21-22nd. Patient reports since the accident she has had really bad headaches, light sensitivity, twitching eye, difficulty getting words out, balance, and pain in head and neck as well as low back. Patient denies previous concussions. Patient reports that her headaches are constant since accident but are really bad following activity and last for several hours. Patient is not currently on any medications for headaches to her knowledge.   PRECAUTIONS: FALL  WEIGHT BEARING RESTRICTIONS: No  PAIN:  Are you having pain? 8-9/10 headache  FALLS: Has patient fallen in last 6 months? Yes. 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  HAND DOMINANCE: Right  ADLs: Overall ADLs:  Transfers/ambulation related to ADLs: Eating: ind, no difficulty. Pt reported reduced appetite since the injury. Grooming: ind, difficulty grasping toothbrush "when my hands stiffen up" - toothbrush sometimes fall out of pt's mouth/hand, "I make a mess." UB Dressing: ind, stiffness near neck so some difficulty with lifting arm  "it feels like a sharp pinch, not painful." Pt has not attempted buttons/zippers. LB Dressing: ind, difficult d/t low back pain. Completes task sitting Toileting: ind Bathing: ind, standing though has shower chair available in the shower if pt gets dizzy, walk-in  shower  Tub Shower transfers: no difficulty Equipment: Walk in shower  IADLs: Shopping: family assists, pt previously completed grocery shopping Light housekeeping: Pt reported "some of it I'm okay" (washing dishes, wiping counters, folding laundry) though family assists with sweeping and transferring clothes for laundry (pt experiences dizziness when bending at waist) Meal Prep: "I haven't really been eating." Pt reported last eating last night around 6 PM. Pt reported drinking water for hydration.  Community mobility: drove self Medication management: family assists for set-up, uses medication organizer  Handwriting:  Pt wrote name and simple sentence with 100% legibility. No concerns noted. Pt sometimes stretched hand likely d/t stiffness. Pt reported "it hurts" across hand when holding pen.  Occupation: Middle Engineer, site. Difficulty with picking up objects, pt reported preference to work on "more like FM skills without it hurting so bad"   MOBILITY STATUS:  ind  POSTURE COMMENTS:  Sitting balance ind  ACTIVITY TOLERANCE: Activity tolerance: Pt reported nausea and dizziness if working on a task.  Pt reported nausea currently.  FUNCTIONAL OUTCOME MEASURES: PSFS: 3.6    Total score = sum of the activity scores/number of activities Minimum detectable change (90%CI) for average score = 2 points Minimum detectable change (90%CI) for single activity score = 3 points   UPPER EXTREMITY ROM:    Active ROM Right eval Left eval  Shoulder flexion Slightly less AROM on R side d/t neck pain, though [facility]  Shoulder abduction    Shoulder adduction    Shoulder extension    Shoulder internal rotation    Shoulder external rotation    Elbow flexion [facility] WFL  Elbow extension    Wrist flexion    Wrist extension    Wrist ulnar deviation    Wrist radial deviation    Wrist pronation    Wrist supination    (Blank rows = not tested)  BUE Composite digit flex/ext - WFL  UPPER  EXTREMITY MMT:     MMT Right eval Left eval  Shoulder flexion    Shoulder abduction    Shoulder adduction    Shoulder extension    Shoulder internal rotation    Shoulder external rotation    Middle trapezius    Lower trapezius    Elbow flexion    Elbow extension    Wrist flexion    Wrist extension    Wrist ulnar deviation    Wrist radial deviation    Wrist pronation    Wrist supination    (Blank rows = not tested)  HAND FUNCTION: Grip strength: Right: 39.4, 24.9, 25.7 (30 lbs average) lbs; Left: 39.4, 44.3, 47.6 (43.7 lbs average)  lbs  COORDINATION:  9-hole peg test: Right: 45 lbs; Left: 41 lbs  RUE: Pt demo'd difficulty with initial grasp of item. Pt paused to stretch out fingers 2x near end of task. Pt reported 7/10 pain level. Pt reported "the  more I tried using it, the more it hurts in wrist."  LUE: Pt paused to stretch out fingers 5x. Pt reported pain of hand and pointed out over thenar eminence. Pt reported 4/10 pain level.  SENSATION: Pt denied tingling/numbness in B arm/shoulder/hands. Pt reported tingling in head "all the time" 10/10.   12/22/23 update - OT noted pt had several small cuts on B hands with bandages covering some cuts. Pt reported accidentally injuring hands when caring for animals. Pt reported she was not aware that the small cuts occurred at the time though notices the cuts now. OT questioning ?potential sensory involvement at BUE.  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 Pt reported needing to wear glasses all the time. Pt reported glasses help though pt reported difficulty tolerating pressure of glasses and pointed out under eye the glasses "doesn't even touch but it hurts  here." Pt reported pain/pressure decreases when removing glasses.  Baseline vision: Wears glasses all the time Visual history: None noted  VISION ASSESSMENT: Not tested  PERCEPTION: Not tested  PRAXIS: Not tested  OBSERVATIONS: Pt was pleasant. Pt ambulated without A/E. When seated and not participating in conversation or a task, pt sometimes closed eyes and held her head in her hands. Pt benefited from low-light environment d/t headache pain. Pt often stretched out hands briefly during functional tasks involving grasp secondary to B hand pain based on pt report.                                                                                                                             TREATMENT DATE:    Self-Care OT and pt discussed return-to-work safety and considerations. OT advised pt to f/u with doctor prior to return (Note: Pt has doctor appointment on Wednesday 12/24/23). OT recommended options to potentially postpone return-to-work until after spring break, discuss with supervisor about concerns and potential supports needed prior to first day back, and consider reduced work day and gradually increase as tolerated. Pt verbalized understanding.   OT provided extensive education to pt on sleep hygiene and energy conservation. Handouts provided, see pt instructions. Pt verbalized understanding.  Deep breathing strategies - to reduce stress and for energy conservation. Handout provided, see pt instructions. Pt returned demo.  OT noted pt had several small cuts on B hands with bandages covering some cuts. Pt reported accidentally injuring hands when caring for animals. Pt reported she was not aware that the small cuts occurred at the time though notices the cuts now. Therefore, OT educated pt on Sensory safety precautions to improve safety and prevent injury to B hands. OT questioning ?potential sensory involvement at BUE. Handout provided, see pt instructions. Pt acknowledged understanding.  TherAct OT initiated tendon glides HEP - to improve BUE coordination and dexterity, to reduce pain and stiffness. Handout provided, see pt instructions. Pt returned demo though demo'd increased FM difficulty with RUE compared to LUE. Pt reported pain of RUE typically though reported exercises "actually feels good."   PATIENT EDUCATION: Education details: see today's tx above Person educated: Patient Education method: Explanation Education comprehension: verbalized understanding  HOME EXERCISE PROGRAM: 12/22/23 - tendon glides, sleep hygiene, energy conservation, sensory safety precautions (see pt instructions)   GOALS: Goals reviewed with patient? Yes  SHORT TERM GOALS: Target date: 01/02/24  Pt will be ind with initial HEP using visual handouts. Baseline: new to outpt OT 12/22/23 - tendon glides issued, pt returned demo. Goal status: in progress  2.  Pt will ind recall at least 3 energy conservation strategies. Baseline: pt reported fatigue, symptoms of nausea and dizziness when completing functional tasks Goal status: in progress  3.  Pt will demo understanding of adaptive strategies and A/E to reduce pain and improve efficiency with ADLs/IADLs. Baseline: Pt reported difficulty with UB/LB dressing d/t pain, difficulty with sweeping and laundry d/t symptoms of nausea and dizziness, no appetite for food and not eating as often. Goal status: INITIAL  4.  Pt will recall at least 3 strategies to reduce pain/stiffness of BUE. Baseline: Pt reported pain/stiffness of B hands during functional tasks. Goal status: in progress  LONG TERM GOALS: Target date: 01/16/24  Patient will report at least two-point increase in average PSFS score or at least three-point increase in a single activity score indicating functionally significant improvement given minimum detectable change. Baseline: PSFS: 3.6 total score (See above for individual activity scores)  Goal status: INITIAL  2.  Patient  will maintain at least 30 lbs RUE grip strength across all 3 trials as needed to grasp toothbrush and writing utensils. Baseline: Grip strength: Right: 39.4, 24.9, 25.7 (30 lbs average) lbs; Left: 39.4, 44.3, 47.6 (43.7 lbs average)  lbs Goal status: INITIAL  3.  Patient will demo improved FM coordination as evidenced by completing nine-hole peg with use of RUE in 40 seconds or less.  Baseline: 9-hole peg test: Right: 45 lbs; Left: 41 lbs Goal status: INITIAL  ASSESSMENT:  CLINICAL IMPRESSION: Pt reported high levels of nausea and headache pain today. However, pt was able to participate in education and returned demo of initial HEP tendon glides. Pt demo'd good understanding of education though may benefit from review of energy conservation strategies. Pt considering return-to-work soon. OT to monitor regarding return-to-work considerations if pt chooses to return to work. Pt has f/u appointment with doctor on 12/24/23 and OT recommended to pt to speak with doctor about return-to-work. Pt acknowledged understanding. Pt would benefit from skilled OT services in the outpatient setting to work on impairments as noted below.  PERFORMANCE DEFICITS: in functional skills including ADLs, IADLs, coordination, dexterity, proprioception, sensation, ROM, strength, pain, flexibility, Fine motor control, Gross motor control, mobility, body mechanics, endurance, vision, and UE functional use, cognitive skills including attention and energy/drive, and psychosocial skills including environmental adaptation.   IMPAIRMENTS: are limiting patient from ADLs, IADLs, rest and sleep, work, leisure, and social participation.   CO-MORBIDITIES: may have co-morbidities  that affects occupational performance. Patient will benefit from skilled OT to address above impairments and improve overall function.  MODIFICATION OR ASSISTANCE TO COMPLETE EVALUATION: Min-Moderate modification of tasks or assist with assess necessary to  complete an evaluation.  OT OCCUPATIONAL PROFILE AND HISTORY: Detailed assessment: Review of records  and additional review of physical, cognitive, psychosocial history related to current functional performance.  CLINICAL DECISION MAKING: Moderate - several treatment options, min-mod task modification necessary  REHAB POTENTIAL: Good  EVALUATION COMPLEXITY: Moderate    PLAN:  OT FREQUENCY: 1-2x/week  OT DURATION: 3 weeks   PLANNED INTERVENTIONS: 97168 OT Re-evaluation, 97535 self care/ADL training, 40981 therapeutic exercise, 97530 therapeutic activity, 97112 neuromuscular re-education, 97140 manual therapy, 97035 ultrasound, 97018 paraffin, 19147 fluidotherapy, 97010 moist heat, 97010 cryotherapy, passive range of motion, functional mobility training, visual/perceptual remediation/compensation, energy conservation, patient/family education, and DME and/or AE instructions  RECOMMENDED OTHER SERVICES: PT eval completed, SLP eval scheduled  CONSULTED AND AGREED WITH PLAN OF CARE: Patient  PLAN FOR NEXT SESSION:   Review PRN: Tendon glides, energy conservation Monitor sensation of BUE INITIATE: FM coordination, and theraputty A/E and adaptive strategies for laundry, sweeping, dressing, built-up handles for toothbrush/pen, reacher for picking up items from floor   Oakley Bellman, OT 12/22/2023, 10:38 AM

## 2023-12-22 NOTE — Telephone Encounter (Signed)
 Pharmacy Patient Advocate Encounter  Received notification from [facility] that Prior Authorization for [facility]MG  has been CANCELLED due to    INSURANCE WILL ONLY ALLOW 1 PER 28 DAYS

## 2023-12-22 NOTE — Telephone Encounter (Signed)
 Pharmacy Patient Advocate Encounter   Received notification from Pt Calls Messages that prior authorization for [facility]MG  is required/requested.   Insurance verification completed.   The patient is insured through [facility] .   Per test claim: PA required; PA submitted to above mentioned insurance via CoverMyMeds Key/confirmation #/EOC [id number] Status is pending

## 2023-12-22 NOTE — Telephone Encounter (Signed)
 Patient would like a 90 day supply of Emg[employer].  PA maybe needed.

## 2023-12-22 NOTE — Therapy (Signed)
 OUTPATIENT PHYSICAL THERAPY VESTIBULAR TREATMENT  Patient Name: Theresa [name] MRN: [id number] DOB:1971-01-15, 53 y.o., female Today's Date: 12/22/2023  END OF SESSION:  PT End of Session - 12/22/23 0831     Visit Number 4    Number of Visits 7    Date for PT Re-Evaluation 01/15/24    Authorization Type [employer]    PT Start Time 0830    PT Stop Time 0915    PT Time Calculation (min) 45 min    Equipment Utilized During Treatment Gait belt    Activity Tolerance Patient tolerated treatment well    Behavior During Therapy WFL for tasks assessed/performed             Past Medical History:  Diagnosis Date   Anemia    Anxiety    Attention deficit disorder    Bipolar disorder (HCC)    currently no meds- agitation noted during PAT assessment   Depression    GERD (gastroesophageal reflux disease)    resolved   H/O transfusion    17 yrs ago postpartum   Hyperlipidemia    resolved   Hypertension    history of HTN prior to gastric bypass   Hypoglycemia    Left breast abscess 01/22/2017   Sleep apnea, obstructive    cpap used auto settings, resolved gastric bypass   Past Surgical History:  Procedure Laterality Date   ABDOMINAL HYSTERECTOMY     ABDOMINOPLASTY     AUGMENTATION MAMMAPLASTY Bilateral 2016   BIOPSY  02/13/2022   Procedure: BIOPSY;  Surgeon: Dolores Frame, MD;  [facility]: [facility];  Service: Gastroenterology;;   BREAST BIOPSY Left 01/2017   abscess removed   CESAREAN SECTION     COLON SURGERY     COLONOSCOPY WITH PROPOFOL N/A 07/04/2021   Procedure: COLONOSCOPY WITH PROPOFOL;  Surgeon: Dolores Frame, MD;  [facility]: [facility];  Service: Gastroenterology;  Laterality: N/A;  2:00 pm   ESOPHAGOGASTRODUODENOSCOPY (EGD) WITH PROPOFOL N/A 02/13/2022   Procedure: ESOPHAGOGASTRODUODENOSCOPY (EGD) WITH PROPOFOL;  Surgeon: Dolores Frame, MD;  [facility]: [facility];  Service: Gastroenterology;  Laterality: N/A;  815   GASTRIC  ROUX-EN-Y N/A 12/06/2013   Procedure: LAPAROSCOPIC ROUX-EN-Y GASTRIC BYPASS WITH UPPER ENDOSCOPY;  Surgeon: Atilano Ina, MD;  Location: [facility];  Service: General;  Laterality: N/A;   INCISION AND DRAINAGE ABSCESS Left 01/22/2017   Procedure: INCISION AND DRAINAGE LEFT  BREAST ABSCESS;  Surgeon: Claud Kelp, MD;  Location: [facility];  Service: General;  Laterality: Left;   LAPAROTOMY N/A 02/18/2017   Procedure: LAPAROTOMY;  Surgeon: Richarda Overlie, MD;  Location: [facility];  Service: Gynecology;  Laterality: N/A;   POLYPECTOMY  07/04/2021   Procedure: POLYPECTOMY;  Surgeon: Dolores Frame, MD;  [facility]: [facility];  Service: Gastroenterology;;   SALPINGOOPHORECTOMY Bilateral 02/18/2017   Procedure: BILATERAL SALPINGO OOPHORECTOMY;  Surgeon: Richarda Overlie, MD;  Location: [facility];  Service: Gynecology;  Laterality: Bilateral;   TUBAL LIGATION     UNILATERAL SALPINGECTOMY Right 09/05/2015   Procedure: UNILATERAL SALPINGECTOMY;  Surgeon: Richarda Overlie, MD;  Location: [facility];  Service: Gynecology;  Laterality: Right;   VAGINAL HYSTERECTOMY N/A 09/05/2015   Procedure: HYSTERECTOMY VAGINAL;  Surgeon: Richarda Overlie, MD;  Location: [facility];  Service: Gynecology;  Laterality: N/A;   WISDOM TOOTH EXTRACTION     Patient Active Problem List   Diagnosis Date Noted   Barrett's esophagus without dysplasia 04/25/2023   Heartburn 04/21/2023   Drug-induced liver injury  04/25/2022   IBS (irritable colon syndrome) 04/25/2022   Nausea with vomiting 12/24/2021   Lymphadenopathy 12/08/2019   Left breast lump 12/08/2019   Insomnia 11/09/2019   History of benign adrenal tumor 01/08/2019   Abdominal pain, chronic, left lower quadrant (Primary Area of Pain) 07/27/2018   Chronic left-sided low back pain (Secondary Area of Pain) 07/27/2018   Chronic pain syndrome 07/27/2018   Pharmacologic therapy 07/27/2018   Disorder of skeletal system 07/27/2018   Problems influencing  health status 07/27/2018   Hypoglycemia after GI (gastrointestinal) surgery 04/10/2018   Estrogen deficiency 07/01/2017   Lower abdominal pain 06/03/2017   Alcohol use    Bipolar affect, depressed (HCC) 03/05/2017   Chronic post-traumatic stress disorder (PTSD) 03/05/2017   Bipolar affective disorder, current episode mixed (HCC) 03/05/2017   Neuropathic pain 02/18/2017   Adenomyosis 09/05/2015   Anemia 04/20/2015   Hyperglycemia 08/18/2014   S/P gastric bypass 12/06/2013   GERD (gastroesophageal reflux disease) 10/13/2013   Essential hypertension 10/02/2013   Hyperlipidemia 01/05/2013    PCP: Karl Bales, NP REFERRING PROVIDER: Andi Devon, DO  REFERRING DIAG: S06.0X9A (ICD-10-CM) - Concussion with loss of consciousness, initial encounter F07.81 (ICD-10-CM) - Postconcussive syndrome  THERAPY DIAG:  Dizziness and giddiness  Cervicalgia  Muscle weakness (generalized)  Unsteadiness on feet  ONSET DATE: 12/09/2023 (referral date)  Rationale for Evaluation and Treatment: Rehabilitation  SUBJECTIVE:   SUBJECTIVE STATEMENT: Patient reports that she is doing much better. She reports that today is a much better day. She feels like her stuttering has been way better. She has been working on her exercises and bending over has been the most challenging. She has been working on her exercises every day.   Pt accompanied by: self - drove self  PERTINENT HISTORY: PTSD, bipolar disorder, chronic back pain and pain disorder  PAIN:  Are you having pain? Yes: NPRS scale: 7-8/10 Pain location: HA Pain description: "tingly"  PRECAUTIONS: Fall  PATIENT GOALS: "To be able to move without pain."   OBJECTIVE:  Note: Objective measures were completed at Evaluation unless otherwise noted.  DIAGNOSTIC FINDINGS:   CT Head wo contrast 12/03/2023: IMPRESSION: 1. No evidence of acute intracranial abnormality. 2. Left-sided sinusitis.                                                 TREATMENT:   Vitals:   12/22/23 0837  BP: 136/86  Pulse: 64  Seated on LUE, elevated but WFL for therapy  Patient able to tolerate mix of out in gym and in room with lights on  NMR: -Convergence/saccade task reading salient word vertically 3x moving closer each rep ~20 centimeters at greater distance (glasses donned)     M-CTSIB  Condition 1: Firm Surface, EO 30 Sec, Normal Sway  Condition 2: Firm Surface, EC 30 Sec, Normal Sway  Condition 3: Foam Surface, EO 30 Sec, Normal Sway  Condition 4: Foam Surface, EC 30 Sec, Min Sway but well compensated   - Corner balance HEP Eyes closed on foam with vertical/horizontal head turns 2 x 10 reps  - HART chart corner balance NBOS reading vertically across x 2 lines, diagonals x 2 lines (2 letters misread as something else when looking to the right)  - Brock string ladder progression visual jumps x 4 laps - tolerated ~ 9 cm  VESTIBULAR TREATMENT:  *  Glassess doffed  Gaze Adaptation: x1 Viewing Horizontal: Position: standing, Time: 27 seconds/30 seconds, Reps: 2, and Comment: more challenging than vertical, makes nauseous and x1 Viewing Vertical:  Position: standing, Time: 30 seconds, Reps: 2, and Comment: tolerates well first round, increased nausea and prolonged break after second round  Self Care: Assessed vitals as noted above Discussed return to work this week, encouraged maybe trial of 1 class at a time (or at most a half day) and add in as able as and to reach out to medical doctor to right note as needed   PATIENT EDUCATION: Education details: continue HEP Person educated: Patient Education method: Explanation, Demonstration, and Handouts Education comprehension: verbalized understanding and needs further education  HOME EXERCISE PROGRAM:  -Glenetta Lane string  -saccades   Sitting, slowly bend head down and pick up object on the floor. Return to upright position. Hold position until symptoms subside. Repeat __3__ times per  session. Do __3__ sessions per day. Head Motion: Side to Side    Sitting, tilt head down 15-30, slowly move head to right with eyes open. Hold position until symptoms subside. Then, move head slowly to opposite side. Hold position until symptoms subside. Repeat __10__ times per session. Do ___3_ sessions per day. Head Motion: Up and Down    Sitting, slowly move head up with eyes open. Hold position until symptoms subside. Then, move head in opposite direction. Hold position until symptoms subside. Repeat __10__ times per session. Do __3__ sessions per day. Gaze Stabilization: Sitting    Keeping eyes on target on wall 5 feet away, tilt head down 15-30 and move head side to side for _30___ seconds. Repeat while moving head up and down for __30__ seconds. Do __3__ sessions per day.  Access Code: [email or web address] URL: [email or web address] Date: 12/19/2023 Prepared by: Nickola Baron  Exercises - Seated Slump Nerve Glide  - 1 x daily - 7 x weekly - 3 sets - 10 reps  Access Code: [email or web address] URL: [email or web address] Date: 12/22/2023 Prepared by: Camella Cave  Exercises - Corner Balance Feet Together: Eyes Closed With Head Turns  - 1 x daily - 7 x weekly - 2-3 sets - 10 reps  GOALS: Goals reviewed with patient? Yes  LONG TERM GOALS: Target date: 01/15/2024  Patient will report demonstrate independence with final HEP in order to maintain current gains and continue to progress after physical therapy discharge.   Baseline: To be provided  Goal status: INITIAL  2.  Patient will improve RPQ-13 score to </= 35 per item to indicate reduced severity of post-concussive symptoms to progress towards PLOF.   Baseline: 43 Goal status: INITIAL  3.  Patient will improve RPQ-3 score to </= 7 per item to indicate reduced severity of post-concussive symptoms to progress towards PLOF.   Baseline: 11 Goal status: INITIAL  4.  Patient will report self-written in score on  RPQ for neck pain as =< 3/4 to demonstrate improved cervical function.  Baseline: 4/4 Goal status: INITIAL  5.  Pt will report </= 2/5 for all movements on MSQ to indicate improvement in motion sensitivity and improved activity tolerance.   Baseline: 4+/5 Goal status: REVISED  6.  mCTSIB to be assessed / LTG written Baseline: 120/120 Goal status: D/C - 120/120  ASSESSMENT:  CLINICAL IMPRESSION: Patient seen for skilled PT session with emphasis on continuing vestibular retraining with patient able to tolerate mix of out in gym and in room with lights on, demonstrating increased tolerance for loud and bright spaces in today's session.  Convergence and horizontal gaze stabilization tasks remain challenging but overall progressing towards goals. Continue POC.   OBJECTIVE IMPAIRMENTS: decreased balance, dizziness, and pain.   ACTIVITY LIMITATIONS: lifting, standing, sleeping, reach over head, and caring for others  PARTICIPATION LIMITATIONS: meal prep, laundry, community activity, and occupation  PERSONAL FACTORS: Age, Time since onset of injury/illness/exacerbation, and 1-2 comorbidities: see above  are also affecting patient's functional outcome.   REHAB POTENTIAL: Fair highly symptomatic and long history of chronic low back pain  CLINICAL DECISION MAKING: Evolving/moderate complexity  EVALUATION COMPLEXITY: Moderate   PLAN:  PT FREQUENCY: 2x/week (ideally but may have to modify frequency given copay)  PT DURATION: 3 weeks (anticipate may   PLANNED INTERVENTIONS: 91478- PT Re-evaluation, 97110-Therapeutic exercises, 97530- Therapeutic activity, 97112- Neuromuscular re-education, 97535- Self Care, 29562- Manual therapy, and 97116- Gait training  PLAN FOR NEXT SESSION:  Not planning on addressing back pain as long term chronic issue - may need to refer out,  holding in [place] Concussion given severity of symptoms at this time  Progress out to gym as tolerated, add busy  background or dynamic tasks, work on bending over and vertical fast head turns   Coreen Devoid, PT, DPT  12/22/2023, 9:22 AM

## 2023-12-23 ENCOUNTER — Ambulatory Visit: Admitting: Family Medicine

## 2023-12-25 ENCOUNTER — Ambulatory Visit: Admitting: Physical Therapy

## 2023-12-25 ENCOUNTER — Ambulatory Visit: Admitting: Occupational Therapy

## 2023-12-25 DIAGNOSIS — R2681 Unsteadiness on feet: Secondary | ICD-10-CM

## 2023-12-25 DIAGNOSIS — R41840 Attention and concentration deficit: Secondary | ICD-10-CM

## 2023-12-25 DIAGNOSIS — R42 Dizziness and giddiness: Secondary | ICD-10-CM | POA: Diagnosis not present

## 2023-12-25 DIAGNOSIS — M6281 Muscle weakness (generalized): Secondary | ICD-10-CM

## 2023-12-25 DIAGNOSIS — R29818 Other symptoms and signs involving the nervous system: Secondary | ICD-10-CM

## 2023-12-25 DIAGNOSIS — R278 Other lack of coordination: Secondary | ICD-10-CM

## 2023-12-25 NOTE — Therapy (Signed)
 OUTPATIENT PHYSICAL THERAPY VESTIBULAR TREATMENT  Patient Name: Theresa [name] MRN: [id number] DOB:August 25, 1971, 53 y.o., female Today's Date: 12/26/2023  END OF SESSION:  PT End of Session - 12/26/23 0951     Visit Number 5    Number of Visits 7    Date for PT Re-Evaluation 01/15/24    Authorization Type [employer]    PT Start Time 1451    PT Stop Time 1530    PT Time Calculation (min) 39 min    Equipment Utilized During Treatment Gait belt    Activity Tolerance Patient tolerated treatment well    Behavior During Therapy WFL for tasks assessed/performed              Past Medical History:  Diagnosis Date   Anemia    Anxiety    Attention deficit disorder    Bipolar disorder (HCC)    currently no meds- agitation noted during PAT assessment   Depression    GERD (gastroesophageal reflux disease)    resolved   H/O transfusion    17 yrs ago postpartum   Hyperlipidemia    resolved   Hypertension    history of HTN prior to gastric bypass   Hypoglycemia    Left breast abscess 01/22/2017   Sleep apnea, obstructive    cpap used auto settings, resolved gastric bypass   Past Surgical History:  Procedure Laterality Date   ABDOMINAL HYSTERECTOMY     ABDOMINOPLASTY     AUGMENTATION MAMMAPLASTY Bilateral 2016   BIOPSY  02/13/2022   Procedure: BIOPSY;  Surgeon: Urban Garden, MD;  [facility]: [facility];  Service: Gastroenterology;;   BREAST BIOPSY Left 01/2017   abscess removed   CESAREAN SECTION     COLON SURGERY     COLONOSCOPY WITH PROPOFOL  N/A 07/04/2021   Procedure: COLONOSCOPY WITH PROPOFOL ;  Surgeon: Urban Garden, MD;  [facility];  Service: Gastroenterology;  Laterality: N/A;  2:00 pm   ESOPHAGOGASTRODUODENOSCOPY (EGD) WITH PROPOFOL  N/A 02/13/2022   Procedure: ESOPHAGOGASTRODUODENOSCOPY (EGD) WITH PROPOFOL ;  Surgeon: Urban Garden, MD;  [facility];  Service: Gastroenterology;  Laterality: N/A;  815   GASTRIC  ROUX-EN-Y N/A 12/06/2013   Procedure: LAPAROSCOPIC ROUX-EN-Y GASTRIC BYPASS WITH UPPER ENDOSCOPY;  Surgeon: Fran Imus, MD;  Location: [facility];  Service: General;  Laterality: N/A;   INCISION AND DRAINAGE ABSCESS Left 01/22/2017   Procedure: INCISION AND DRAINAGE LEFT  BREAST ABSCESS;  Surgeon: Boyce Byes, MD;  Location: [facility];  Service: General;  Laterality: Left;   LAPAROTOMY N/A 02/18/2017   Procedure: LAPAROTOMY;  Surgeon: Woodrow Hazy, MD;  Location: [facility];  Service: Gynecology;  Laterality: N/A;   POLYPECTOMY  07/04/2021   Procedure: POLYPECTOMY;  Surgeon: Urban Garden, MD;  [facility];  Service: Gastroenterology;;   SALPINGOOPHORECTOMY Bilateral 02/18/2017   Procedure: BILATERAL SALPINGO OOPHORECTOMY;  Surgeon: Woodrow Hazy, MD;  Location: [facility];  Service: Gynecology;  Laterality: Bilateral;   TUBAL LIGATION     UNILATERAL SALPINGECTOMY Right 09/05/2015   Procedure: UNILATERAL SALPINGECTOMY;  Surgeon: Woodrow Hazy, MD;  Location[facility];  Service: Gynecology;  Laterality: Right;   VAGINAL HYSTERECTOMY N/A 09/05/2015   Procedure: HYSTERECTOMY VAGINAL;  Surgeon: Woodrow Hazy, MD;  Location: [facility];  Service: Gynecology;  Laterality: N/A;   WISDOM TOOTH EXTRACTION     Patient Active Problem List   Diagnosis Date Noted   Barrett's esophagus without dysplasia 04/25/2023   Heartburn 04/21/2023   Drug-induced liver  injury 04/25/2022   IBS (irritable colon syndrome) 04/25/2022   Nausea with vomiting 12/24/2021   Lymphadenopathy 12/08/2019   Left breast lump 12/08/2019   Insomnia 11/09/2019   History of benign adrenal tumor 01/08/2019   Abdominal pain, chronic, left lower quadrant (Primary Area of Pain) 07/27/2018   Chronic left-sided low back pain (Secondary Area of Pain) 07/27/2018   Chronic pain syndrome 07/27/2018   Pharmacologic therapy 07/27/2018   Disorder of skeletal system 07/27/2018   Problems influencing  health status 07/27/2018   Hypoglycemia after GI (gastrointestinal) surgery 04/10/2018   Estrogen deficiency 07/01/2017   Lower abdominal pain 06/03/2017   Alcohol use    Bipolar affect, depressed (HCC) 03/05/2017   Chronic post-traumatic stress disorder (PTSD) 03/05/2017   Bipolar affective disorder, current episode mixed (HCC) 03/05/2017   Neuropathic pain 02/18/2017   Adenomyosis 09/05/2015   Anemia 04/20/2015   Hyperglycemia 08/18/2014   S/P gastric bypass 12/06/2013   GERD (gastroesophageal reflux disease) 10/13/2013   Essential hypertension 10/02/2013   Hyperlipidemia 01/05/2013    PCP: Ezell Hollow, NP REFERRING PROVIDER: Rodgers Clack, DO  REFERRING DIAG: S06.0X9A (ICD-10-CM) - Concussion with loss of consciousness, initial encounter F07.81 (ICD-10-CM) - Postconcussive syndrome  THERAPY DIAG:  Dizziness and giddiness  Unsteadiness on feet  ONSET DATE: 12/09/2023 (referral date)  Rationale for Evaluation and Treatment: Rehabilitation  SUBJECTIVE:   SUBJECTIVE STATEMENT: Patient reports she is doing a lot better today; states she went outside this morning and fed her chickens and bunnies - says "it was the first time I have done that".  Did not go to the Science fair at her school earlier this week because her MD would not give her a note allowing her to attend - he thought it would be too stimulating for her to be in that environment  Pt accompanied by: self - drove self  PERTINENT HISTORY: PTSD, bipolar disorder, chronic back pain and pain disorder  PAIN:  Are you having pain? Yes: NPRS scale: 7/10 Pain location: HA Pain description: "tingly"  PRECAUTIONS: Fall  PATIENT GOALS: "To be able to move without pain."   OBJECTIVE:  Note: Objective measures were completed at Evaluation unless otherwise noted.  DIAGNOSTIC FINDINGS:   CT Head wo contrast 12/03/2023: IMPRESSION: 1. No evidence of acute intracranial abnormality. 2. Left-sided sinusitis.                                                 TREATMENT: 12-25-23  NeuroRe-ed:  X1 viewing exercise - pt performed gaze stabilization in standing position with target placed on picture (in room 1) for busy background; 15 secs x 1 rep horizontal, 15 secs x 1 rep vertical head turns (glasses donned); 2nd set - 15 secs x 1 rep horizontal and then 15 secs x 1 rep vertical head turns; pt reported mild increase in dizziness after completion of 2nd rep but able to continue with PT (no rest period needed)  Pt stood on [employer] in corner - no head movement -- stood with EC for 15 secs without LOB Performed slow horizontal head turns 5 reps with EC and then vertical head turns with EC 5 reps Pt performed stepping down to floor from [employer] with contralateral head turn 5 reps each  Pt stood on floor - held patterned ball at arm's length - made circles CW 5 reps and  then CCW 5 reps - focusing on ball with eyes and head moving;  pt needed rest period after this activity due to provocation of nausea (did not attempt ambulating with ball moving due to increased provocation of symptoms)  Rockerboard inside // bars - bil. UE support - pt performed rocking anterior/posteriorly with EO 10 reps; held board steady - chose objects horizontally on each side - performed 5 reps head turns focusing on each object holding board steady  Blue mat placed on floor by large mirror - pt performed marching forwards/backwards with EO without head turns 3 laps forward/backward;  progressed to marching forwards only with EO with head turns 2 reps  Amb. 25' with horizontal head turns on floor - moderate postural sway exhibited   PATIENT EDUCATION: Education details: Progressed HEP to gaze stabilization with patterned background but was instructed to choose one that was not excessively busy - 15 secs only and less duration if needed due to lack of tolerance;  also progressed standing on foam with EC for HEP - add horizontal and vertical head  turns as tolerated, </= 5 reps each - pt verbalized understanding and declined written instruction/handout with these 2 progressions Person educated: Patient Education method: Explanation, Demonstration, and Handouts Education comprehension: verbalized understanding and needs further education  HOME EXERCISE PROGRAM:  -Glenetta Lane string  -saccades   Sitting, slowly bend head down and pick up object on the floor. Return to upright position. Hold position until symptoms subside. Repeat __3__ times per session. Do __3__ sessions per day. Head Motion: Side to Side    Sitting, tilt head down 15-30, slowly move head to right with eyes open. Hold position until symptoms subside. Then, move head slowly to opposite side. Hold position until symptoms subside. Repeat __10__ times per session. Do ___3_ sessions per day. Head Motion: Up and Down    Sitting, slowly move head up with eyes open. Hold position until symptoms subside. Then, move head in opposite direction. Hold position until symptoms subside. Repeat __10__ times per session. Do __3__ sessions per day. Gaze Stabilization: Sitting    Keeping eyes on target on wall 5 feet away, tilt head down 15-30 and move head side to side for _30___ seconds. Repeat while moving head up and down for __30__ seconds. Do __3__ sessions per day.  Access Code: [email or web address] URL: [email or web address] Date: 12/19/2023 Prepared by: Nickola Baron  Exercises - Seated Slump Nerve Glide  - 1 x daily - 7 x weekly - 3 sets - 10 reps  Access Code: [email or web address] URL: [email or web address] Date: 12/22/2023 Prepared by: Camella Cave  Exercises - Corner Balance Feet Together: Eyes Closed With Head Turns  - 1 x daily - 7 x weekly - 2-3 sets - 10 reps  GOALS: Goals reviewed with patient? Yes  LONG TERM GOALS: Target date: 01/15/2024  Patient will report demonstrate independence with final HEP in order to maintain current gains and  continue to progress after physical therapy discharge.   Baseline: To be provided  Goal status: INITIAL  2.  Patient will improve RPQ-13 score to </= 35 per item to indicate reduced severity of post-concussive symptoms to progress towards PLOF.   Baseline: 43 Goal status: INITIAL  3.  Patient will improve RPQ-3 score to </= 7 per item to indicate reduced severity of post-concussive symptoms to progress towards PLOF.   Baseline: 11 Goal status: INITIAL  4.  Patient will report self-written in score on RPQ for neck pain as =< 3/4 to demonstrate improved cervical  function.  Baseline: 4/4 Goal status: INITIAL  5.  Pt will report </= 2/5 for all movements on MSQ to indicate improvement in motion sensitivity and improved activity tolerance.   Baseline: 4+/5 Goal status: REVISED  6.  mCTSIB to be assessed / LTG written Baseline: 120/120 Goal status: D/C - 120/120  ASSESSMENT:  CLINICAL IMPRESSION: PT session focused on balance and visual/vestibular exercises to improve gaze stabilization and balance with visual scanning with head turns.  Pt is most challenged by visual exercises with reports moderate to severe provocation of symptoms with these activities.  Pt did well with performing balance exercises with increased vestibular input incorporated.  Continue POC.   OBJECTIVE IMPAIRMENTS: decreased balance, dizziness, and pain.   ACTIVITY LIMITATIONS: lifting, standing, sleeping, reach over head, and caring for others  PARTICIPATION LIMITATIONS: meal prep, laundry, community activity, and occupation  PERSONAL FACTORS: Age, Time since onset of injury/illness/exacerbation, and 1-2 comorbidities: see above  are also affecting patient's functional outcome.   REHAB POTENTIAL: Fair highly symptomatic and long history of chronic low back pain  CLINICAL DECISION MAKING: Evolving/moderate complexity  EVALUATION COMPLEXITY: Moderate   PLAN:  PT FREQUENCY: 2x/week (ideally but may have to  modify frequency given copay)  PT DURATION: 3 weeks (anticipate may   PLANNED INTERVENTIONS: 16109- PT Re-evaluation, 97110-Therapeutic exercises, 97530- Therapeutic activity, 97112- Neuromuscular re-education, 97535- Self Care, 60454- Manual therapy, and 97116- Gait training  PLAN FOR NEXT SESSION:  Not planning on addressing back pain as long term chronic issue - may need to refer out,  holding in [place] Concussion given severity of symptoms at this time  Progress out to gym as tolerated, add busy background or dynamic tasks, work on bending over and vertical fast head turns   Dorsie Gaunt, PT  12/26/2023, 9:54 AM

## 2023-12-25 NOTE — Therapy (Signed)
 OUTPATIENT OCCUPATIONAL THERAPY NEURO Treatment  Patient Name: Theresa [name] MRN: [id number] DOB:Feb 04, 1971, 53 y.o., female Today's Date: 12/25/2023  PCP: Karl Bales, NP  REFERRING PROVIDER: Andi Devon, DO   END OF SESSION:  OT End of Session - 12/25/23 1632     Visit Number 3    Number of Visits 7   including eval   Date for OT Re-Evaluation 01/16/24    Authorization Type [employer] [id number]    OT Start Time 1536    OT Stop Time 1615    OT Time Calculation (min) 39 min    Activity Tolerance Other (comment);Patient tolerated treatment well   pt limited by eye fatigue/pain   Behavior During Therapy [facility] for tasks assessed/performed               Past Medical History:  Diagnosis Date   Anemia    Anxiety    Attention deficit disorder    Bipolar disorder (HCC)    currently no meds- agitation noted during PAT assessment   Depression    GERD (gastroesophageal reflux disease)    resolved   H/O transfusion    17 yrs ago postpartum   Hyperlipidemia    resolved   Hypertension    history of HTN prior to gastric bypass   Hypoglycemia    Left breast abscess 01/22/2017   Sleep apnea, obstructive    cpap used auto settings, resolved gastric bypass   Past Surgical History:  Procedure Laterality Date   ABDOMINAL HYSTERECTOMY     ABDOMINOPLASTY     AUGMENTATION MAMMAPLASTY Bilateral 2016   BIOPSY  02/13/2022   Procedure: BIOPSY;  Surgeon: Dolores Frame, MD;  [facility]: [facility];  Service: Gastroenterology;;   BREAST BIOPSY Left 01/2017   abscess removed   CESAREAN SECTION     COLON SURGERY     COLONOSCOPY WITH PROPOFOL N/A 07/04/2021   Procedure: COLONOSCOPY WITH PROPOFOL;  Surgeon: Dolores Frame, MD;  [facility]: [facility];  Service: Gastroenterology;  Laterality: N/A;  2:00 pm   ESOPHAGOGASTRODUODENOSCOPY (EGD) WITH PROPOFOL N/A 02/13/2022   Procedure: ESOPHAGOGASTRODUODENOSCOPY (EGD) WITH PROPOFOL;  Surgeon: Dolores Frame, MD;  [facility];  Service: Gastroenterology;  Laterality: N/A;  815   GASTRIC ROUX-EN-Y N/A 12/06/2013   Procedure: LAPAROSCOPIC ROUX-EN-Y GASTRIC BYPASS WITH UPPER ENDOSCOPY;  Surgeon: Atilano Ina, MD;  Location: [facility];  Service: General;  Laterality: N/A;   INCISION AND DRAINAGE ABSCESS Left 01/22/2017   Procedure: INCISION AND DRAINAGE LEFT  BREAST ABSCESS;  Surgeon: Claud Kelp, MD;  Location: [facility];  Service: General;  Laterality: Left;   LAPAROTOMY N/A 02/18/2017   Procedure: LAPAROTOMY;  Surgeon: Richarda Overlie, MD;  Location: [facility];  Service: Gynecology;  Laterality: N/A;   POLYPECTOMY  07/04/2021   Procedure: POLYPECTOMY;  Surgeon: Dolores Frame, MD;  [facility]: [facility];  Service: Gastroenterology;;   SALPINGOOPHORECTOMY Bilateral 02/18/2017   Procedure: BILATERAL SALPINGO OOPHORECTOMY;  Surgeon: Richarda Overlie, MD;  Location: [facility];  Service: Gynecology;  Laterality: Bilateral;   TUBAL LIGATION     UNILATERAL SALPINGECTOMY Right 09/05/2015   Procedure: UNILATERAL SALPINGECTOMY;  Surgeon: Richarda Overlie, MD;  Location: [facility];  Service: Gynecology;  Laterality: Right;   VAGINAL HYSTERECTOMY N/A 09/05/2015   Procedure: HYSTERECTOMY VAGINAL;  Surgeon: Richarda Overlie, MD;  Location: [facility];  Service: Gynecology;  Laterality: N/A;   WISDOM TOOTH EXTRACTION     Patient Active Problem List  Diagnosis Date Noted   Barrett's esophagus without dysplasia 04/25/2023   Heartburn 04/21/2023   Drug-induced liver injury 04/25/2022   IBS (irritable colon syndrome) 04/25/2022   Nausea with vomiting 12/24/2021   Lymphadenopathy 12/08/2019   Left breast lump 12/08/2019   Insomnia 11/09/2019   History of benign adrenal tumor 01/08/2019   Abdominal pain, chronic, left lower quadrant (Primary Area of Pain) 07/27/2018   Chronic left-sided low back pain (Secondary Area of Pain) 07/27/2018   Chronic pain syndrome 07/27/2018    Pharmacologic therapy 07/27/2018   Disorder of skeletal system 07/27/2018   Problems influencing health status 07/27/2018   Hypoglycemia after GI (gastrointestinal) surgery 04/10/2018   Estrogen deficiency 07/01/2017   Lower abdominal pain 06/03/2017   Alcohol use    Bipolar affect, depressed (HCC) 03/05/2017   Chronic post-traumatic stress disorder (PTSD) 03/05/2017   Bipolar affective disorder, current episode mixed (HCC) 03/05/2017   Neuropathic pain 02/18/2017   Adenomyosis 09/05/2015   Anemia 04/20/2015   Hyperglycemia 08/18/2014   S/P gastric bypass 12/06/2013   GERD (gastroesophageal reflux disease) 10/13/2013   Essential hypertension 10/02/2013   Hyperlipidemia 01/05/2013    ONSET DATE: 12/09/23 (referral date)  REFERRING DIAG:  S06.0X9A (ICD-10-CM) - Concussion with loss of consciousness, initial encounter  F07.81 (ICD-10-CM) - Postconcussive syndrome   Per 12/09/23 referral notes: "Post concussion therapy-visual spacial deficits"   THERAPY DIAG:  Other lack of coordination  Muscle weakness (generalized)  Other symptoms and signs involving the nervous system  Attention and concentration deficit  Rationale for Evaluation and Treatment: Rehabilitation  SUBJECTIVE:   SUBJECTIVE STATEMENT: Pt reported feeling pretty good this afternoon and fed animals by herself outside. Pt reported headache pain before going outside was 3/10 pain, which then increased significantly after being outside in bright lighting.   Pt had PT prior to OT session today. PT updated OT that pt had some pain after PT session. Pt reported feeling better after PT session when OT session began.  Pt accompanied by: self - pt drove self  PERTINENT HISTORY: PTSD, bipolar disorder, chronic back pain and pain disorder   CT Head wo contrast 12/03/2023: IMPRESSION: 1. No evidence of acute intracranial abnormality. 2. Left-sided sinusitis.  Per 12/11/23 PT Evaluation Note: "Patient reports that she fell  and hit her face on the counter and back of head on toilet with loss of consciousness for about a minute or two; she was on a muscle relaxor for back pain at time of injury. Patient was also throwing up after the injury. She had the accident on Saturday, March 21-22nd. Patient reports since the accident she has had really bad headaches, light sensitivity, twitching eye, difficulty getting words out, balance, and pain in head and neck as well as low back. Patient denies previous concussions. Patient reports that her headaches are constant since accident but are really bad following activity and last for several hours. Patient is not currently on any medications for headaches to her knowledge.   PRECAUTIONS: FALL  WEIGHT BEARING RESTRICTIONS: No  PAIN:  Are you having pain? 7/10 headache and eye fatigue  FALLS: Has patient fallen in last 6 months? Yes. 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  HAND DOMINANCE: Right  ADLs: Overall ADLs:  Transfers/ambulation related to ADLs: Eating: ind, no difficulty. Pt reported reduced appetite since the injury. Grooming: ind, difficulty grasping toothbrush "when my hands stiffen up" - toothbrush sometimes fall out of pt's mouth/hand, "I make a mess." UB Dressing: ind, stiffness near neck so some difficulty with lifting arm  "it feels like a sharp pinch, not painful." Pt has not attempted buttons/zippers. LB Dressing: ind, difficult d/t low back pain. Completes  task sitting Toileting: ind Bathing: ind, standing though has shower chair available in the shower if pt gets dizzy, walk-in shower  Tub Shower transfers: no difficulty Equipment: Walk in shower  IADLs: Shopping: family assists, pt previously completed grocery shopping Light housekeeping: Pt reported "some of it I'm okay" (washing dishes, wiping counters, folding laundry) though family assists with sweeping and transferring clothes for laundry (pt experiences dizziness when bending at waist) Meal Prep: "I haven't really been eating." Pt reported last eating last night around 6 PM. Pt reported drinking water for hydration.  Community mobility: drove self Medication management: family assists for set-up, uses medication organizer  Handwriting:  Pt wrote name and simple sentence with 100% legibility. No concerns noted. Pt sometimes stretched hand likely d/t stiffness. Pt reported "it hurts" across hand when holding pen.  Occupation: Middle Engineer, site. Difficulty with picking up objects, pt reported preference to work on "more like FM skills without it hurting so bad"   MOBILITY STATUS:  ind  POSTURE COMMENTS:  Sitting balance ind  ACTIVITY TOLERANCE: Activity tolerance: Pt reported nausea and dizziness if working on a task.  Pt reported nausea currently.  FUNCTIONAL OUTCOME MEASURES: PSFS: 3.6    Total score = sum of the activity scores/number of activities Minimum detectable change (90%CI) for average score = 2 points Minimum detectable change (90%CI) for single activity score = 3 points   UPPER EXTREMITY ROM:    Active ROM Right eval Left eval  Shoulder flexion Slightly less AROM on R side d/t neck pain, though [facility]  Shoulder abduction    Shoulder adduction    Shoulder extension    Shoulder internal rotation    Shoulder external rotation    Elbow flexion [facility] WFL  Elbow extension    Wrist flexion    Wrist extension    Wrist ulnar deviation    Wrist radial  deviation    Wrist pronation    Wrist supination    (Blank rows = not tested)  BUE Composite digit flex/ext - WFL  UPPER EXTREMITY MMT:     MMT Right eval Left eval  Shoulder flexion    Shoulder abduction    Shoulder adduction    Shoulder extension    Shoulder internal rotation    Shoulder external rotation    Middle trapezius    Lower trapezius    Elbow flexion    Elbow extension    Wrist flexion    Wrist extension    Wrist ulnar deviation    Wrist radial deviation    Wrist pronation    Wrist supination    (Blank rows = not tested)  HAND FUNCTION: Grip strength: Right: 39.4, 24.9, 25.7 (30 lbs average) lbs; Left: 39.4, 44.3, 47.6 (43.7 lbs average)  lbs  COORDINATION:  9-hole peg test: Right: 45 lbs; Left: 41 lbs  RUE:  Pt demo'd difficulty with initial grasp of item. Pt paused to stretch out fingers 2x near end of task. Pt reported 7/10 pain level. Pt reported "the more I tried using it, the more it hurts in wrist."  LUE: Pt paused to stretch out fingers 5x. Pt reported pain of hand and pointed out over thenar eminence. Pt reported 4/10 pain level.  SENSATION: Pt denied tingling/numbness in B arm/shoulder/hands. Pt reported tingling in head "all the time" 10/10.   12/22/23 update - OT noted pt had several small cuts on B hands with bandages covering some cuts. Pt reported accidentally injuring hands when caring for animals. Pt reported she was not aware that the small cuts occurred at the time though notices the cuts now. OT questioning ?potential sensory involvement at BUE.  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 Pt reported needing to wear glasses all the time. Pt reported glasses help  though pt reported difficulty tolerating pressure of glasses and pointed out under eye the glasses "doesn't even touch but it hurts here." Pt reported pain/pressure decreases when removing glasses.  Baseline vision: Wears glasses all the time Visual history: None noted  VISION ASSESSMENT: Not tested  PERCEPTION: Not tested  PRAXIS: Not tested  OBSERVATIONS: Pt was pleasant. Pt ambulated without A/E. When seated and not participating in conversation or a task, pt sometimes closed eyes and held her head in her hands. Pt benefited from low-light environment d/t headache pain. Pt often stretched out hands briefly during functional tasks involving grasp secondary to B hand pain based on pt report.                                                                                                                             TREATMENT DATE:    Self-Care OT educated pt on A/E options of reacher, built-up handles for grooming and handwriting tasks, foam tubing for built-up handles. Pt practiced with each A/E option in clinic today and OT showed additional examples online. Pt verbalized understanding. OT provided foam tubing to pt for handwriting and toothbrushing tasks.  OT discussed and educated pt on adaptive strategies for I/ADL tasks, such as completing household tasks in seated position or in squat position to avoid bending over and to prevent symptom exacerbation. OT reviewed safety for adaptive strategies, such as placing rolling stool against wall to reduce fall risk. Pt verbalized understanding and returned demo of adaptive strategies.  OT educated pt on using line guide when reading to reduce eye strain. Pt verbalized understanding.   TherAct OT attempted to initiate FM coordination HEP. OT provided large print copy of handout for FM coordination HEP d/t pt c/o eye pain. Pt attempted to read a few  lines with line guide and then reported sharp pain behind R eye. Pt required a rest break in a  quiet room with low light to decrease pain/discomfort. Pt reported increased pain today likely d/t spending time outside in bright lighting prior to therapy sessions and participating in eye exercises during PT. Pt reported she did not take breaks when offered in PT and acknowledged this may have also contributed to eye pain. D/t eye pain, OT instead initiated theraputty exercises to avoid eye strain.  OT initiated pink theraputty HEP - to improve RUE strengthening and coordination. Pt returned demo. Pt benefited from v/c for posture and education regarding body mechanics/ergonomic principles. Access Code: [email or web address] URL: [email or web address] Date: 12/25/2023 Prepared by: Daina Drum  Exercises - Putty Squeezes  - 2 x daily - 1 sets - 10 reps - Rolling Putty on Table  - 2 x daily - 1 sets - 10 reps - Tip Pinch with Putty  - 2 x daily - 1 sets - 10 reps - Seated Three Finger Pinch with Putty  - 2 x daily - 1 sets - 10 reps - Finger Key Grip with Putty  - 2 x daily - 1 sets - 10 reps   PATIENT EDUCATION: Education details: see today's tx above Person educated: Patient Education method: Explanation Education comprehension: verbalized understanding  HOME EXERCISE PROGRAM: 12/22/23 - tendon glides, sleep hygiene, energy conservation, sensory safety precautions (see pt instructions) 12/25/23 - pink theraputty HEP, Access Code: [email or web address]   GOALS: Goals reviewed with patient? Yes  SHORT TERM GOALS: Target date: 01/02/24  Pt will be ind with initial HEP using visual handouts. Baseline: new to outpt OT 12/22/23 - tendon glides issued, pt returned demo. Goal status: in progress  2.  Pt will ind recall at least 3 energy conservation strategies. Baseline: pt reported fatigue, symptoms of nausea and dizziness when completing functional tasks Goal status: in progress  3.  Pt will demo understanding of adaptive strategies and A/E to reduce pain and improve efficiency with  ADLs/IADLs. Baseline: Pt reported difficulty with UB/LB dressing d/t pain, difficulty with sweeping and laundry d/t symptoms of nausea and dizziness, no appetite for food and not eating as often. Goal status: in progress  4.  Pt will recall at least 3 strategies to reduce pain/stiffness of BUE. Baseline: Pt reported pain/stiffness of B hands during functional tasks. Goal status: in progress  LONG TERM GOALS: Target date: 01/16/24  Patient will report at least two-point increase in average PSFS score or at least three-point increase in a single activity score indicating functionally significant improvement given minimum detectable change. Baseline: PSFS: 3.6 total score (See above for individual activity scores)  Goal status: INITIAL  2.  Patient will maintain at least 30 lbs RUE grip strength across all 3 trials as needed to grasp toothbrush and writing utensils. Baseline: Grip strength: Right: 39.4, 24.9, 25.7 (30 lbs average) lbs; Left: 39.4, 44.3, 47.6 (43.7 lbs average)  lbs Goal status: INITIAL  3.  Patient will demo improved FM coordination as evidenced by completing nine-hole peg with use of RUE in 40 seconds or less.  Baseline: 9-hole peg test: Right: 45 lbs; Left: 41 lbs Goal status: INITIAL  ASSESSMENT:  CLINICAL IMPRESSION: Pt tolerated I/ADL simulated practice and participated in education regarding A/E options and adaptive strategies for I/ADLs without difficulty. However, pt's participation became limited when attempting to read 3 lines of text today, as evidenced by increased eye pain. Pt seemed to benefit from a quiet rest break  in a low light environment to manage pain. Pt would likely benefit from verbal reminders to take breaks PRN to avoid extensive eye strain during functional tasks. Pt tolerated theraputty exercises well for remainder of session. OT recommended to pt to call for a ride from family member if pain is too much to drive safely. Pt verbalized understanding and  stated intent to call daughter for ride if unable to tolerate bright sunshine after exiting clinic.  Pt would benefit from skilled OT services in the outpatient setting to work on impairments as noted below.  PERFORMANCE DEFICITS: in functional skills including ADLs, IADLs, coordination, dexterity, proprioception, sensation, ROM, strength, pain, flexibility, Fine motor control, Gross motor control, mobility, body mechanics, endurance, vision, and UE functional use, cognitive skills including attention and energy/drive, and psychosocial skills including environmental adaptation.   IMPAIRMENTS: are limiting patient from ADLs, IADLs, rest and sleep, work, leisure, and social participation.   CO-MORBIDITIES: may have co-morbidities  that affects occupational performance. Patient will benefit from skilled OT to address above impairments and improve overall function.  MODIFICATION OR ASSISTANCE TO COMPLETE EVALUATION: Min-Moderate modification of tasks or assist with assess necessary to complete an evaluation.  OT OCCUPATIONAL PROFILE AND HISTORY: Detailed assessment: Review of records and additional review of physical, cognitive, psychosocial history related to current functional performance.  CLINICAL DECISION MAKING: Moderate - several treatment options, min-mod task modification necessary  REHAB POTENTIAL: Good  EVALUATION COMPLEXITY: Moderate    PLAN:  OT FREQUENCY: 1-2x/week  OT DURATION: 3 weeks   PLANNED INTERVENTIONS: 97168 OT Re-evaluation, 97535 self care/ADL training, 26948 therapeutic exercise, 97530 therapeutic activity, 97112 neuromuscular re-education, 97140 manual therapy, 97035 ultrasound, 97018 paraffin, 54627 fluidotherapy, 97010 moist heat, 97010 cryotherapy, passive range of motion, functional mobility training, visual/perceptual remediation/compensation, energy conservation, patient/family education, and DME and/or AE instructions  RECOMMENDED OTHER SERVICES: PT eval  completed, SLP eval scheduled  CONSULTED AND AGREED WITH PLAN OF CARE: Patient  PLAN FOR NEXT SESSION:   Review PRN: Tendon glides, energy conservation, theraputty Monitor sensation of BUE INITIATE: FM coordination   Oakley Bellman, OT 12/25/2023, 4:46 PM

## 2023-12-29 ENCOUNTER — Ambulatory Visit

## 2023-12-29 ENCOUNTER — Ambulatory Visit: Admitting: Occupational Therapy

## 2023-12-29 DIAGNOSIS — M6281 Muscle weakness (generalized): Secondary | ICD-10-CM

## 2023-12-29 DIAGNOSIS — R42 Dizziness and giddiness: Secondary | ICD-10-CM | POA: Diagnosis not present

## 2023-12-29 DIAGNOSIS — R2681 Unsteadiness on feet: Secondary | ICD-10-CM

## 2023-12-29 DIAGNOSIS — R278 Other lack of coordination: Secondary | ICD-10-CM

## 2023-12-29 DIAGNOSIS — R29818 Other symptoms and signs involving the nervous system: Secondary | ICD-10-CM

## 2023-12-29 DIAGNOSIS — R41840 Attention and concentration deficit: Secondary | ICD-10-CM

## 2023-12-29 NOTE — Therapy (Signed)
 OUTPATIENT OCCUPATIONAL THERAPY NEURO Treatment  Patient Name: Theresa [name] MRN: [id number] DOB:1970/12/05, 53 y.o., female Today's Date: 12/29/2023  PCP: Ezell Hollow, NP  REFERRING PROVIDER: Rodgers Clack, DO   END OF SESSION:  OT End of Session - 12/29/23 1458     Visit Number 4    Number of Visits 7   including eval   Date for OT Re-Evaluation 01/16/24    Authorization Type [employer] 2025    OT Start Time 1320    OT Stop Time 1358    OT Time Calculation (min) 38 min    Activity Tolerance Patient tolerated treatment well    Behavior During Therapy WFL for tasks assessed/performed                Past Medical History:  Diagnosis Date   Anemia    Anxiety    Attention deficit disorder    Bipolar disorder (HCC)    currently no meds- agitation noted during PAT assessment   Depression    GERD (gastroesophageal reflux disease)    resolved   H/O transfusion    17 yrs ago postpartum   Hyperlipidemia    resolved   Hypertension    history of HTN prior to gastric bypass   Hypoglycemia    Left breast abscess 01/22/2017   Sleep apnea, obstructive    cpap used auto settings, resolved gastric bypass   Past Surgical History:  Procedure Laterality Date   ABDOMINAL HYSTERECTOMY     ABDOMINOPLASTY     AUGMENTATION MAMMAPLASTY Bilateral 2016   BIOPSY  02/13/2022   Procedure: BIOPSY;  Surgeon: Urban Garden, MD;  Location: [facility];  Service: Gastroenterology;;   BREAST BIOPSY Left 01/2017   abscess removed   CESAREAN SECTION     COLON SURGERY     COLONOSCOPY WITH PROPOFOL  N/A 07/04/2021   Procedure: COLONOSCOPY WITH PROPOFOL ;  Surgeon: Urban Garden, MD;  Location: [facility];  Service: Gastroenterology;  Laterality: N/A;  2:00 pm   ESOPHAGOGASTRODUODENOSCOPY (EGD) WITH PROPOFOL  N/A 02/13/2022   Procedure: ESOPHAGOGASTRODUODENOSCOPY (EGD) WITH PROPOFOL ;  Surgeon: Urban Garden, MD;  Location: [facility];  Service:  Gastroenterology;  Laterality: N/A;  815   GASTRIC ROUX-EN-Y N/A 12/06/2013   Procedure: LAPAROSCOPIC ROUX-EN-Y GASTRIC BYPASS WITH UPPER ENDOSCOPY;  Surgeon: Fran Imus, MD;  Location: [facility];  Service: General;  Laterality: N/A;   INCISION AND DRAINAGE ABSCESS Left 01/22/2017   Procedure: INCISION AND DRAINAGE LEFT  BREAST ABSCESS;  Surgeon: Boyce Byes, MD;  Location: [facility];  Service: General;  Laterality: Left;   LAPAROTOMY N/A 02/18/2017   Procedure: LAPAROTOMY;  Surgeon: Woodrow Hazy, MD;  Location: [facility];  Service: Gynecology;  Laterality: N/A;   POLYPECTOMY  07/04/2021   Procedure: POLYPECTOMY;  Surgeon: Urban Garden, MD;  Location: [facility];  Service: Gastroenterology;;   SALPINGOOPHORECTOMY Bilateral 02/18/2017   Procedure: BILATERAL SALPINGO OOPHORECTOMY;  Surgeon: Woodrow Hazy, MD;  Location: [facility];  Service: Gynecology;  Laterality: Bilateral;   TUBAL LIGATION     UNILATERAL SALPINGECTOMY Right 09/05/2015   Procedure: UNILATERAL SALPINGECTOMY;  Surgeon: Woodrow Hazy, MD;  Location: [facility];  Service: Gynecology;  Laterality: Right;   VAGINAL HYSTERECTOMY N/A 09/05/2015   Procedure: HYSTERECTOMY VAGINAL;  Surgeon: Woodrow Hazy, MD;  Location: [facility];  Service: Gynecology;  Laterality: N/A;   WISDOM TOOTH EXTRACTION     Patient Active Problem List   Diagnosis Date Noted   Barrett's  esophagus without dysplasia 04/25/2023   Heartburn 04/21/2023   Drug-induced liver injury 04/25/2022   IBS (irritable colon syndrome) 04/25/2022   Nausea with vomiting 12/24/2021   Lymphadenopathy 12/08/2019   Left breast lump 12/08/2019   Insomnia 11/09/2019   History of benign adrenal tumor 01/08/2019   Abdominal pain, chronic, left lower quadrant (Primary Area of Pain) 07/27/2018   Chronic left-sided low back pain (Secondary Area of Pain) 07/27/2018   Chronic pain syndrome 07/27/2018   Pharmacologic therapy 07/27/2018   Disorder of  skeletal system 07/27/2018   Problems influencing health status 07/27/2018   Hypoglycemia after GI (gastrointestinal) surgery 04/10/2018   Estrogen deficiency 07/01/2017   Lower abdominal pain 06/03/2017   Alcohol use    Bipolar affect, depressed (HCC) 03/05/2017   Chronic post-traumatic stress disorder (PTSD) 03/05/2017   Bipolar affective disorder, current episode mixed (HCC) 03/05/2017   Neuropathic pain 02/18/2017   Adenomyosis 09/05/2015   Anemia 04/20/2015   Hyperglycemia 08/18/2014   S/P gastric bypass 12/06/2013   GERD (gastroesophageal reflux disease) 10/13/2013   Essential hypertension 10/02/2013   Hyperlipidemia 01/05/2013    ONSET DATE: 12/09/23 (referral date)  REFERRING DIAG:  S06.0X9A (ICD-10-CM) - Concussion with loss of consciousness, initial encounter  F07.81 (ICD-10-CM) - Postconcussive syndrome   Per 12/09/23 referral notes: "Post concussion therapy-visual spacial deficits"   THERAPY DIAG:  Other lack of coordination  Muscle weakness (generalized)  Other symptoms and signs involving the nervous system  Attention and concentration deficit  Rationale for Evaluation and Treatment: Rehabilitation  SUBJECTIVE:   SUBJECTIVE STATEMENT: Pt reported feeling "wonderful" today. Pt reported going outside today and all weekend with "no trouble." Pt reported no headache pain the last few days "none, at all." Pt reported noticing improved function, such as bending over without as much dizziness, improving grip strength, and tolerating being outside and improved light sensitivity. Pt reported continued difficulty with reading and some dizziness when bending over during functional tasks.  Pt accompanied by: self - pt drove self  PERTINENT HISTORY: PTSD, bipolar disorder, chronic back pain and pain disorder   CT Head wo contrast 12/03/2023: IMPRESSION: 1. No evidence of acute intracranial abnormality. 2. Left-sided sinusitis.  Per 12/11/23 PT Evaluation Note: "Patient  reports that she fell and hit her face on the counter and back of head on toilet with loss of consciousness for about a minute or two; she was on a muscle relaxor for back pain at time of injury. Patient was also throwing up after the injury. She had the accident on Saturday, March 21-22nd. Patient reports since the accident she has had really bad headaches, light sensitivity, twitching eye, difficulty getting words out, balance, and pain in head and neck as well as low back. Patient denies previous concussions. Patient reports that her headaches are constant since accident but are really bad following activity and last for several hours. Patient is not currently on any medications for headaches to her knowledge.   PRECAUTIONS: FALL  WEIGHT BEARING RESTRICTIONS: No  PAIN:  Are you having pain? No  FALLS: Has patient fallen in last 6 months? Yes. Number of falls 1 at time of injury, needed husbands help to get up.  12/29/23 - Pt fell outside "I stuck my foot in a hole in the ground." Pt reported no injuries and able to recover ind.   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  HAND DOMINANCE: Right  ADLs: Overall ADLs:  Transfers/ambulation related to ADLs: Eating: ind, no difficulty. Pt reported reduced appetite since the injury. Grooming: ind, difficulty grasping toothbrush "when my hands stiffen up" - toothbrush sometimes fall out of pt's mouth/hand, "I make a mess." UB Dressing: ind, stiffness near neck so some difficulty with lifting arm  "it feels like a sharp  pinch, not painful." Pt has not attempted buttons/zippers. LB Dressing: ind, difficult d/t low back pain. Completes task sitting Toileting: ind Bathing: ind, standing though has shower chair available in the shower if pt gets dizzy, walk-in shower  Tub Shower transfers: no difficulty Equipment: Walk in shower  IADLs: Shopping: family assists, pt previously completed grocery shopping Light housekeeping: Pt reported "some of it I'm okay" (washing dishes, wiping counters, folding laundry) though family assists with sweeping and transferring clothes for laundry (pt experiences dizziness when bending at waist) Meal Prep: "I haven't really been eating." Pt reported last eating last night around 6 PM. Pt reported drinking water  for hydration.  Community mobility: drove self Medication management: family assists for set-up, uses medication organizer  Handwriting:  Pt wrote name and simple sentence with 100% legibility. No concerns noted. Pt sometimes stretched hand likely d/t stiffness. Pt reported "it hurts" across hand when holding pen.  Occupation: Middle Engineer, site. Difficulty with picking up objects, pt reported preference to work on "more like FM skills without it hurting so bad"   MOBILITY STATUS:  ind  POSTURE COMMENTS:  Sitting balance ind  ACTIVITY TOLERANCE: Activity tolerance: Pt reported nausea and dizziness if working on a task.  Pt reported nausea currently.  FUNCTIONAL OUTCOME MEASURES: PSFS: 3.6    12/29/23 - PSFS: 9.2   Total score = sum of the activity scores/number of activities Minimum detectable change (90%CI) for average score = 2 points Minimum detectable change (90%CI) for single activity score = 3 points   UPPER EXTREMITY ROM:    Active ROM Right eval Left eval  Shoulder flexion Slightly less AROM on R side d/t neck pain, though [facility]  Shoulder abduction    Shoulder adduction    Shoulder extension    Shoulder internal rotation    Shoulder  external rotation    Elbow flexion [facility] WFL  Elbow extension    Wrist flexion    Wrist extension    Wrist ulnar deviation    Wrist radial deviation    Wrist pronation    Wrist supination    (Blank rows = not tested)  BUE Composite digit flex/ext - WFL  UPPER EXTREMITY MMT:     MMT Right eval Left eval  Shoulder flexion    Shoulder abduction    Shoulder adduction    Shoulder extension    Shoulder internal rotation    Shoulder external rotation    Middle trapezius    Lower trapezius    Elbow flexion    Elbow extension    Wrist flexion    Wrist extension    Wrist ulnar deviation    Wrist radial deviation    Wrist pronation    Wrist supination    (Blank rows = not tested)  HAND FUNCTION: Grip strength: Right: 39.4, 24.9, 25.7 (30 lbs  average) lbs; Left: 39.4, 44.3, 47.6 (43.7 lbs average)  lbs  COORDINATION:  9-hole peg test: Right: 45 lbs; Left: 41 lbs  RUE: Pt demo'd difficulty with initial grasp of item. Pt paused to stretch out fingers 2x near end of task. Pt reported 7/10 pain level. Pt reported "the more I tried using it, the more it hurts in wrist."  LUE: Pt paused to stretch out fingers 5x. Pt reported pain of hand and pointed out over thenar eminence. Pt reported 4/10 pain level.  SENSATION: Pt denied tingling/numbness in B arm/shoulder/hands. Pt reported tingling in head "all the time" 10/10.   12/22/23 update - OT noted pt had several small cuts on B hands with bandages covering some cuts. Pt reported accidentally injuring hands when caring for animals. Pt reported she was not aware that the small cuts occurred at the time though notices the cuts now. OT questioning ?potential sensory involvement at BUE.  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 Pt reported needing to wear glasses all the time. Pt reported glasses help though pt reported difficulty tolerating pressure of glasses and pointed out under eye the glasses "doesn't even touch but it hurts here." Pt reported pain/pressure decreases when removing glasses.  Baseline vision: Wears glasses all the time Visual history: None noted  VISION ASSESSMENT: Not tested  PERCEPTION: Not tested  PRAXIS: Not tested  OBSERVATIONS: Pt was pleasant. Pt ambulated without A/E. When seated and not participating in conversation or a task, pt sometimes closed eyes and held her head in her hands. Pt benefited from low-light environment d/t headache pain. Pt often stretched out hands briefly during functional tasks involving grasp secondary to B hand pain based on pt report.                                                                                                                             TREATMENT DATE:    [facility] OT assessed pt's progress towards goals, see below for updates.   Pt c/o of difficulty with vision. Per pt "can't really see, it's blurry." OT recommended to pt to f/u with eye doctor. Pt acknowledged understanding. However, OT noted pt read 12 pt font today without difficulty (see below).  OT offered option for large print font for FM coordination HEP handout to prevent pain. However, pt requested standard 12 pt font for handout. Using line guide, pt read all lines on handout of 12 pt font and denied pain for duration. Pt politely declined breaks when offered. OT provided pt with both standard and large print font handouts for home use if symptoms vary day-to-day. Pt acknowledged understanding.  OT initiated FM coordination HEP (handout provided, see pt instructions) -  to improve B UE FM coordination, dexterity, proprioception. Pt returned demonstration of each exercise: Picking up objects and placing in a  container with slot Stacking towers of coins  Finger-to-palm then palm-to-finger translation of small items Shuffling cards Turning cards over 1 at a time Hold deck of cards in palm of hand and push off 1 card at a time from the top of the deck using only thumb Tear piece of tissue and rolling into small balls with fingertips Meditation balls - clockwise then counterclockwise Rolling pen between fingers and thumb, pen "twirl" (rotation), pen "shift" (translation)   Pt reported continued "stiffness" of RUE during functional tasks which improved by completing digit flex/ext with R hand.    PATIENT EDUCATION: Education details: see today's tx above Person educated: Patient Education method: Explanation Education comprehension: verbalized understanding  HOME EXERCISE PROGRAM: 12/22/23 - tendon glides, sleep hygiene, energy conservation, sensory safety precautions (see pt instructions) 12/25/23 - pink theraputty HEP, Access Code: [email or web address]   GOALS: Goals reviewed with patient? Yes  SHORT TERM GOALS: Target date: 01/02/24  Pt will be ind with initial HEP using visual handouts. Baseline: new to outpt OT 12/22/23 - tendon glides issued, pt returned demo. 12/29/23 - FM coordination HEP initiated. Pt returned demo of FM coordination HEP. Goal status: in progress  2.  Pt will ind recall at least 3 energy conservation strategies. Baseline: pt reported fatigue, symptoms of nausea and dizziness when completing functional tasks 12/29/23 - Pt ind recalled using reacher, positioning of body during functional tasks to avoid increasing symptoms, using line reader, completing exercises, and taking breaks PRN.  Goal status: MET  3.  Pt will demo understanding of adaptive strategies and A/E to reduce pain and improve efficiency with ADLs/IADLs. Baseline: Pt reported difficulty with UB/LB dressing d/t pain, difficulty with sweeping and laundry d/t symptoms of nausea and dizziness, no appetite for food and  not eating as often. 12/29/23 - Pt ind recalled using reacher to pick up objects, using line reader for reading, completing tasks seated (e.g. squatting when gardening).  Goal status: MET  4.  Pt will recall at least 3 strategies to reduce pain/stiffness of BUE. Baseline: Pt reported pain/stiffness of B hands during functional tasks. 12/29/23 - Pt ind recalled completing exercises (e.g. theraputty, tendon glides), ice packs and heat to manage pain/stiffness.  Goal status: MET  LONG TERM GOALS: Target date: 01/16/24  Patient will report at least two-point increase in average PSFS score or at least three-point increase in a single activity score indicating functionally significant improvement given minimum detectable change. Baseline: PSFS: 3.6 total score (See above for individual activity scores)  12/29/23 - PSFS: 9.2 Goal status: MET  2.  Patient will maintain at least  70 lbs RUE grip strength across all 3 trials as needed to grasp toothbrush and writing utensils. Baseline: Grip strength: Right: 39.4, 24.9, 25.7 (30 lbs average) lbs; Left: 39.4, 44.3, 47.6 (43.7 lbs average)  lbs 12/29/23 -  RUE: 70.9, 65.9, 67.9 (68.2 lbs average) LUE: 72.0, 70.3, 70.9 (71.1 lbs average) Goal status: MET and revised 12/29/23  3.  Patient will demo maintained FM coordination as evidenced by completing nine-hole peg with use of RUE in  20 seconds or less.  Baseline: 9-hole peg test: Right: 45 lbs; Left: 41 lbs 12/29/23 - RUE: 20 seconds. LUE: 23 seconds. Goal status: MET and revised 12/29/23  ASSESSMENT:  CLINICAL IMPRESSION: Pt met 3 out of 4 STG and 3 out of 3 LTG today, demo'ing great progress  towards goals today. Pt also reported decreased pain and fatigue symptoms over past several days. Pt demo'd good carryover of education related to adaptive strategies to manage symptoms if symptoms recur. OT noted pt demo'd faster response times during conversation as well. OT to monitor symptoms and pt's ability to  maintain grip strength and coordination of BUE until next OT visit. Pt to return in approx. 2 weeks to allow for monitoring of symptoms, monitoring of pt's ability to maintain recent progress demo'd today, and for education related to potential return to work considerations.  Pt would benefit from skilled OT services in the outpatient setting to work on impairments as noted below.  PERFORMANCE DEFICITS: in functional skills including ADLs, IADLs, coordination, dexterity, proprioception, sensation, ROM, strength, pain, flexibility, Fine motor control, Gross motor control, mobility, body mechanics, endurance, vision, and UE functional use, cognitive skills including attention and energy/drive, and psychosocial skills including environmental adaptation.   IMPAIRMENTS: are limiting patient from ADLs, IADLs, rest and sleep, work, leisure, and social participation.   CO-MORBIDITIES: may have co-morbidities  that affects occupational performance. Patient will benefit from skilled OT to address above impairments and improve overall function.  MODIFICATION OR ASSISTANCE TO COMPLETE EVALUATION: Min-Moderate modification of tasks or assist with assess necessary to complete an evaluation.  OT OCCUPATIONAL PROFILE AND HISTORY: Detailed assessment: Review of records and additional review of physical, cognitive, psychosocial history related to current functional performance.  CLINICAL DECISION MAKING: Moderate - several treatment options, min-mod task modification necessary  REHAB POTENTIAL: Good  EVALUATION COMPLEXITY: Moderate    PLAN:  OT FREQUENCY: 1-2x/week  OT DURATION: 3 weeks   PLANNED INTERVENTIONS: 97168 OT Re-evaluation, 97535 self care/ADL training, 16109 therapeutic exercise, 97530 therapeutic activity, 97112 neuromuscular re-education, 97140 manual therapy, 97035 ultrasound, 97018 paraffin, 60454 fluidotherapy, 97010 moist heat, 97010 cryotherapy, passive range of motion, functional  mobility training, visual/perceptual remediation/compensation, energy conservation, patient/family education, and DME and/or AE instructions  RECOMMENDED OTHER SERVICES: PT eval completed, SLP eval scheduled  CONSULTED AND AGREED WITH PLAN OF CARE: Patient  PLAN FOR NEXT SESSION:   LTG: Assess 9-hole peg and grip strength revised goals (monitor - any significant changes?)  Monitor symptoms - how have symptoms been for past several days/weeks? (Per pt report: Symptoms greatly improved approx. 12/27/23 through most recent OT visit 12/29/23)  Potential Return to work considerations?     Oakley Bellman, OT 12/29/2023, 3:09 PM

## 2023-12-29 NOTE — Therapy (Signed)
 OUTPATIENT PHYSICAL THERAPY VESTIBULAR TREATMENT  Patient Name: Theresa [name] MRN: [id number] DOB:Jul 08, 1971, 53 y.o., female Today's Date: 12/29/2023  END OF SESSION:  PT End of Session - 12/29/23 1307     Visit Number 6    Number of Visits 7    Date for PT Re-Evaluation 01/15/24    Authorization Type [employer]    PT Start Time 1400    PT Stop Time 1440    PT Time Calculation (min) 40 min    Activity Tolerance Patient tolerated treatment well    Behavior During Therapy WFL for tasks assessed/performed              Past Medical History:  Diagnosis Date   Anemia    Anxiety    Attention deficit disorder    Bipolar disorder (HCC)    currently no meds- agitation noted during PAT assessment   Depression    GERD (gastroesophageal reflux disease)    resolved   H/O transfusion    17 yrs ago postpartum   Hyperlipidemia    resolved   Hypertension    history of HTN prior to gastric bypass   Hypoglycemia    Left breast abscess 01/22/2017   Sleep apnea, obstructive    cpap used auto settings, resolved gastric bypass   Past Surgical History:  Procedure Laterality Date   ABDOMINAL HYSTERECTOMY     ABDOMINOPLASTY     AUGMENTATION MAMMAPLASTY Bilateral 2016   BIOPSY  02/13/2022   Procedure: BIOPSY;  Surgeon: Urban Garden, MD;  [facility]: [facility];  Service: Gastroenterology;;   BREAST BIOPSY Left 01/2017   abscess removed   CESAREAN SECTION     COLON SURGERY     COLONOSCOPY WITH PROPOFOL  N/A 07/04/2021   Procedure: COLONOSCOPY WITH PROPOFOL ;  Surgeon: Urban Garden, MD;  [facility]: [facility];  Service: Gastroenterology;  Laterality: N/A;  2:00 pm   ESOPHAGOGASTRODUODENOSCOPY (EGD) WITH PROPOFOL  N/A 02/13/2022   Procedure: ESOPHAGOGASTRODUODENOSCOPY (EGD) WITH PROPOFOL ;  Surgeon: Urban Garden, MD;  [facility]: [facility];  Service: Gastroenterology;  Laterality: N/A;  815   GASTRIC ROUX-EN-Y N/A 12/06/2013   Procedure:  LAPAROSCOPIC ROUX-EN-Y GASTRIC BYPASS WITH UPPER ENDOSCOPY;  Surgeon: Fran Imus, MD;  Location: [facility];  Service: General;  Laterality: N/A;   INCISION AND DRAINAGE ABSCESS Left 01/22/2017   Procedure: INCISION AND DRAINAGE LEFT  BREAST ABSCESS;  Surgeon: Boyce Byes, MD;  Location: [facility];  Service: General;  Laterality: Left;   LAPAROTOMY N/A 02/18/2017   Procedure: LAPAROTOMY;  Surgeon: Woodrow Hazy, MD;  Location: [facility];  Service: Gynecology;  Laterality: N/A;   POLYPECTOMY  07/04/2021   Procedure: POLYPECTOMY;  Surgeon: Urban Garden, MD;  [facility]: [facility];  Service: Gastroenterology;;   SALPINGOOPHORECTOMY Bilateral 02/18/2017   Procedure: BILATERAL SALPINGO OOPHORECTOMY;  Surgeon: Woodrow Hazy, MD;  Location: [facility];  Service: Gynecology;  Laterality: Bilateral;   TUBAL LIGATION     UNILATERAL SALPINGECTOMY Right 09/05/2015   Procedure: UNILATERAL SALPINGECTOMY;  Surgeon: Woodrow Hazy, MD;  Location: [facility];  Service: Gynecology;  Laterality: Right;   VAGINAL HYSTERECTOMY N/A 09/05/2015   Procedure: HYSTERECTOMY VAGINAL;  Surgeon: Woodrow Hazy, MD;  Location: [facility];  Service: Gynecology;  Laterality: N/A;   WISDOM TOOTH EXTRACTION     Patient Active Problem List   Diagnosis Date Noted   Barrett's esophagus without dysplasia 04/25/2023   Heartburn 04/21/2023   Drug-induced liver injury 04/25/2022   IBS (irritable colon syndrome) 04/25/2022  Nausea with vomiting 12/24/2021   Lymphadenopathy 12/08/2019   Left breast lump 12/08/2019   Insomnia 11/09/2019   History of benign adrenal tumor 01/08/2019   Abdominal pain, chronic, left lower quadrant (Primary Area of Pain) 07/27/2018   Chronic left-sided low back pain (Secondary Area of Pain) 07/27/2018   Chronic pain syndrome 07/27/2018   Pharmacologic therapy 07/27/2018   Disorder of skeletal system 07/27/2018   Problems influencing health status 07/27/2018   Hypoglycemia  after GI (gastrointestinal) surgery 04/10/2018   Estrogen deficiency 07/01/2017   Lower abdominal pain 06/03/2017   Alcohol use    Bipolar affect, depressed (HCC) 03/05/2017   Chronic post-traumatic stress disorder (PTSD) 03/05/2017   Bipolar affective disorder, current episode mixed (HCC) 03/05/2017   Neuropathic pain 02/18/2017   Adenomyosis 09/05/2015   Anemia 04/20/2015   Hyperglycemia 08/18/2014   S/P gastric bypass 12/06/2013   GERD (gastroesophageal reflux disease) 10/13/2013   Essential hypertension 10/02/2013   Hyperlipidemia 01/05/2013    PCP: Ezell Hollow, NP REFERRING PROVIDER: Rodgers Clack, DO  REFERRING DIAG: S06.0X9A (ICD-10-CM) - Concussion with loss of consciousness, initial encounter F07.81 (ICD-10-CM) - Postconcussive syndrome  THERAPY DIAG:  Dizziness and giddiness  Unsteadiness on feet  Muscle weakness (generalized)  Other lack of coordination  ONSET DATE: 12/09/2023 (referral date)  Rationale for Evaluation and Treatment: Rehabilitation  SUBJECTIVE:   SUBJECTIVE STATEMENT: Patient reports doing well. Started building a raised garden bed using a sledgehammer and lifting ~50lbs repeatedly. Did have 1 fall in the yard stepping into a hole. Did have to call someone to pick her up from her last appt due to eye strain/fatigue.   Pt accompanied by: self - drove self  PERTINENT HISTORY: PTSD, bipolar disorder, chronic back pain and pain disorder  PAIN:  Are you having pain? No  PRECAUTIONS: Fall  PATIENT GOALS: "To be able to move without pain."   OBJECTIVE:  Note: Objective measures were completed at Evaluation unless otherwise noted.  DIAGNOSTIC FINDINGS:   CT Head wo contrast 12/03/2023: IMPRESSION: 1. No evidence of acute intracranial abnormality. 2. Left-sided sinusitis.                                                TREATMENT: NMR: Ami Kail Chart Decoding plain background   -demonstrates eye fatigue after ~7mins of sustained work   -progressed to [employer] with pattern background and demonstrated no eye fatigue/ strain -picking squigz off the floor and placing on mirror to simulate pulling weeds in garden  -standing on foam trunk twist moving squigz from 1 mirror to the other   -progressed to dual cog task with no issue  -standing on foam scarf toss with vertical smooth pursuits/visual tracking  PATIENT EDUCATION: Education details: PT POC, continue HEP Person educated: Patient Education method: Explanation, Demonstration, and Handouts Education comprehension: verbalized understanding and needs further education  HOME EXERCISE PROGRAM:  -[address] string  -saccades   Sitting, slowly bend head down and pick up object on the floor. Return to upright position. Hold position until symptoms subside. Repeat __3__ times per session. Do __3__ sessions per day. Head Motion: Side to Side    Sitting, tilt head down 15-30, slowly move head to right with eyes open. Hold position until symptoms subside. Then, move head slowly to opposite side. Hold position until symptoms subside. Repeat __10__ times per session. Do  ___3_ sessions per day. Head Motion: Up and Down    Sitting, slowly move head up with eyes open. Hold position until symptoms subside. Then, move head in opposite direction. Hold position until symptoms subside. Repeat __10__ times per session. Do __3__ sessions per day. Gaze Stabilization: Sitting    Keeping eyes on target on wall 5 feet away, tilt head down 15-30 and move head side to side for _30___ seconds. Repeat while moving head up and down for __30__ seconds. Do __3__ sessions per day.  Access Code: [email or web address] URL: [email or web address] Date: 12/19/2023 Prepared by: Nickola Baron  Exercises - Seated Slump Nerve Glide  - 1 x daily - 7 x weekly - 3 sets - 10 reps  Access Code: [email or web address] URL: [email or web address] Date: 12/22/2023 Prepared by: Camella Cave  Exercises - Corner Balance Feet Together: Eyes Closed With Head Turns  - 1 x daily - 7 x weekly - 2-3 sets - 10 reps  GOALS: Goals reviewed with patient? Yes  LONG TERM GOALS: Target date: 01/15/2024  Patient will report demonstrate independence with final HEP in order to maintain current gains and continue to progress after physical therapy discharge.   Baseline: To be provided  Goal status: INITIAL  2.  Patient will improve RPQ-13 score to </= 35 per item to indicate reduced severity of post-concussive symptoms to progress towards PLOF.   Baseline: 43 Goal status: INITIAL  3.  Patient will improve RPQ-3 score to </= 7 per item to indicate reduced severity of post-concussive symptoms to progress towards PLOF.   Baseline: 11 Goal status: INITIAL  4.  Patient will report self-written in score on RPQ for neck pain as =< 3/4 to demonstrate improved cervical function.  Baseline: 4/4 Goal status: INITIAL  5.  Pt will report </= 2/5 for all movements on MSQ to indicate improvement in motion sensitivity and improved activity tolerance.   Baseline: 4+/5 Goal status: REVISED  6.  mCTSIB to be assessed / LTG written Baseline: 120/120 Goal status: D/C - 120/120  ASSESSMENT:  CLINICAL IMPRESSION: Patient seen for skilled PT session with emphasis on vestibular retraining. PT unable to truly reproduce patients dizziness today. She demonstrated significant improvements in dual tasking, visual scanning, bending down, smooth pursuits, saccades and general endurance to therapeutic tasks. Tolerated entirety of session in open, bright, loud gym without issue. Discussed cancelling Thursdays appt and scheduling one additional in a few weeks to assess for more complete resolution of symptoms. Patient agreeable. Continue POC.   OBJECTIVE IMPAIRMENTS: decreased balance, dizziness, and pain.   ACTIVITY LIMITATIONS: lifting, standing, sleeping, reach over head, and caring for  others  PARTICIPATION LIMITATIONS: meal prep, laundry, community activity, and occupation  PERSONAL FACTORS: Age, Time since onset of injury/illness/exacerbation, and 1-2 comorbidities: see above  are also affecting patient's functional outcome.   REHAB POTENTIAL: Fair highly symptomatic and long history of chronic low back pain  CLINICAL DECISION MAKING: Evolving/moderate complexity  EVALUATION COMPLEXITY: Moderate   PLAN:  PT FREQUENCY: 2x/week (ideally but may have to modify frequency given copay)  PT DURATION: 3 weeks (anticipate may   PLANNED INTERVENTIONS: 97164- PT Re-evaluation, 97110-Therapeutic exercises, 97530- Therapeutic activity, 97112- Neuromuscular re-education, 97535- Self Care, 54098- Manual therapy, and 97116- Gait training  PLAN FOR NEXT SESSION:  Not planning on addressing back pain as long term chronic issue - may need to refer out,  holding in [place] Concussion given severity of symptoms at this time  Progress out to gym as tolerated, add busy background or  dynamic tasks, work on bending over and vertical fast head turns   Rebecca Campus, PT Rebecca Campus, PT, DPT, CBIS  12/29/2023, 2:46 PM

## 2023-12-31 ENCOUNTER — Ambulatory Visit: Admitting: Speech Pathology

## 2023-12-31 ENCOUNTER — Encounter: Payer: Self-pay | Admitting: Speech Pathology

## 2023-12-31 ENCOUNTER — Other Ambulatory Visit: Payer: Self-pay

## 2023-12-31 DIAGNOSIS — R42 Dizziness and giddiness: Secondary | ICD-10-CM | POA: Diagnosis not present

## 2023-12-31 DIAGNOSIS — R41841 Cognitive communication deficit: Secondary | ICD-10-CM

## 2023-12-31 NOTE — Therapy (Signed)
 OUTPATIENT SPEECH LANGUAGE PATHOLOGY EVALUATION   Patient Name: Theresa [name] MRN: [id number] DOB:May 31, 1971, 53 y.o., female Today's Date: 12/31/2023  PCP: Ezell Hollow, NP REFERRING PROVIDER: Rodgers Clack, DO  END OF SESSION:  End of Session - 12/31/23 0933     Visit Number 1    Number of Visits 6    Date for SLP Re-Evaluation 03/10/24   due to scheduling wait   Authorization Type no    SLP Start Time 0845    SLP Stop Time  0930    SLP Time Calculation (min) 45 min    Activity Tolerance Patient tolerated treatment well             Past Medical History:  Diagnosis Date   Anemia    Anxiety    Attention deficit disorder    Bipolar disorder (HCC)    currently no meds- agitation noted during PAT assessment   Depression    GERD (gastroesophageal reflux disease)    resolved   H/O transfusion    17 yrs ago postpartum   Hyperlipidemia    resolved   Hypertension    history of HTN prior to gastric bypass   Hypoglycemia    Left breast abscess 01/22/2017   Sleep apnea, obstructive    cpap used auto settings, resolved gastric bypass   Past Surgical History:  Procedure Laterality Date   ABDOMINAL HYSTERECTOMY     ABDOMINOPLASTY     AUGMENTATION MAMMAPLASTY Bilateral 2016   BIOPSY  02/13/2022   Procedure: BIOPSY;  Surgeon: Urban Garden, MD;  [facility]: [facility];  Service: Gastroenterology;;   BREAST BIOPSY Left 01/2017   abscess removed   CESAREAN SECTION     COLON SURGERY     COLONOSCOPY WITH PROPOFOL  N/A 07/04/2021   Procedure: COLONOSCOPY WITH PROPOFOL ;  Surgeon: Urban Garden, MD;  [facility];  Service: Gastroenterology;  Laterality: N/A;  2:00 pm   ESOPHAGOGASTRODUODENOSCOPY (EGD) WITH PROPOFOL  N/A 02/13/2022   Procedure: ESOPHAGOGASTRODUODENOSCOPY (EGD) WITH PROPOFOL ;  Surgeon: Urban Garden, MD;  [facility];  Service: Gastroenterology;  Laterality: N/A;  815   GASTRIC ROUX-EN-Y N/A  12/06/2013   Procedure: LAPAROSCOPIC ROUX-EN-Y GASTRIC BYPASS WITH UPPER ENDOSCOPY;  Surgeon: Fran Imus, MD;  Location: [facility];  Service: General;  Laterality: N/A;   INCISION AND DRAINAGE ABSCESS Left 01/22/2017   Procedure: INCISION AND DRAINAGE LEFT  BREAST ABSCESS;  Surgeon: Boyce Byes, MD;  Location: [facility];  Service: General;  Laterality: Left;   LAPAROTOMY N/A 02/18/2017   Procedure: LAPAROTOMY;  Surgeon: Woodrow Hazy, MD;  Location: [facility];  Service: Gynecology;  Laterality: N/A;   POLYPECTOMY  07/04/2021   Procedure: POLYPECTOMY;  Surgeon: Urban Garden, MD;  [facility];  Service: Gastroenterology;;   SALPINGOOPHORECTOMY Bilateral 02/18/2017   Procedure: BILATERAL SALPINGO OOPHORECTOMY;  Surgeon: Woodrow Hazy, MD;  Location: [facility];  Service: Gynecology;  Laterality: Bilateral;   TUBAL LIGATION     UNILATERAL SALPINGECTOMY Right 09/05/2015   Procedure: UNILATERAL SALPINGECTOMY;  Surgeon: Woodrow Hazy, MD;  Location: [facility];  Service: Gynecology;  Laterality: Right;   VAGINAL HYSTERECTOMY N/A 09/05/2015   Procedure: HYSTERECTOMY VAGINAL;  Surgeon: Woodrow Hazy, MD;  Location: [facility];  Service: Gynecology;  Laterality: N/A;   WISDOM TOOTH EXTRACTION     Patient Active Problem List   Diagnosis Date Noted   Barrett's esophagus without dysplasia 04/25/2023   Heartburn 04/21/2023   Drug-induced liver injury 04/25/2022  IBS (irritable colon syndrome) 04/25/2022   Nausea with vomiting 12/24/2021   Lymphadenopathy 12/08/2019   Left breast lump 12/08/2019   Insomnia 11/09/2019   History of benign adrenal tumor 01/08/2019   Abdominal pain, chronic, left lower quadrant (Primary Area of Pain) 07/27/2018   Chronic left-sided low back pain (Secondary Area of Pain) 07/27/2018   Chronic pain syndrome 07/27/2018   Pharmacologic therapy 07/27/2018   Disorder of skeletal system 07/27/2018   Problems influencing health status  07/27/2018   Hypoglycemia after GI (gastrointestinal) surgery 04/10/2018   Estrogen deficiency 07/01/2017   Lower abdominal pain 06/03/2017   Alcohol use    Bipolar affect, depressed (HCC) 03/05/2017   Chronic post-traumatic stress disorder (PTSD) 03/05/2017   Bipolar affective disorder, current episode mixed (HCC) 03/05/2017   Neuropathic pain 02/18/2017   Adenomyosis 09/05/2015   Anemia 04/20/2015   Hyperglycemia 08/18/2014   S/P gastric bypass 12/06/2013   GERD (gastroesophageal reflux disease) 10/13/2013   Essential hypertension 10/02/2013   Hyperlipidemia 01/05/2013    ONSET DATE: 11/28/23   REFERRING DIAG: Z61.0X9A (ICD-10-CM) - Concussion with loss of consciousness, initial encounter  THERAPY DIAG:  Cognitive communication deficit  Rationale for Evaluation and Treatment: Rehabilitation  SUBJECTIVE:   SUBJECTIVE STATEMENT: "I had trouble articulating and was stuttering" Pt accompanied by: self  PERTINENT HISTORY: Patient reports that she fell and hit her face on the counter and back of head on toilet with loss of consciousness for about a minute or two; she was on a muscle relaxor for back pain at time of injury. Patient was also throwing up after the injury. She had the accident on Saturday, March 21-22nd. Patient reports since the accident she has had really bad headaches, light sensitivity, twitching eye, difficulty getting words out, balance, and pain in head and neck as well as low back. Patient denies previous concussions. Patient reports that her headaches are constant since accident but are really bad following activity and last for several hours. Patient is not currently on any medications for headaches to her knowledge.  PAIN:  Are you having pain? No  FALLS: Has patient fallen in last 6 months?  See PT evaluation for details  LIVING ENVIRONMENT: Lives with: lives with their spouse Lives in: House/apartment  PLOF:  Level of assistance: Independent with  ADLs, Independent with IADLs Employment: Full-time employment  PATIENT GOALS: "being able to spit it out"  OBJECTIVE:  Note: Objective measures were completed at Evaluation unless otherwise noted.   COGNITION: Overall cognitive status: Impaired Areas of impairment:  Attention: Impaired: Alternating, Divided Memory: Impaired: Working Hotel manager deficits:   AUDITORY COMPREHENSION: Overall auditory comprehension: Appears intact YES/NO questions: Appears intact Following directions: Appears intact Conversation: Moderately Complex Interfering components: attention Effective technique: extra processing time  READING COMPREHENSION: Intact  EXPRESSION: verbal  VERBAL EXPRESSION: Level of generative/spontaneous verbalization: conversation Automatic speech: name: intact and social response: intact  Repetition: Appears intact Naming: Confrontation: 76-100% and Divergent: 76-100% Pragmatics: Appears intact Comments:  Interfering components: attention Effective technique:  N/A Non-verbal means of communication: N/A  WRITTEN EXPRESSION: Dominant hand: right Written expression: Appears intact  MOTOR SPEECH: Overall motor speech: impaired Initially mild stutter and slur - she reports more severe stutter after her accident Diadochokinesis: 1 syllable - WFL; 2&3 syllables mild stutter/groping and halting Rapid Alternating Speech tasks - WFL Conversation: mild stutter, halting/groping improved as evaluation progressed to WNL speech.    ORAL MOTOR EXAMINATION: Overall status: WFL Comments:    PATIENT REPORTED OUTCOME MEASURES (  PROM): Communication Effectiveness Survey: 19/32 - she rated a 2 for items 1-5, a 3 for difficulty speaking when upset, conversing in the car, conversing across a room                                                                                                                            TREATMENT DATE:   12/31/23: Eval completed  - Initiated training in compensations for attention and memory including writing daily and weekly priorities, have a set place for phone, keys, wallet; set alarm on phone for meds and don't turn it off until you have taken the meds; set alarm for laundry to switch from washer to dryer; repeat back what you have heard and don't multi task when information is important - look at the person you are talking to. She verbalizes energy conservation with rare min questioning cues.    PATIENT EDUCATION: Education details: See Treatment; See Patient Instructions; compensations for cognitive impairment Person educated: Patient Education method: Explanation, Verbal cues, and Handouts Education comprehension: verbalized understanding, verbal cues required, and needs further education   GOALS: Goals reviewed with patient? Yes    LONG TERM GOALS: Target date: 03/10/24  Pt will carryover use of alarm and 1 other external aid to  Baseline:  Goal status: INITIAL  2.  Pt will carryover 2 strategies to loose items 3 or less times a week Baseline:  Goal status: INITIAL  3.  Pt will carryover 2 compensatory strategies to support slow processing and forming thoughts in conversation with mod I Baseline:  Goal status: INITIAL  4.  Pt will carryover 2 strategies to recall what she has read for school  Baseline:  Goal status: INITIAL   CLINICAL IMPRESSION: Patient is a 53 y.o. female who was seen today for mild high level cognitive communication impairment. 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 She reports losing items frequently, forgetting what she reads and forgetting conversations. She also report forgetting clothes in the washer. Rena is having difficulty recalling am meds which she keeps in an organizer on her vanity but forgets to go back to get meds after she eats. I recommend short course of skilled ST to  maximize cognition for safety, success upon eventual return to work and return to PLOF  OBJECTIVE IMPAIRMENTS: include attention, memory, and dysarthria. These impairments are limiting patient from return to work, managing medications, ADLs/IADLs, and effectively communicating at home and in community. Factors affecting potential to achieve goals and functional outcome are  n/a . Patient will benefit from skilled SLP services to address above impairments and improve overall function.  REHAB POTENTIAL: Good  PLAN:  SLP FREQUENCY: 1x/week  SLP DURATION: 8 weeks  PLANNED INTERVENTIONS: Language facilitation, Cognitive reorganization, Internal/external aids, Functional tasks, Multimodal communication approach, SLP instruction and feedback, and Compensatory strategies    Jack Mineau, Dareen Ebbing, CCC-SLP 12/31/2023, 11:19 AM

## 2023-12-31 NOTE — Patient Instructions (Addendum)
   Write weekly and daily priorities -   Keep a daily to do list  Cross off after you finish  Set alarm on your phone at 7:30 to take meds - do not turn it off until you take the meds - snooze only  Same for laundry - set alarm on your phone  Have a set place for your keys, wallet and phone  When you set them down, say aloud "I'm setting my keys on the kitchen table" or "I'm laying my phone on my bed"  Write down your mental check list of items you need - yes, keep them all together in the chair - don't take it to the car until you have completed your mental check list  Give yourself extra time to get out of the house - so you don't rush and forget things  Repeat back what you have heard to make sure you attended to all of the details

## 2024-01-01 ENCOUNTER — Ambulatory Visit: Admitting: Physical Therapy

## 2024-01-01 ENCOUNTER — Ambulatory Visit: Admitting: Occupational Therapy

## 2024-01-05 ENCOUNTER — Ambulatory Visit: Admitting: Family Medicine

## 2024-01-07 ENCOUNTER — Other Ambulatory Visit: Payer: Self-pay

## 2024-01-07 NOTE — Telephone Encounter (Signed)
 Refills sent already,  no need

## 2024-01-07 NOTE — Progress Notes (Signed)
 Patient advised.  Patient wants to know I she should contiue the Depakote?

## 2024-01-08 ENCOUNTER — Ambulatory Visit (INDEPENDENT_AMBULATORY_CARE_PROVIDER_SITE_OTHER): Admitting: Family Medicine

## 2024-01-08 VITALS — BP 124/86 | Ht 64.0 in | Wt 150.0 lb

## 2024-01-08 DIAGNOSIS — S060X9D Concussion with loss of consciousness of unspecified duration, subsequent encounter: Secondary | ICD-10-CM | POA: Diagnosis not present

## 2024-01-08 MED ORDER — DICLOFENAC SODIUM 75 MG PO TBEC: 75.0000 mg | DELAYED_RELEASE_TABLET | Freq: Two times a day (BID) | ORAL | 1 refills | Status: AC

## 2024-01-08 NOTE — Patient Instructions (Signed)
 You're improving well from your concussion. Transition to home exercise program from PT/OT. Continue vestibular therapy and doing home exercises on days you don't go to therapy. Take diclofenac  75mg  twice a day with food as needed for headaches. 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  Stay out of noisy areas.  Hopefully they have something available with those restrictions. Follow up in 2-4 weeks (2 weeks if you're feeling great, 3-4 weeks if progress continues to be slow - this is the more expected time frame for how bad your concussion was).

## 2024-01-08 NOTE — Progress Notes (Signed)
 DATE OF VISIT: 01/08/2024        Theresa [name] DOB: 11-24-1970 MRN: [id number]  CC: Concussion follow-up  HPI:  Theresa [name] returns for follow up from her 4/1 visit for concussion. Initial injury occurred on 11/29/2023. In the interim she has been going to PT/OT and doing well - set to be discharged next week to home exercise program. Doing vestibular rehab and home exercises - feels her balance has improved. Major issues are surrounding her memory and feeling she cannot articulate as well related to that. She has history of migraine headaches - now taking emgality . Continues to take topamax  but at lower 25mg  dose as told higher dose may exacerbate symptoms with her recent concussion. Taking gabapentin . Methocarbamol  for her low back spasms rarely. She is out of diclofenac  though this was helping her headaches as well. 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   Able to drive but not at nighttime. There were some concerns since last visit of her having bowel/bladder dysfunction - she saw her neurologist and had MRIs of cervical and lumbar spine that were reassuring (underlying degenerative changes though).  MEDICATIONS:    Current Outpatient Medications:    ALPRAZolam  (XANAX ) 0.5 MG tablet, Take 0.5 mg by mouth daily as needed for anxiety., Disp: , Rfl:    buPROPion  (WELLBUTRIN  SR) 150 MG 12 hr tablet, TAKE 1 TABLET BY MOUTH TWICE A DAY (Patient taking differently: Take 150 mg by mouth daily.), Disp: 180 tablet, Rfl: 1   diclofenac  (VOLTAREN ) 75 MG EC tablet, Take 1 tablet (75 mg total) by mouth 2 (two) times daily., Disp: 60 tablet, Rfl: 0   dicyclomine  (BENTYL ) 20 MG tablet, Take 1/2 tablet (10 mg total) by mouth every 12 (twelve) hours as needed (abdominal pain)., Disp: 90 tablet, Rfl: 1   esomeprazole  (NEXIUM ) 40 MG capsule, Take 1 capsule (40 mg total) by mouth 2 (two) times daily before a meal.,  Disp: 30 capsule, Rfl: 11   estradiol  (ESTRACE ) 1 MG tablet, Take 1 tablet (1 mg total) by mouth 2 (two) times daily., Disp: 180 tablet, Rfl: 0   gabapentin  (NEURONTIN ) 600 MG tablet, TAKE 1 TABLET BY MOUTH IN THE MORNING TAKE 1 TABLET IN THE EVENING AND TAKE 2 TABLETS AT BEDTIME (Patient taking differently: Take 600-1,200 mg by mouth See admin instructions. TAKE  600 MG BY MOUTH IN THE MORNING AND AT BEDTIME, 1200 MG AT LUNCHTIME), Disp: 120 tablet, Rfl: 3   Galcanezumab -gnlm (EMGALITY ) 120 MG/ML SOAJ, Inject 120 mg into the skin every 28 (twenty-eight) days., Disp: 3 mL, Rfl: 2   lisinopril  (ZESTRIL ) 20 MG tablet, Take 20 mg by mouth daily., Disp: , Rfl:    lubiprostone  (AMITIZA ) 24 MCG capsule, Take 1 capsule (24 mcg total) by mouth 2 (two) times daily with a meal., Disp: 180 capsule, Rfl: 3   methocarbamol  (ROBAXIN ) 500 MG tablet, Take 1 tablet (500 mg total) by mouth 3 (three) times daily., Disp: 60 tablet, Rfl: 1   metoprolol  succinate (TOPROL -XL) 25 MG 24 hr tablet, TAKE 1 TABLET BY MOUTH EVERY DAY, Disp: 90 tablet, Rfl: 0   ondansetron  (ZOFRAN ) 4 MG tablet, Take 1 tablet (4 mg total) by mouth every 8 (eight) hours as needed for nausea or vomiting., Disp: 90 tablet, Rfl: 1   ondansetron  (ZOFRAN -ODT) 4 MG disintegrating tablet, Take 1 tablet (4 mg total) by mouth every 8 (eight) hours as needed for nausea or vomiting., Disp:  20 tablet, Rfl: 0   rizatriptan  (MAXALT -MLT) 10 MG disintegrating tablet, Take 1 tablet (10 mg total) by mouth as needed for migraine. May repeat in 2 hours if needed. Maximum 2 tablets in 24 hours., Disp: 10 tablet, Rfl: 5   sertraline (ZOLOFT) 50 MG tablet, Take 50 mg by mouth daily., Disp: , Rfl:    topiramate  (TOPAMAX ) 25 MG tablet, Take 1 tablet (25 mg total) by mouth at bedtime., Disp: 30 tablet, Rfl: 5   traZODone  (DESYREL ) 100 MG tablet, Take 50 mg by mouth at bedtime., Disp: , Rfl:   ALLERGIES:   Allergies  Allergen Reactions   Other Hives    Needs benadryl  when  given narcotics     PHYSICAL  EXAMINATION BP 124/86   Ht 5\' 4"  (1.626 m)   Wt 150 lb (68 kg)   LMP  (LMP Unknown) Comment: AUB  BMI 25.75 kg/m  GENERAL: AOx3, no acute distress, sitting comfortable in exam room  NEURO: CN II-XII grossly intact Neg Romberg, Normal gait  SCAT 6 Score: Total symptoms: 15/22 Symptom score: 33/132  Headache    4/(0-6) "Pressure in head"  3/(0-6) Neck Pain   3/(0-6) Nausea or vomiting  1/(0-6) Dizziness   1/(0-6) Blurred vision   2/(0-6) Balance problems   0/(0-6) Sensitivity to light  2/(0-6) Sensitivity to noise  0/(0-6) Feeling slowed down  2/(0-6) Feeling "like in a fog"  1/(0-6) "Don't feel right"  1/(0-6) Difficulty concentrating  3/(0-6) Difficulty remembering 5/(0-6) Fatigue or low energy  2/(0-6) Confusion   0/(0-6) Drowsiness   1/(0-6) More emotional  0/(0-6) Irritability   2/(0-6) Sadness   0/(0-6) Nervous or Anxious  0/(0-6) Trouble falling asleep  0/(0-6)  Do the symptoms get worse with physical activity? no Do the symptoms get worse with mental activity?  yes           Previous SCAT score:  22/22 symptoms, 82/132 severity score on 4/1  STANDARDIZED ASSESSMENT OF CONCUSSION [facility]): Orientation What month is it? 1/1. What is the date today? 1/1. What is the day of the week? 1/1. What year is it? 1/1. What time is it right now (within one hour)? 1/1. Orientation score: 5 /5  Immediate memory (3 trials) List A  List B  List C Jacket  Finger xxx Baby Arrow  Penny xxx Monkey Pepper  Blanket xx Perfume Cotton  Lemon x [employer] xx Iron Dollar  Candle xx Elbow Honey  Paper  Community education officer  Sugar xx Carpet Saddle  Sandwich xx Saddle Anchor  Wagon xxx Bubble  First time:  6/10. Second time:  7/10. Third time:  7/10. Immediate memory score:  20/30.  Concentration (Digits Backward) 4-9-3   6-2-9   5-2-6   4-1-5:   1/1 3-8-1-4   3-2-7-9    1-7-9-5    4-9-6-8:   1/1 6-2-9-7-1   1-5-2-8-6   3-8-5-2-7    6-1-8-4-3:   1/1 7-1-8-4-6-2    5-3-9-1-4-8    8-3-1-9-6-4    7-2-4-8-5-6:   1/1  Months in reverse order:  1/1 Dec-Nov-Oct-Sep-Aug-Jul-Jun-May-Apr-Mar-Feb-Jan (Time <30-secs -- 1 point if no errors under 30-secs) Concentration score:  5 /5  COORDINATION & BALANCE EXAM Modified BESS (20 seconds):  Double leg firm ground:  0 Single leg firm ground:  3 Tandem firm ground:  0 MBESS Total Score:  3/30  Delayed recall score:  5 /10   IMAGING:  MRI Cervical spine 4/11: 1. Normal MRI appearance of the cervical spinal cord. 2.  Degenerative disc osteophyte complex at C6-7 with resultant mild spinal stenosis, with mild to moderate left and mild right C7 foraminal narrowing. 3. Degenerative spondylosis at C5-6 with resultant borderline mild right C6 foraminal stenosis.  MRI Lumbar spine 4/9:     1. Normal MRI appearance of the conus and cauda equina. No findings to explain patient's symptoms. 2. Left foraminal to extraforaminal disc protrusion at L3-4, potentially affecting the exiting left L3 nerve root. 3. Small biforaminal disc protrusions at L4-5 with resultant mild left lateral recess stenosis, with mild left greater than right L4 foraminal narrowing. 4. Degenerative spondylosis at L5-S1 with resultant mild bilateral subarticular stenosis, with mild bilateral L5 foraminal narrowing.                                Assessment:   Concussion with brief loss of consciousness sustained on 11/29/23 after taking methocarbamol  in combination with gabapentin , trazodone , bupropion , striking head on counter while falling.   Postconcussion syndrome History of migraine headaches - on topamax  25mg  and emgality  History of bipolar disorder on bupropion  and sertraline, followed by psychiatry  Plan: 1.  Observation & Restrictions Work - given her level of improvement compared to last visit will attempt return to light duty with half days.  Restrictions for amount of screen time however and  avoidance of noisy areas. 2. Interventions:   - Continue with vestibular therapy, home exercise program.  - Transitioning from PT/OT to home exercise program  - Restart diclofenac  as needed for headache 3. Follow-up in 2-4 weeks to reassess.  Total visit time 35 minutes including documentation.

## 2024-01-13 ENCOUNTER — Encounter: Payer: Self-pay | Admitting: Physical Therapy

## 2024-01-13 ENCOUNTER — Ambulatory Visit: Payer: Self-pay | Admitting: Physical Therapy

## 2024-01-13 ENCOUNTER — Ambulatory Visit: Payer: Self-pay | Attending: Family Medicine | Admitting: Occupational Therapy

## 2024-01-13 ENCOUNTER — Encounter: Payer: Self-pay | Admitting: Family Medicine

## 2024-01-13 VITALS — BP 129/90 | HR 64

## 2024-01-13 DIAGNOSIS — R42 Dizziness and giddiness: Secondary | ICD-10-CM

## 2024-01-13 DIAGNOSIS — R41840 Attention and concentration deficit: Secondary | ICD-10-CM | POA: Diagnosis present

## 2024-01-13 DIAGNOSIS — M542 Cervicalgia: Secondary | ICD-10-CM | POA: Diagnosis present

## 2024-01-13 DIAGNOSIS — M6281 Muscle weakness (generalized): Secondary | ICD-10-CM | POA: Diagnosis present

## 2024-01-13 DIAGNOSIS — R278 Other lack of coordination: Secondary | ICD-10-CM | POA: Insufficient documentation

## 2024-01-13 DIAGNOSIS — R2681 Unsteadiness on feet: Secondary | ICD-10-CM

## 2024-01-13 DIAGNOSIS — R29818 Other symptoms and signs involving the nervous system: Secondary | ICD-10-CM | POA: Insufficient documentation

## 2024-01-13 NOTE — Therapy (Signed)
 OUTPATIENT OCCUPATIONAL THERAPY NEURO Treatment  Patient Name: Theresa [name] MRN: [id number] DOB:[birth year], 53 y.o., female Today's Date: 01/13/2024  PCP: Ezell Hollow, NP  REFERRING PROVIDER: Rodgers Clack, DO   OCCUPATIONAL THERAPY DISCHARGE SUMMARY  Visits from Start of Care: 5  Current functional level related to goals / functional outcomes: Pt has met 100% of STG and LTG to satisfactory levels and is pleased with outcomes.   Remaining deficits: Pt has minimal remaining functional deficits or pain though understands to how to continue with self-management. OT recommended to continue to complete HEP. Pt verbalized understanding.   Education / Equipment: Pt has all needed materials and education. Pt understands how to continue on with self-management. See tx notes for more details.    Patient goals were met. Patient is being discharged due to meeting the stated rehab goals. and pt is pleased with current functional level.      END OF SESSION:  OT End of Session - 01/13/24 1657     Visit Number 5    Number of Visits 7   including eval   Date for OT Re-Evaluation 01/16/24    Authorization Type [employer] [id number]    OT Start Time 1407    OT Stop Time 1447    OT Time Calculation (min) 40 min    Activity Tolerance Patient tolerated treatment well    Behavior During Therapy WFL for tasks assessed/performed                 Past Medical History:  Diagnosis Date   Anemia    Anxiety    Attention deficit disorder    Bipolar disorder (HCC)    currently no meds- agitation noted during PAT assessment   Depression    GERD (gastroesophageal reflux disease)    resolved   H/O transfusion    17 yrs ago postpartum   Hyperlipidemia    resolved   Hypertension    history of HTN prior to gastric bypass   Hypoglycemia    Left breast abscess 01/22/2017   Sleep apnea, obstructive    cpap used auto settings, resolved gastric bypass   Past Surgical History:   Procedure Laterality Date   ABDOMINAL HYSTERECTOMY     ABDOMINOPLASTY     AUGMENTATION MAMMAPLASTY Bilateral 2016   BIOPSY  02/13/2022   Procedure: BIOPSY;  Surgeon: Urban Garden, MD;  Location: [facility];  Service: Gastroenterology;;   BREAST BIOPSY Left 01/2017   abscess removed   CESAREAN SECTION     COLON SURGERY     COLONOSCOPY WITH PROPOFOL  N/A 07/04/2021   Procedure: COLONOSCOPY WITH PROPOFOL ;  Surgeon: Urban Garden, MD;  Location: [facility];  Service: Gastroenterology;  Laterality: N/A;  2:00 pm   ESOPHAGOGASTRODUODENOSCOPY (EGD) WITH PROPOFOL  N/A 02/13/2022   Procedure: ESOPHAGOGASTRODUODENOSCOPY (EGD) WITH PROPOFOL ;  Surgeon: Urban Garden, MD;  Location: [facility];  Service: Gastroenterology;  Laterality: N/A;  815   GASTRIC ROUX-EN-Y N/A 12/06/2013   Procedure: LAPAROSCOPIC ROUX-EN-Y GASTRIC BYPASS WITH UPPER ENDOSCOPY;  Surgeon: Fran Imus, MD;  Location: [facility];  Service: General;  Laterality: N/A;   INCISION AND DRAINAGE ABSCESS Left 01/22/2017   Procedure: INCISION AND DRAINAGE LEFT  BREAST ABSCESS;  Surgeon: Boyce Byes, MD;  Location: [facility];  Service: General;  Laterality: Left;   LAPAROTOMY N/A 02/18/2017   Procedure: LAPAROTOMY;  Surgeon: Woodrow Hazy, MD;  Location: [facility];  Service: Gynecology;  Laterality: N/A;   POLYPECTOMY  07/04/2021   Procedure: POLYPECTOMY;  Surgeon: Urban Garden, MD;  Location: [facility];  Service: Gastroenterology;;   SALPINGOOPHORECTOMY Bilateral 02/18/2017   Procedure: BILATERAL SALPINGO OOPHORECTOMY;  Surgeon: Woodrow Hazy, MD;  Location: [facility];  Service: Gynecology;  Laterality: Bilateral;   TUBAL LIGATION     UNILATERAL SALPINGECTOMY Right 09/05/2015   Procedure: UNILATERAL SALPINGECTOMY;  Surgeon: Woodrow Hazy, MD;  Location: [facility];  Service: Gynecology;  Laterality: Right;   VAGINAL HYSTERECTOMY N/A 09/05/2015   Procedure:  HYSTERECTOMY VAGINAL;  Surgeon: Woodrow Hazy, MD;  Location: [facility];  Service: Gynecology;  Laterality: N/A;   WISDOM TOOTH EXTRACTION     Patient Active Problem List   Diagnosis Date Noted   Barrett's esophagus without dysplasia 04/25/2023   Heartburn 04/21/2023   Drug-induced liver injury 04/25/2022   IBS (irritable colon syndrome) 04/25/2022   Nausea with vomiting 12/24/2021   Lymphadenopathy 12/08/2019   Left breast lump 12/08/2019   Insomnia 11/09/2019   History of benign adrenal tumor 01/08/2019   Abdominal pain, chronic, left lower quadrant (Primary Area of Pain) 07/27/2018   Chronic left-sided low back pain (Secondary Area of Pain) 07/27/2018   Chronic pain syndrome 07/27/2018   Pharmacologic therapy 07/27/2018   Disorder of skeletal system 07/27/2018   Problems influencing health status 07/27/2018   Hypoglycemia after GI (gastrointestinal) surgery 04/10/2018   Estrogen deficiency 07/01/2017   Lower abdominal pain 06/03/2017   Alcohol use    Bipolar affect, depressed (HCC) 03/05/2017   Chronic post-traumatic stress disorder (PTSD) 03/05/2017   Bipolar affective disorder, current episode mixed (HCC) 03/05/2017   Neuropathic pain 02/18/2017   Adenomyosis 09/05/2015   Anemia 04/20/2015   Hyperglycemia 08/18/2014   S/P gastric bypass 12/06/2013   GERD (gastroesophageal reflux disease) 10/13/2013   Essential hypertension 10/02/2013   Hyperlipidemia 01/05/2013    ONSET DATE: 12/09/23 (referral date)  REFERRING DIAG:  S06.0X9A (ICD-10-CM) - Concussion with loss of consciousness, initial encounter  F07.81 (ICD-10-CM) - Postconcussive syndrome   Per 12/09/23 referral notes: "Post concussion therapy-visual spacial deficits"   THERAPY DIAG:  Other lack of coordination  Muscle weakness (generalized)  Other symptoms and signs involving the nervous system  Attention and concentration deficit  Rationale for Evaluation and Treatment: Rehabilitation  SUBJECTIVE:    SUBJECTIVE STATEMENT: Pt reported feeling "wonderful" today: "Everything is great." Pt hopes to return to work this week. Per pt report: Pt has been released by doctor to attempt half days at work though with restrictions. Pt reported the following restrictions: 4 hours per day, limit screen time, and avoid places of noise (e.g. cafeteria and hallway). Pt reported restrictions in place for next 4 weeks though following up with doctor about potentially adjusting the restrictions slightly to allow for return to work while accommodating general nature of a teaching job.   Screen use: Pt reported ongoing "migraine" "really badly" if using computer for too long. E.g. Completed a class and project on computer on Saturday and then migraine on Sunday.  If not on screens: Pt reported symptoms "haven't been too bad." Pt reported reading "still sucks" but pt has not experienced any debilitating symptoms like before when attempting to read.   Pt accompanied by: self - pt drove self  PERTINENT HISTORY: PTSD, bipolar disorder, chronic back pain and pain disorder   CT Head wo contrast 12/03/2023: IMPRESSION: 1. No evidence of acute intracranial abnormality. 2. Left-sided sinusitis.  Per 12/11/23 PT Evaluation Note: "Patient reports that she fell and hit her face on the  counter and back of head on toilet with loss of consciousness for about a minute or two; she was on a muscle relaxor for back pain at time of injury. Patient was also throwing up after the injury. She had the accident on Saturday, March 21-22nd. Patient reports since the accident she has had really bad headaches, light sensitivity, twitching eye, difficulty getting words out, balance, and pain in head and neck as well as low back. Patient denies previous concussions. Patient reports that her headaches are constant since accident but are really bad following activity and last for several hours. Patient is not currently on any medications for  headaches to her knowledge.   PRECAUTIONS: FALL  WEIGHT BEARING RESTRICTIONS: No  PAIN:  Are you having pain? No  FALLS: Has patient fallen in last 6 months? Yes. Number of falls 1 at time of injury, needed husbands help to get up.  12/29/23 - Pt fell outside "I stuck my foot in a hole in the ground." Pt reported no injuries and able to recover ind.   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  HAND DOMINANCE: Right  ADLs: Overall ADLs:  Transfers/ambulation related to ADLs: Eating: ind, no difficulty. Pt reported reduced appetite since the injury. Grooming: ind, difficulty grasping toothbrush "when my hands stiffen up" - toothbrush sometimes fall out of pt's mouth/hand, "I make a mess." UB Dressing: ind, stiffness near neck so some difficulty with lifting arm  "it feels like a sharp pinch, not painful." Pt has not attempted buttons/zippers. LB Dressing: ind, difficult d/t low back pain. Completes task sitting Toileting: ind Bathing: ind, standing though has shower chair available in the shower if pt gets dizzy, walk-in shower  Tub Shower transfers: no difficulty Equipment: Walk in shower  IADLs: Shopping: family assists, pt previously completed grocery shopping Light housekeeping: Pt reported "some of it I'm okay" (washing dishes, wiping counters, folding laundry) though family assists with sweeping and transferring clothes for laundry (pt experiences dizziness when bending at waist) Meal Prep: "I haven't really been eating." Pt reported last eating last  night around 6 PM. Pt reported drinking water  for hydration.  Community mobility: drove self Medication management: family assists for set-up, uses medication organizer  Handwriting:  Pt wrote name and simple sentence with 100% legibility. No concerns noted. Pt sometimes stretched hand likely d/t stiffness. Pt reported "it hurts" across hand when holding pen.  Occupation: Middle Engineer, site. Difficulty with picking up objects, pt reported preference to work on "more like FM skills without it hurting so bad"   MOBILITY STATUS:  ind  POSTURE COMMENTS:  Sitting balance ind  ACTIVITY TOLERANCE: Activity tolerance: Pt reported nausea and dizziness if working on a task.  Pt reported nausea currently.  FUNCTIONAL OUTCOME MEASURES: PSFS: 3.6    12/29/23 - PSFS: 9.2   Total score = sum of the activity scores/number of activities Minimum detectable change (90%CI) for average score = 2 points Minimum detectable change (90%CI) for single activity score = 3 points   UPPER EXTREMITY ROM:    Active ROM Right eval Left eval  Shoulder  flexion Slightly less AROM on R side d/t neck pain, though [facility]  Shoulder abduction    Shoulder adduction    Shoulder extension    Shoulder internal rotation    Shoulder external rotation    Elbow flexion [facility] WFL  Elbow extension    Wrist flexion    Wrist extension    Wrist ulnar deviation    Wrist radial deviation    Wrist pronation    Wrist supination    (Blank rows = not tested)  BUE Composite digit flex/ext - WFL  UPPER EXTREMITY MMT:     MMT Right eval Left eval  Shoulder flexion    Shoulder abduction    Shoulder adduction    Shoulder extension    Shoulder internal rotation    Shoulder external rotation    Middle trapezius    Lower trapezius    Elbow flexion    Elbow extension    Wrist flexion    Wrist extension    Wrist ulnar deviation    Wrist radial deviation    Wrist pronation    Wrist supination    (Blank rows =  not tested)  HAND FUNCTION: Grip strength: Right: 39.4, 24.9, 25.7 (30 lbs average) lbs; Left: 39.4, 44.3, 47.6 (43.7 lbs average)  lbs  COORDINATION:  9-hole peg test: Right: 45 lbs; Left: 41 lbs  RUE: Pt demo'd difficulty with initial grasp of item. Pt paused to stretch out fingers 2x near end of task. Pt reported 7/10 pain level. Pt reported "the more I tried using it, the more it hurts in wrist."  LUE: Pt paused to stretch out fingers 5x. Pt reported pain of hand and pointed out over thenar eminence. Pt reported 4/10 pain level.  SENSATION: Pt denied tingling/numbness in B arm/shoulder/hands. Pt reported tingling in head "all the time" 10/10.   12/22/23 update - OT noted pt had several small cuts on B hands with bandages covering some cuts. Pt reported accidentally injuring hands when caring for animals. Pt reported she was not aware that the small cuts occurred at the time though notices the cuts now. OT questioning ?potential sensory involvement at BUE.  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 Pt reported needing to wear glasses all the time. Pt reported glasses help though pt reported difficulty tolerating pressure of glasses and pointed out under eye the glasses "doesn't even touch but it hurts here." Pt reported pain/pressure decreases when removing glasses.  Baseline vision: Wears glasses all the time Visual history: None noted  VISION ASSESSMENT: Not tested  PERCEPTION: Not tested  PRAXIS: Not tested  OBSERVATIONS: Pt was pleasant. Pt ambulated without A/E. When seated and not participating in conversation or a task, pt sometimes closed eyes and held her head in her hands. Pt benefited from low-light  environment d/t headache pain. Pt often stretched out hands briefly during functional tasks involving grasp secondary to B hand pain based on pt report.  TREATMENT DATE:    [employer] OT assessed pt's progress towards goals, see below for updates.   OT reviewed HEP. Pt returned demo ind. Pt reported no questions or concerns about HEP.  OT educated pt on Gradual return to work, f/u with doctor about restrictions concerns, having back-up plan in case pt needs break at work, caution to recognize limits and respect limits as pt returns to work, energy conservation strategies. Pt acknowledged understanding of all and reported already having plan in place at work in case pt needs break at work.  Puzzle (48-piece puzzle) - for BUE FM coordination, for sequencing and visual scanning and pattern recognition. Pt easily completed 100% of puzzle ind. Pt denied headache pain/discomfort for duration of tasks today.   PATIENT EDUCATION: Education details: see today's tx above Person educated: Patient Education method: Explanation Education comprehension: verbalized understanding  HOME EXERCISE PROGRAM: 12/22/23 - tendon glides, sleep hygiene, energy conservation, sensory safety precautions (see pt instructions) 12/25/23 - pink theraputty HEP, Access Code: [email or web address]   GOALS: Goals reviewed with patient? Yes  SHORT TERM GOALS: Target date: 01/02/24  Pt will be ind with initial HEP using visual handouts. Baseline: new to outpt OT 12/22/23 - tendon glides issued, pt returned demo. 12/29/23 - FM coordination HEP initiated. Pt returned demo of FM coordination HEP. 01/13/24 - Pt ind returned demo of tendon glides, theraputty, FM coordination HEP. Goal status: MET  2.  Pt will ind recall at least 3 energy conservation strategies. Baseline: pt reported fatigue, symptoms of nausea and  dizziness when completing functional tasks 12/29/23 - Pt ind recalled using reacher, positioning of body during functional tasks to avoid increasing symptoms, using line reader, completing exercises, and taking breaks PRN.  Goal status: MET  3.  Pt will demo understanding of adaptive strategies and A/E to reduce pain and improve efficiency with ADLs/IADLs. Baseline: Pt reported difficulty with UB/LB dressing d/t pain, difficulty with sweeping and laundry d/t symptoms of nausea and dizziness, no appetite for food and not eating as often. 12/29/23 - Pt ind recalled using reacher to pick up objects, using line reader for reading, completing tasks seated (e.g. squatting when gardening).  Goal status: MET  4.  Pt will recall at least 3 strategies to reduce pain/stiffness of BUE. Baseline: Pt reported pain/stiffness of B hands during functional tasks. 12/29/23 - Pt ind recalled completing exercises (e.g. theraputty, tendon glides), ice packs and heat to manage pain/stiffness.  Goal status: MET  LONG TERM GOALS: Target date: 01/16/24  Patient will report at least two-point increase in average PSFS score or at least three-point increase in a single activity score indicating functionally significant improvement given minimum detectable change. Baseline: PSFS: 3.6 total score (See above for individual activity scores)  12/29/23 - PSFS: 9.2 Goal status: MET  2.  Patient will maintain at least  70 lbs RUE grip strength across all 3 trials as needed to grasp toothbrush and writing utensils. Baseline: Grip strength: Right: 39.4, 24.9, 25.7 (30 lbs average) lbs; Left: 39.4, 44.3, 47.6 (43.7 lbs average)  lbs 12/29/23 -  RUE: 70.9, 65.9, 67.9 (68.2 lbs average) LUE: 72.0, 70.3, 70.9 (71.1 lbs average) 01/13/24 - 68.5, 70.7, 69.8 (69.7 lbs average) - Pt demo'd consistent grip strength across all the trials, near or above 70 lbs or all 3 trials. Goal status: MET  3.  Patient will demo maintained FM coordination as  evidenced by completing nine-hole peg with use of RUE in  20 seconds or less.  Baseline: 9-hole peg  test: Right: 45 lbs; Left: 41 lbs 12/29/23 - RUE: 20 seconds. LUE: 23 seconds. 01/13/24 - RUE: Trial 1: 21 seconds. Trial 2: 20 seconds. - Pt maintained FM coordination.  Goal status: MET  ASSESSMENT:  CLINICAL IMPRESSION: Pt tolerated tasks very well, demo'ing continued reduced symptoms and maintaining strength and coordination of BUE compared to previous OT session. Pt met 100% of STG and LTG today. Pt reported intent to return to work soon with restrictions. Pt eager to return to work. Pt reporting improved ability to participate in functional tasks, sometimes still unable to tolerate screens for extended periods of time though otherwise participating in daily tasks very well.   Pt has met 100% of STG and LTG to satisfactory levels and is pleased with outcomes. Pt has minimal remaining functional deficits or pain though understands to how to continue with self-management. OT recommended to continue to complete HEP. Pt verbalized understanding. Pt has all needed materials and education. Pt understands how to continue on with self-management. See tx notes for more details. Patient goals were met. Patient is being discharged due to meeting the stated rehab goals and pt is pleased with current functional level.  PERFORMANCE DEFICITS: in functional skills including ADLs, IADLs, coordination, dexterity, proprioception, sensation, ROM, strength, pain, flexibility, Fine motor control, Gross motor control, mobility, body mechanics, endurance, vision, and UE functional use, cognitive skills including attention and energy/drive, and psychosocial skills including environmental adaptation.   IMPAIRMENTS: are limiting patient from ADLs, IADLs, rest and sleep, work, leisure, and social participation.   CO-MORBIDITIES: may have co-morbidities  that affects occupational performance. Patient will benefit from skilled  OT to address above impairments and improve overall function.  MODIFICATION OR ASSISTANCE TO COMPLETE EVALUATION: Min-Moderate modification of tasks or assist with assess necessary to complete an evaluation.  OT OCCUPATIONAL PROFILE AND HISTORY: Detailed assessment: Review of records and additional review of physical, cognitive, psychosocial history related to current functional performance.  CLINICAL DECISION MAKING: Moderate - several treatment options, min-mod task modification necessary  REHAB POTENTIAL: Good  EVALUATION COMPLEXITY: Moderate    PLAN:  OT FREQUENCY: 1-2x/week  OT DURATION: 3 weeks   PLANNED INTERVENTIONS: 97168 OT Re-evaluation, 97535 self care/ADL training, 44034 therapeutic exercise, 97530 therapeutic activity, 97112 neuromuscular re-education, 97140 manual therapy, 97035 ultrasound, 97018 paraffin, 74259 fluidotherapy, 97010 moist heat, 97010 cryotherapy, passive range of motion, functional mobility training, visual/perceptual remediation/compensation, energy conservation, patient/family education, and DME and/or AE instructions  RECOMMENDED OTHER SERVICES: PT eval completed, SLP eval scheduled  CONSULTED AND AGREED WITH PLAN OF CARE: Patient  PLAN FOR NEXT SESSION:  N/A - D/C completed     Oakley Bellman, OT 01/13/2024, 5:03 PM

## 2024-01-13 NOTE — Therapy (Signed)
 OUTPATIENT PHYSICAL THERAPY VESTIBULAR TREATMENT / DISCHARGE  Patient Name: Theresa [name] MRN: [id number] DOB:September 20, 1970, 53 y.o., female Today's Date: 01/13/2024  PHYSICAL THERAPY DISCHARGE SUMMARY  Visits from Start of Care: 7  Current functional level related to goals / functional outcomes: Achieved al LTG   Remaining deficits: Mild headaches and brain fog   Education / Equipment: When to return to PT, pacing for return to work   Patient agrees to discharge. Patient goals were met. Patient is being discharged due to meeting the stated rehab goals.   END OF SESSION:  PT End of Session - 01/13/24 1446     Visit Number 7    Number of Visits 7    Date for PT Re-Evaluation 01/15/24    Authorization Type [employer]    PT Start Time 1446    PT Stop Time 1503    PT Time Calculation (min) 17 min    Equipment Utilized During Treatment Gait belt    Activity Tolerance Patient tolerated treatment well    Behavior During Therapy WFL for tasks assessed/performed              Past Medical History:  Diagnosis Date   Anemia    Anxiety    Attention deficit disorder    Bipolar disorder (HCC)    currently no meds- agitation noted during PAT assessment   Depression    GERD (gastroesophageal reflux disease)    resolved   H/O transfusion    17 yrs ago postpartum   Hyperlipidemia    resolved   Hypertension    history of HTN prior to gastric bypass   Hypoglycemia    Left breast abscess 01/22/2017   Sleep apnea, obstructive    cpap used auto settings, resolved gastric bypass   Past Surgical History:  Procedure Laterality Date   ABDOMINAL HYSTERECTOMY     ABDOMINOPLASTY     AUGMENTATION MAMMAPLASTY Bilateral 2016   BIOPSY  02/13/2022   Procedure: BIOPSY;  Surgeon: Urban Garden, MD;  [facility]: [facility];  Service: Gastroenterology;;   BREAST BIOPSY Left 01/2017   abscess removed   CESAREAN SECTION     COLON SURGERY     COLONOSCOPY WITH PROPOFOL  N/A  07/04/2021   Procedure: COLONOSCOPY WITH PROPOFOL ;  Surgeon: Urban Garden, MD;  [facility]: [facility];  Service: Gastroenterology;  Laterality: N/A;  2:00 pm   ESOPHAGOGASTRODUODENOSCOPY (EGD) WITH PROPOFOL  N/A 02/13/2022   Procedure: ESOPHAGOGASTRODUODENOSCOPY (EGD) WITH PROPOFOL ;  Surgeon: Urban Garden, MD;  [facility]: [facility];  Service: Gastroenterology;  Laterality: N/A;  815   GASTRIC ROUX-EN-Y N/A 12/06/2013   Procedure: LAPAROSCOPIC ROUX-EN-Y GASTRIC BYPASS WITH UPPER ENDOSCOPY;  Surgeon: Fran Imus, MD;  Location: [facility];  Service: General;  Laterality: N/A;   INCISION AND DRAINAGE ABSCESS Left 01/22/2017   Procedure: INCISION AND DRAINAGE LEFT  BREAST ABSCESS;  Surgeon: Boyce Byes, MD;  Location: [facility];  Service: General;  Laterality: Left;   LAPAROTOMY N/A 02/18/2017   Procedure: LAPAROTOMY;  Surgeon: Woodrow Hazy, MD;  Location: [facility];  Service: Gynecology;  Laterality: N/A;   POLYPECTOMY  07/04/2021   Procedure: POLYPECTOMY;  Surgeon: Urban Garden, MD;  [facility]: [facility];  Service: Gastroenterology;;   SALPINGOOPHORECTOMY Bilateral 02/18/2017   Procedure: BILATERAL SALPINGO OOPHORECTOMY;  Surgeon: Woodrow Hazy, MD;  Location: [facility];  Service: Gynecology;  Laterality: Bilateral;   TUBAL LIGATION     UNILATERAL SALPINGECTOMY Right 09/05/2015   Procedure: UNILATERAL SALPINGECTOMY;  Surgeon: Woodrow Hazy, MD;  Location: [facility];  Service: Gynecology;  Laterality: Right;   VAGINAL HYSTERECTOMY N/A 09/05/2015   Procedure: HYSTERECTOMY VAGINAL;  Surgeon: Woodrow Hazy, MD;  Location: [facility];  Service: Gynecology;  Laterality: N/A;   WISDOM TOOTH EXTRACTION     Patient Active Problem List   Diagnosis Date Noted   Barrett's esophagus without dysplasia 04/25/2023   Heartburn 04/21/2023   Drug-induced liver injury 04/25/2022   IBS (irritable colon syndrome) 04/25/2022   Nausea with vomiting  12/24/2021   Lymphadenopathy 12/08/2019   Left breast lump 12/08/2019   Insomnia 11/09/2019   History of benign adrenal tumor 01/08/2019   Abdominal pain, chronic, left lower quadrant (Primary Area of Pain) 07/27/2018   Chronic left-sided low back pain (Secondary Area of Pain) 07/27/2018   Chronic pain syndrome 07/27/2018   Pharmacologic therapy 07/27/2018   Disorder of skeletal system 07/27/2018   Problems influencing health status 07/27/2018   Hypoglycemia after GI (gastrointestinal) surgery 04/10/2018   Estrogen deficiency 07/01/2017   Lower abdominal pain 06/03/2017   Alcohol use    Bipolar affect, depressed (HCC) 03/05/2017   Chronic post-traumatic stress disorder (PTSD) 03/05/2017   Bipolar affective disorder, current episode mixed (HCC) 03/05/2017   Neuropathic pain 02/18/2017   Adenomyosis 09/05/2015   Anemia 04/20/2015   Hyperglycemia 08/18/2014   S/P gastric bypass 12/06/2013   GERD (gastroesophageal reflux disease) 10/13/2013   Essential hypertension 10/02/2013   Hyperlipidemia 01/05/2013    PCP: Ezell Hollow, NP REFERRING PROVIDER: Rodgers Clack, DO  REFERRING DIAG: S06.0X9A (ICD-10-CM) - Concussion with loss of consciousness, initial encounter F07.81 (ICD-10-CM) - Postconcussive syndrome  THERAPY DIAG:  Dizziness and giddiness  Unsteadiness on feet  Cervicalgia  ONSET DATE: 12/09/2023 (referral date)  Rationale for Evaluation and Treatment: Rehabilitation  SUBJECTIVE:   SUBJECTIVE STATEMENT: Patient reports doing excellently and feeling ready for discharge. Denies falls and near falls. Only having some challenges with long screen time but is otherwise doing very well.   Pt accompanied by: self - drove self  PERTINENT HISTORY: PTSD, bipolar disorder, chronic back pain and pain disorder  PAIN:  Are you having pain? No  PRECAUTIONS: Fall  PATIENT GOALS: "To be able to move without pain."   OBJECTIVE:  Note: Objective measures were completed  at Evaluation unless otherwise noted.  DIAGNOSTIC FINDINGS:   CT Head wo contrast 12/03/2023: IMPRESSION: 1. No evidence of acute intracranial abnormality. 2. Left-sided sinusitis.                                                TREATMENT:  Vitals:   01/13/24 1450  BP: (!) 129/90  Pulse: 64   Seated on RUE, elevated but WFL for therapy   THERact:  GOALS CHECK  PATIENT SURVEYS:    [place] Post Concussion Symptoms Questionnaire:   Compared to before the accident, do you now (last 24 hours) suffer from:         On Eval  On Discharge  Headaches 4 = severe problem 2  Feeling of Dizziness 4 = severe problem 0  Nausea and/or vomiting 3 = moderate problem 0  RPQ-3 (total of first 3 items) 11 2        Noise sensitivity (easily upset by loud noises) 2 = mild problem 0  Sleep disturbances 4 = severe problem 0  Fatigue, tiring more  easily 4 = severe problem 0  Being irritable, easily angered: 2 = mild problem 0  Feeling depressed or tearful 2 = mild problem 0  Feeling frustrated or impatient 4 = severe problem 0  Forgetfulness, poor memory 4 = severe problem 2  Poor concentration 3 = moderate problem 0  Taking longer to think 4 = severe problem 1  Blurred vision 4 = severe problem 0  Light sensitivity (easily upset by bright light) 4 = severe problem 0  Double vision 4 = severe problem 0  Restlessness 2 = mild problem 0  RPQ-13 (total for next 13 items) 43 3    Sharp neck pains: 0 on Discharge (4 on eval) Unsteady: 0 on Discharge (4 on eval) Trouble articulating: 0 on Discharge (4 on eval)   Motion Sensitivity Quotient   Intensity: 0 = none, 1 = Lightheaded, 2 = Mild, 3 = Moderate, 4 = Severe, 5 = Vomiting   Intensity on Eval Intensity on Discharge   1. Sitting to supine     2. Supine to L side     3. Supine to R side     4. Supine to sitting     5. L Hallpike-Dix     6. Up from L      7. R Hallpike-Dix     8. Up from R      9. Sitting, head  tipped to L knee 4  0  10. Head up from L  knee 4+ 0  11. Sitting, head  tipped to R knee 4 0  12. Head up from R  knee 4+ 0  13. Sitting head turns x5 1 0  14.Sitting head nods x5 1 0  15. In stance, 180  turn to L  1 0  16. In stance, 180  turn to R 4 0     PATIENT EDUCATION: Education details: Continue HEP and results on LTG Person educated: Patient Education method: Explanation, Demonstration, and Handouts Education comprehension: verbalized understanding and needs further education  HOME EXERCISE PROGRAM:  -[address] string  -saccades   Sitting, slowly bend head down and pick up object on the floor. Return to upright position. Hold position until symptoms subside. Repeat __3__ times per session. Do __3__ sessions per day. Head Motion: Side to Side    Sitting, tilt head down 15-30, slowly move head to right with eyes open. Hold position until symptoms subside. Then, move head slowly to opposite side. Hold position until symptoms subside. Repeat __10__ times per session. Do ___3_ sessions per day. Head Motion: Up and Down    Sitting, slowly move head up with eyes open. Hold position until symptoms subside. Then, move head in opposite direction. Hold position until symptoms subside. Repeat __10__ times per session. Do __3__ sessions per day. Gaze Stabilization: Sitting    Keeping eyes on target on wall 5 feet away, tilt head down 15-30 and move head side to side for _30___ seconds. Repeat while moving head up and down for __30__ seconds. Do __3__ sessions per day.  Access Code: [email or web address] URL: [email or web address] Date: 12/19/2023 Prepared by: Nickola Baron  Exercises - Seated Slump Nerve Glide  - 1 x daily - 7 x weekly - 3 sets - 10 reps  Access Code: [email or web address] URL: [email or web address] Date: 12/22/2023 Prepared by: Camella Cave  Exercises - Corner Balance Feet Together: Eyes Closed With Head Turns  - 1 x daily - 7 x weekly - 2-3 sets - 10  reps  GOALS: Goals  reviewed with patient? Yes  LONG TERM GOALS: Target date: 01/15/2024  Patient will report demonstrate independence with final HEP in order to maintain current gains and continue to progress after physical therapy discharge.   Baseline: To be provided, reports confidence in HEP Goal status: MET  2.  Patient will improve RPQ-13 score to </= 35 per item to indicate reduced severity of post-concussive symptoms to progress towards PLOF.   Baseline: 43; improved 3 Goal status: MET  3.  Patient will improve RPQ-3 score to </= 7 per item to indicate reduced severity of post-concussive symptoms to progress towards PLOF.   Baseline: 11; improved 2 Goal status: MET  4.  Patient will report self-written in score on RPQ for neck pain as =< 3/4 to demonstrate improved cervical function.  Baseline: 4/4; improved to 0/4 Goal status: MET  5.  Pt will report </= 2/5 for all movements on MSQ to indicate improvement in motion sensitivity and improved activity tolerance.   Baseline: 4+/5; improved to 0/5 Goal status: MET  6.  mCTSIB to be assessed / LTG written Baseline: 120/120 Goal status: D/C - 120/120  ASSESSMENT:  CLINICAL IMPRESSION: Patient is discharging today as has far surpassed all LTG and feeling back to near baseline. Patient is independent in HEP and only demonstrates very mild residual deficits on RPQ. Patient also demonstates no residual motion sensitivity on MSQ today.  OBJECTIVE IMPAIRMENTS: decreased balance, dizziness, and pain.   ACTIVITY LIMITATIONS: lifting, standing, sleeping, reach over head, and caring for others  PARTICIPATION LIMITATIONS: meal prep, laundry, community activity, and occupation  PERSONAL FACTORS: Age, Time since onset of injury/illness/exacerbation, and 1-2 comorbidities: see above  are also affecting patient's functional outcome.   REHAB POTENTIAL: Fair highly symptomatic and long history of chronic low back pain  CLINICAL  DECISION MAKING: Evolving/moderate complexity  EVALUATION COMPLEXITY: Moderate   PLAN:  PT FREQUENCY: 2x/week (ideally but may have to modify frequency given copay)  PT DURATION: 3 weeks (anticipate may   PLANNED INTERVENTIONS: 97164- PT Re-evaluation, 97110-Therapeutic exercises, 97530- Therapeutic activity, [id number]- Neuromuscular re-education, 97535- Self Care, 16109- Manual therapy, and 60454- Gait training  PLAN FOR NEXT SESSION:  NA - D/C with final HEP   Coreen Devoid, PT, DPT  01/13/2024, 3:10 PM

## 2024-01-14 ENCOUNTER — Encounter: Payer: Self-pay | Admitting: Family Medicine

## 2024-02-09 ENCOUNTER — Ambulatory Visit: Payer: Self-pay | Admitting: Neurology

## 2024-02-16 ENCOUNTER — Encounter: Payer: Self-pay | Admitting: Family Medicine

## 2024-02-19 ENCOUNTER — Encounter: Payer: Self-pay | Admitting: Family Medicine

## 2024-02-19 ENCOUNTER — Ambulatory Visit: Admitting: Family Medicine

## 2024-02-19 VITALS — BP 94/64 | Ht 64.0 in | Wt 150.0 lb

## 2024-02-19 DIAGNOSIS — F0781 Postconcussional syndrome: Secondary | ICD-10-CM | POA: Diagnosis not present

## 2024-02-19 DIAGNOSIS — R51 Headache with orthostatic component, not elsewhere classified: Secondary | ICD-10-CM

## 2024-02-19 DIAGNOSIS — S060X9D Concussion with loss of consciousness of unspecified duration, subsequent encounter: Secondary | ICD-10-CM

## 2024-02-19 NOTE — Progress Notes (Signed)
 DATE OF VISIT: 02/19/2024        Theresa [name] DOB: 09-09-1971 MRN: [id number]  CC: Concussion follow-up  HPI:  Theresa [name] presented to the office today for concussion follow-up.  Theresa [name] sustained a concussion with LOC on 11/29/23 after syncopal episode and hitting her head on a bathroom vanity.  Last evaluation with our office was 01/08/24 with my colleague Dr Peggy Bowens.  Since last visit Theresa [name] reports symptoms are greatly improved. 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   Concentration is normal.   Still having some head pressure and neck pain - she states this is her baseline related to her underlying migraine headaches She feels back to her baseline and is requesting full return to work at this time  MEDICATIONS:    Current Outpatient Medications:    ALPRAZolam  (XANAX ) 0.5 MG tablet, Take 0.5 mg by mouth daily as needed for anxiety., Disp: , Rfl:    buPROPion  (WELLBUTRIN  SR) 150 MG 12 hr tablet, TAKE 1 TABLET BY MOUTH TWICE A DAY (Patient taking differently: Take 150 mg by mouth daily.), Disp: 180 tablet, Rfl: 1   diclofenac  (VOLTAREN ) 75 MG EC tablet, Take 1 tablet (75 mg total) by mouth 2 (two) times daily., Disp: 60 tablet, Rfl: 1   dicyclomine  (BENTYL ) 20 MG tablet, Take 1/2 tablet (10 mg total) by mouth every 12 (twelve) hours as needed (abdominal pain)., Disp: 90 tablet, Rfl: 1   esomeprazole  (NEXIUM ) 40 MG capsule, Take 1 capsule (40 mg total) by mouth 2 (two) times daily before a meal., Disp: 30 capsule, Rfl: 11   estradiol  (ESTRACE ) 1 MG tablet, Take 1 tablet (1  mg total) by mouth 2 (two) times daily., Disp: 180 tablet, Rfl: 0   gabapentin  (NEURONTIN ) 600 MG tablet, TAKE 1 TABLET BY MOUTH IN THE MORNING TAKE 1 TABLET IN THE EVENING AND TAKE 2 TABLETS AT BEDTIME (Patient taking differently: Take 600-1,200 mg by mouth See admin instructions. TAKE  600 MG BY MOUTH IN THE MORNING AND AT BEDTIME, 1200 MG AT LUNCHTIME), Disp: 120 tablet, Rfl: 3   Galcanezumab -gnlm (EMGALITY ) 120 MG/ML SOAJ, Inject 120 mg into the skin every 28 (twenty-eight) days., Disp: 3 mL, Rfl: 2   lisinopril  (ZESTRIL ) 20 MG tablet, Take 20 mg by mouth daily., Disp: , Rfl:    lubiprostone  (AMITIZA ) 24 MCG capsule, Take 1 capsule (24 mcg total) by mouth 2 (two) times daily with a meal., Disp: 180 capsule, Rfl: 3   methocarbamol  (ROBAXIN ) 500 MG tablet, Take 1 tablet (500 mg total) by mouth 3 (three) times daily., Disp: 60 tablet, Rfl: 1   metoprolol  succinate (TOPROL -XL) 25 MG 24 hr tablet, TAKE 1 TABLET BY MOUTH EVERY DAY, Disp: 90 tablet, Rfl: 0   ondansetron  (ZOFRAN ) 4 MG tablet, Take 1 tablet (4 mg total) by mouth every 8 (eight) hours as needed for nausea or vomiting., Disp: 90 tablet, Rfl: 1   ondansetron  (ZOFRAN -ODT) 4 MG disintegrating tablet, Take 1 tablet (4 mg total) by mouth every 8 (eight) hours as needed for nausea or vomiting., Disp: 20 tablet,  Rfl: 0   rizatriptan  (MAXALT -MLT) 10 MG disintegrating tablet, Take 1 tablet (10 mg total) by mouth as needed for migraine. May repeat in 2 hours if needed. Maximum 2 tablets in 24 hours., Disp: 10 tablet, Rfl: 5   sertraline (ZOLOFT) 50 MG tablet, Take 50 mg by mouth daily., Disp: , Rfl:    topiramate  (TOPAMAX ) 25 MG tablet, Take 1 tablet (25 mg total) by mouth at bedtime., Disp: 30 tablet, Rfl: 5   traZODone  (DESYREL ) 100 MG tablet, Take 50 mg by mouth at bedtime., Disp: , Rfl:   ALLERGIES:   Allergies  Allergen Reactions   Other Hives    Needs benadryl  when given narcotics     PHYSICAL  EXAMINATION BP 94/64   Ht 5' 4 (1.626 m)    Wt 150 lb (68 kg)   LMP  (LMP Unknown) Comment: AUB  BMI 25.75 kg/m  GENERAL: AOx3, no acute distress, sitting comfortable in exam room HEENT: PERRLA, EOMI - normal visual tracking, slight horizontal nystagmus, but greatly improved from previous; normal smooth pursuits,  normal visual field testing RESPIRATORY: normal respirations, unlabored, symmetric PSYCH: appropriate mood, answering questions appropriately, good insight MSK: normal gross  upper extremity and lower extremity strength bilaterally Cervical spine: no gross deformity, full ROM without pain  NEURO: CN II-XII intact, no focal deficits Neg Rhomberg, normal gait  SCAT 6 Score: Total symptoms: 2/22 Symptom score: 4/132  Headache    0/(0-6) "Pressure in head"  2/(0-6) Neck Pain   2/(0-6) Nausea or vomiting  0/(0-6) Dizziness   0/(0-6) Blurred vision   0/(0-6) Balance problems   0/(0-6) Sensitivity to light  0/(0-6) Sensitivity to noise  0/(0-6) Feeling slowed down  0/(0-6) Feeling like in a fog  0/(0-6) Don't feel right  0/(0-6) Difficulty concentrating  0/(0-6) Difficulty remembering 0/(0-6) Fatigue or low energy  0/(0-6) Confusion   0/(0-6) Drowsiness   0/(0-6) More emotional  0/(0-6) Irritability   0/(0-6) Sadness   0/(0-6) Nervous or Anxious  0/(0-6) Trouble falling asleep  0/(0-6)  Do the symptoms get worse with physical activity?Yes - sometimes if moving head certain ways.  She notes this was present prior to her concussion and is her baseline Do the symptoms get worse with mental activity?  no           Previous SCAT score:  15/22 symptoms, 33/132 severity score on 01/08/24  STANDARDIZED ASSESSMENT OF CONCUSSION [facility]): Orientation What month is it? 1. What is the date today? 1. What is the day of the week? 1. What year is it? 1. What time is it right now (within one hour)? 1. Orientation score: 5 /5  Immediate memory (3 trials) List A  List B  List C Jacket  Finger  XXX  Baby Arrow  [place] XXX Monkey Pepper  Blanket XXX Perfume Cotton  Lemon XX Sunset Movie  Insect XXX Iron Dollar  Candle  Elbow Honey  Paper  Radiation protection practitioner  Wagon  Bubble  First time: 4/5 Second time:  5/5 Third time:  5/5 Immediate memory score:  14/15  Months in reverse order:  1 Dec-Nov-Oct-Sep-Aug-Jul-Jun-May-Apr-Mar-Feb-Jan (Time <30-secs -- 1 point if no errors under 30-secs)   COORDINATION & BALANCE EXAM Modified BESS (20 seconds):  Double leg firm ground:  0 Single leg firm ground:  3 Tandem firm ground:  0 MBESS Total Score:  3/30  Assessment:   Concussion with brief loss of consciousness sustained on 11/29/23 after taking methocarbamol  in combination with gabapentin , trazodone , bupropion , striking head on counter while falling.   Postconcussion syndrome History of migraine headaches - on topamax  25mg  and emgality  History of bipolar disorder on bupropion  and sertraline, followed by psychiatry  Plan: 1.  Observation & Restrictions Work: Patient is greatly improved, now back to her baseline.  Letter given to return to work without restrictions.  School year is over.  She had the summer off.  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 Interventions:   - Has successfully completed PT, OT, speech therapy.  Should c                continue with home exercises as she is doing 3. Follow-up in as needed 5. Patient expressed understanding & agreement.  Encouraged to call with any questions or concerns  Encounter Diagnoses  Name Primary?   Concussion with loss of consciousness, subsequent encounter Yes   Postconcussive syndrome     No orders of the defined types were placed in this encounter.

## 2024-04-01 ENCOUNTER — Telehealth: Payer: Self-pay | Admitting: Pharmacy Technician

## 2024-04-01 ENCOUNTER — Other Ambulatory Visit (HOSPITAL_COMMUNITY): Payer: Self-pay

## 2024-04-01 NOTE — Telephone Encounter (Signed)
 Pharmacy Patient Advocate Encounter   Received notification from CoverMyMeds that prior authorization for EMGALITY  120MG is required/requested.   Insurance verification completed.   The patient is insured through [facility] .   Per test claim: PA required; PA submitted to above mentioned insurance via CoverMyMeds Key/confirmation #/EOC [id number] Status is pending

## 2024-04-02 ENCOUNTER — Other Ambulatory Visit (HOSPITAL_COMMUNITY): Payer: Self-pay

## 2024-04-02 NOTE — Telephone Encounter (Signed)
 Pharmacy Patient Advocate Encounter  Received notification from [facility] that Prior Authorization for EMGALITY  120MG  has been CANCELLED due to    Last filled at [facility] 7.25.25, next earliest fill is on 8.19.25.

## 2024-04-15 ENCOUNTER — Encounter (INDEPENDENT_AMBULATORY_CARE_PROVIDER_SITE_OTHER): Payer: Self-pay

## 2024-04-16 ENCOUNTER — Other Ambulatory Visit: Payer: Self-pay | Admitting: Medical Genetics

## 2024-05-20 ENCOUNTER — Encounter (INDEPENDENT_AMBULATORY_CARE_PROVIDER_SITE_OTHER): Payer: Self-pay | Admitting: *Deleted

## 2024-05-26 ENCOUNTER — Other Ambulatory Visit: Payer: Self-pay | Admitting: Neurology

## 2024-06-07 ENCOUNTER — Other Ambulatory Visit (HOSPITAL_COMMUNITY): Payer: Self-pay

## 2024-06-07 ENCOUNTER — Telehealth: Payer: Self-pay | Admitting: Pharmacist

## 2024-06-07 NOTE — Telephone Encounter (Signed)
 Clinical questions have been answered and PA submitted. PA currently Pending. Please be advised that most companies allow up to 30 days to make a decision. We will advise when a determination has been made, or follow up in 1 week.   Please reach out to our team, Rx Prior Auth Pool, if you haven't heard back in a week.

## 2024-06-07 NOTE — Telephone Encounter (Signed)
 Your request has been n/a Your PA request has been closed. Was clinical information received?: Yes Drug Name: Emgality  120mg /mL Pen Auto-Inj (galcanezumab -gnlm) Diagnosis: G43.019 - Migraine w/o aura, intractable, without status migrainosus Clinical Information/Required documentation: No PA required at this time. Please have the pharmacy process the request.

## 2024-06-07 NOTE — Telephone Encounter (Signed)
 Pharmacy Patient Advocate Encounter   Received notification from Patient Pharmacy that prior authorization for Emgality  120MG /ML auto-injectors (migraine) is required/requested.   Insurance verification completed.   The patient is insured through [facility] .   Per test claim: PA required; PA started via CoverMyMeds. KEY [id number] . Waiting for clinical questions to populate.

## 2024-06-14 NOTE — Progress Notes (Deleted)
 NEUROLOGY FOLLOW UP OFFICE NOTE  Theresa [name] [id number]  Assessment/Plan:   Migraine without aura, without status migrainosus, intractable Concussion Saddle anesthesia with bowel incontinence - unclear etiology   For migraine prevention:  Due to potential of cognitive side effects of topiramate  that may complicate her concussion recovery, will decrease dose back to 25mg  daily for now and start Emgality  (previously has been on beta blocker and Depakote) For migraine rescue:  Maxalt -MLT 10mg , Zofran  for nausea. Continue concussion care with PT/OT and Sports Medicine Follow up ***  Total time spent in chart, reviewing imaging and face to face with patient:  79 minutes  Subjective:  Theresa [name] is a 53 year old  right-handed female with chronic back pain, ADD, Bipolar disorder, depression, anxiety and history of bariatric surgery and transaminitis who follows up for migraine.   MRIs personally reviewed.  UPDATE: To evaluate saddle anesthesia, she had MRI LUMBAR SPINE WO on 12/17/2023 which showed:  1. Normal MRI appearance of the conus and cauda equina. No findings to explain patient's symptoms. 2. Left foraminal to extraforaminal disc protrusion at L3-4, potentially affecting the exiting left L3 nerve root. 3. Small biforaminal disc protrusions at L4-5 with resultant mild left lateral recess stenosis, with mild left greater than right L4 foraminal narrowing. 4. Degenerative spondylosis at L5-S1 with resultant mild bilateral subarticular stenosis, with mild bilateral L5 foraminal narrowing.  MRI C-SPINE WO on 12/19/2023 which demonstrated 1. Normal MRI appearance of the cervical spinal cord. 2. Degenerative disc osteophyte complex at C6-7 with resultant mild spinal stenosis, with mild to moderate left and mild right C7 foraminal narrowing. 3. Degenerative spondylosis at C5-6 with resultant borderline mild right C6 foraminal stenosis.  Decreased topiramate  to 25mg  and started  Emgality . *** Intensity:  *** Duration:  *** with Maxalt  Frequency:  *** Frequency of abortive medication: *** Current NSAIDS/analgesics:  meloxicam  (back pain) Current triptans:  Maxalt -MLT 10mg  Current ergotamine:  none Current anti-emetic:  Zofran  4mg  Current muscle relaxants:  methocarbamol  500mg  PRN Current Antihypertensive medications:  metoprolol  succinate ER 25mg , lisinopril  20mg  daily Current Antidepressant medications:  Sertraline 50mg  daily, Wellbutrin  SR 150mg  daily Current Anticonvulsant medications:  topiramate  50mg  daily (increased from 25mg  in January ), gabapentin  600mg /1200mg /600mg  Current anti-CGRP:  Emgality  Current Vitamins/Herbal/Supplements:  none Current Antihistamines/Decongestants:  none Other therapy:  none Other medications:  Xanax , trazodone  25mg  at bedtime     Caffeine:  none Alcohol:  no Smoker:  no Diet:  six 16 oz bottles of water  daily.  No soda.  Tries to avoid wheat and carbs (gastric bypass) Exercise:  walking, works on her farm, gardening Depression:  stable; Anxiety:  yes Other pain:  back pain Sleep hygiene:  cannot sleep in bed because laying her head aggravates pressure on her head, so she sleeps in a recliner.  Poor (4-5 )  HISTORY: Migraines: Onset:  Childhood  Did well on topiramate  25mg  daily for awhile.  Started becoming worse in late 2024. Location:  bilateral frontal, vertex Quality:  pressure Intensity:  severe.   Aura:  none Prodrome:  absent Associated symptoms:  dizziness/spinning, lightheadedness, tinnitus/ringing, head tingling, nausea, vomiting, photophobia, blurred vision/vision impairment due to dizziness.  She denies associated unilateral numbness or weakness. Duration:  few hours to 2-3 days Frequency:  twice a week (2-3 days a week total) Triggers:  unknown Relieving factors:  rest in dark room Activity:  aggravates. Due to worsening migraines, her PCP increased her topiramate  from 25mg  to 50mg  daily in January  2025.  01/24/2022 MRI BRAIN WO:  Scattered T2 hyperintense signal in the periventricular white matter, likely the sequela of mild chronic small vessel ischemic disease.  No acute intracranial process.  Concussion: On 11/29/2023, she had a fall and sustained a concussion.  She has chronic low back pain.  She has Robaxin  but doesn't usually take it as it causes drowsiness.  Her back was hurting that night, so she took one.  In the middle of the night, she was told by her husband that she got out of bed to go to the bathroom.  In the bathroom, she began to vomit and fell hitting the front of her head on the counter and back of her head on the toilet.  Her husband found her on the floor unconscious.  No reported seizure activity.  She has no recollection of this.  She declined going to the ED immediately.  However, she began having worsening daily migraines, so she went to the ED on 3/26 for further evaluation.   CT head showed no acute intracranial abnormality.    Since then, she has been having a near constant headache.  She has trouble collecting her thoughts and has difficulty speaking.  She has vertigo.  She has neck pain.  She has been seeing Sports Medicine for treatment of concussion and has been going to OT/speech therapy, PT and vestibular rehab.    She reports that since the fall, she has had saddle anesthesia and has some episodes of bowel incontinence.  When it occurred, she didn't feel the bowel movement.  Denies bilateral leg weakness.  She has history of low back pain and sciatica but currently denies radicular lumbar pain.  She does have IBS and history of gastric bypass and is followed by gastroenterology.     Past NSAIDS/analgesics:  Ibuprofen  800mg , Excedrin Migraine, naproxen  500mg , Tordadol inj, diclofenac  75mg , Stadol , Midrin Past abortive triptans:  sumatriptan Glidden/tab (discontinued due to elevated pressure) Past abortive ergotamine:  none Past muscle relaxants:  tizanidine  Past  anti-emetic:  promethazine  Past antihypertensive medications:  nebivolol , lisinopril , furosemide  Past antidepressant medications:  sertraline, mirtazapine Past anticonvulsant medications: Depakote, lamotrigine  Past anti-CGRP:  none Past vitamins/Herbal/Supplements:  none Past antihistamines/decongestants:  Flonase , Benadryl , Zyrtec Other past therapies:  none    Family history of migraines:  mom  PAST MEDICAL HISTORY: Past Medical History:  Diagnosis Date   Anemia    Anxiety    Attention deficit disorder    Bipolar disorder (HCC)    currently no meds- agitation noted during PAT assessment   Depression    GERD (gastroesophageal reflux disease)    resolved   H/O transfusion    17 yrs ago postpartum   Hyperlipidemia    resolved   Hypertension    history of HTN prior to gastric bypass   Hypoglycemia    Left breast abscess 01/22/2017   Sleep apnea, obstructive    cpap used auto settings, resolved gastric bypass    MEDICATIONS: Current Outpatient Medications on File Prior to Visit  Medication Sig Dispense Refill   ALPRAZolam  (XANAX ) 0.5 MG tablet Take 0.5 mg by mouth daily as needed for anxiety.     buPROPion  (WELLBUTRIN  SR) 150 MG 12 hr tablet TAKE 1 TABLET BY MOUTH TWICE A DAY (Patient taking differently: Take 150 mg by mouth daily.) 180 tablet 1   diclofenac  (VOLTAREN ) 75 MG EC tablet Take 1 tablet (75 mg total) by mouth 2 (two) times daily. 60 tablet 1   dicyclomine  (BENTYL ) 20 MG tablet Take  1/2 tablet (10 mg total) by mouth every 12 (twelve) hours as needed (abdominal pain). 90 tablet 1   esomeprazole  (NEXIUM ) 40 MG capsule Take 1 capsule (40 mg total) by mouth 2 (two) times daily before a meal. 30 capsule 11   estradiol  (ESTRACE ) 1 MG tablet Take 1 tablet (1 mg total) by mouth 2 (two) times daily. 180 tablet 0   gabapentin  (NEURONTIN ) 600 MG tablet TAKE 1 TABLET BY MOUTH IN THE MORNING TAKE 1 TABLET IN THE EVENING AND TAKE 2 TABLETS AT BEDTIME (Patient taking differently:  Take 600-1,200 mg by mouth See admin instructions. TAKE  600 MG BY MOUTH IN THE MORNING AND AT BEDTIME, 1200 MG AT LUNCHTIME) 120 tablet 3   Galcanezumab -gnlm (EMGALITY ) 120 MG/ML SOAJ Inject 120 mg into the skin every 28 (twenty-eight) days. 3 mL 2   lisinopril  (ZESTRIL ) 20 MG tablet Take 20 mg by mouth daily.     lubiprostone  (AMITIZA ) 24 MCG capsule Take 1 capsule (24 mcg total) by mouth 2 (two) times daily with a meal. 180 capsule 3   methocarbamol  (ROBAXIN ) 500 MG tablet Take 1 tablet (500 mg total) by mouth 3 (three) times daily. 60 tablet 1   metoprolol  succinate (TOPROL -XL) 25 MG 24 hr tablet TAKE 1 TABLET BY MOUTH EVERY DAY 90 tablet 0   ondansetron  (ZOFRAN ) 4 MG tablet Take 1 tablet (4 mg total) by mouth every 8 (eight) hours as needed for nausea or vomiting. 90 tablet 1   ondansetron  (ZOFRAN -ODT) 4 MG disintegrating tablet Take 1 tablet (4 mg total) by mouth every 8 (eight) hours as needed for nausea or vomiting. 20 tablet 0   rizatriptan  (MAXALT -MLT) 10 MG disintegrating tablet Take 1 tablet (10 mg total) by mouth as needed for migraine. May repeat in 2 hours if needed. Maximum 2 tablets in 24 hours. 10 tablet 5   sertraline (ZOLOFT) 50 MG tablet Take 50 mg by mouth daily.     topiramate  (TOPAMAX ) 25 MG tablet Take 1 tablet (25 mg total) by mouth at bedtime. 30 tablet 5   traZODone  (DESYREL ) 100 MG tablet Take 50 mg by mouth at bedtime.     No current facility-administered medications on file prior to visit.    ALLERGIES: Allergies  Allergen Reactions   Other Hives    Needs benadryl  when given narcotics    FAMILY HISTORY: Family History  Problem Relation Age of Onset   Neuropathy Mother    Migraines Mother    Dementia Mother    Hypertension Mother    Cancer Mother 59       breast   Diabetes Mother    Hyperlipidemia Mother    Thyroid  disease Mother    Breast cancer Mother 75   Depression Mother    Neuropathy Father    Hypertension Father    Diabetes Father    Heart  disease Father    Stroke Father    Depression Father    Bipolar disorder Father    Breast cancer Paternal Aunt    Cancer Maternal Grandmother        Breast   Hyperlipidemia Maternal Grandmother    Heart disease Maternal Grandmother    Stroke Maternal Grandmother    Hypertension Maternal Grandmother    Breast cancer Maternal Grandmother 45   Hyperlipidemia Maternal Grandfather    Hypertension Maternal Grandfather    Cancer Paternal Grandmother        Colon   Hyperlipidemia Paternal Grandmother    Heart disease Paternal Grandmother  Hyperlipidemia Paternal Grandfather    Hypertension Paternal Grandfather       Objective:  *** General: No acute distress.  Patient appears ***-groomed.   Head:  Normocephalic/atraumatic Eyes:  Fundi examined but not visualized Neck: supple, no paraspinal tenderness, full range of motion Heart:  Regular rate and rhythm Neurological Exam: alert and oriented.  Speech fluent and not dysarthric, language intact.  CN II-XII intact. Bulk and tone normal, muscle strength 5/5 throughout.  Sensation to light touch intact.  Deep tendon reflexes 2+ throughout, toes downgoing.  Finger to nose testing intact.  Gait normal, Romberg negative.   Juliene Dunnings, DO  CC: ***

## 2024-06-15 ENCOUNTER — Encounter: Payer: Self-pay | Admitting: Neurology

## 2024-06-15 ENCOUNTER — Ambulatory Visit: Admitting: Neurology

## 2024-06-23 ENCOUNTER — Encounter (INDEPENDENT_AMBULATORY_CARE_PROVIDER_SITE_OTHER): Payer: Self-pay | Admitting: Gastroenterology

## 2024-06-24 ENCOUNTER — Other Ambulatory Visit (HOSPITAL_COMMUNITY)
Admission: RE | Admit: 2024-06-24 | Discharge: 2024-06-24 | Disposition: A | Discharge status: Home / Self Care | Payer: Self-pay | Source: Ambulatory Visit | Attending: Oncology | Admitting: Oncology

## 2024-07-02 LAB — GENECONNECT MOLECULAR SCREEN: Genetic Analysis Overall Interpretation: NEGATIVE

## 2024-07-07 ENCOUNTER — Ambulatory Visit (INDEPENDENT_AMBULATORY_CARE_PROVIDER_SITE_OTHER): Admitting: Podiatry

## 2024-07-07 ENCOUNTER — Encounter: Payer: Self-pay | Admitting: Podiatry

## 2024-07-07 DIAGNOSIS — M79671 Pain in right foot: Secondary | ICD-10-CM

## 2024-07-07 DIAGNOSIS — M79672 Pain in left foot: Secondary | ICD-10-CM

## 2024-07-07 DIAGNOSIS — L600 Ingrowing nail: Secondary | ICD-10-CM | POA: Diagnosis not present

## 2024-07-07 MED ORDER — CEPHALEXIN 500 MG PO CAPS: 500.0000 mg | ORAL_CAPSULE | Freq: Four times a day (QID) | ORAL | 0 refills | Status: DC

## 2024-07-07 MED ORDER — CEPHALEXIN 500 MG PO CAPS
1000.0000 mg | ORAL_CAPSULE | Freq: Two times a day (BID) | ORAL | 0 refills | Status: AC
Start: 2024-07-07 — End: 2024-07-14

## 2024-07-07 NOTE — Progress Notes (Signed)
 Patient complains of painful ingrown both border(s) hallux bilaterally.  Open bothering her for a while but in the past few months been getting worse.  Has noticed some redness and some drainage at times.  Pain with walking and wearing shoes patient denies fevers, chills, nausea, vomiting.  Objective:  Vitals: Reviewed  General: Well developed, nourished, in no acute distress, alert and oriented x3   Vascular: DP pulse 2/4 bilateral. PT pulse 2/4 bilateral.  Minimal edema lower extremity bilaterally.  Capillary refill time immediate bilaterally  Dermatology: Erythema, edema, incurvated nail border hallux bilaterally with slight clear drainage . Tenderness present with palpation. Normal skin tone and texture feet with normal hair growth.  Neurological: Grossly intact. Normal reflexes.   Musculoskeletal: Tenderness with palpation of the distal hallux bilaterally. No tenderness or painful ROM at IPJ.  Diagnosis: 1.  Pain feet bilaterally 2.  Ingrown nail both borders hallux bilaterally  Plan: -New patient office visit for evaluation and management level 3.  Modifier 25. -discussed etiology and treatment of ingrown nails. Discussed surgical vs conservative treatment. -Consent signed for appropriate matrixectomy affected nail(s). -Rx: Keflex 500 mg 2 p.o. twice daily for 7 days  Procedure(s):   - Matrixectomy(s) nail borders hallux bilaterally: Each toe anesthetized with 3cc 2:1 mixture 2% Lidocaine  with epinephrine : Sodium Bicarbonate. Surgical site prepped. Digital tourniquet applied.  Avulsion of nail borders. performed. Matrixecomy performed with three 30 second applications of phenol to nail matrix. Site irrigated with alcohol.  Tourniquet released with good vascularity noticed in digit.  Applied triple antibiotic to nailbed and applied gauze and Coban dressing. - Written and oral postoperative instructions given.  -Return for post-op 2 weeks.  JINNY Prentice Binder, DPM

## 2024-07-07 NOTE — Patient Instructions (Signed)

## 2024-07-22 ENCOUNTER — Encounter: Payer: Self-pay | Admitting: Podiatry

## 2024-07-22 ENCOUNTER — Ambulatory Visit: Admitting: Podiatry

## 2024-07-22 DIAGNOSIS — L03031 Cellulitis of right toe: Secondary | ICD-10-CM | POA: Diagnosis not present

## 2024-07-22 DIAGNOSIS — L02611 Cutaneous abscess of right foot: Secondary | ICD-10-CM | POA: Diagnosis not present

## 2024-07-22 MED ORDER — CEPHALEXIN 500 MG PO CAPS: 1000.0000 mg | ORAL_CAPSULE | Freq: Two times a day (BID) | ORAL | 0 refills | Status: AC

## 2024-07-22 MED ORDER — NEOMYCIN-POLYMYXIN-HC 3.5-10000-1 OT SOLN: 3.0000 [drp] | Freq: Four times a day (QID) | OTIC | 0 refills | Status: AC

## 2024-07-22 NOTE — Progress Notes (Signed)
 Presents 2 weeks status post nail surgeries hallux bilaterally.  Doing well on the left however the right ones been painful and she is getting some redness and swelling along the lateral border proximal nail fold.  No fever or chills or nausea or vomiting.  Has had some clear drainage but no purulence noted.  Physical exam:  General appearance: Pleasant, and in no acute distress. AOx3.  Vascular: Pedal pulses: DP 2/4 bilaterally, PT 2/4 bilaterally.  Minimal edema lower legs bilaterally. Capillary fill time immediate bilaterally.  Neurological: Distally intact bilaterally  Dermatologic:   Surgery sites on the left hallux are healing normally with no signs of infection.  On the right hallux on the lateral border there is redness and swelling and some blistering at the proximal nail fold.  Skin normal temperature bilaterally.  Skin normal color, tone, and texture bilaterally.   Musculoskeletal:     Diagnosis: 1.  Cellulitis hallux right  Plan: -Established office visit for evaluation and management level 3. - Discussed with her the redness and tenderness she is having on the right hallux.  Could be infection developing however could also be inflammatory or hyper reaction to the phenol.  Will have her continue soaking it twice a day warm salt water .  Also give her prescription for Cortisporin otic solution drops to use on it after soaking. -Rx Cortisporin otic solution, 1 to 2 drops to the nail surgery site hallux right twice daily - Rx Keflex 500 mg, 2 p.o. twice daily for 7 days.  Nail surgery sites of the hallux right should be healed and painful to be resolved within 2 weeks.  If not call for appointment
